# Patient Record
Sex: Female | Born: 1945 | Race: Black or African American | Hispanic: No | Smoking: Former smoker
Health system: Southern US, Community
[De-identification: ages and names within clinical notes are randomized; demographics above are authoritative.]

## PROBLEM LIST (undated history)

## (undated) DIAGNOSIS — I10 Essential (primary) hypertension: Secondary | ICD-10-CM

## (undated) DIAGNOSIS — R001 Bradycardia, unspecified: Secondary | ICD-10-CM

## (undated) DIAGNOSIS — F32A Depression, unspecified: Secondary | ICD-10-CM

## (undated) DIAGNOSIS — D649 Anemia, unspecified: Secondary | ICD-10-CM

## (undated) DIAGNOSIS — N189 Chronic kidney disease, unspecified: Secondary | ICD-10-CM

## (undated) DIAGNOSIS — I509 Heart failure, unspecified: Secondary | ICD-10-CM

## (undated) DIAGNOSIS — I513 Intracardiac thrombosis, not elsewhere classified: Secondary | ICD-10-CM

## (undated) DIAGNOSIS — Z950 Presence of cardiac pacemaker: Secondary | ICD-10-CM

## (undated) DIAGNOSIS — E210 Primary hyperparathyroidism: Secondary | ICD-10-CM

## (undated) DIAGNOSIS — L97509 Non-pressure chronic ulcer of other part of unspecified foot with unspecified severity: Secondary | ICD-10-CM

## (undated) DIAGNOSIS — I4821 Permanent atrial fibrillation: Secondary | ICD-10-CM

## (undated) DIAGNOSIS — I639 Cerebral infarction, unspecified: Secondary | ICD-10-CM

## (undated) DIAGNOSIS — G459 Transient cerebral ischemic attack, unspecified: Secondary | ICD-10-CM

## (undated) DIAGNOSIS — Z8719 Personal history of other diseases of the digestive system: Secondary | ICD-10-CM

## (undated) DIAGNOSIS — I050 Rheumatic mitral stenosis: Secondary | ICD-10-CM

## (undated) DIAGNOSIS — K219 Gastro-esophageal reflux disease without esophagitis: Secondary | ICD-10-CM

## (undated) DIAGNOSIS — I739 Peripheral vascular disease, unspecified: Secondary | ICD-10-CM

## (undated) DIAGNOSIS — E11621 Type 2 diabetes mellitus with foot ulcer: Secondary | ICD-10-CM

## (undated) DIAGNOSIS — E7800 Pure hypercholesterolemia, unspecified: Secondary | ICD-10-CM

## (undated) DIAGNOSIS — E119 Type 2 diabetes mellitus without complications: Secondary | ICD-10-CM

## (undated) DIAGNOSIS — Z8711 Personal history of peptic ulcer disease: Secondary | ICD-10-CM

## (undated) DIAGNOSIS — Z9289 Personal history of other medical treatment: Secondary | ICD-10-CM

## (undated) DIAGNOSIS — F329 Major depressive disorder, single episode, unspecified: Secondary | ICD-10-CM

## (undated) DIAGNOSIS — B009 Herpesviral infection, unspecified: Secondary | ICD-10-CM

## (undated) DIAGNOSIS — I350 Nonrheumatic aortic (valve) stenosis: Secondary | ICD-10-CM

## (undated) HISTORY — DX: Anemia, unspecified: D64.9

## (undated) HISTORY — PX: HERNIA REPAIR: SHX51

## (undated) HISTORY — PX: DIALYSIS FISTULA CREATION: SHX611

## (undated) HISTORY — DX: Peripheral vascular disease, unspecified: I73.9

## (undated) HISTORY — DX: Primary hyperparathyroidism: E21.0

## (undated) HISTORY — PX: UMBILICAL HERNIA REPAIR: SHX196

## (undated) HISTORY — DX: Herpesviral infection, unspecified: B00.9

## (undated) HISTORY — DX: Nonrheumatic aortic (valve) stenosis: I35.0

## (undated) HISTORY — PX: INSERT / REPLACE / REMOVE PACEMAKER: SUR710

## (undated) HISTORY — DX: Depression, unspecified: F32.A

## (undated) HISTORY — PX: APPENDECTOMY: SHX54

## (undated) HISTORY — DX: Permanent atrial fibrillation: I48.21

## (undated) HISTORY — DX: Major depressive disorder, single episode, unspecified: F32.9

## (undated) HISTORY — DX: Bradycardia, unspecified: R00.1

## (undated) HISTORY — PX: TONSILLECTOMY: SUR1361

---

## 1998-01-23 ENCOUNTER — Other Ambulatory Visit: Admission: RE | Admit: 1998-01-23 | Discharge: 1998-01-23 | Payer: Self-pay | Admitting: *Deleted

## 1998-01-31 ENCOUNTER — Ambulatory Visit (HOSPITAL_COMMUNITY): Admission: RE | Admit: 1998-01-31 | Discharge: 1998-01-31 | Payer: Self-pay | Admitting: Nephrology

## 1998-02-04 ENCOUNTER — Other Ambulatory Visit: Admission: RE | Admit: 1998-02-04 | Discharge: 1998-02-04 | Payer: Self-pay | Admitting: Nephrology

## 1998-02-04 ENCOUNTER — Ambulatory Visit (HOSPITAL_COMMUNITY): Admission: RE | Admit: 1998-02-04 | Discharge: 1998-02-04 | Payer: Self-pay | Admitting: Nephrology

## 1998-02-11 ENCOUNTER — Ambulatory Visit (HOSPITAL_COMMUNITY): Admission: RE | Admit: 1998-02-11 | Discharge: 1998-02-11 | Payer: Self-pay | Admitting: Critical Care Medicine

## 1998-02-11 ENCOUNTER — Emergency Department (HOSPITAL_COMMUNITY): Admission: EM | Admit: 1998-02-11 | Discharge: 1998-02-11 | Payer: Self-pay | Admitting: Emergency Medicine

## 1998-03-21 ENCOUNTER — Ambulatory Visit (HOSPITAL_COMMUNITY): Admission: RE | Admit: 1998-03-21 | Discharge: 1998-03-21 | Payer: Self-pay | Admitting: Thoracic Surgery

## 1998-06-11 ENCOUNTER — Ambulatory Visit (HOSPITAL_COMMUNITY): Admission: RE | Admit: 1998-06-11 | Discharge: 1998-06-11 | Payer: Self-pay | Admitting: Nephrology

## 1998-06-19 ENCOUNTER — Encounter (HOSPITAL_COMMUNITY): Admission: RE | Admit: 1998-06-19 | Discharge: 1998-09-17 | Payer: Self-pay | Admitting: Nephrology

## 1998-08-07 HISTORY — PX: KIDNEY TRANSPLANT: SHX239

## 1998-08-20 ENCOUNTER — Inpatient Hospital Stay (HOSPITAL_COMMUNITY): Admission: EM | Admit: 1998-08-20 | Discharge: 1998-08-20 | Payer: Self-pay | Admitting: Emergency Medicine

## 1999-01-16 ENCOUNTER — Emergency Department (HOSPITAL_COMMUNITY): Admission: EM | Admit: 1999-01-16 | Discharge: 1999-01-16 | Payer: Self-pay | Admitting: Emergency Medicine

## 1999-04-16 ENCOUNTER — Encounter: Admission: RE | Admit: 1999-04-16 | Discharge: 1999-07-15 | Payer: Self-pay | Admitting: Orthopedic Surgery

## 1999-06-30 ENCOUNTER — Encounter: Payer: Self-pay | Admitting: Nephrology

## 1999-06-30 ENCOUNTER — Encounter: Admission: RE | Admit: 1999-06-30 | Discharge: 1999-06-30 | Payer: Self-pay | Admitting: Critical Care Medicine

## 1999-08-20 ENCOUNTER — Encounter: Payer: Self-pay | Admitting: Nephrology

## 1999-08-20 ENCOUNTER — Ambulatory Visit (HOSPITAL_COMMUNITY): Admission: RE | Admit: 1999-08-20 | Discharge: 1999-08-20 | Payer: Self-pay | Admitting: Critical Care Medicine

## 1999-12-18 ENCOUNTER — Emergency Department (HOSPITAL_COMMUNITY): Admission: EM | Admit: 1999-12-18 | Discharge: 1999-12-18 | Payer: Self-pay | Admitting: Emergency Medicine

## 1999-12-18 ENCOUNTER — Encounter: Payer: Self-pay | Admitting: Emergency Medicine

## 2000-06-29 ENCOUNTER — Encounter: Payer: Self-pay | Admitting: Nephrology

## 2000-06-29 ENCOUNTER — Encounter: Admission: RE | Admit: 2000-06-29 | Discharge: 2000-06-29 | Payer: Self-pay | Admitting: Nephrology

## 2000-10-17 ENCOUNTER — Emergency Department (HOSPITAL_COMMUNITY): Admission: EM | Admit: 2000-10-17 | Discharge: 2000-10-17 | Payer: Self-pay | Admitting: Emergency Medicine

## 2000-10-20 ENCOUNTER — Encounter: Admission: RE | Admit: 2000-10-20 | Discharge: 2001-01-18 | Payer: Self-pay | Admitting: Nephrology

## 2001-04-14 ENCOUNTER — Encounter: Admission: RE | Admit: 2001-04-14 | Discharge: 2001-04-14 | Payer: Self-pay | Admitting: Nephrology

## 2001-04-14 ENCOUNTER — Encounter: Payer: Self-pay | Admitting: Nephrology

## 2001-04-26 ENCOUNTER — Encounter: Admission: RE | Admit: 2001-04-26 | Discharge: 2001-04-26 | Payer: Self-pay | Admitting: Nephrology

## 2001-04-26 ENCOUNTER — Encounter: Payer: Self-pay | Admitting: Nephrology

## 2001-11-13 ENCOUNTER — Emergency Department (HOSPITAL_COMMUNITY): Admission: EM | Admit: 2001-11-13 | Discharge: 2001-11-13 | Payer: Self-pay | Admitting: Emergency Medicine

## 2001-11-13 ENCOUNTER — Encounter: Payer: Self-pay | Admitting: Emergency Medicine

## 2001-11-28 ENCOUNTER — Encounter: Admission: RE | Admit: 2001-11-28 | Discharge: 2001-11-28 | Payer: Self-pay | Admitting: Nephrology

## 2001-11-28 ENCOUNTER — Encounter: Payer: Self-pay | Admitting: Nephrology

## 2001-12-29 ENCOUNTER — Other Ambulatory Visit: Admission: RE | Admit: 2001-12-29 | Discharge: 2001-12-29 | Payer: Self-pay | Admitting: *Deleted

## 2002-11-27 ENCOUNTER — Encounter: Payer: Self-pay | Admitting: Nephrology

## 2002-11-27 ENCOUNTER — Encounter: Admission: RE | Admit: 2002-11-27 | Discharge: 2002-11-27 | Payer: Self-pay | Admitting: Nephrology

## 2002-12-26 ENCOUNTER — Encounter: Payer: Self-pay | Admitting: Emergency Medicine

## 2002-12-26 ENCOUNTER — Inpatient Hospital Stay (HOSPITAL_COMMUNITY): Admission: EM | Admit: 2002-12-26 | Discharge: 2002-12-27 | Payer: Self-pay | Admitting: Emergency Medicine

## 2003-09-20 ENCOUNTER — Inpatient Hospital Stay (HOSPITAL_COMMUNITY): Admission: EM | Admit: 2003-09-20 | Discharge: 2003-09-22 | Payer: Self-pay | Admitting: Emergency Medicine

## 2004-04-28 ENCOUNTER — Ambulatory Visit (HOSPITAL_COMMUNITY): Admission: RE | Admit: 2004-04-28 | Discharge: 2004-04-28 | Payer: Self-pay | Admitting: Nephrology

## 2004-06-05 ENCOUNTER — Encounter: Admission: RE | Admit: 2004-06-05 | Discharge: 2004-06-05 | Payer: Self-pay | Admitting: Otolaryngology

## 2004-06-12 ENCOUNTER — Encounter: Admission: RE | Admit: 2004-06-12 | Discharge: 2004-06-12 | Payer: Self-pay | Admitting: Nephrology

## 2005-02-23 ENCOUNTER — Encounter: Admission: RE | Admit: 2005-02-23 | Discharge: 2005-02-23 | Payer: Self-pay | Admitting: Nephrology

## 2005-07-23 ENCOUNTER — Ambulatory Visit (HOSPITAL_COMMUNITY): Admission: RE | Admit: 2005-07-23 | Discharge: 2005-07-23 | Payer: Self-pay | Admitting: Nephrology

## 2005-09-16 ENCOUNTER — Encounter: Admission: RE | Admit: 2005-09-16 | Discharge: 2005-09-16 | Payer: Self-pay | Admitting: Nephrology

## 2005-11-08 ENCOUNTER — Emergency Department (HOSPITAL_COMMUNITY): Admission: EM | Admit: 2005-11-08 | Discharge: 2005-11-08 | Payer: Self-pay | Admitting: Emergency Medicine

## 2006-01-06 ENCOUNTER — Encounter: Payer: Self-pay | Admitting: Emergency Medicine

## 2006-02-15 ENCOUNTER — Emergency Department (HOSPITAL_COMMUNITY): Admission: EM | Admit: 2006-02-15 | Discharge: 2006-02-15 | Payer: Self-pay | Admitting: Emergency Medicine

## 2006-05-13 ENCOUNTER — Encounter: Admission: RE | Admit: 2006-05-13 | Discharge: 2006-05-13 | Payer: Self-pay | Admitting: Nephrology

## 2006-08-02 ENCOUNTER — Ambulatory Visit (HOSPITAL_COMMUNITY): Admission: RE | Admit: 2006-08-02 | Discharge: 2006-08-02 | Payer: Self-pay | Admitting: Nephrology

## 2007-02-17 ENCOUNTER — Encounter: Admission: RE | Admit: 2007-02-17 | Discharge: 2007-02-17 | Payer: Self-pay | Admitting: Nephrology

## 2007-08-09 ENCOUNTER — Encounter: Admission: RE | Admit: 2007-08-09 | Discharge: 2007-08-09 | Payer: Self-pay | Admitting: Nephrology

## 2007-09-02 ENCOUNTER — Encounter: Admission: RE | Admit: 2007-09-02 | Discharge: 2007-09-02 | Payer: Self-pay | Admitting: Nephrology

## 2007-11-18 ENCOUNTER — Encounter: Admission: RE | Admit: 2007-11-18 | Discharge: 2007-11-18 | Payer: Self-pay | Admitting: Nephrology

## 2008-01-15 ENCOUNTER — Ambulatory Visit: Payer: Self-pay | Admitting: Vascular Surgery

## 2008-01-15 ENCOUNTER — Emergency Department (HOSPITAL_COMMUNITY): Admission: EM | Admit: 2008-01-15 | Discharge: 2008-01-15 | Payer: Self-pay | Admitting: Emergency Medicine

## 2008-01-15 ENCOUNTER — Encounter (INDEPENDENT_AMBULATORY_CARE_PROVIDER_SITE_OTHER): Payer: Self-pay | Admitting: Emergency Medicine

## 2008-04-27 ENCOUNTER — Emergency Department (HOSPITAL_COMMUNITY): Admission: EM | Admit: 2008-04-27 | Discharge: 2008-04-27 | Payer: Self-pay | Admitting: Emergency Medicine

## 2008-05-01 ENCOUNTER — Inpatient Hospital Stay (HOSPITAL_COMMUNITY): Admission: AD | Admit: 2008-05-01 | Discharge: 2008-05-09 | Payer: Self-pay | Admitting: Nephrology

## 2008-05-02 ENCOUNTER — Encounter (INDEPENDENT_AMBULATORY_CARE_PROVIDER_SITE_OTHER): Payer: Self-pay | Admitting: Nephrology

## 2008-05-04 ENCOUNTER — Encounter (INDEPENDENT_AMBULATORY_CARE_PROVIDER_SITE_OTHER): Payer: Self-pay | Admitting: Nephrology

## 2008-05-09 ENCOUNTER — Encounter (INDEPENDENT_AMBULATORY_CARE_PROVIDER_SITE_OTHER): Payer: Self-pay | Admitting: Nephrology

## 2008-06-26 ENCOUNTER — Ambulatory Visit: Payer: Self-pay | Admitting: Vascular Surgery

## 2008-06-28 ENCOUNTER — Ambulatory Visit: Payer: Self-pay | Admitting: Vascular Surgery

## 2008-07-03 ENCOUNTER — Ambulatory Visit: Payer: Self-pay | Admitting: Vascular Surgery

## 2008-07-06 ENCOUNTER — Ambulatory Visit (HOSPITAL_COMMUNITY): Admission: RE | Admit: 2008-07-06 | Discharge: 2008-07-06 | Payer: Self-pay | Admitting: Vascular Surgery

## 2008-07-06 ENCOUNTER — Ambulatory Visit: Payer: Self-pay | Admitting: Vascular Surgery

## 2008-07-10 ENCOUNTER — Ambulatory Visit: Payer: Self-pay | Admitting: Vascular Surgery

## 2008-08-16 ENCOUNTER — Ambulatory Visit (HOSPITAL_COMMUNITY): Admission: RE | Admit: 2008-08-16 | Discharge: 2008-08-16 | Payer: Self-pay | Admitting: *Deleted

## 2008-08-16 ENCOUNTER — Encounter (INDEPENDENT_AMBULATORY_CARE_PROVIDER_SITE_OTHER): Payer: Self-pay | Admitting: *Deleted

## 2008-10-16 ENCOUNTER — Encounter: Admission: RE | Admit: 2008-10-16 | Discharge: 2008-10-16 | Payer: Self-pay | Admitting: Nephrology

## 2008-11-12 ENCOUNTER — Encounter: Admission: RE | Admit: 2008-11-12 | Discharge: 2008-11-12 | Payer: Self-pay | Admitting: Nephrology

## 2008-12-04 ENCOUNTER — Encounter: Admission: RE | Admit: 2008-12-04 | Discharge: 2008-12-04 | Payer: Self-pay | Admitting: Podiatry

## 2008-12-07 ENCOUNTER — Ambulatory Visit (HOSPITAL_COMMUNITY): Admission: RE | Admit: 2008-12-07 | Discharge: 2008-12-07 | Payer: Self-pay | Admitting: Interventional Radiology

## 2008-12-20 ENCOUNTER — Ambulatory Visit (HOSPITAL_COMMUNITY): Admission: RE | Admit: 2008-12-20 | Discharge: 2008-12-20 | Payer: Self-pay | Admitting: Interventional Radiology

## 2009-01-09 ENCOUNTER — Encounter: Admission: RE | Admit: 2009-01-09 | Discharge: 2009-01-09 | Payer: Self-pay | Admitting: Interventional Radiology

## 2009-06-17 ENCOUNTER — Encounter: Admission: RE | Admit: 2009-06-17 | Discharge: 2009-06-17 | Payer: Self-pay | Admitting: Nephrology

## 2009-08-07 DIAGNOSIS — G459 Transient cerebral ischemic attack, unspecified: Secondary | ICD-10-CM

## 2009-08-07 HISTORY — DX: Transient cerebral ischemic attack, unspecified: G45.9

## 2009-08-18 ENCOUNTER — Inpatient Hospital Stay (HOSPITAL_COMMUNITY): Admission: EM | Admit: 2009-08-18 | Discharge: 2009-08-22 | Payer: Self-pay | Admitting: Emergency Medicine

## 2009-08-19 ENCOUNTER — Ambulatory Visit: Payer: Self-pay | Admitting: Vascular Surgery

## 2009-08-19 ENCOUNTER — Encounter (INDEPENDENT_AMBULATORY_CARE_PROVIDER_SITE_OTHER): Payer: Self-pay | Admitting: Internal Medicine

## 2009-10-21 ENCOUNTER — Encounter: Payer: Self-pay | Admitting: Internal Medicine

## 2010-05-07 ENCOUNTER — Ambulatory Visit: Payer: Self-pay | Admitting: *Deleted

## 2010-05-21 ENCOUNTER — Encounter: Admission: RE | Admit: 2010-05-21 | Discharge: 2010-05-21 | Payer: Self-pay | Admitting: Nephrology

## 2010-06-05 ENCOUNTER — Ambulatory Visit: Payer: Self-pay | Admitting: Cardiology

## 2010-06-14 ENCOUNTER — Encounter: Payer: Self-pay | Admitting: Internal Medicine

## 2010-06-19 ENCOUNTER — Ambulatory Visit: Payer: Self-pay | Admitting: Cardiology

## 2010-07-17 ENCOUNTER — Ambulatory Visit: Payer: Self-pay | Admitting: Cardiology

## 2010-07-30 ENCOUNTER — Ambulatory Visit: Payer: Self-pay | Admitting: Cardiology

## 2010-08-06 ENCOUNTER — Ambulatory Visit: Payer: Self-pay | Admitting: Internal Medicine

## 2010-08-06 DIAGNOSIS — F329 Major depressive disorder, single episode, unspecified: Secondary | ICD-10-CM

## 2010-08-06 DIAGNOSIS — I4891 Unspecified atrial fibrillation: Secondary | ICD-10-CM

## 2010-08-06 DIAGNOSIS — B009 Herpesviral infection, unspecified: Secondary | ICD-10-CM | POA: Insufficient documentation

## 2010-08-06 DIAGNOSIS — I519 Heart disease, unspecified: Secondary | ICD-10-CM

## 2010-08-06 DIAGNOSIS — I739 Peripheral vascular disease, unspecified: Secondary | ICD-10-CM

## 2010-08-06 DIAGNOSIS — I6350 Cerebral infarction due to unspecified occlusion or stenosis of unspecified cerebral artery: Secondary | ICD-10-CM

## 2010-08-06 DIAGNOSIS — Z950 Presence of cardiac pacemaker: Secondary | ICD-10-CM

## 2010-08-06 DIAGNOSIS — E210 Primary hyperparathyroidism: Secondary | ICD-10-CM

## 2010-08-06 DIAGNOSIS — I495 Sick sinus syndrome: Secondary | ICD-10-CM

## 2010-08-06 DIAGNOSIS — I050 Rheumatic mitral stenosis: Secondary | ICD-10-CM

## 2010-08-06 DIAGNOSIS — D649 Anemia, unspecified: Secondary | ICD-10-CM

## 2010-08-06 DIAGNOSIS — N186 End stage renal disease: Secondary | ICD-10-CM

## 2010-08-07 DIAGNOSIS — I639 Cerebral infarction, unspecified: Secondary | ICD-10-CM

## 2010-08-07 HISTORY — DX: Cerebral infarction, unspecified: I63.9

## 2010-09-10 ENCOUNTER — Ambulatory Visit: Payer: Self-pay | Admitting: Cardiology

## 2010-09-27 ENCOUNTER — Encounter: Payer: Self-pay | Admitting: Nephrology

## 2010-09-28 ENCOUNTER — Encounter: Payer: Self-pay | Admitting: Interventional Radiology

## 2010-09-28 ENCOUNTER — Encounter: Payer: Self-pay | Admitting: Podiatry

## 2010-09-28 ENCOUNTER — Encounter: Payer: Self-pay | Admitting: Nephrology

## 2010-09-29 ENCOUNTER — Encounter: Payer: Self-pay | Admitting: Nephrology

## 2010-10-07 NOTE — Letter (Signed)
Summary: [facility] Progress Note   [facility] Progress Note   Imported By: Roderic Ovens 08/11/2010 14:01:23  _____________________________________________________________________  External Attachment:    Type:   Image     Comment:   External Document

## 2010-10-07 NOTE — Cardiovascular Report (Signed)
Summary: Office Visit   Office Visit   Imported By: Roderic Ovens 08/12/2010 11:02:15  _____________________________________________________________________  External Attachment:    Type:   Image     Comment:   External Document

## 2010-10-07 NOTE — Assessment & Plan Note (Signed)
Summary: pacer check/medtronic   Visit Type:  Initial Consult Referring Provider:  Dr Deborah Chalk Primary Provider:  Elvis Coil, MD   History of Present Illness: Ms Tatlock is a pleasant 65 yo AAF with a h/o valvular heart disease, CRI, and permanent atrial fibrillation s/p PPM for symptomatic bradycardia who presents to establish EP care.  She has a longstanding h/o valvular heart disease for which she has been followed by Dr Deborah Chalk.  She presented 12/10 following a stroke and then underwent PPM for symptomatic bradycardia with pauses upto 3 seconds.  She has done well since that time.  She is felt to not be a surgical candidate for her valvular heart disease.Presently, she denies symptoms of palpitations, chest pain, shortness of breath, orthopnea, PND, lower extremity edema, dizziness, presyncope, syncope, or neurologic sequela. The patient is tolerating medications without difficulties and is otherwise without complaint today.   Current Medications (verified): 1)  Nearol 25 Mg .... Two Times A Day 2)  Cellcept 250 Mg Caps (Mycophenolate Mofetil) .... Two Times A Day 3)  Prednisone 5 Mg Tabs (Prednisone) .... Uad 4)  Levemir 100 Unit/ml Soln (Insulin Detemir) .... Uad 5)  Mil-A-Mulsion 10-15000 Unit/drop Emul (Multiple Vitamin) .... Uad 6)  Caltrate .... Once Daily 7)  Furosemide 40 Mg Tabs (Furosemide) .... As Needed 8)  Januvia 100 Mg Tabs (Sitagliptin Phosphate) .... Once Daily 9)  Digoxin 0.25 Mg Tabs (Digoxin) .... Once Daily 10)  Warfarin Sodium 5 Mg Tabs (Warfarin Sodium) .... Use As Directed By Anticoagulation Clinic 11)  Metoprolol Tartrate 50 Mg Tabs (Metoprolol Tartrate) .... Take One Tablet By Mouth Three Times A Day  Allergies (verified): 1)  ! 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   Vital Signs:  Patient profile:   65 year old female Height:      60 inches Weight:      99 pounds BMI:     19.40 Pulse rate:   74 / minute BP sitting:   120 / 88  Vitals Entered By: Laurance Flatten CMA (August 06, 2010 9:19 AM)  Physical Exam  General:  thin and chronically ill Head:  normocephalic and atraumatic Eyes:  PERRLA/EOM intact; conjunctiva and lids normal. Mouth:  Teeth, gums and palate normal. Oral mucosa normal. Neck:  supple Chest Wall:  R sided pacemaker site is well healed Lungs:  Clear bilaterally to auscultation and percussion. Heart:  RRR (Paced) Abdomen:  Bowel sounds positive; abdomen soft and non-tender without masses, organomegaly, or hernias noted. No hepatosplenomegaly. Msk:  Back normal, normal gait. Muscle strength and tone normal. Pulses:  pulses normal in all 4 extremities Extremities:  No clubbing or cyanosis. Neurologic:  Alert and oriented x 3.   PPM Specifications Following MD:  Hillis Range, MD     Referring MD:  Rolla Plate PPM Vendor:  Medtronic     PPM Model Number:  [id number]     PPM Serial Number:  [id number] PPM DOI:  08/20/2009  PPM Implanting MD:  Roger Shelter, MD  Lead 1    Location: RV     DOI: 08/20/2009     Model #: [id number]     Serial #: [id number]     Status: active  Magnet Response Rate:  BOL 85 ERI 65  Indications:  A-fib   PPM Follow Up Battery Voltage:  2.79 V     Battery Est. Longevity:  10 yrs     Right Ventricle  Amplitude: 22.4 mV, Impedance: 586 ohms, Threshold: 0.750 V at 0.40  msec  Episodes Ventricular High Rate:  8234     Ventricular Pacing:  37%  Parameters Mode:  VVIR     Lower Rate Limit:  50     Upper Rate Limit:  130 Next Remote Date:  11/06/2010     Next Cardiology Appt Due:  07/09/2011 Tech Comments:  [place] [facility] PT---8234 VHR EPISODES--LONGEST WAS 2 MINUTES 1 SECOND.  NORMAL DEVICE FUNCTION.  NO CHANGES MADE. PT TO BE ENROLLED IN CARELINK.  CARELINK TRANSMISSION 11-06-10 AND ROV IN 12 MTHS W/JA. Vella Kohler  August 06, 2010 9:24 AM MD Comments:  agree  Impression & Recommendations:  Problem # 1:  ATRIAL FIBRILLATION (ICD-427.31) Ms Volkert has permanent valvular atrial fibrillation.  She is doing well s/p PPM. She is appropriately anticoagulated with coumadin. Today, she inquires about pradaxa or xeralto.  I have informed her that with valvular heart disease, particularly severe MS, she is not a candidate for these new medicines at this time.  NO changes today  Problem # 2:  BRADYCARDIA-TACHYCARDIA SYNDROME (ICD-427.81) normal pacemaker function as above  Problem # 3:  MITRAL STENOSIS (ICD-394.0) per Dr Deborah Chalk, she is not a surgical candidate presently  Problem # 4:  DIASTOLIC DYSFUNCTION (ICD-429.9) stable  Patient Instructions: 1)  Your physician wants you to follow-up in:  12 months with Dr Jacquiline Doe will receive a reminder letter in the mail two months in advance. If you don't receive a letter, please call our office to schedule the follow-up appointment. 2)  Set up Carelinks at home

## 2010-10-07 NOTE — Miscellaneous (Signed)
Summary: Device preload  Clinical Lists Changes  Observations: Added new observation of PPM INDICATN: A-fib (06/14/2010 16:01) Added new observation of MAGNET RTE: BOL 85 ERI 65 (06/14/2010 16:01) Added new observation of PPMLEADSTAT1: active (06/14/2010 16:01) Added new observation of PPMLEADSER1: [id number]  (06/14/2010 16:01) Added new observation of PPMLEADMOD1: 4470  (06/14/2010 16:01) Added new observation of PPMLEADDOI1: 08/20/2009  (06/14/2010 16:01) Added new observation of PPMLEADLOC1: RV  (06/14/2010 16:01) Added new observation of PPM IMP MD: Roger Shelter, MD  (06/14/2010 16:01) Added new observation of PPM DOI: 08/20/2009  (06/14/2010 16:01) Added new observation of PPM SERL#: [id number]  (06/14/2010 16:01) Added new observation of PPM MODL#: [id number]  (06/14/2010 41:32) Added new observation of PACEMAKERMFG: Medtronic  (06/14/2010 16:01) Added new observation of PPM REFER MD: Duffy Rhody Tennant,MD  (06/14/2010 16:01) Added new observation of PACEMAKER MD: Hillis Range, MD  (06/14/2010 16:01)      PPM Specifications Following MD:  Hillis Range, MD     Referring MD:  Rolla Plate PPM Vendor:  Medtronic     PPM Model Number:  [id number]     PPM Serial Number:  [id number] H PPM DOI:  08/20/2009     PPM Implanting MD:  Roger Shelter, MD  Lead 1    Location: RV     DOI: 08/20/2009     Model #: [id number]     Serial #: [id number]     Status: active  Magnet Response Rate:  BOL 85 ERI 65  Indications:  A-fib

## 2010-10-09 ENCOUNTER — Ambulatory Visit (INDEPENDENT_AMBULATORY_CARE_PROVIDER_SITE_OTHER): Payer: Medicare Other | Admitting: Cardiology

## 2010-10-09 DIAGNOSIS — Z7901 Long term (current) use of anticoagulants: Secondary | ICD-10-CM

## 2010-10-09 DIAGNOSIS — I4891 Unspecified atrial fibrillation: Secondary | ICD-10-CM

## 2010-10-24 ENCOUNTER — Other Ambulatory Visit: Payer: Medicare Other

## 2010-10-27 ENCOUNTER — Other Ambulatory Visit: Payer: Medicare Other

## 2010-10-28 ENCOUNTER — Encounter (INDEPENDENT_AMBULATORY_CARE_PROVIDER_SITE_OTHER): Payer: Medicare Other

## 2010-10-28 DIAGNOSIS — Z7901 Long term (current) use of anticoagulants: Secondary | ICD-10-CM

## 2010-10-28 DIAGNOSIS — I4891 Unspecified atrial fibrillation: Secondary | ICD-10-CM

## 2010-11-04 ENCOUNTER — Other Ambulatory Visit: Payer: Self-pay | Admitting: Nephrology

## 2010-11-04 ENCOUNTER — Ambulatory Visit
Admission: RE | Admit: 2010-11-04 | Discharge: 2010-11-04 | Disposition: A | Discharge status: Home / Self Care | Payer: Medicare Other | Source: Ambulatory Visit | Attending: Nephrology | Admitting: Nephrology

## 2010-11-04 DIAGNOSIS — R05 Cough: Secondary | ICD-10-CM

## 2010-11-07 ENCOUNTER — Other Ambulatory Visit: Payer: Medicare Other

## 2010-11-09 ENCOUNTER — Encounter (INDEPENDENT_AMBULATORY_CARE_PROVIDER_SITE_OTHER): Payer: Self-pay | Admitting: *Deleted

## 2010-11-18 NOTE — Letter (Signed)
Summary: Device-Delinquent Phone Journalist, newspaper, Main Office  [address]   [place], [place] [address]   Phone: [phone]  Fax: [phone]     November 09, 2010 MRN: [id number]   [name] [name] [address] [facility] [address] [place], [place]  [address]   Dear [name] [name],  According to our records, you were scheduled for a device phone transmission on 11-06-10.     We did not receive any results from this check.  If you transmitted on your scheduled day, please call us to help troubleshoot your system.  If you forgot to send your transmission, please send one upon receipt of this letter.  Thank you,   Architectural technologist [facility]

## 2010-11-20 ENCOUNTER — Other Ambulatory Visit: Payer: Self-pay | Admitting: Family Medicine

## 2010-11-20 DIAGNOSIS — Z780 Asymptomatic menopausal state: Secondary | ICD-10-CM

## 2010-11-25 ENCOUNTER — Other Ambulatory Visit: Payer: Medicare Other

## 2010-12-02 ENCOUNTER — Ambulatory Visit (INDEPENDENT_AMBULATORY_CARE_PROVIDER_SITE_OTHER): Payer: Medicare Other | Admitting: *Deleted

## 2010-12-02 DIAGNOSIS — Z7901 Long term (current) use of anticoagulants: Secondary | ICD-10-CM

## 2010-12-02 DIAGNOSIS — I4891 Unspecified atrial fibrillation: Secondary | ICD-10-CM

## 2010-12-03 ENCOUNTER — Ambulatory Visit
Admission: RE | Admit: 2010-12-03 | Discharge: 2010-12-03 | Disposition: A | Discharge status: Home / Self Care | Payer: Medicare Other | Source: Ambulatory Visit | Attending: Family Medicine | Admitting: Family Medicine

## 2010-12-03 DIAGNOSIS — Z780 Asymptomatic menopausal state: Secondary | ICD-10-CM

## 2010-12-04 ENCOUNTER — Ambulatory Visit: Payer: Medicare Other | Admitting: Dietician

## 2010-12-08 LAB — PROTIME-INR
INR: 1.94 — ABNORMAL HIGH (ref 0.00–1.49)
Prothrombin Time: 22 seconds — ABNORMAL HIGH (ref 11.6–15.2)

## 2010-12-08 LAB — BASIC METABOLIC PANEL
BUN: 25 mg/dL — ABNORMAL HIGH (ref 6–23)
CO2: 24 mEq/L (ref 19–32)
Calcium: 8.8 mg/dL (ref 8.4–10.5)
Creatinine, Ser: 1.09 mg/dL (ref 0.4–1.2)
Glucose, Bld: 104 mg/dL — ABNORMAL HIGH (ref 70–99)

## 2010-12-08 LAB — CBC
MCHC: 33.5 g/dL (ref 30.0–36.0)
Platelets: 179 10*3/uL (ref 150–400)
RDW: 16.5 % — ABNORMAL HIGH (ref 11.5–15.5)

## 2010-12-09 LAB — LIPID PANEL
Cholesterol: 221 mg/dL — ABNORMAL HIGH (ref 0–200)
LDL Cholesterol: 91 mg/dL (ref 0–99)
Total CHOL/HDL Ratio: 2 RATIO
VLDL: 17 mg/dL (ref 0–40)

## 2010-12-09 LAB — PROTIME-INR
INR: 1.34 (ref 0.00–1.49)
INR: 1.43 (ref 0.00–1.49)
INR: 1.83 — ABNORMAL HIGH (ref 0.00–1.49)
Prothrombin Time: 16.5 seconds — ABNORMAL HIGH (ref 11.6–15.2)
Prothrombin Time: 19.8 seconds — ABNORMAL HIGH (ref 11.6–15.2)
Prothrombin Time: 21 seconds — ABNORMAL HIGH (ref 11.6–15.2)

## 2010-12-09 LAB — GLUCOSE, CAPILLARY
Glucose-Capillary: 110 mg/dL — ABNORMAL HIGH (ref 70–99)
Glucose-Capillary: 120 mg/dL — ABNORMAL HIGH (ref 70–99)
Glucose-Capillary: 122 mg/dL — ABNORMAL HIGH (ref 70–99)
Glucose-Capillary: 132 mg/dL — ABNORMAL HIGH (ref 70–99)
Glucose-Capillary: 148 mg/dL — ABNORMAL HIGH (ref 70–99)
Glucose-Capillary: 163 mg/dL — ABNORMAL HIGH (ref 70–99)
Glucose-Capillary: 222 mg/dL — ABNORMAL HIGH (ref 70–99)
Glucose-Capillary: 93 mg/dL (ref 70–99)
Glucose-Capillary: 93 mg/dL (ref 70–99)

## 2010-12-09 LAB — CBC
HCT: 36.4 % (ref 36.0–46.0)
HCT: 37.3 % (ref 36.0–46.0)
Hemoglobin: 12.3 g/dL (ref 12.0–15.0)
Hemoglobin: 12.8 g/dL (ref 12.0–15.0)
Hemoglobin: 13.8 g/dL (ref 12.0–15.0)
MCHC: 33.5 g/dL (ref 30.0–36.0)
MCHC: 33.9 g/dL (ref 30.0–36.0)
MCV: 102.3 fL — ABNORMAL HIGH (ref 78.0–100.0)
MCV: 102.4 fL — ABNORMAL HIGH (ref 78.0–100.0)
Platelets: 170 10*3/uL (ref 150–400)
RBC: 3.55 MIL/uL — ABNORMAL LOW (ref 3.87–5.11)
RBC: 3.62 MIL/uL — ABNORMAL LOW (ref 3.87–5.11)
RBC: 3.68 MIL/uL — ABNORMAL LOW (ref 3.87–5.11)
RBC: 4.03 MIL/uL (ref 3.87–5.11)
WBC: 4.7 10*3/uL (ref 4.0–10.5)

## 2010-12-09 LAB — URINALYSIS, MICROSCOPIC ONLY
Glucose, UA: NEGATIVE mg/dL
Hgb urine dipstick: NEGATIVE
Protein, ur: NEGATIVE mg/dL
Specific Gravity, Urine: 1.017 (ref 1.005–1.030)
Urobilinogen, UA: 0.2 mg/dL (ref 0.0–1.0)

## 2010-12-09 LAB — DIFFERENTIAL
Basophils Absolute: 0 10*3/uL (ref 0.0–0.1)
Eosinophils Absolute: 0.1 10*3/uL (ref 0.0–0.7)
Lymphs Abs: 2.3 10*3/uL (ref 0.7–4.0)
Neutrophils Relative %: 55 % (ref 43–77)

## 2010-12-09 LAB — TROPONIN I: Troponin I: 0.04 ng/mL (ref 0.00–0.06)

## 2010-12-09 LAB — BASIC METABOLIC PANEL
BUN: 27 mg/dL — ABNORMAL HIGH (ref 6–23)
CO2: 25 mEq/L (ref 19–32)
Calcium: 9.2 mg/dL (ref 8.4–10.5)
Chloride: 111 mEq/L (ref 96–112)
Creatinine, Ser: 1.1 mg/dL (ref 0.4–1.2)
Creatinine, Ser: 1.25 mg/dL — ABNORMAL HIGH (ref 0.4–1.2)
GFR calc Af Amer: 53 mL/min — ABNORMAL LOW (ref >60)
GFR calc Af Amer: >60 mL/min (ref >60)
Potassium: 4 mEq/L (ref 3.5–5.1)

## 2010-12-09 LAB — COMPREHENSIVE METABOLIC PANEL
ALT: 26 U/L (ref 0–35)
CO2: 27 mEq/L (ref 19–32)
Calcium: 10.2 mg/dL (ref 8.4–10.5)
Chloride: 105 mEq/L (ref 96–112)
Creatinine, Ser: 1.41 mg/dL — ABNORMAL HIGH (ref 0.4–1.2)
GFR calc non Af Amer: 38 mL/min — ABNORMAL LOW (ref >60)
Glucose, Bld: 57 mg/dL — ABNORMAL LOW (ref 70–99)
Sodium: 143 mEq/L (ref 135–145)
Total Bilirubin: 1.1 mg/dL (ref 0.3–1.2)

## 2010-12-09 LAB — CK TOTAL AND CKMB (NOT AT ARMC)
CK, MB: 1.4 ng/mL (ref 0.3–4.0)
Total CK: 83 U/L (ref 7–177)

## 2010-12-09 LAB — CARDIAC PANEL(CRET KIN+CKTOT+MB+TROPI)
CK, MB: 1.5 ng/mL (ref 0.3–4.0)
Relative Index: 1.3 (ref 0.0–2.5)
Relative Index: INVALID (ref 0.0–2.5)
Total CK: 112 U/L (ref 7–177)
Troponin I: 0.02 ng/mL (ref 0.00–0.06)

## 2010-12-09 LAB — POCT CARDIAC MARKERS: Troponin i, poc: <0.05 ng/mL (ref 0.00–0.09)

## 2010-12-09 LAB — POCT I-STAT, CHEM 8
Calcium, Ion: 1.12 mmol/L (ref 1.12–1.32)
Chloride: 109 mEq/L (ref 96–112)
Glucose, Bld: 57 mg/dL — ABNORMAL LOW (ref 70–99)
HCT: 40 % (ref 36.0–46.0)
Hemoglobin: 13.6 g/dL (ref 12.0–15.0)
TCO2: 26 mmol/L (ref 0–100)

## 2010-12-09 LAB — VITAMIN B12: Vitamin B-12: 532 pg/mL (ref 211–911)

## 2010-12-15 ENCOUNTER — Ambulatory Visit (INDEPENDENT_AMBULATORY_CARE_PROVIDER_SITE_OTHER): Payer: Medicare Other | Admitting: *Deleted

## 2010-12-15 DIAGNOSIS — Z7901 Long term (current) use of anticoagulants: Secondary | ICD-10-CM

## 2010-12-15 DIAGNOSIS — I4891 Unspecified atrial fibrillation: Secondary | ICD-10-CM

## 2010-12-15 LAB — POCT INR: INR: 2.2

## 2010-12-16 ENCOUNTER — Encounter: Payer: Medicare Other | Admitting: *Deleted

## 2010-12-17 LAB — CBC
HCT: 36.8 % (ref 36.0–46.0)
MCHC: 33.5 g/dL (ref 30.0–36.0)
MCV: 99.9 fL (ref 78.0–100.0)
RBC: 3.69 MIL/uL — ABNORMAL LOW (ref 3.87–5.11)
WBC: 5.2 10*3/uL (ref 4.0–10.5)

## 2010-12-17 LAB — GLUCOSE, CAPILLARY
Glucose-Capillary: 107 mg/dL — ABNORMAL HIGH (ref 70–99)
Glucose-Capillary: 37 mg/dL — CL (ref 70–99)
Glucose-Capillary: 58 mg/dL — ABNORMAL LOW (ref 70–99)
Glucose-Capillary: 67 mg/dL — ABNORMAL LOW (ref 70–99)

## 2010-12-17 LAB — BASIC METABOLIC PANEL
BUN: 37 mg/dL — ABNORMAL HIGH (ref 6–23)
CO2: 28 mEq/L (ref 19–32)
Chloride: 105 mEq/L (ref 96–112)
Creatinine, Ser: 1.64 mg/dL — ABNORMAL HIGH (ref 0.4–1.2)
GFR calc Af Amer: 39 mL/min — ABNORMAL LOW (ref >60)
Potassium: 3.3 mEq/L — ABNORMAL LOW (ref 3.5–5.1)

## 2010-12-17 LAB — PROTIME-INR: Prothrombin Time: 19.9 seconds — ABNORMAL HIGH (ref 11.6–15.2)

## 2010-12-22 ENCOUNTER — Other Ambulatory Visit: Payer: Self-pay | Admitting: Cardiology

## 2010-12-22 DIAGNOSIS — I4891 Unspecified atrial fibrillation: Secondary | ICD-10-CM

## 2010-12-22 DIAGNOSIS — I509 Heart failure, unspecified: Secondary | ICD-10-CM

## 2010-12-30 ENCOUNTER — Ambulatory Visit (INDEPENDENT_AMBULATORY_CARE_PROVIDER_SITE_OTHER): Payer: Medicare Other | Admitting: *Deleted

## 2010-12-30 ENCOUNTER — Other Ambulatory Visit: Payer: Self-pay | Admitting: Internal Medicine

## 2010-12-30 DIAGNOSIS — Z950 Presence of cardiac pacemaker: Secondary | ICD-10-CM

## 2010-12-30 DIAGNOSIS — I4891 Unspecified atrial fibrillation: Secondary | ICD-10-CM

## 2011-01-11 NOTE — Progress Notes (Signed)
Pacer remote check  

## 2011-01-12 ENCOUNTER — Encounter: Payer: Medicare Other | Admitting: *Deleted

## 2011-01-13 ENCOUNTER — Ambulatory Visit (INDEPENDENT_AMBULATORY_CARE_PROVIDER_SITE_OTHER): Payer: Medicare Other | Admitting: *Deleted

## 2011-01-13 DIAGNOSIS — I4891 Unspecified atrial fibrillation: Secondary | ICD-10-CM

## 2011-01-13 DIAGNOSIS — Z7901 Long term (current) use of anticoagulants: Secondary | ICD-10-CM

## 2011-01-14 ENCOUNTER — Encounter: Payer: Self-pay | Admitting: *Deleted

## 2011-01-20 NOTE — Consult Note (Signed)
NAMEFIONA, [name] NO.:  [id number]   MEDICAL RECORD NO.:  [id number]          PATIENT TYPE:  INP   LOCATION:  6739                         FACILITY:  [facility]   PHYSICIAN:  Colleen Can. Deborah Chalk, M.D.DATE OF BIRTH:  November 18, 1945   DATE OF CONSULTATION:  05/01/2008  DATE OF DISCHARGE:                                 CONSULTATION   Thank you very much for asking me to see [name] for evaluation of  heart failure and atrial fibrillation.  She had a recent admission and  emergency room visit for atrial fibrillation and bilateral lower  extremity edema.  She has not felt well for week, but has also had  diarrhea, chills, and poor intake.  She has had no actual fever.  She  saw Dr. Hyman Hopes today who was admitted for further evaluation.  She has  problems with diabetes, atrial fibrillation, peripheral vascular  disease, and had a history of a renal transplant in 1999.  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   Her past medical history includes allergies to CODEINE and ASPIRIN.   CURRENT MEDS:  Aspirin, Lantus insulin, Tylenol, vitamins, Januvia 100  mg a day, sertraline 25 mg a day, Actos 30 mg a day, Valtrex, CellCept,  and prednisone.   FAMILY HISTORY:  Father died in his 79s of cancer.  Mother died in her  45s of cancer.   SOCIAL HISTORY:  Lives alone.  She has 2 daughters.  No tobacco.  No  alcohol.   REVIEW OF SYSTEMS:  Recently seen last week in the ER because of edema,  noted to be in atrial fibrillation.  She was started on Cartia at that  time.  She has had fatigue.  She has had chills and diarrhea.  She has  also had shortness of breath, PND, and orthopnea.  She has had cramps.  No vomiting.  She has also had foot pain.   On exam, in general, she is frail small woman, is 88.  She was sitting  upright in the room.  Blood pressure was  110/70.  Heart rate was 110 and  it was irregularly irregular.  Her skin was warm and dry.  Color was  normal.  There was definite increase in jugular venous distention.  No  prominent V waves were noted.  Lungs were reasonably clear.  Heart  showed an irregularly irregular rhythm.  There was a systolic apical  murmur.  S1 and S2 appeared to be normal in intensity.  Abdomen was  soft.  No masses.  There was positive hepatojugular reflux.  Lower  extremities showed 1+ edema.  Neurologically, she was intact.   Current lab is pending.   OVERALL IMPRESSION:  1. Atrial fibrillation, new.  2. Congestive heart failure with elevated jugular venous distention      out of proportion to the pulmonary findings.  3. Diabetes.  4. History of renal transplant.  5. Questionable diarrhea and flu-like illness.   PLAN:  We will check a  2-D echocardiogram.  We will check BNP levels.  We will try for rate control, diuresis, and anticoagulation.  She will  have contact isolation for now, but I think we need to continue to  evaluate her because of her symptoms that are out of proportion to what  one would expect given her history.  She does not have a history of  rheumatic fever and had no significant valvular disease in 2004 at the  time of her atrial fibrillation.      Colleen Can. Deborah Chalk, M.D.  Electronically Signed     SNT/MEDQ  D:  05/04/2008  T:  05/04/2008  Job:  [id number]   cc:   Garnetta Buddy, M.D.

## 2011-01-20 NOTE — Assessment & Plan Note (Signed)
OFFICE VISIT   CLYTEE, HEINRICH  DOB:  1945/09/24                                       07/10/2008  ZOXWR#:[id number]   The patient returns today to discuss findings of her right femoral  angiogram done last week by Dr. Darrick Penna.  She has been having drawing  sensation in her right foot as well as chronic numbness in the right  foot.  She does not really describe pain in the foot but does have  severe claudication symptoms.  She has no history of nonhealing ulcers  or infection.  She does have a functioning renal transplant.   Her right foot on physical exam continues to be very slightly cooler  than the left with slightly diminished sensation compared to the left.  She has palpable femoral pulses bilaterally at 3+ with difficult  popliteal and distal pulses in both sides.  Angiogram reveals the right  common profunda and superficial femoral arteries to be patent although  there is one focal lesion proximally in the right superficial femoral  artery.  Popliteal artery is patent across the knee joint with diseased  tibial vessels as one would expect, anterior tibial artery appears to be  patent to the ankle without complete obstruction.   I am still uncertain that her symptoms are due to her vascular disease.  The only thing to offer would be PTA and stenting of her right  superficial femoral artery but I do not think this will eliminate  chronic numbness in the right foot which seems to be more of a  neuropathy type problem.  I discussed this with her if her symptoms  worsen or if she develops any infection or ulceration the patient should  be in touch with [place] for [place] to consider that procedure.  Otherwise she  will return to see [place] on a p.r.n. basis.  She is back on her Coumadin  for her valve disease which is followed by Dr. Colleen Can. Tennant.   Quita Skye Hart [place], M.D.  Electronically Signed   JDL/MEDQ  D:  07/10/2008  T:  07/11/2008  Job:  [id number]   cc:    Colleen Can. Deborah Chalk, M.D.

## 2011-01-20 NOTE — Consult Note (Signed)
NAMETABITHIA, STRODER NO.:  [id number]   MEDICAL RECORD NO.:  [id number]          PATIENT TYPE:  EMS   LOCATION:  MAJO                         FACILITY:  [facility]   PHYSICIAN:  Maree Krabbe, M.D.DATE OF BIRTH:  September 30, 1945   DATE OF CONSULTATION:  DATE OF DISCHARGE:  04/27/2008                                 CONSULTATION   CHIEF COMPLAINT:  Swelling.   HISTORY OF PRESENT ILLNESS:  The patient is a 65 year old African  American woman, status post kidney transplant done in 1999 at [facility].  The patient has been on immunosuppressants  and has been off hemodialysis.  The patient came to ED, apparently  referred by Dr. Hyman Hopes, with one day history of bilateral lower extremity  swelling.  The patient denies having noticed swelling anywhere else, for  example on her abdomen or periorbital region.  She denies any weight  gain, chest pain, shortness of breath, cough, fever, hemoptysis,  immobility, travel, or any other systemic complaints.  The patient does  not have any history of DVT in the past.   ALLERGIES:  The patient is allergic to CODEINE and ASPIRIN.   PAST MEDICAL HISTORY:  Notable for diabetes, atrial fibrillation,  peripheral vascular disease.  As noted above, the patient is status post  kidney transplantation.   CURRENT MEDICATIONS:  1. Penicillin 5 mg once daily.  2. CellCept 250 mg two tabs twice a day.  3. Valtrex 500 mg, Monday, Wednesday, and Friday.  4. Vitamin 1.25 mg monthly.  5. Actos 30 mg once daily.  6. Sertraline 25 mg once daily.  7. Januvia 100 mg once daily.  8. Vitamin once a day.  9. Tylenol as required.  10.Lantus 14 units once daily.  11.Aspirin 325 mg once daily.   SOCIAL HISTORY:  The patient lives alone.  She is a nonsmoker,  nondrinker, and there is no history of drug abuse.   FAMILY HISTORY:  Noncontributory.   REVIEW OF SYSTEMS:  As per history of present illness.   ADMISSION PHYSICAL  EXAMINATION:  VITAL SIGNS:  Temperature 97, blood  pressure 108/77, oxygen saturation 100% on room air, pulse 112,  respiratory rate 18.  GENERAL:  NAD.  HEAD, EYES, ENT:  Normocephalic and atraumatic.  Pupils are equal,  round, and reactive to light, EOMI. Mucous membranes moist, oropharynx  is clear.  NECK:  Supple.  No cervical lymphadenopathy.  CHEST:  Clear to auscultation bilaterally.  CARDIOVASCULAR:  Regular rate and rhythm.  Normal heart sounds.  No  murmurs, rubs, or gallops.  ABDOMEN:  Soft, nontender, and nondistended.  EXTREMITIES:  Pitting edema 2+ bilateral lower extremities, left more  severe than right.  SKIN:  Normal turgor.  No rashes.  No joint or spine tenderness.  NEURO: Alert and oriented x3. Cranial nerves II through XII intact.  No  motor or sensory deficits.  PSYCHIATRIC: Appropriate.   LABORATORY DATA:  1. Chest x-rays suggestive of mild heart failure.  2. Echocardiogram, atrial flutter with variable AV conduction.  3. Hemoglobin 12.3, hematocrit 38.1, WBC 4, platelet 198, MCV 101.8.  ANC 3.2, sodium 140, potassium 4.1, chloride 112, bicarbonate 19,      BUN 20, creatinine 0.97, and blood glucose of 160.  PT 15.5 and INR      1.2.   ASSESSMENT AND PLAN:  This is a 65 year old African American woman  status post kidney transplant admitted with atrial flutter, and  bilateral lower extremity swelling.   Problem #1.  Atrial flutter with variable heart rate.  Presently,  asymptomatic.  The patient is not complaining of any shortness of breath  and vital signs are stable.  The patient can be started on rate control  medications diltiazem as required.  The atrial fib/flutter has been a  chronic problem for the patient.  Recommend getting thyroid function  test done, if it has not been checked already.  Pt does not require  admission for this problem in our opinion.  Problem #2.  Bipedal edema.  This may be fluid retention related to  patient's renal  transplant, or possibly a sign of early onset congestive  heart failure.  There is no evidence of pulmonary edema.  The patient's  kidney function appears to be normal at this time.  The edema has  improved in the ED with leg elevation alone, according to the patient.  She does not requre admission for this problem.  She will follow-up with  her nephrologist for further evaluation and treatment of this problem  this week if needed.  Problem #3.  Status post kidney transplant.  As mentioned above, the  patient's kidney function appears to be normal at this time.  We  recommend continuing with the patient's immunosuppressant medications  and followup as an outpatient.  Disposition:  No need for hospital admission from renal standpoint.  We  have discussed with ED physician.  She can follow up with her [facility]  physician for edema as needed.      Zara Council, MD  Electronically Signed      Maree Krabbe, M.D.  Electronically Signed    AS/MEDQ  D:  04/27/2008  T:  04/28/2008  Job:  [id number]

## 2011-01-20 NOTE — Op Note (Signed)
[name] [name], [name] [name]              ACCOUNT NO.:  [id number]   MEDICAL RECORD NO.:  [id number]          PATIENT TYPE:  AMB   LOCATION:  SDS                          FACILITY:  [facility]   PHYSICIAN:  Janetta Hora. Fields, MD  DATE OF BIRTH:  [birth year]   DATE OF PROCEDURE:  07/06/2008  DATE OF DISCHARGE:  07/06/2008                               OPERATIVE REPORT   PROCEDURE:  Right lower extremity arteriogram.   PREOPERATIVE DIAGNOSIS:  Claudication with early rest pain, right foot.   POSTOPERATIVE DIAGNOSIS:  Claudication with early rest pain, right foot.   ANESTHESIA:  Local with IV sedation.   OPERATIVE DETAILS:  After obtaining informed consent, the patient was  brought to the [facility].  The patient was placed in supine position on the  angio table.  Local anesthesia was infiltrated over the right common  femoral artery after sterile prep and drape.  Next, a Majestic needle  was used to cannulate the right common femoral artery.  This was  moderately calcified.  A 0.035 Wholey wire was then advanced up into the  abdominal aorta under fluoroscopic guidance.  Next, a 5-French sheath  was placed over the guidewire in the right common femoral artery.  A  right lower extremity arteriogram was then obtained.  This shows a  patent distal right external iliac artery.  The anastomosis to her right  transplanted kidney was patent at its proximal aspect.  There was good  filling of the distal branches.  Right common femoral artery was patent.  Right profunda femoral artery was patent, but there was diffuse disease  throughout the distal branches.  The right superficial femoral artery  was patent at its origin.  There was a subtotal occlusion of the  superficial femoral artery approximately 10 cm from its origin.  The  superficial femoral artery was diffusely diseased below this.  There was  another subtotal occlusion of the superficial femoral artery at the  adductor hiatus.  Popliteal artery was  patent, but diffusely diseased.  There was an 80% stenosis of the popliteal artery just behind the knee.  The below-knee popliteal artery had mild atherosclerotic changes but it  was patent without flow-limiting stenosis.  The anterior tibial artery  was patent.  Tibioperoneal trunk was patent, but the posterior tibial  artery and peroneal arteries occluded in the distal leg.  The anterior  tibial artery was the only runoff vessel to the foot.  There was a high-  grade stenosis of the anterior tibial artery just above the ankle,  approximately 80%.  The dorsalis pedis artery did fill and there was  flow from this into the foot.  A complete plantar arch was not  visualized.   At this point, the patient was taken to the holding area with a 5-French  sheath left in place to be pulled in the holding area.  The patient  tolerated the procedure well and there were no complications.   OPERATIVE FINDINGS:  1. Patent external iliac artery and proximal anastomosis of the right      transplanted kidney.  2. Diffusely diseased right  superficial femoral artery with high-grade      subtotal occlusion in its proximal third.  3. Heavily calcified vessels diffusely.  4. Single-vessel runoff via the anterior tibial artery with high-grade      stenosis of the distal anterior tibial artery, just above the ankle      with a patent dorsalis pedis artery.      Janetta Hora. Fields, MD  Electronically Signed     CEF/MEDQ  D:  07/06/2008  T:  07/07/2008  Job:  [id number]

## 2011-01-20 NOTE — H&P (Signed)
[name], [name] NO.:  [id number]   MEDICAL RECORD NO.:  [id number]          PATIENT TYPE:  INP   LOCATION:  6739                         FACILITY:  [facility]   PHYSICIAN:  Garnetta Buddy, M.D.   DATE OF BIRTH:  08-01-1946   DATE OF ADMISSION:  05/01/2008  DATE OF DISCHARGE:                              HISTORY & PHYSICAL   REASON FOR ADMISSION:  Congestive heart failure, atrial fibrillation  with rapid ventricular rate.   HISTORY OF PRESENT ILLNESS:  This is a 65 year old African-American  female, status post renal transplantation in 1999 at [facility].  She presents with 1- to 2-week history of lower extremity  swelling, shortness of breath on exertion, orthopnea x2, and  palpitations.  She states she __________ since colonoscopy on March 15, 2008, in which she was found to have diverticulosis and seen by Dr.  Evette Cristal.  She has had frequent loose bowel movements since then, poor p.o.  intake, and weight loss.  She was evaluated on April 27, 2008, in the  emergency room and thought to be congestive heart failure, but stable  and discharged from the emergency room.   PAST MEDICAL HISTORY:  1. Atrial fibrillation, rapid ventricular rate.  2. History of renal transplantation at [facility] in 1999.  3. History of parathyroidectomy.  4. History of herpes simplex.  5. History of anemia.  6. Secondary hyperparathyroidism.  7. History of thyroid nodules, positive __________  8. History diabetes mellitus type 2.  9. History of osteoporosis.  10.History of PVD.  11.History of depression.   MEDICATIONS:  1. Neoral 50 mg daily.  2. CellCept 500 mg daily.  3. Prednisone 5 mg daily.  4. Cartia XT 120 mg daily.  5. Sertraline 25 mg daily.  6. Lantus 12 units q.a.m.  7. Valtrex 500 mg Monday, Wednesday, and Friday.  8. Caltrate 600 mg b.i.d.  9. Actos 30 mg daily.  10.Januvia 100 mg daily.  11.Aspirin 325 mg daily.   ALLERGIES:   CODEINE.   REVIEW OF SYSTEMS:  Positive fatigue and weakness.  Negative for fevers,  sweats, or chills, although she did complain of some hot and cold  sensations.  EYES:  No visual loss, blurred vision, or double vision.  No history of cataracts.  No history of glaucoma.  EARS, NOSE, MOUTH AND  THROAT:  No hearing loss, no epistaxis, no __________ no sinusitis.  CARDIOVASCULAR:  Denies angina, but positive for orthopnea, positive for  paroxysmal nocturnal dyspnea, positive __________ followed up, did not  want to start Coumadin.  RESPIRATORY SYSTEM:  Positive for cough, sputum  production.  Negative for pleuritic chest pain __________ poor appetite.  Positive for diarrhea.  Negative for vomiting.  UROGENITAL:  Negative  for dysuria, hematuria, positive for renal transplant.  NEUROLOGIC:  No  strokes, seizures, numbness, tingling, positive weakness.  No history of  neuropathy.  ENDOCRINE:  Positive for diabetes, questionable  hypothyroidism, although is not on replacement therapy.  DERMATOLOGIC:  Denies any skin rash, no itching, HEMATOLOGIC/ONCOLOGIC:  Negative for  cancer __________ or pulmonary embolus.  FAMILY HISTORY:  Negative for end-stage renal disease.   SOCIAL HISTORY:  Unmarried, 2 children.  No tobacco, no alcohol.   PHYSICAL EXAMINATION:  GENERAL:  Alert, very tearful thin lady in no  obvious distress at rest sitting upright in chair.  VITAL SIGNS:  Blood pressure 110/70, pulse 110 irregularly irregular,  weight of 105 pounds.  HEAD AND EYES:  No icterus, no pallor.  Pupils round, equal, and  reactive.  Funduscopic evaluation benign.  Bilateral periorbital edema.  EARS, NOSE, MOUTH, AND THROAT:  Tympanic membranes were normal.  Nasal  mucosa clear.  Oropharynx is clear.  Moist mucous membranes.  NECK:  Supple.  JVP elevated at 8 cm and was supple.  No thyromegaly.  CARDIOVASCULAR:  Regular rate and rhythm.  2/6 systolic murmur, left  sternal edge, did not radiate.  No  heaves, no thrills.  RESPIRATORY:  Lung fields were clear, midzone rales, there are diminished breath  sounds at bases, percussion note dull at bases.  ABDOMEN: Liver edge was  3-4 cm distended, was smooth, non-nodular, bilateral ascites with fluid  shift noted.  EXTREMITIES:  3+ lower extremity edema.   Most recent labs pending.  Discussed with Dr. Arrie Aran who is going to  follow up on the results as well as the __________ chest x-ray and EKG  pending.   ASSESSMENT AND PLAN:  1. Congestive heart failure.  We will check TSH, consult Cardiology,      troponins x8, hold Actos for diabetes, 80 mg IV Lasix.  2. Atrial fibrillation, rapid ventricular rate.  For rate control with      Cartia XT.  Coumadin has been declined by the patient in the past.      We will discuss with Cardiology.  3. End-stage renal disease, status post transplant.  Creatinine 0.9      from 1.2 baseline.  4. Diarrhea.  Stool most likely secondary to bowel edema, congestive      heart failure.  We will hold CellCept and treat symptomatically.  5. Diabetes mellitus.  Hold Actos, sliding scale insulin, and hold      Lantus.      Garnetta Buddy, M.D.  Electronically Signed     MWW/MEDQ  D:  05/01/2008  T:  05/02/2008  Job:  [id number]

## 2011-01-20 NOTE — Assessment & Plan Note (Signed)
OFFICE VISIT   LEATHA, ROHNER  DOB:  [birth year]                                       07/03/2008  [id number]#:[id number]   The patient returns today at the request of Dr. Deborah Chalk because the  right foot is getting worse.  I saw her on October 20, but she would not  stay to have a vascular study performed.  The vascular study was  performed on the 22nd which revealed dampened flow in the right foot  with opacified vessels and superficial femoral and tibial disease  bilaterally.  Today, the patient states that her foot is about the same,  although it does bother her at night.  She has severe claudication  symptoms limiting her walking to 50 yards.  She does not have rest pain  all the time in the foot, but it does seem cold.  She has no history of  infection or nonhealing ulcers.  She does have a functioning renal  transplant.  She is on chronic Coumadin therapy for her valve disease  followed by Dr. Deborah Chalk.   EXAM:  Today blood pressure 120/78, heart rate is 60 and irregular,  respirations 14.  Her femoral pulses are 3+ bilaterally.  There are no  popliteal or distal pulses.  The right foot is cooler than the left.  It  does have motion sensation and no calf tenderness.   I discussed the situation with the Jacki Cones in Dr. Ronnald Nian office and we  will stop her Coumadin today and supplement it with Lovenox.  We will  obtain a right lower extremity angiogram only by Dr. Darrick Penna on Friday  with limited contrast, because of her history of renal transplant.  I  discussed this with Dr. Hyman Hopes today, who is in agreement.  Said to  pretreat her with Mucomyst, but not to give her bicarb drip because she  does not handle volume loads well.  She will return to see me in 1 week  to discuss results of the angiogram.   Quita Skye. Hart Rochester, M.D.  Electronically Signed   JDL/MEDQ  D:  07/03/2008  T:  07/04/2008  Job:  [id number]

## 2011-01-20 NOTE — H&P (Signed)
[name] [name], [name] [name] NO.:  [id number]   MEDICAL RECORD NO.:  [id number]          PATIENT TYPE:  INP   LOCATION:  6739                         FACILITY:  [facility]   PHYSICIAN:  [name] [name], M.D.   DATE OF BIRTH:  Nov 05, 1945   DATE OF ADMISSION:  05/01/2008  DATE OF DISCHARGE:                              HISTORY & PHYSICAL   CHIEF COMPLAINT:  Lower extremity swelling and orthopnea.   HISTORY OF PRESENT ILLNESS:  The patient is a 65 year old African  American female who had a kidney transplant who presents with 1-2 weeks  of lower extremity swelling and shortness of breath on exertion, two-  pillow orthopnea, and palpitations.  She states she has felt unwell  since her colonoscopy in July 2009, at which time they found  diverticulosis.  She states she has had diarrhea for the past 8 days,  poor p.o. intake.  Additionally, she has had shortness of breath which  worsened over the past few days.  She was seen in the emergency room on  April 27, 2008.  Dr. Arlean Hopping consulted and thought she had a mild  stable CHF and discharged her home from the emergency room.  She  presents back to clinic today with worsening symptoms.   PAST MEDICAL HISTORY:  Significant for atrial fibrillation with RVR, she  has refused Coumadin in the past.  She is status post renal transplant  in 1999, status post parathyroidectomy, history of herpes, anemia,  secondary hyperparathyroidism, type 2 diabetes, osteoporosis, peripheral  vascular disease, and depression.   CURRENT MEDICATIONS:  1. Neoral 50 mg daily.  2. CellCept 500 mg b.i.d.  3. Prednisone 5 mg daily.  4. Cartia XT 120 mg daily.  5. Sertraline 25 mg daily.  6. Lantus 12 units daily.  7. Valtrex 500 mg on Monday, Wednesday, Friday.  8. Caltrate 600 mg b.i.d.  9. Actos 30 mg daily.  10.Januvia 100 mg daily.  11.Aspirin 325 mg daily.   ALLERGIES:  CODEINE.   REVIEW OF SYSTEMS:  Positive for fatigue, weakness, chills.   Negative  for visual symptoms, negative for hearing or ear symptoms.  Positive for  shortness of breath and orthopnea.  Positive for PND.  Positive for  peripheral vascular disease.  Negative for tobacco.  Positive for  diarrhea.  Negative for vomiting.  Negative for dysuria.  Negative for  any neurological symptoms.  Negative for itching.   FAMILY HISTORY:  There is no end-stage renal disease in her family.   SOCIAL HISTORY:  Negative for tobacco, negative for alcohol.  The  patient is unmarried.  She has two children.   PHYSICAL EXAMINATION:  VITAL SIGNS:  Temperature 98.4, pulse 107,  respirations 16, blood pressure 110/80, satting 99% on room air.  Her  weight is 48 kg.  GENERAL:  On exam, she is alert.  She is very tearful.  She is thin.  HEENT:  She does have some mild periorbital edema.  No icterus.  She has  moist mucous membranes.  NECK:  She does have significant JVD.  Her neck is supple.  No  lymphadenopathy.  CARDIOVASCULAR:  She has an irregularly irregular rhythm, tachycardiac.  I cannot appreciate a murmur.  LUNGS:  She has rales midway up her lung fields bilaterally.  GI:  Her liver is palpable below the costal margin 3-4 cm.  She does  have a little bit of ascites.  EXTREMITIES:  She has 2+ lower extremity edema in her ankles.   LABORATORY DATA:  Labs were ordered on admission and are not back yet.   ASSESSMENT AND PLAN:  The patient is a 65 year old female with a kidney  transplant who presents with symptoms of mild CHF and atrial  fibrillation with RVR.   1. Congestive heart failure.  We will check a TSH.  We will consult      Cardiology.  We will cycle cardiac enzymes x3, check an EKG.  We      will plan to gently diurese her with IV Lasix. We will check a      chest x-ray.  Do strict I's and O's and daily weight.  Continue her      aspirin.  2. Atrial fibrillation with RVR.  Again, we will consult Cardiology.      We will rate control with Cartia XT, check  an EKG.  We will discuss      anticoagulation with the patient, although she has refused this in      the past.  3. End-stage renal disease status post transplant.  She has had stable      creatinine with a baseline of 0.9-1.2.  We will monitor her      creatinine particularly as we diurese her, and we will continue      most of her immunosuppressants.  We will continue prednisone and      Neoral for now, we will hold CellCept for the time being.  4. Diarrhea.  We will check C. diff. and stool studies, but we will      treat her symptomatically with Imodium.  5. Type 2 diabetes.  We will hold her Actos and Lantus, but will      continue her Januvia and put her on sliding scale insulin while she      is in the hospital.  6. FEN/GI.  We will keep her on diabetic diet.  7. History of herpes.  We will continue her Valtrex.      [name] Muir, MD  Electronically Signed      [name] [name], M.D.  Electronically Signed    SO/MEDQ  D:  05/01/2008  T:  05/02/2008  Job:  [id number]

## 2011-01-20 NOTE — Consult Note (Signed)
VASCULAR SURGERY CONSULTATION   [name] [name], [name] [name]  DOB:  12/06/1945                                       06/26/2008  [id number]#:[id number]   The patient is a previous end-stage renal patient who has a functioning  renal transplant for the last several years (10 years).  She has been  having bilateral leg symptoms which will consist of aching, more so on  the right than the left.  She states her symptoms are worse and most  severe when she is in bed and improved with walking.  She had no history  of nonhealing ulcers, infection or true rest pain.  She is able to walk  about one block or so.  She had a venous study performed in May of this  year which was unremarkable with no evidence of deep venous obstruction.  She has recently had some congestive heart failure evaluated and is  being evaluated for possible valve replacement by Dr. Delfin Edis and I  am uncertain of what that recommendation is at the present time.   PHYSICAL EXAMINATION:  Vital signs:  Blood pressure 120/75, heart rate  75, respirations 14.  Abdomen:  Soft, nontender with no masses.  She has  3+ femoral pulses bilaterally.  No popliteal or distal pulses.  Both  feet are slightly cool.  No active infection, ulceration or acute  ischemia.  Sensation and motion intact.   We were going to perform a lower extremity arterial study in the  vascular lab today but the patient could not wait so she will be  rescheduled to have that performed.  She will return and we can discuss  the results.   Quita Skye Hart Rochester, M.D.  Electronically Signed  JDL/MEDQ  D:  06/26/2008  T:  06/27/2008  Job:  [id number]

## 2011-01-23 NOTE — H&P (Signed)
NAME:  [name] [name], [name] [name]                        ACCOUNT NO.:  [id number]   MEDICAL RECORD NO.:  [id number]                   PATIENT TYPE:  EMS   LOCATION:  MAJO                                 FACILITY:  [facility]   PHYSICIAN:  Colleen Can. Deborah Chalk, M.D.            DATE OF BIRTH:  [birth year]   DATE OF ADMISSION:  12/26/2002  DATE OF DISCHARGE:                                HISTORY & PHYSICAL   HISTORY OF PRESENT ILLNESS:  The patient is a very pleasant 65 year old  black female who was referred for evaluation of atrial fibrillation. It  began early this morning at approximately 6 a.m. when she awoke with her  heart racing and short of breath, no chest pain, but she was light headed  with no nausea or vomiting. She laid back down but her symptoms did not  really improve. She tried to walk the symptoms off , but because of  persistence of symptoms, she was brought to the emergency room for  evaluation. She has been placed on IV heparin and IV Cardizem with excellent  rate control, but she remains in atrial fibrillation.   PAST MEDICAL HISTORY:  She had a previous kidney transplant in 1999 with a  donor sister. Her renal diagnosis was secondary to hypertension. She was on  dialysis for 15 years. She has had diabetes since her kidney transplant.  Past tobacco abuse none since her transplant. She has had a history of  hernia repair. She has a history of osteoporosis.   ALLERGIES:  CODEINE AND ASPIRIN CAUSING HIVES AND RASH.   CURRENT MEDICATIONS:  Doses not noted.  1. Actonel.  2. Cyclosporine.  3. Prednisone.  4. CellCept.  5. Glucotrol.  6. Lexapro.  7. Cardia XT.  8. Nephro-Vite.   SOCIAL HISTORY:  She is separated. She is disabled. She has 2 daughters. She  lives alone. No current smoking.   FAMILY HISTORY:  Her father died of cancer in  his 33s. Her mother died of  cancer in her 19s. Two siblings with kidney failure.   REVIEW OF SYSTEMS:  Previously doing well. Trying to get  back into work  environment. History of indigestion. History of arthritis.   PHYSICAL EXAMINATION:  GENERAL:  She is pleasant.  VITAL SIGNS:  Blood pressure 131/63, heart rate in the 70s, respiratory rate  20.  SKIN:  Warm and dry. Color is normal.  LUNGS:  Clear.  HEART:  She has an irregularly irregular rhythm.  EXTREMITIES:  Without edema.   LABORATORY DATA:  Hematocrit 34, platelets 5000. Glucose 200, potassium 4.7.  CK and troponin are all negative.   An EKG shows atrial fibrillation with a controlled ventricular response.   IMPRESSION:  1. New onset of atrial fibrillation with currently controlled ventricular     response.  2. End-stage renal disease, status post donor transplant.  3. History of hypertension.  4. Adult onset diabetes mellitus, new onset since  transplantation.   PLAN:  Continue IV Cardizem and IV heparin. Get a 2D echocardiogram. Check a  TSH, T4. Will check chemistries.                                                Colleen Can. Deborah Chalk, M.D.    SNT/MEDQ  D:  12/26/2002  T:  12/26/2002  Job:  [id number]   cc:   Garnetta Buddy, M.D.  [address]  [place]  [place] [address]  Fax: [phone]

## 2011-01-23 NOTE — Discharge Summary (Signed)
NAME:  [name] [name], [name] [name]                        ACCOUNT NO.:  [id number]   MEDICAL RECORD NO.:  [id number]                   PATIENT TYPE:  INP   LOCATION:  5031                                 FACILITY:  [facility]   PHYSICIAN:  Garnetta Buddy, M.D.                DATE OF BIRTH:  03-24-1946   DATE OF ADMISSION:  09/20/2003  DATE OF DISCHARGE:  09/22/2003                                 DISCHARGE SUMMARY   ADMISSION DIAGNOSES:  1. Hyperglycemia.  2. Status post living related donor renal transplant 1999.  3. Hypertension.  4. Recent sinusitis.  5. Elevated liver function tests.  6. Secondary hyperparathyroidism.  7. History of atrial fibrillation.  8. Non-insulin-dependent diabetes mellitus type 2.   BRIEF HISTORY:  A 65 year old black female with living related donor renal  transplant 1999, followed by Dr. Hyman Hopes at the [facility] office.  Seen  several days ago for a sinusitis and was admitted for uncontrolled diabetes  and hyperglycemia.  She presented with lower abdominal pain and low back  pain the day prior to admission. She had been taking Keflex for a sinusitis  which has been improving.  Upon evaluation, she was found to have 4+  glucosuria on dip-stick and a glucose returning at 547.   HOSPITAL COURSE:  The patient was admitted to the hospital, placed on her  usual medications.  Lantis was started at 10 units daily and diabetic  education was requested.  Glucotrol was increased to 10 mg twice a day.  Avandia was started at 8 mg in the morning.  With all this, her blood sugars  came under control to under 200.  All blood cultures and urine cultures were  negative.   She was instructed to monitor blood sugars at home, a.c. and h.s., to record  them along with the time of day and bring to the office for follow up.  She  will be seeing Dr. Hyman Hopes 7-10 after discharge.  Renal function remained  stable with a creatinine of 1.4 and BUN of 25.  She did have mild LFT  elevation  upon presentation.  SGOT was 39, SGPT 68, alkaline phosphatase  123, bilirubin normal. At time of discharge, her SGOT was 42, SGPT 51,  alkaline phosphatase 90, total bilirubin 0.4.   DISCHARGE MEDICATIONS:  1. Neural 50 mg twice a day (decreased).  2. Prednisone 5 mg q.a.m.  3. Cell Cept 1000 mg b.i.d. (increased).  4. Cardia XL 120 mg daily.  5. Glucotrol XL 10 mg b.i.d. (increased).  6. Avandia 8 mg in the a.m. (new).  7. Lantis insulin 10 units subcutaneous q.h.s. (new).  8. Actonel 35 mg once a week.  9. Lexapro 10 mg daily.  10.      Nephro-Vite vitamin 1 daily.      Zenovia Jordan, P.A.  Garnetta Buddy, M.D.    RRK/MEDQ  D:  11/03/2003  T:  11/03/2003  Job:  [id number]

## 2011-01-23 NOTE — Discharge Summary (Signed)
[name] [name], STUPKA NO.:  [id number]   MEDICAL RECORD NO.:  [id number]          PATIENT TYPE:  INP   LOCATION:  6739                         FACILITY:  [facility]   PHYSICIAN:  Garnetta Buddy, M.D.   DATE OF BIRTH:  1946/06/22   DATE OF ADMISSION:  05/01/2008  DATE OF DISCHARGE:  05/09/2008                               DISCHARGE SUMMARY   DISCHARGE DIAGNOSES:  1. Congestive heart failure, resolved.  2. Atrial fibrillation with rapid ventricular rate.  3. Atrial appendage thrombus.  4. Severe mitral valve stenosis and moderate aortic valve stenosis.  5. End-stage renal disease status post renal transplant.  6. Diarrhea, resolved.  7. Diabetes mellitus.  8. Depression.   PROCEDURE:  May 02, 2008, 2-D echo.   SUMMARY:  1. Overall left ventricular systolic function was normal.  Left      ventricular ejection fraction estimated to be 60%.  No left      ventricular regional wall motion abnormalities.  2. Aortic valve was markedly calcified, markedly reduced aortic valve      leaflet excursion.  Findings consistent with moderate-to-severe      aortic valve stenosis.  3. There was marked mitral annular calcification.  Findings consistent      with severe mitral stenosis, moderate mitral valvular      regurgitation.  4. Left atrium was moderately dilated.  5. Estimated peak pulmonary artery systolic pressure was mildly      increased.  6. Moderate-to-severe tricuspid valvular regurgitation.  7. The right atrium was mildly dilated.   PROCEDURE:  May 04, 2008, transesophageal echocardiogram.   SUMMARY:  1. Left ventricular systolic function normal.  Left ventricular      ejection fraction estimated range being 55%-60%.  No evidence of      left ventricular regional wall motion abnormalities.  Left      ventricular wall thickness mildly increased.  2. Aortic valve thickness mildly increased.  Aortic valve mildly      calcified.  Findings consistent with  moderate aortic valve stenosis      and trivial aortic valvular regurgitation.  3. Moderate extensive mild vessel atheroma at the aortic arch with no      ulceration and no associated thrombus.  4. Moderate to marked thickening of the mitral valve involving the      anterior and posterior leaflets and with marked restriction of      leaflet motion.  Moderate calcification of the mitral valve      involving the anterior and posterior leaflets.  It was marked      restriction of leaflet motion.  Moderate mitral annular      calcification, markedly reduced mitral valve leaflet excursion.      Findings consistent with moderate-to-severe mitral stenosis and      moderate mitral valvular regurgitation.  5. The left atrium was mildly dilated.  There was a small 12 mm      pedunculated and mobile thrombus in the body of the left atrial      appendage.  There was moderate continuous spontaneous echo contrast  smoke in the left atrial appendage.  6. Right ventricle was mildly dilated.  7. Moderate tricuspid valvular regurgitation.  8. Right atrium, mildly dilated.  9. Recommended full anticoagulation with Lovenox until INR is      therapeutic secondary to left atrial appendage thrombus.   PROCEDURE:  May 08, 2008, ultrasound of renal transplant.   IMPRESSION:  1. No evidence of right lower quadrant renal transplant obstruction.  2. Transplant cystic lesions.  The lower pole lesion is likely      minimally complex cyst.  Consider ultrasound followup in      approximately 6 months to confirm stability.   CONSULTATIONS:  Colleen Can. Deborah Chalk, MD   HISTORY OF PRESENT ILLNESS:  This is a 65 year old African American  female, who has undergone a kidney transplant in the past and presents  with the 1-2 weeks' history of lower extremity swelling, dyspnea on  exertion, two-pillow orthopnea, palpitations.  [name] [name] states [name] [name] has felt  unwell since her colonoscopy in July 2009, at which time they  found  diverticulosis.  [name] [name] states [name] [name] has had diarrhea for the past 8 days  with poor p.o. intake.  Additionally, [name] [name] has had shortness of breath  which has also worsened over the past few days.  [name] [name] was seen in the  emergency room on April 27, 2008, diagnosed with mild stable CHF and  discharged home from the emergency room.  [name] [name] presents back today with  worsening symptoms.   ADMISSION LABORATORY DATA:  WBC 4.3, hemoglobin 12.0, hematocrit 36.4,  platelets 189.  Sodium 139, potassium 3.8, chloride 110, CO2 21, glucose  308, BUN 16, creatinine 1.07, total bilirubin 0.7, alkaline phos 49, AST  is 34, ALT 31, total protein 5.8, albumin 3.3, calcium 8.8, phosphorus  3.3, CK-MB 2.2, troponin 0.01.  TSH 1.093.   DIAGNOSTIC RADIOLOGICAL EXAMINATIONS:  May 01, 2008, two-view chest x-  ray.   IMPRESSION:  1. Mildly improved mild changes of congestive heart failure      superimposed on chronic obstructive pulmonary disease.  2. Stable cardiomegaly.   HOSPITAL COURSE:  1. Congestive heart failure, resolved upon admission.  Cardiology was      consulted.  Cardiac enzymes were cycled which were negative.  The      patient received IV Lasix initially with close monitoring of her      intake and output as well as daily weight and her renal function.      As her serum creatinine increased, her Lasix therapy was      discontinued and the patient continued to improve during her      hospital stay with resolution of her congestive heart failure by      discharge.  2. Atrial fibrillation with rapid ventricular rate.  Again upon      admission, Cardiology was consulted and the patient was placed on      telemetry for close monitoring.  [name] [name] was placed on Cardizem during      her hospital stay for rate control.  Digoxin therapy and      anticoagulation therapy were also initiated during her stay.  [name] [name]      seemed to tolerate the medications well without difficulty.  The      patient underwent a  2-D echo and TEE, both of which results are as      stated above.  The patient was discharged on Lovenox and Coumadin      therapy and we will have close follow up  of her PT/INR levels with      Cardiology at the outpatient Coumadin clinic.  At the time of      discharge, the patient's heart rate ranged from the 70s to 90s and      blood pressure was 119/84.  3. Atrial appendage thrombus which was found after the patient      underwent a transesophageal echocardiogram.  [name] [name] was already on      anticoagulation for the atrial fibrillation and [name] [name] continued      anticoagulation which will be followed up as an outpatient.  Please      refer to #2 for more information.  4. Severe mitral valvular stenosis and moderate aortic valve stenosis,      originally discovered on a 2-D echocardiogram.  TEE was performed      for better visualization of the valve.  The patient was to have a      cardiac cath during this hospitalization as well, however, it was      initially postponed secondary to increasing creatinine level.  [name] [name]      will be followed up by Cardiology as an outpatient and the cardiac      cath and eventual valve replacement surgery will be scheduled for a      future date.  5. End-stage renal disease status post renal transplant.  As stated      above, the patient was initially placed on diuretic therapy      secondary to her congestive heart failure.  It was shortly      discontinued after her admission secondary to increase in her serum      creatinine level.  The patient continue to diurese well during her      hospitalization and the serum creatinine had improved.  At the time      of discharge, her creatinine was 1.39 and BUN was 34.  [name] [name]      continued her immunosuppression therapy as well during her      hospitalization which [name] [name] seemed to tolerate well without      difficulty.  6. Diarrhea.  Originally upon admission, the patient did complain of      diarrhea.  Stool studies  were collected and were negative.  The      patient's diarrhea resolved during her hospitalization.  7. Diabetes mellitus.  The patient's Actos and Lantus therapy was      initially held at the time of admission and [name] [name] continued her      outpatient regimen of Januvia therapy as well as placed on a      sliding scale insulin and a carbohydrate-modified diet.  As the      patient's diarrhea resolved and the patient began eating better,      her blood glucose levels gradually increased and as a result her      Lantus therapy was resumed and [name] [name] was placed on a moderate sliding-      scale insulin.  Prior to discharge, the patient's blood glucose      levels were well controlled and ranged from 108-111.  8. Depression.  The patient continued her outpatient regimen of Zoloft      therapy during her entire hospitalization, which [name] [name] tolerated well      without difficulty.   DISCHARGE MEDICATIONS:  1. Coumadin 5 mg 1 tablet p.o. daily.  2. Lovenox 45 mg one shot subcu b.i.d.  3. Prednisone 5 mg p.o. daily.  4.  Zoloft 25 mg p.o. daily.  5. Valtrex 500 mg on Mondays, Wednesdays, and Fridays.  6. Caltrate 600 mg b.i.d.  7. Januvia 100 mg daily.  8. CellCept 500 mg p.o. b.i.d.  9. Neoral 25 mg p.o. b.i.d.  10.Digoxin 0.125 mg daily.  11.Cardizem CD 180 mg daily.  12.Lantus 12 units subcu daily.  13.Actos 30 mg p.o. daily.   DISCHARGE INSTRUCTIONS:  The patient is to resume a diabetic diet.  [name] [name]  has a followup appointment on May 29, 2008, at 8:45 with Dr. Hyman Hopes,  her primary nephrologist.  The patient also has an appointment with Dr.  Deborah Chalk, physician extender on May 11, 2008, at 2:00 p.m. at which  time [name] [name] will visit the Coumadin clinic and get established and have lab  work.  At that time, they will also draw a renal panel and fax results  over to [facility] for Dr. Marland Mcalpine review.  The patient need a followup renal  ultrasound in approximately 6 months to evaluate  transplant cystic  lesions and to confirm stability.   Please note it took approximately 45 minutes to prepare this discharge.      Tracey P. Sherrod, NP      Garnetta Buddy, M.D.  Electronically Signed    TPS/MEDQ  D:  06/01/2008  T:  06/02/2008  Job:  [id number]   cc:   [facility]  Colleen Can. Deborah Chalk, M.D.

## 2011-01-23 NOTE — Discharge Summary (Signed)
   NAME:  [name] [name], [name] [name]                        ACCOUNT NO.:  [id number]   MEDICAL RECORD NO.:  [id number]                   PATIENT TYPE:  INP   LOCATION:  2029                                 FACILITY:  [facility]   PHYSICIAN:  Colleen Can. Deborah Chalk, M.D.            DATE OF BIRTH:  [birth year]   DATE OF ADMISSION:  DATE OF DISCHARGE:  12/27/2002                                 DISCHARGE SUMMARY   PRIMARY DISCHARGE DIAGNOSIS:  1. Atrial fibrillation with subsequent conversion to normal sinus rhythm.   SECONDARY DISCHARGE DIAGNOSIS:  1. End stage renal disease with previous history of kidney transplant.  2. History of hypertension.  3. Diabetes.   HISTORY OF PRESENT ILLNESS:  The patient is a 65 year old black female who  is referred to the emergency room with an episode of atrial fibrillation.  The exact duration was unknown.  She had awakened with heart racing and  shortness of breath. She had no chest pain but did have some light  headedness. There was no nausea, no vomiting. She was able to lay back down,  yet her symptoms did not improve.  She presented to the emergency room where  she was placed on IV heparin and Cardizem for rate control. She was  subsequently seen and admitted for further evaluation.   Please see the dictated history and physical for further patient  presentation and profile.   LABORATORY DATA:  EKG showed atrial fibrillation.  CK and Troponin were  negative.  Hematocrit was 34.  Hemoglobin 11.  Chemistries were normal  except for a glucose of 200.   HOSPITAL COURSE:  The patient was admitted. She was placed on IV Cardizem  and IV heparin.  Thyroid studies were checked as well as chemistries.  By  the following morning at 6 a.m., she converted to normal sinus rhythm with a  rate of 60-70 and was felt to be a stable candidate for discharge that day.   DISCHARGE CONDITION:  Stable.    DISCHARGE MEDICATIONS:  She will continue all of her previous home  medicines. We will make arrangements to have a 2D echocardiogram as well as  an Adenosine Cardiolite and followup visit scheduled in the office for  further outpatient evaluation. She is to call if problems were to arise in  the interim.     Juanell Fairly C. Earl Gala, N.P.                 Colleen Can. Deborah Chalk, M.D.    LCO/MEDQ  D:  01/17/2003  T:  01/18/2003  Job:  [id number]   cc:   Garnetta Buddy, M.D.  [address]  [place]  [place] [address]  Fax: [phone]

## 2011-02-03 ENCOUNTER — Ambulatory Visit (INDEPENDENT_AMBULATORY_CARE_PROVIDER_SITE_OTHER): Payer: Medicare Other | Admitting: *Deleted

## 2011-02-03 DIAGNOSIS — Z7901 Long term (current) use of anticoagulants: Secondary | ICD-10-CM

## 2011-02-03 DIAGNOSIS — I4891 Unspecified atrial fibrillation: Secondary | ICD-10-CM

## 2011-02-16 ENCOUNTER — Other Ambulatory Visit: Payer: Self-pay | Admitting: Interventional Radiology

## 2011-02-16 DIAGNOSIS — I739 Peripheral vascular disease, unspecified: Secondary | ICD-10-CM

## 2011-02-19 ENCOUNTER — Other Ambulatory Visit: Payer: Self-pay | Admitting: Cardiology

## 2011-02-19 ENCOUNTER — Ambulatory Visit
Admission: RE | Admit: 2011-02-19 | Discharge: 2011-02-19 | Disposition: A | Discharge status: Home / Self Care | Payer: Medicare Other | Source: Ambulatory Visit | Attending: Interventional Radiology | Admitting: Interventional Radiology

## 2011-02-19 DIAGNOSIS — I739 Peripheral vascular disease, unspecified: Secondary | ICD-10-CM

## 2011-02-25 ENCOUNTER — Other Ambulatory Visit: Payer: Medicare Other

## 2011-03-03 ENCOUNTER — Encounter: Payer: Medicare Other | Admitting: *Deleted

## 2011-03-04 ENCOUNTER — Ambulatory Visit (INDEPENDENT_AMBULATORY_CARE_PROVIDER_SITE_OTHER): Payer: Medicare Other | Admitting: *Deleted

## 2011-03-04 DIAGNOSIS — I4891 Unspecified atrial fibrillation: Secondary | ICD-10-CM

## 2011-03-04 DIAGNOSIS — Z7901 Long term (current) use of anticoagulants: Secondary | ICD-10-CM

## 2011-03-05 LAB — POCT INR: INR: 2.5

## 2011-03-10 ENCOUNTER — Ambulatory Visit
Admission: RE | Admit: 2011-03-10 | Discharge: 2011-03-10 | Disposition: A | Discharge status: Home / Self Care | Payer: Medicare Other | Source: Ambulatory Visit | Attending: Interventional Radiology | Admitting: Interventional Radiology

## 2011-03-10 DIAGNOSIS — I739 Peripheral vascular disease, unspecified: Secondary | ICD-10-CM

## 2011-03-10 HISTORY — DX: Cerebral infarction, unspecified: I63.9

## 2011-03-10 HISTORY — DX: Essential (primary) hypertension: I10

## 2011-03-10 HISTORY — DX: Chronic kidney disease, unspecified: N18.9

## 2011-03-18 ENCOUNTER — Ambulatory Visit (INDEPENDENT_AMBULATORY_CARE_PROVIDER_SITE_OTHER): Payer: Medicare Other | Admitting: *Deleted

## 2011-03-18 DIAGNOSIS — I4891 Unspecified atrial fibrillation: Secondary | ICD-10-CM

## 2011-03-18 DIAGNOSIS — Z7901 Long term (current) use of anticoagulants: Secondary | ICD-10-CM

## 2011-03-18 LAB — POCT INR: INR: 1.8

## 2011-04-01 ENCOUNTER — Ambulatory Visit (INDEPENDENT_AMBULATORY_CARE_PROVIDER_SITE_OTHER): Payer: Medicare Other | Admitting: *Deleted

## 2011-04-01 DIAGNOSIS — Z7901 Long term (current) use of anticoagulants: Secondary | ICD-10-CM

## 2011-04-01 DIAGNOSIS — I4891 Unspecified atrial fibrillation: Secondary | ICD-10-CM

## 2011-04-01 LAB — POCT INR: INR: 2.3

## 2011-04-02 ENCOUNTER — Encounter: Payer: Medicare Other | Admitting: *Deleted

## 2011-04-07 ENCOUNTER — Encounter: Payer: Self-pay | Admitting: *Deleted

## 2011-04-16 ENCOUNTER — Ambulatory Visit (INDEPENDENT_AMBULATORY_CARE_PROVIDER_SITE_OTHER): Payer: Medicare Other | Admitting: *Deleted

## 2011-04-16 DIAGNOSIS — I4891 Unspecified atrial fibrillation: Secondary | ICD-10-CM

## 2011-04-16 DIAGNOSIS — Z7901 Long term (current) use of anticoagulants: Secondary | ICD-10-CM

## 2011-04-16 LAB — POCT INR: INR: 3.1

## 2011-04-24 ENCOUNTER — Telehealth: Payer: Self-pay | Admitting: *Deleted

## 2011-04-24 NOTE — Telephone Encounter (Signed)
Pt is taking antibodics and needed you to know.  Please advise if any change in her dosage.

## 2011-04-27 NOTE — Telephone Encounter (Signed)
Called spoke with pt, pt states she was started on Cephalexin 500mg  bid.  Advised pt that medication does not typically interact with Coumadin, so she can continue on same dosage of Coumadin and she does not need sooner recheck.  Keep scheduled appt.

## 2011-05-07 ENCOUNTER — Ambulatory Visit (INDEPENDENT_AMBULATORY_CARE_PROVIDER_SITE_OTHER): Payer: Medicare Other | Admitting: *Deleted

## 2011-05-07 DIAGNOSIS — I4891 Unspecified atrial fibrillation: Secondary | ICD-10-CM

## 2011-05-07 DIAGNOSIS — Z7901 Long term (current) use of anticoagulants: Secondary | ICD-10-CM

## 2011-05-07 LAB — POCT INR: INR: 2.8

## 2011-05-08 ENCOUNTER — Other Ambulatory Visit: Payer: Self-pay | Admitting: Interventional Radiology

## 2011-05-08 DIAGNOSIS — I739 Peripheral vascular disease, unspecified: Secondary | ICD-10-CM

## 2011-05-13 ENCOUNTER — Ambulatory Visit
Admission: RE | Admit: 2011-05-13 | Discharge: 2011-05-13 | Disposition: A | Discharge status: Home / Self Care | Payer: Medicare Other | Source: Ambulatory Visit | Attending: Interventional Radiology | Admitting: Interventional Radiology

## 2011-05-13 VITALS — BP 113/79 | HR 86 | Temp 97.3°F | Resp 16

## 2011-05-13 DIAGNOSIS — I739 Peripheral vascular disease, unspecified: Secondary | ICD-10-CM

## 2011-05-14 ENCOUNTER — Telehealth: Payer: Self-pay | Admitting: Radiology

## 2011-05-14 NOTE — Progress Notes (Signed)
Encounter addended by: Alden Server on: 05/14/2011  9:46 AM<BR>     Documentation filed: Visit Diagnoses

## 2011-05-14 NOTE — Progress Notes (Signed)
Pt continues to c/o pain---1st toe of Left foot which radiates to mid foot.  She describes the pain as constant & the toe is painful to touch.  The pain increases w/ activity.

## 2011-05-14 NOTE — Telephone Encounter (Signed)
Pt given referral appointment w/ Dr Hart Rochester on Tues, Sept 11, 2012 @ 3:30 pm, to arrive @ 3:15 pm.

## 2011-05-19 ENCOUNTER — Ambulatory Visit: Payer: Medicare Other | Admitting: Vascular Surgery

## 2011-06-01 ENCOUNTER — Other Ambulatory Visit: Payer: Self-pay | Admitting: Nephrology

## 2011-06-01 DIAGNOSIS — Z1231 Encounter for screening mammogram for malignant neoplasm of breast: Secondary | ICD-10-CM

## 2011-06-02 ENCOUNTER — Other Ambulatory Visit: Payer: Self-pay

## 2011-06-02 ENCOUNTER — Encounter: Payer: Self-pay | Admitting: Internal Medicine

## 2011-06-02 ENCOUNTER — Ambulatory Visit (INDEPENDENT_AMBULATORY_CARE_PROVIDER_SITE_OTHER): Payer: Medicare Other | Admitting: *Deleted

## 2011-06-02 DIAGNOSIS — I4891 Unspecified atrial fibrillation: Secondary | ICD-10-CM

## 2011-06-03 ENCOUNTER — Ambulatory Visit
Admission: RE | Admit: 2011-06-03 | Discharge: 2011-06-03 | Disposition: A | Discharge status: Home / Self Care | Payer: Medicare Other | Source: Ambulatory Visit | Attending: Nephrology | Admitting: Nephrology

## 2011-06-03 DIAGNOSIS — Z1231 Encounter for screening mammogram for malignant neoplasm of breast: Secondary | ICD-10-CM

## 2011-06-04 ENCOUNTER — Ambulatory Visit (INDEPENDENT_AMBULATORY_CARE_PROVIDER_SITE_OTHER): Payer: Medicare Other | Admitting: *Deleted

## 2011-06-04 DIAGNOSIS — I4891 Unspecified atrial fibrillation: Secondary | ICD-10-CM

## 2011-06-04 DIAGNOSIS — Z7901 Long term (current) use of anticoagulants: Secondary | ICD-10-CM

## 2011-06-04 NOTE — Progress Notes (Signed)
needs a return date recorded

## 2011-06-08 ENCOUNTER — Encounter: Payer: Self-pay | Admitting: Cardiology

## 2011-06-08 ENCOUNTER — Telehealth: Payer: Self-pay | Admitting: Cardiology

## 2011-06-08 LAB — REMOTE PACEMAKER DEVICE
BMOD-0005RV: 95 {beats}/min
BRDY-0002RV: 50 {beats}/min
BRDY-0004RV: 130 {beats}/min
RV LEAD IMPEDENCE PM: 583 Ohm

## 2011-06-08 NOTE — Telephone Encounter (Signed)
Left message to advise pt of need for consultation with Dr Johney Frame or Tereso Newcomer for surgical clearance prior to the procedure. Call back number left on machine.

## 2011-06-08 NOTE — Telephone Encounter (Signed)
This encounter was created in error - please disregard.

## 2011-06-08 NOTE — Telephone Encounter (Signed)
Message copied by Karle Plumber on Mon Jun 08, 2011  2:45 PM ------      Message from: Hillis Range      Created: Sun Jun 07, 2011  6:58 PM      Regarding: RE: surgical clearence      Contact: [phone]       She has severe mitral stenosis and would likely be high risk for surgeries.  I have not seen her since 11/11 (almost a year).  She would therefore need to be seen in consultation by me or Tereso Newcomer for surgical clearance prior to the procedure.                  ----- Message -----         From: Carollee Sires, RN         Sent: 06/04/2011   4:19 PM           To: Gardiner Rhyme, MD, Blaise Palladino Waldron Session, RN      Subject: surgical clearence                                       [name] [name] DOB 09/30/2045 is scheduled for vascular surgery with DR Earnie Larsson at [facility] 10/11.  Phone number [phone]. Fax [phone].  Is this patient cleared for surgery and to hold her coumadin. Thank you.

## 2011-06-09 LAB — PROTIME-INR: INR: 1.5

## 2011-06-09 LAB — POCT I-STAT, CHEM 8
BUN: 47 — ABNORMAL HIGH
Chloride: 106
Potassium: 4.6
Sodium: 141

## 2011-06-09 NOTE — Telephone Encounter (Signed)
Pt returned call. Informed of need for appt. She verbalizes understanding. Scheduled for appt by scheduler for Oct. 4th.

## 2011-06-10 ENCOUNTER — Encounter: Payer: Self-pay | Admitting: Physician Assistant

## 2011-06-10 LAB — GLUCOSE, CAPILLARY
Glucose-Capillary: 108 — ABNORMAL HIGH
Glucose-Capillary: 192 — ABNORMAL HIGH
Glucose-Capillary: 208 — ABNORMAL HIGH
Glucose-Capillary: 251 — ABNORMAL HIGH

## 2011-06-10 LAB — PROTIME-INR
INR: 1.1
Prothrombin Time: 13.5
Prothrombin Time: 14.2

## 2011-06-10 LAB — B-NATRIURETIC PEPTIDE (CONVERTED LAB): Pro B Natriuretic peptide (BNP): 133 — ABNORMAL HIGH

## 2011-06-10 LAB — URINALYSIS, ROUTINE W REFLEX MICROSCOPIC
Bilirubin Urine: NEGATIVE
Nitrite: NEGATIVE
Specific Gravity, Urine: 1.01
pH: 7

## 2011-06-10 LAB — RENAL FUNCTION PANEL
Albumin: 3.3 — ABNORMAL LOW
BUN: 30 — ABNORMAL HIGH
Calcium: 9.8
GFR calc Af Amer: 47 — ABNORMAL LOW
GFR calc non Af Amer: 39 — ABNORMAL LOW
Glucose, Bld: 140 — ABNORMAL HIGH
Glucose, Bld: 88
Phosphorus: 4.6
Phosphorus: 5.1 — ABNORMAL HIGH
Potassium: 4.6
Sodium: 134 — ABNORMAL LOW

## 2011-06-10 LAB — URINE MICROSCOPIC-ADD ON

## 2011-06-10 LAB — SODIUM, URINE, RANDOM: Sodium, Ur: 48

## 2011-06-10 LAB — CBC
Platelets: 224
RBC: 3.95
WBC: 4.5

## 2011-06-10 LAB — CYCLOSPORINE LEVEL: Cyclosporine: 55 — ABNORMAL LOW

## 2011-06-10 LAB — CREATININE, URINE, RANDOM: Creatinine, Urine: 36.4

## 2011-06-11 ENCOUNTER — Encounter: Payer: Self-pay | Admitting: Physician Assistant

## 2011-06-11 ENCOUNTER — Ambulatory Visit (INDEPENDENT_AMBULATORY_CARE_PROVIDER_SITE_OTHER): Payer: Medicare Other | Admitting: Physician Assistant

## 2011-06-11 ENCOUNTER — Telehealth: Payer: Self-pay | Admitting: Pharmacist

## 2011-06-11 DIAGNOSIS — I050 Rheumatic mitral stenosis: Secondary | ICD-10-CM

## 2011-06-11 DIAGNOSIS — I4891 Unspecified atrial fibrillation: Secondary | ICD-10-CM

## 2011-06-11 DIAGNOSIS — Z950 Presence of cardiac pacemaker: Secondary | ICD-10-CM

## 2011-06-11 DIAGNOSIS — Z01810 Encounter for preprocedural cardiovascular examination: Secondary | ICD-10-CM

## 2011-06-11 DIAGNOSIS — I519 Heart disease, unspecified: Secondary | ICD-10-CM

## 2011-06-11 DIAGNOSIS — I739 Peripheral vascular disease, unspecified: Secondary | ICD-10-CM

## 2011-06-11 DIAGNOSIS — N186 End stage renal disease: Secondary | ICD-10-CM

## 2011-06-11 MED ORDER — ENOXAPARIN SODIUM 40 MG/0.4ML ~~LOC~~ SOLN: 40.0000 mg | Freq: Two times a day (BID) | SUBCUTANEOUS | Status: DC

## 2011-06-11 NOTE — Assessment & Plan Note (Signed)
Status post transplant.  Followup with nephrology.

## 2011-06-11 NOTE — Patient Instructions (Signed)
The current medical regimen is effective;  continue present plan and medications.  Your physician has requested that you have an echocardiogram. Echocardiography is a painless test that uses sound waves to create images of your heart. It provides your doctor with information about the size and shape of your heart and how well your heart's chambers and valves are working. This procedure takes approximately one hour. There are no restrictions for this procedure.  Please see the coumadin clinic for bridging with Lovenox prior to procedure.  Follow up with Dr Johney Frame in 1 to 2 months.

## 2011-06-11 NOTE — Assessment & Plan Note (Addendum)
She is high risk for any procedure.  However, her upcoming arteriogram is a low risk procedure.  She should be able to proceed.  She will need close attention to fluid status.  She has not had an echocardiogram in 2 years.  We will make sure that she has a followup echocardiogram scheduled.  This does not need to be completed prior to her arteriogram.  She can followup with Dr. Johney Frame in one to 2 months.  I discussed the patient's case with him today.

## 2011-06-11 NOTE — Assessment & Plan Note (Signed)
Arteriogram planned at [facility] as noted.

## 2011-06-11 NOTE — Telephone Encounter (Signed)
Spoke with RN with Dr. Marland Mcalpine office.  Pt's SCr was 1.09 on 05/13/11.  RN states this has been stable lately.  Will continue with dosing below.

## 2011-06-11 NOTE — Assessment & Plan Note (Signed)
Volume stable. 

## 2011-06-11 NOTE — Progress Notes (Signed)
Pacer remote 

## 2011-06-11 NOTE — Assessment & Plan Note (Signed)
This is permanent.  Rate is fairly well controlled.  Heart rate is up a little bit today as her blood sugar had dropped prior to coming into the office.  She is high risk for recurrent stroke due to her previous history.  She will need Lovenox bridging for her upcoming procedure.  We will arrange this through our Coumadin clinic.

## 2011-06-11 NOTE — Telephone Encounter (Signed)
Pt saw Tereso Newcomer today.  Having surgery on 10/11.  Needs Lovenox bridge.  Reviewed Lovenox injection technique with patient.  Weight- 43kg, SCr 1.09 (from 2010), CrCl-36 mL/min.  Pt followed by Dr. Hyman Hopes with [facility].  Will check with his office to get more recent SCr.  If higher than 1.1, will likely need once daily dosing of Lovenox   10/4- Coumadin 1/2 tablet 10/5- Coumadin 1/2 tablet 10/6- No Coumadin or Lovenox 10/7- Lovenox 40mg  in AM and PM 10/8- Lovenox 40mg  in AM and PM 10/9- Lovenox 40mg  in AM and PM 10/10- Lovenox 40mg  in AM only.  MD at [facility] will let you know what to do after your surgery.   10/16- Recheck INR

## 2011-06-11 NOTE — Progress Notes (Signed)
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  She has a history of valvular heart disease and permanent atrial fibrillation.  Last echocardiogram 12/10: EF 60-65%, mild LVH, mild LAE, mild aortic stenosis with an aortic valve area of 1.47 cm and a mean gradient of 9, severe mitral annular calcification with severe mitral stenosis with a mean gradient of 7 mm of mercury, moderate TR, PASP 40.  She's had symptomatic bradycardia and status post pacemaker implantation.  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  She has chronic kidney disease and is status post renal transplantation.  She has diabetes.  She is status post parathyroidectomy for primary hyperparathyroidism.  She has a history of PAD status post prior right leg revascularization procedure with Dr.Yamagata.  She was last seen by Dr. Johney Frame 11/11.  She was doing well at that time and plans were for one-year followup.  She needs an arteriogram performed with Dr. Earnie Larsson at [facility] 06/18/11.  She apparently had a sore on her left toe recently.  We contacted the vascular clinic at [facility] and she is to have an arteriogram.  She will need to come off of Coumadin.  She denies chest pain, shortness of breath, syncope.  She denies orthopnea, PND or edema.  She has occasional palpitations.  Her biggest problem recently has been controlling her blood sugars.  Past Medical History  Diagnosis Date  . Diabetes mellitus   . Hypertension   . Chronic kidney disease     s/p renal transplant;  follows with [name]  . Stroke December 2011    left MCA distribution  . Permanent atrial fibrillation     coumadin clinic at [facility]  . Mitral stenosis     echo 12/10: EF 60-65%, mild LVH, mild LAE, mild AS with AVA 1.47 and mean 9, severe MAC, severe MS with mean 7, mod TR, PASP 40  .  Aortic stenosis   . Symptomatic bradycardia     s/p pacer  . Depression   . HSV (herpes simplex virus) infection   . Anemia   . PAD (peripheral artery disease)     s/p prior RLE angioplasty with [name]  . Hyperparathyroidism, primary     s/p parathyroidectomy    Current Outpatient Prescriptions  Medication Sig Dispense Refill  . b complex-vitamin c-folic acid (NEPHRO-VITE) 0.8 MG TABS Take 0.8 mg by mouth daily.        . BD PEN NEEDLE NANO U/F 32G X 4 MM MISC       . cycloSPORINE modified (NEORAL/GENGRAF) 25 MG capsule Take 25 mg by mouth 2 (two) times daily.        . digoxin (LANOXIN) 0.125 MG tablet TAKE 1 TABLET EVERY DAY  90 tablet  3  . ergocalciferol (VITAMIN D2) 50000 UNITS capsule Take 50,000 Units by mouth every 30 (thirty) days.        . furosemide (LASIX) 40 MG tablet TAKE 1 TABLET EVERY DAY IF WEIGHT IS INCREASED OR EDEMA  30 tablet  6  . insulin NPH-insulin regular (NOVOLIN 70/30) (70-30) 100 UNIT/ML injection Inject 15 Units into the skin daily with breakfast.        . metoprolol (LOPRESSOR) 50 MG tablet TAKE 1/2 TAB 3 TIMES A DAY  45 tablet  6  . mycophenolate (CELLCEPT) 250 MG capsule Take 250  mg by mouth 2 (two) times daily.        . predniSONE (DELTASONE) 5 MG tablet Take 5 mg by mouth daily.        . sitaGLIPtin (JANUVIA) 100 MG tablet Take 100 mg by mouth daily.        . valACYclovir (VALTREX) 500 MG tablet Take 500 mg by mouth daily. Monday, Wednesday and Friday       . warfarin (COUMADIN) 5 MG tablet Take 5 mg by mouth daily. Take as directed by Anticoagulation clinic          Allergies: Allergies  Allergen Reactions  . Codeine     Social history:  Nonsmoker  ROS:  Please see the history of present illness.  All other systems reviewed and negative.   Vital Signs: BP 118/70  Pulse 93  Ht 5' (1.524 m)  Wt 96 lb 12.8 oz (43.908 kg)  BMI 18.90 kg/m2  PHYSICAL EXAM: Well nourished, well developed, in no acute distress HEENT: normal Neck: no  JVD Cardiac:  normal S1, S2; Irregularly irregular rhythm; 2/6 systolic murmur heard best along the left sternal border Lungs:  clear to auscultation bilaterally, no wheezing, rhonchi or rales Abd: soft, nontender, no hepatomegaly Ext: no edema Skin: warm and dry Neuro:  CNs 2-12 intact, no focal abnormalities noted Psych: Normal affect  EKG:  Atrial fibrillation, heart rate 93, left axis deviation, poor R wave progression, Diffuse ST changes suggestive of digitalis effect, no significant change when compared to prior tracings  ASSESSMENT AND PLAN:

## 2011-06-11 NOTE — Assessment & Plan Note (Signed)
Followup with Dr. Johney Frame in one to 2 months.

## 2011-06-12 ENCOUNTER — Encounter: Payer: Self-pay | Admitting: Internal Medicine

## 2011-06-12 NOTE — Progress Notes (Signed)
Addended by: Judithe Modest D on: 06/12/2011 04:02 PM   Modules accepted: Orders

## 2011-06-15 ENCOUNTER — Telehealth: Payer: Self-pay | Admitting: Internal Medicine

## 2011-06-15 NOTE — Telephone Encounter (Signed)
Spoke with pt.  She has canceled her surgery at the moment due to illness. It has not been rescheduled at this time.  She did miss Saturday's dose of Coumadin but did not start Lovenox.  She will resume her previous dose of Coumadin today and keep her f/u INR check on 10/16.

## 2011-06-15 NOTE — Telephone Encounter (Signed)
Pt is sick and surgery at [facility] was canceled and so medication list and medication that was called in for patient she did not start or pick up because of the situation and she needs to know what she needs to do now.

## 2011-06-16 ENCOUNTER — Other Ambulatory Visit (HOSPITAL_COMMUNITY): Payer: Medicare Other

## 2011-06-22 ENCOUNTER — Other Ambulatory Visit (HOSPITAL_COMMUNITY): Payer: Medicare Other | Admitting: Radiology

## 2011-06-23 ENCOUNTER — Other Ambulatory Visit (HOSPITAL_COMMUNITY): Payer: Medicare Other

## 2011-06-23 ENCOUNTER — Encounter: Payer: Medicare Other | Admitting: *Deleted

## 2011-06-25 ENCOUNTER — Encounter: Payer: Self-pay | Admitting: *Deleted

## 2011-06-30 ENCOUNTER — Ambulatory Visit (INDEPENDENT_AMBULATORY_CARE_PROVIDER_SITE_OTHER): Payer: Medicare Other | Admitting: *Deleted

## 2011-06-30 ENCOUNTER — Ambulatory Visit (HOSPITAL_COMMUNITY): Payer: Medicare Other | Attending: Cardiology

## 2011-06-30 DIAGNOSIS — I080 Rheumatic disorders of both mitral and aortic valves: Secondary | ICD-10-CM | POA: Insufficient documentation

## 2011-06-30 DIAGNOSIS — I050 Rheumatic mitral stenosis: Secondary | ICD-10-CM

## 2011-06-30 DIAGNOSIS — I059 Rheumatic mitral valve disease, unspecified: Secondary | ICD-10-CM

## 2011-06-30 DIAGNOSIS — I4891 Unspecified atrial fibrillation: Secondary | ICD-10-CM

## 2011-06-30 DIAGNOSIS — Z7901 Long term (current) use of anticoagulants: Secondary | ICD-10-CM

## 2011-06-30 DIAGNOSIS — I359 Nonrheumatic aortic valve disorder, unspecified: Secondary | ICD-10-CM

## 2011-06-30 DIAGNOSIS — Z01810 Encounter for preprocedural cardiovascular examination: Secondary | ICD-10-CM

## 2011-06-30 DIAGNOSIS — I079 Rheumatic tricuspid valve disease, unspecified: Secondary | ICD-10-CM | POA: Insufficient documentation

## 2011-06-30 LAB — POCT INR: INR: 3.2

## 2011-07-02 ENCOUNTER — Telehealth: Payer: Self-pay | Admitting: Internal Medicine

## 2011-07-02 NOTE — Telephone Encounter (Signed)
Pt returning call to office regarding pt ECHO results. Please return pt call to discuss further.

## 2011-07-02 NOTE — Telephone Encounter (Signed)
Pt aware.

## 2011-07-04 NOTE — Assessment & Plan Note (Signed)
Will discuss with patients results with her upon return to see me 11/15.  She was previously felt to not be a surgical candidate.

## 2011-07-10 ENCOUNTER — Telehealth: Payer: Self-pay | Admitting: Internal Medicine

## 2011-07-10 NOTE — Telephone Encounter (Signed)
New message  Pt is calling back about test results Please call

## 2011-07-10 NOTE — Telephone Encounter (Signed)
11/2--pt calling stating someone keeps calling her about test results and she wants to know what they're calling about--advised--echo shows some regurgitation and dr Johney Frame will discuss with you at your appoint on 11/15--pt agrees--nt

## 2011-07-15 ENCOUNTER — Ambulatory Visit (INDEPENDENT_AMBULATORY_CARE_PROVIDER_SITE_OTHER): Payer: Medicare Other | Admitting: *Deleted

## 2011-07-15 DIAGNOSIS — I4891 Unspecified atrial fibrillation: Secondary | ICD-10-CM

## 2011-07-15 DIAGNOSIS — Z7901 Long term (current) use of anticoagulants: Secondary | ICD-10-CM

## 2011-07-23 ENCOUNTER — Encounter: Payer: Self-pay | Admitting: Internal Medicine

## 2011-07-23 ENCOUNTER — Ambulatory Visit (INDEPENDENT_AMBULATORY_CARE_PROVIDER_SITE_OTHER): Payer: Medicare Other | Admitting: Internal Medicine

## 2011-07-23 DIAGNOSIS — I050 Rheumatic mitral stenosis: Secondary | ICD-10-CM

## 2011-07-23 DIAGNOSIS — I495 Sick sinus syndrome: Secondary | ICD-10-CM

## 2011-07-23 DIAGNOSIS — I519 Heart disease, unspecified: Secondary | ICD-10-CM

## 2011-07-23 DIAGNOSIS — Z950 Presence of cardiac pacemaker: Secondary | ICD-10-CM

## 2011-07-23 DIAGNOSIS — I4891 Unspecified atrial fibrillation: Secondary | ICD-10-CM

## 2011-07-23 LAB — PACEMAKER DEVICE OBSERVATION
BMOD-0005RV: 95 {beats}/min
BRDY-0002RV: 50 {beats}/min
RV LEAD IMPEDENCE PM: 581 Ohm
VENTRICULAR PACING PM: 31

## 2011-07-23 MED ORDER — METOPROLOL TARTRATE 50 MG PO TABS: 50.0000 mg | ORAL_TABLET | Freq: Two times a day (BID) | ORAL | Status: DC

## 2011-07-23 NOTE — Assessment & Plan Note (Signed)
Normal pacemaker function See Arita Miss Art report No changes today  V rates are frequently elevated We will increase metoprolol to 50mg  BID Aggressive heart rate control with be essential in managing her MS.

## 2011-07-23 NOTE — Assessment & Plan Note (Signed)
Permanent afib Continue rate control as above Life long anticoagulation with coumadin.

## 2011-07-23 NOTE — Patient Instructions (Signed)
Your physician wants you to follow-up in: 6 months with [name] [name] 12 months with Dr Jacquiline Doe will receive a reminder letter in the mail two months in advance. If you don't receive a letter, please call our office to schedule the follow-up appointment.   Remote monitoring is used to monitor your Pacemaker of ICD from home. This monitoring reduces the number of office visits required to check your device to one time per year. It allows [place] to keep an eye on the functioning of your device to ensure it is working properly. You are scheduled for a device check from home on 10/22/2011. You may send your transmission at any time that day. If you have a wireless device, the transmission will be sent automatically. After your physician reviews your transmission, you will receive a postcard with your next transmission date.  You have been referred to Dr [name]   You have atrial fibrillation and mitral stenosis   Your physician has recommended you make the following change in your medication:  1) Increase your Metoprolol to 50mg  one twice daily

## 2011-07-23 NOTE — Assessment & Plan Note (Signed)
Stable No change required today    TTMs every 3 months Norma Fredrickson to see in 6 months I will see in 12 months

## 2011-07-23 NOTE — Progress Notes (Signed)
The patient presents today for routine electrophysiology followup.  Since last being seen in our clinic, the patient reports doing reasonably well.  She has severe MS but is minimally symptomatic.  She remains active. Today, she denies symptoms of palpitations, chest pain, shortness of breath, orthopnea, PND, lower extremity edema, dizziness, presyncope, syncope, or neurologic sequela.  The patient feels that she is tolerating medications without difficulties and is otherwise without complaint today.   Past Medical History  Diagnosis Date  . Diabetes mellitus   . Hypertension   . Chronic kidney disease     s/p renal transplant;  follows with [name]  . Stroke December 2011    left MCA distribution  . Permanent atrial fibrillation     coumadin clinic at [facility]  . Mitral stenosis     echo 12/10: EF 60-65%, mild LVH, mild LAE, mild AS with AVA 1.47 and mean 9, severe MAC, severe MS with mean 7, mod TR, PASP 40  . Aortic stenosis   . Symptomatic bradycardia     s/p pacer  . Depression   . HSV (herpes simplex virus) infection   . Anemia   . PAD (peripheral artery disease)     s/p prior RLE angioplasty with [name]  . Hyperparathyroidism, primary     s/p parathyroidectomy   Past Surgical History  Procedure Date  . Insert / replace / remove pacemaker   . Kidney transplant December 1999    Current Outpatient Prescriptions  Medication Sig Dispense Refill  . b complex-vitamin c-folic acid (NEPHRO-VITE) 0.8 MG TABS Take 0.8 mg by mouth daily.        . BD PEN NEEDLE NANO U/F 32G X 4 MM MISC       . cycloSPORINE modified (NEORAL/GENGRAF) 25 MG capsule Take 25 mg by mouth 2 (two) times daily.        . digoxin (LANOXIN) 0.125 MG tablet TAKE 1 TABLET EVERY DAY  90 tablet  3  . ergocalciferol (VITAMIN D2) 50000 UNITS capsule Take 50,000 Units by mouth every 30 (thirty) days.        . furosemide (LASIX) 40 MG tablet TAKE 1 TABLET EVERY DAY IF WEIGHT IS INCREASED OR EDEMA   30 tablet  6  . insulin NPH-insulin regular (NOVOLIN 70/30) (70-30) 100 UNIT/ML injection Inject 15 Units into the skin daily with breakfast.        . metoprolol (LOPRESSOR) 50 MG tablet Take 1 tablet (50 mg total) by mouth 2 (two) times daily.  60 tablet  11  . mycophenolate (CELLCEPT) 250 MG capsule Take 250 mg by mouth 2 (two) times daily.        . predniSONE (DELTASONE) 5 MG tablet Take 5 mg by mouth daily.        . sitaGLIPtin (JANUVIA) 100 MG tablet Take 100 mg by mouth daily.        . valACYclovir (VALTREX) 500 MG tablet Take 500 mg by mouth daily. Monday, Wednesday and Friday       . warfarin (COUMADIN) 5 MG tablet Take 5 mg by mouth daily. Take as directed by Anticoagulation clinic        . DISCONTD: metoprolol (LOPRESSOR) 50 MG tablet TAKE 1/2 TAB 3 TIMES A DAY  45 tablet  6  . enoxaparin (LOVENOX) 40 MG/0.4ML SOLN Inject 0.4 mLs (40 mg total) into the skin every 12 (twelve) hours.  7 Syringe  0    Allergies  Allergen Reactions  . Codeine  History   Social History  . Marital Status: Legally Separated    Spouse Name: N/A    Number of Children: N/A  . Years of Education: N/A   Occupational History  . Not on file.   Social History Main Topics  . Smoking status: Former Games developer  . Smokeless tobacco: Not on file  . Alcohol Use: Not on file  . Drug Use: Not on file  . Sexually Active: Not on file   Other Topics Concern  . Not on file   Social History Narrative  . No narrative on file   ROS-  All systems are reviewed and are negative except as outlined in the HPI above   Physical Exam: Filed Vitals:   07/23/11 0917  BP: 150/99  Pulse: 55  Height: 5' (1.524 m)  Weight: 97 lb 1.9 oz (44.053 kg)    GEN- The patient is thin appearing, alert and oriented x 3 today.   Head- normocephalic, atraumatic Eyes-  Sclera clear, conjunctiva pink Ears- hearing intact Oropharynx- clear Neck- supple, no JVP Lymph- no cervical lymphadenopathy Lungs- Clear to ausculation  bilaterally, normal work of breathing Chest- pacemaker pocket is well healed Heart- iregular rate and rhythm, 1/6 diastolic murmur at apex, 2/6 SEM along LSB GI- soft, NT, ND, + BS Extremities- no clubbing, cyanosis, or edema MS- no significant deformity or atrophy  Pacemaker interrogation- reviewed in detail today,  See PACEART report  Assessment and Plan:

## 2011-07-23 NOTE — Assessment & Plan Note (Signed)
Severe MS but with minimal symptoms Aggressive heart rate control is an essential part of her treatment to promote optimal diastolic filling  I will ask Dr Excell Seltzer to see her for long term management of her structural heart disease.

## 2011-08-05 ENCOUNTER — Ambulatory Visit (INDEPENDENT_AMBULATORY_CARE_PROVIDER_SITE_OTHER): Payer: Medicare Other | Admitting: *Deleted

## 2011-08-05 DIAGNOSIS — Z7901 Long term (current) use of anticoagulants: Secondary | ICD-10-CM

## 2011-08-05 DIAGNOSIS — I4891 Unspecified atrial fibrillation: Secondary | ICD-10-CM

## 2011-08-10 ENCOUNTER — Telehealth: Payer: Self-pay | Admitting: *Deleted

## 2011-08-10 NOTE — Telephone Encounter (Signed)
Pt telephoned to inform us that she is on Augmentin that she started on yesterday and also Prednisone 20mg s TID that  She started yesterday as well for 6 days.  Appt S/C for Thursday due to Prednisone.

## 2011-08-13 ENCOUNTER — Ambulatory Visit (INDEPENDENT_AMBULATORY_CARE_PROVIDER_SITE_OTHER): Payer: Medicare Other | Admitting: *Deleted

## 2011-08-13 DIAGNOSIS — Z7901 Long term (current) use of anticoagulants: Secondary | ICD-10-CM

## 2011-08-13 DIAGNOSIS — I4891 Unspecified atrial fibrillation: Secondary | ICD-10-CM

## 2011-09-04 ENCOUNTER — Encounter: Payer: Medicare Other | Admitting: *Deleted

## 2011-09-10 ENCOUNTER — Ambulatory Visit (INDEPENDENT_AMBULATORY_CARE_PROVIDER_SITE_OTHER): Payer: Medicare Other | Admitting: *Deleted

## 2011-09-10 DIAGNOSIS — I4891 Unspecified atrial fibrillation: Secondary | ICD-10-CM

## 2011-09-10 DIAGNOSIS — Z7901 Long term (current) use of anticoagulants: Secondary | ICD-10-CM

## 2011-09-21 ENCOUNTER — Other Ambulatory Visit: Payer: Self-pay | Admitting: Cardiology

## 2011-09-21 ENCOUNTER — Ambulatory Visit (INDEPENDENT_AMBULATORY_CARE_PROVIDER_SITE_OTHER): Payer: Medicare Other | Admitting: *Deleted

## 2011-09-21 DIAGNOSIS — I4891 Unspecified atrial fibrillation: Secondary | ICD-10-CM

## 2011-09-21 DIAGNOSIS — Z7901 Long term (current) use of anticoagulants: Secondary | ICD-10-CM

## 2011-09-21 LAB — POCT INR: INR: 1.5

## 2011-09-21 NOTE — Patient Instructions (Addendum)
Reviewed vitamin k list foods with patient, gave patient a copy.

## 2011-10-01 ENCOUNTER — Encounter: Payer: Medicare Other | Admitting: *Deleted

## 2011-10-06 ENCOUNTER — Ambulatory Visit (INDEPENDENT_AMBULATORY_CARE_PROVIDER_SITE_OTHER): Payer: Medicare Other | Admitting: Pharmacist

## 2011-10-06 DIAGNOSIS — Z7901 Long term (current) use of anticoagulants: Secondary | ICD-10-CM

## 2011-10-06 DIAGNOSIS — I4891 Unspecified atrial fibrillation: Secondary | ICD-10-CM

## 2011-10-17 ENCOUNTER — Other Ambulatory Visit: Payer: Self-pay | Admitting: Cardiology

## 2011-10-22 ENCOUNTER — Encounter: Payer: Medicare Other | Admitting: *Deleted

## 2011-10-22 ENCOUNTER — Ambulatory Visit (INDEPENDENT_AMBULATORY_CARE_PROVIDER_SITE_OTHER): Payer: Medicare Other | Admitting: *Deleted

## 2011-10-22 DIAGNOSIS — I4891 Unspecified atrial fibrillation: Secondary | ICD-10-CM

## 2011-10-22 DIAGNOSIS — Z7901 Long term (current) use of anticoagulants: Secondary | ICD-10-CM

## 2011-10-23 ENCOUNTER — Other Ambulatory Visit: Payer: Self-pay | Admitting: *Deleted

## 2011-10-23 MED ORDER — METOPROLOL TARTRATE 50 MG PO TABS: 50.0000 mg | ORAL_TABLET | Freq: Two times a day (BID) | ORAL | Status: DC

## 2011-10-26 ENCOUNTER — Encounter: Payer: Self-pay | Admitting: *Deleted

## 2011-11-16 ENCOUNTER — Ambulatory Visit (INDEPENDENT_AMBULATORY_CARE_PROVIDER_SITE_OTHER): Payer: Medicare Other

## 2011-11-16 DIAGNOSIS — Z7901 Long term (current) use of anticoagulants: Secondary | ICD-10-CM

## 2011-11-16 DIAGNOSIS — I4891 Unspecified atrial fibrillation: Secondary | ICD-10-CM

## 2011-11-16 LAB — PROTIME-INR
INR: 10.3 ratio (ref 0.8–1.0)
Prothrombin Time: 115.7 s (ref 10.2–12.4)

## 2011-11-16 LAB — POCT INR: INR: 7.8

## 2011-11-16 MED ORDER — PHYTONADIONE 5 MG PO TABS: 2.5000 mg | ORAL_TABLET | Freq: Once | ORAL | Status: DC

## 2011-11-16 NOTE — Patient Instructions (Signed)
Verified with pt she is not bleeding at present, advised to go to ED immediately if any abnormal bleeding occurs. Pt will hold today's dosage of Coumadin, and will continue to hold until Recheck on 11/19/11.

## 2011-11-19 ENCOUNTER — Ambulatory Visit (INDEPENDENT_AMBULATORY_CARE_PROVIDER_SITE_OTHER): Payer: Medicare Other | Admitting: Pharmacist

## 2011-11-19 DIAGNOSIS — Z7901 Long term (current) use of anticoagulants: Secondary | ICD-10-CM

## 2011-11-19 DIAGNOSIS — I4891 Unspecified atrial fibrillation: Secondary | ICD-10-CM

## 2011-11-26 ENCOUNTER — Other Ambulatory Visit: Payer: Self-pay | Admitting: *Deleted

## 2011-11-26 ENCOUNTER — Ambulatory Visit (INDEPENDENT_AMBULATORY_CARE_PROVIDER_SITE_OTHER): Payer: Medicare Other | Admitting: Pharmacist

## 2011-11-26 DIAGNOSIS — I4891 Unspecified atrial fibrillation: Secondary | ICD-10-CM

## 2011-11-26 DIAGNOSIS — Z7901 Long term (current) use of anticoagulants: Secondary | ICD-10-CM

## 2011-11-26 LAB — POCT INR: INR: 2.3

## 2011-11-26 MED ORDER — METOPROLOL TARTRATE 50 MG PO TABS: 50.0000 mg | ORAL_TABLET | Freq: Two times a day (BID) | ORAL | Status: DC

## 2011-12-11 ENCOUNTER — Telehealth: Payer: Self-pay | Admitting: Pharmacist

## 2011-12-11 ENCOUNTER — Ambulatory Visit (INDEPENDENT_AMBULATORY_CARE_PROVIDER_SITE_OTHER): Payer: Medicare Other | Admitting: *Deleted

## 2011-12-11 DIAGNOSIS — I4891 Unspecified atrial fibrillation: Secondary | ICD-10-CM

## 2011-12-11 DIAGNOSIS — Z7901 Long term (current) use of anticoagulants: Secondary | ICD-10-CM

## 2011-12-11 LAB — POCT INR: INR: 4

## 2011-12-11 NOTE — Telephone Encounter (Signed)
Pt's dental work was postponed due to illness.

## 2011-12-11 NOTE — Telephone Encounter (Signed)
Message copied by Velda Shell on Fri Dec 11, 2011  5:25 PM ------      Message from: Hillis Range      Created: Sat Oct 24, 2011 10:05 PM      Regarding: RE: teeth extraction 10/27/2011       She has had a prior stroke and would require a lovenox bridge            ----- Message -----         From: Raul Del, RN         Sent: 10/22/2011   9:58 AM           To: Gardiner Rhyme, MD, Deliah Boston, RN, #      Subject: teeth extraction 10/27/2011                               Patient is having teeth extractions on 10/27/2011 by Dr Chilton Si, he has suggested patient come off coumadin 3 days prior to procedure. Is this OK with you. Please call patient by tomorrow.

## 2011-12-24 ENCOUNTER — Other Ambulatory Visit: Payer: Self-pay | Admitting: Cardiology

## 2011-12-25 ENCOUNTER — Ambulatory Visit (INDEPENDENT_AMBULATORY_CARE_PROVIDER_SITE_OTHER): Payer: Medicare Other | Admitting: *Deleted

## 2011-12-25 DIAGNOSIS — I4891 Unspecified atrial fibrillation: Secondary | ICD-10-CM

## 2011-12-25 DIAGNOSIS — Z7901 Long term (current) use of anticoagulants: Secondary | ICD-10-CM

## 2011-12-25 LAB — POCT INR: INR: 4

## 2011-12-29 ENCOUNTER — Ambulatory Visit (INDEPENDENT_AMBULATORY_CARE_PROVIDER_SITE_OTHER): Payer: Medicare Other | Admitting: *Deleted

## 2011-12-29 DIAGNOSIS — I4891 Unspecified atrial fibrillation: Secondary | ICD-10-CM

## 2011-12-29 DIAGNOSIS — Z7901 Long term (current) use of anticoagulants: Secondary | ICD-10-CM

## 2011-12-29 LAB — POCT INR: INR: 1.8

## 2012-01-06 ENCOUNTER — Telehealth: Payer: Self-pay | Admitting: Pharmacist

## 2012-01-06 ENCOUNTER — Encounter (HOSPITAL_COMMUNITY): Payer: Self-pay | Admitting: Family Medicine

## 2012-01-06 ENCOUNTER — Emergency Department (HOSPITAL_COMMUNITY)
Admission: EM | Admit: 2012-01-06 | Discharge: 2012-01-06 | Disposition: A | Discharge status: Home / Self Care | Payer: Medicare Other | Attending: Emergency Medicine | Admitting: Emergency Medicine

## 2012-01-06 DIAGNOSIS — Z940 Kidney transplant status: Secondary | ICD-10-CM | POA: Insufficient documentation

## 2012-01-06 DIAGNOSIS — R51 Headache: Secondary | ICD-10-CM | POA: Insufficient documentation

## 2012-01-06 DIAGNOSIS — I129 Hypertensive chronic kidney disease with stage 1 through stage 4 chronic kidney disease, or unspecified chronic kidney disease: Secondary | ICD-10-CM | POA: Insufficient documentation

## 2012-01-06 DIAGNOSIS — N189 Chronic kidney disease, unspecified: Secondary | ICD-10-CM | POA: Insufficient documentation

## 2012-01-06 DIAGNOSIS — R300 Dysuria: Secondary | ICD-10-CM | POA: Insufficient documentation

## 2012-01-06 DIAGNOSIS — R5381 Other malaise: Secondary | ICD-10-CM | POA: Insufficient documentation

## 2012-01-06 DIAGNOSIS — E213 Hyperparathyroidism, unspecified: Secondary | ICD-10-CM | POA: Insufficient documentation

## 2012-01-06 DIAGNOSIS — Z8673 Personal history of transient ischemic attack (TIA), and cerebral infarction without residual deficits: Secondary | ICD-10-CM | POA: Insufficient documentation

## 2012-01-06 DIAGNOSIS — R319 Hematuria, unspecified: Secondary | ICD-10-CM | POA: Insufficient documentation

## 2012-01-06 DIAGNOSIS — E119 Type 2 diabetes mellitus without complications: Secondary | ICD-10-CM | POA: Insufficient documentation

## 2012-01-06 LAB — POCT I-STAT, CHEM 8
HCT: 43 % (ref 36.0–46.0)
Hemoglobin: 14.6 g/dL (ref 12.0–15.0)
Potassium: 4.2 mEq/L (ref 3.5–5.1)
Sodium: 136 mEq/L (ref 135–145)

## 2012-01-06 LAB — URINALYSIS, ROUTINE W REFLEX MICROSCOPIC
Nitrite: NEGATIVE
Specific Gravity, Urine: 1.02 (ref 1.005–1.030)
Urobilinogen, UA: 0.2 mg/dL (ref 0.0–1.0)

## 2012-01-06 LAB — PROTIME-INR
INR: 2.3 — ABNORMAL HIGH (ref 0.00–1.49)
Prothrombin Time: 25.7 seconds — ABNORMAL HIGH (ref 11.6–15.2)

## 2012-01-06 LAB — URINE MICROSCOPIC-ADD ON

## 2012-01-06 MED ORDER — NITROFURANTOIN MONOHYD MACRO 100 MG PO CAPS: 100.0000 mg | ORAL_CAPSULE | Freq: Two times a day (BID) | ORAL | Status: AC

## 2012-01-06 MED ORDER — NITROFURANTOIN MONOHYD MACRO 100 MG PO CAPS
100.0000 mg | ORAL_CAPSULE | Freq: Once | ORAL | Status: AC
  Administered 2012-01-06: 100 mg via ORAL
  Filled 2012-01-06: qty 1

## 2012-01-06 NOTE — ED Notes (Signed)
Patient states she started having blood in her urine after midnight. States she takes Coumadin on a daily basis.

## 2012-01-06 NOTE — ED Provider Notes (Signed)
History     CSN: [id number]  Arrival date & time 01/06/12  0058   First MD Initiated Contact with Patient 01/06/12 0202      Chief Complaint  Patient presents with  . Hematuria    (Consider location/radiation/quality/duration/timing/severity/associated sxs/prior treatment) HPI Comments: On coumadin - for afib - has had no problems yesterday - had some dental work done last Wednesday - did not take her off coumadin.  Tuesday noticed some bleeding from gums and then tonight had some blood in urine.  This AM had some fatigue, headache, took a pain pill without improvement.  When used bathroom tonight had blood in urine.    Had subjective f/c at home and some dysuria in last 6 hours, no n/v.  No blood in stools.  Headache has eased up significantly.  No more bleeding from gums since using a tea bag.  Sx are intermittent, gradually worsening.  Patient is a 66 y.o. female presenting with hematuria. The history is provided by the patient.  Hematuria This is a new problem.    Past Medical History  Diagnosis Date  . Diabetes mellitus   . Hypertension   . Chronic kidney disease     s/p renal transplant;  follows with [name]  . Stroke December 2011    left MCA distribution  . Permanent atrial fibrillation     coumadin clinic at [facility]  . Mitral stenosis     echo 12/10: EF 60-65%, mild LVH, mild LAE, mild AS with AVA 1.47 and mean 9, severe MAC, severe MS with mean 7, mod TR, PASP 40  . Aortic stenosis   . Symptomatic bradycardia     s/p pacer  . Depression   . HSV (herpes simplex virus) infection   . Anemia   . PAD (peripheral artery disease)     s/p prior RLE angioplasty with [name]  . Hyperparathyroidism, primary     s/p parathyroidectomy    Past Surgical History  Procedure Date  . Insert / replace / remove pacemaker   . Kidney transplant December 1999    No family history on file.  History  Substance Use Topics  . Smoking status: Former  Games developer  . Smokeless tobacco: Not on file  . Alcohol Use: No    OB History    Grav Para Term Preterm Abortions TAB SAB Ect Mult Living                  Review of Systems  Genitourinary: Positive for hematuria.  All other systems reviewed and are negative.    Allergies  Codeine and Penicillins  Home Medications   Current Outpatient Rx  Name Route Sig Dispense Refill  . NEPHRO-VITE 0.8 MG PO TABS Oral Take 0.8 mg by mouth daily.      . CYCLOSPORINE MODIFIED 25 MG PO CAPS Oral Take 25 mg by mouth 2 (two) times daily.      [name] DIGOXIN 0.125 MG PO TABS  TAKE 1 TABLET EVERY DAY 90 tablet 3  . ERGOCALCIFEROL 50000 UNITS PO CAPS Oral Take 50,000 Units by mouth every 30 (thirty) days.      . FUROSEMIDE 40 MG PO TABS  TAKE 1 TABLET EVERY DAY IF WEIGHT IS INCREASED OR EDEMA 30 tablet 6  . HYDROCODONE-ACETAMINOPHEN 5-325 MG PO TABS Oral Take 1 tablet by mouth every 4 (four) hours as needed.    . INSULIN ISOPHANE & REGULAR (70-30) 100 UNIT/ML [place] SUSP Subcutaneous Inject 15 Units into  the skin daily with breakfast.      . METOPROLOL TARTRATE 50 MG PO TABS Oral Take 1 tablet (50 mg total) by mouth 2 (two) times daily. 60 tablet 11  . MYCOPHENOLATE MOFETIL 250 MG PO CAPS Oral Take 250 mg by mouth 2 (two) times daily.      [name] PHYTONADIONE 5 MG PO TABS Oral Take 0.5 tablets (2.5 mg total) by mouth once. Take 1/2 tablet now. 1 tablet 0  . PREDNISONE 5 MG PO TABS Oral Take 5 mg by mouth daily.      [name] SITAGLIPTIN PHOSPHATE 100 MG PO TABS Oral Take 100 mg by mouth daily.      [name] VALACYCLOVIR HCL 500 MG PO TABS Oral Take 500 mg by mouth daily. Monday, Wednesday and Friday     . WARFARIN SODIUM 5 MG PO TABS Oral Take 2.5 mg by mouth daily.    . AMOXICILLIN 500 MG PO CAPS Oral Take 500 mg by mouth 3 (three) times daily.    [name] NITROFURANTOIN MONOHYD MACRO 100 MG PO CAPS Oral Take 1 capsule (100 mg total) by mouth 2 (two) times daily. 10 capsule 0    BP 111/88  Pulse 68  Temp(Src) 97.4 F (36.3 C) (Oral)   Resp 18  SpO2 100%  Physical Exam  Nursing note and vitals reviewed. Constitutional: She appears well-developed and well-nourished. No distress.  HENT:  Head: Normocephalic and atraumatic.  Mouth/Throat: Oropharynx is clear and moist. No oropharyngeal exudate.       No bleeding in OP or teeth  Eyes: Conjunctivae and EOM are normal. Pupils are equal, round, and reactive to light. Right eye exhibits no discharge. Left eye exhibits no discharge. No scleral icterus.  Neck: Normal range of motion. Neck supple. No JVD present. No thyromegaly present.  Cardiovascular: Normal rate, regular rhythm, normal heart sounds and intact distal pulses.  Exam reveals no gallop and no friction rub.   No murmur heard. Pulmonary/Chest: Effort normal and breath sounds normal. No respiratory distress. She has no wheezes. She has no rales.  Abdominal: Soft. Bowel sounds are normal. She exhibits no distension and no mass. There is no tenderness.  Musculoskeletal: Normal range of motion. She exhibits no edema and no tenderness.  Lymphadenopathy:    She has no cervical adenopathy.  Neurological: She is alert. Coordination normal.  Skin: Skin is warm and dry. No rash noted. No erythema.  Psychiatric: She has a normal mood and affect. Her behavior is normal.    ED Course  Procedures (including critical care time)  Labs Reviewed  PROTIME-INR - Abnormal; Notable for the following:    Prothrombin Time 25.7 (*)    INR 2.30 (*)    All other components within normal limits  URINALYSIS, ROUTINE W REFLEX MICROSCOPIC - Abnormal; Notable for the following:    Color, Urine RED (*) BIOCHEMICALS MAY BE AFFECTED BY COLOR   APPearance CLOUDY (*)    Hgb urine dipstick LARGE (*)    Ketones, ur TRACE (*)    Protein, ur 100 (*)    Leukocytes, UA SMALL (*)    All other components within normal limits  POCT I-STAT, CHEM 8 - Abnormal; Notable for the following:    Creatinine, Ser 1.30 (*)    Glucose, Bld 230 (*)    All  other components within normal limits  URINE MICROSCOPIC-ADD ON   No results found.   1. Hematuria       MDM  No signs of bleeding, has  normal VS - UA appears c/w gross hematuria - check INR, hemoglobin.  Appears stable.  Labs reviewed, hematuria present, no hard signs of infection, INR 2.3 which is appropriate given her level of anticoagulation need. Vital signs are normal, patient appears stable and has been ambulatory and tolerating fluids by mouth. Macrobid started, home with same.  Discharge Prescriptions include:  Lynnell Catalan, MD 01/06/12 [phone]

## 2012-01-06 NOTE — ED Notes (Signed)
Patient ambulatory with steady gait. Respirations equal and unlabored. Skin warm and dry. No distress noted.

## 2012-01-06 NOTE — Discharge Instructions (Signed)
  Your urine sample shows urine but no signs of infection. Because sometimes a urinary infection can manifest as blood in the urine we will treat you with an antibiotic and send your urine for a culture. Otherwise your blood samples show that your Coumadin level is normal as is your hemoglobin. Please expect to have some ongoing blood in your urine for the next couple of days, drink plenty of fluids, return if you're unable to urinate or having severe pain fevers or vomiting.  Hematuria, Adult Hematuria (blood in your urine) can be caused by a bladder infection (cystitis), kidney infection (pyelonephritis), prostate infection (prostatitis), or kidney stone. Infections will usually respond to antibiotics (medications which kill germs), and a kidney stone will usually pass through your urine without further treatment. If you were put on antibiotics, take all the medicine until gone. You may feel better in a few days, but take all of your medicine or the infection may not respond and become more difficult to treat. If antibiotics were not given, an infection did not cause the blood in the urine. A further work up to find out the reason may be needed. HOME CARE INSTRUCTIONS   Drink lots of fluid, 3 to 4 quarts a day. If you have been diagnosed with an infection, cranberry juice is especially recommended, in addition to large amounts of water.   Avoid caffeine, tea, and carbonated beverages, because they tend to irritate the bladder.   Avoid alcohol as it may irritate the prostate.   Only take over-the-counter or prescription medicines for pain, discomfort, or fever as directed by your caregiver.   If you have been diagnosed with a kidney stone follow your caregivers instructions regarding straining your urine to catch the stone.  TO PREVENT FURTHER INFECTIONS:  Empty the bladder often. Avoid holding urine for long periods of time.   After a bowel movement, women should cleanse front to back. Use each  tissue only once.   Empty the bladder before and after sexual intercourse if you are a female.   Return to your caregiver if you develop back pain, fever, nausea (feeling sick to your stomach), vomiting, or your symptoms (problems) are not better in 3 days. Return sooner if you are getting worse.  If you have been requested to return for further testing make sure to keep your appointments. If an infection is not the cause of blood in your urine, X-rays may be required. Your caregiver will discuss this with you. SEEK IMMEDIATE MEDICAL CARE IF:   You have a persistent fever over 102 F (38.9 C).   You develop severe vomiting and are unable to keep the medication down.   You develop severe back or abdominal pain despite taking your medications.   You begin passing a large amount of blood or clots in your urine.   You feel extremely weak or faint, or pass out.  MAKE SURE YOU:   Understand these instructions.   Will watch your condition.   Will get help right away if you are not doing well or get worse.  Document Released: 08/24/2005 Document Revised: 08/13/2011 Document Reviewed: 04/12/2008 [facility] Patient Information 2012 [place], [place].

## 2012-01-06 NOTE — Telephone Encounter (Signed)
Pt called very distraught this morning.  She had to go to ER last night for blood in her urine.  She was diagnosed with a UTI and was placed on Macrobid.  Her INR was therapeutic at 2.3.  She was upset on the phone stating that "this medicine is going to kill her."  She wants to stop her Coumadin.  I tried to explain to patient that the blood in her urine was likely due to the infection; she will just see more than other people due to being on a blood thinner.  She has a history of prior stroke so we discussed the high risk of her having another event if she stops Coumadin.  She would still like to stp the medication.  I told her I would talk to Dr. Johney Frame then get back in touch with her.

## 2012-01-08 ENCOUNTER — Ambulatory Visit (INDEPENDENT_AMBULATORY_CARE_PROVIDER_SITE_OTHER): Payer: Medicare Other | Admitting: Pharmacist

## 2012-01-08 DIAGNOSIS — I4891 Unspecified atrial fibrillation: Secondary | ICD-10-CM

## 2012-01-08 DIAGNOSIS — Z7901 Long term (current) use of anticoagulants: Secondary | ICD-10-CM

## 2012-01-08 LAB — POCT INR: INR: 2.1

## 2012-01-08 NOTE — Telephone Encounter (Signed)
Spoke with pt today.  She is feeling much better and wants to stay on the Coumadin. She was just feeling overwhelmed on Wednesday but has regained her composure at this time.

## 2012-01-22 ENCOUNTER — Ambulatory Visit (INDEPENDENT_AMBULATORY_CARE_PROVIDER_SITE_OTHER): Payer: Medicare Other

## 2012-01-22 DIAGNOSIS — I4891 Unspecified atrial fibrillation: Secondary | ICD-10-CM

## 2012-01-22 DIAGNOSIS — Z7901 Long term (current) use of anticoagulants: Secondary | ICD-10-CM

## 2012-01-25 ENCOUNTER — Other Ambulatory Visit: Payer: Self-pay | Admitting: Cardiology

## 2012-01-26 ENCOUNTER — Other Ambulatory Visit: Payer: Self-pay | Admitting: Pharmacist

## 2012-01-26 MED ORDER — WARFARIN SODIUM 5 MG PO TABS: ORAL_TABLET | ORAL | Status: DC

## 2012-02-01 ENCOUNTER — Encounter: Payer: Self-pay | Admitting: *Deleted

## 2012-02-05 ENCOUNTER — Ambulatory Visit (INDEPENDENT_AMBULATORY_CARE_PROVIDER_SITE_OTHER): Payer: Medicare Other

## 2012-02-05 DIAGNOSIS — Z7901 Long term (current) use of anticoagulants: Secondary | ICD-10-CM

## 2012-02-05 DIAGNOSIS — I4891 Unspecified atrial fibrillation: Secondary | ICD-10-CM

## 2012-02-19 ENCOUNTER — Ambulatory Visit (INDEPENDENT_AMBULATORY_CARE_PROVIDER_SITE_OTHER): Payer: Medicare Other | Admitting: *Deleted

## 2012-02-19 DIAGNOSIS — Z7901 Long term (current) use of anticoagulants: Secondary | ICD-10-CM

## 2012-02-19 DIAGNOSIS — I4891 Unspecified atrial fibrillation: Secondary | ICD-10-CM

## 2012-02-24 ENCOUNTER — Other Ambulatory Visit: Payer: Self-pay | Admitting: Pharmacist

## 2012-02-29 ENCOUNTER — Ambulatory Visit (INDEPENDENT_AMBULATORY_CARE_PROVIDER_SITE_OTHER): Payer: Medicaid Other | Admitting: *Deleted

## 2012-02-29 ENCOUNTER — Encounter: Payer: Self-pay | Admitting: Internal Medicine

## 2012-02-29 DIAGNOSIS — I4891 Unspecified atrial fibrillation: Secondary | ICD-10-CM

## 2012-02-29 DIAGNOSIS — Z950 Presence of cardiac pacemaker: Secondary | ICD-10-CM

## 2012-03-01 ENCOUNTER — Telehealth: Payer: Self-pay | Admitting: Internal Medicine

## 2012-03-01 NOTE — Telephone Encounter (Signed)
New msg Pt wants to know if received transmission. Please call

## 2012-03-01 NOTE — Telephone Encounter (Signed)
Transmission was received.  

## 2012-03-03 LAB — REMOTE PACEMAKER DEVICE
BMOD-0003RV: 30
BMOD-0005RV: 95 {beats}/min
BRDY-0004RV: 130 {beats}/min
RV LEAD AMPLITUDE: 22.4 mv
RV LEAD IMPEDENCE PM: 569 Ohm
VENTRICULAR PACING PM: 27

## 2012-03-04 ENCOUNTER — Ambulatory Visit (INDEPENDENT_AMBULATORY_CARE_PROVIDER_SITE_OTHER): Payer: Medicare Other | Admitting: *Deleted

## 2012-03-04 DIAGNOSIS — I4891 Unspecified atrial fibrillation: Secondary | ICD-10-CM

## 2012-03-04 DIAGNOSIS — Z7901 Long term (current) use of anticoagulants: Secondary | ICD-10-CM

## 2012-03-04 LAB — POCT INR: INR: 6.1

## 2012-03-08 ENCOUNTER — Ambulatory Visit (INDEPENDENT_AMBULATORY_CARE_PROVIDER_SITE_OTHER): Payer: Medicare Other | Admitting: *Deleted

## 2012-03-08 DIAGNOSIS — I4891 Unspecified atrial fibrillation: Secondary | ICD-10-CM

## 2012-03-08 DIAGNOSIS — Z7901 Long term (current) use of anticoagulants: Secondary | ICD-10-CM

## 2012-03-21 ENCOUNTER — Encounter: Payer: Self-pay | Admitting: *Deleted

## 2012-03-24 ENCOUNTER — Ambulatory Visit (INDEPENDENT_AMBULATORY_CARE_PROVIDER_SITE_OTHER): Payer: Medicare Other | Admitting: *Deleted

## 2012-03-24 DIAGNOSIS — I4891 Unspecified atrial fibrillation: Secondary | ICD-10-CM

## 2012-03-24 DIAGNOSIS — Z7901 Long term (current) use of anticoagulants: Secondary | ICD-10-CM

## 2012-03-24 LAB — POCT INR: INR: 4.8

## 2012-04-06 ENCOUNTER — Telehealth: Payer: Self-pay | Admitting: *Deleted

## 2012-04-06 NOTE — Telephone Encounter (Signed)
Pt calls to tell us that she started Septra DS yesterday, 1 pill taken yesterday. BID for 6 doses. Pt has appt tomorrow.

## 2012-04-07 ENCOUNTER — Ambulatory Visit (INDEPENDENT_AMBULATORY_CARE_PROVIDER_SITE_OTHER): Payer: Medicare Other

## 2012-04-07 DIAGNOSIS — Z7901 Long term (current) use of anticoagulants: Secondary | ICD-10-CM

## 2012-04-07 DIAGNOSIS — I4891 Unspecified atrial fibrillation: Secondary | ICD-10-CM

## 2012-04-07 LAB — POCT INR: INR: 6.6

## 2012-04-07 LAB — PROTIME-INR
INR: 7 ratio (ref 0.8–1.0)
Prothrombin Time: 78.4 s (ref 10.2–12.4)

## 2012-04-11 ENCOUNTER — Ambulatory Visit (INDEPENDENT_AMBULATORY_CARE_PROVIDER_SITE_OTHER): Payer: Medicare Other

## 2012-04-11 DIAGNOSIS — I4891 Unspecified atrial fibrillation: Secondary | ICD-10-CM

## 2012-04-11 DIAGNOSIS — Z7901 Long term (current) use of anticoagulants: Secondary | ICD-10-CM

## 2012-04-12 ENCOUNTER — Encounter (HOSPITAL_COMMUNITY): Payer: Self-pay | Admitting: *Deleted

## 2012-04-12 ENCOUNTER — Emergency Department (HOSPITAL_COMMUNITY)
Admission: EM | Admit: 2012-04-12 | Discharge: 2012-04-12 | Disposition: A | Discharge status: Home / Self Care | Payer: Medicare Other | Attending: Emergency Medicine | Admitting: Emergency Medicine

## 2012-04-12 DIAGNOSIS — I080 Rheumatic disorders of both mitral and aortic valves: Secondary | ICD-10-CM | POA: Insufficient documentation

## 2012-04-12 DIAGNOSIS — F329 Major depressive disorder, single episode, unspecified: Secondary | ICD-10-CM | POA: Insufficient documentation

## 2012-04-12 DIAGNOSIS — Z950 Presence of cardiac pacemaker: Secondary | ICD-10-CM | POA: Insufficient documentation

## 2012-04-12 DIAGNOSIS — I4891 Unspecified atrial fibrillation: Secondary | ICD-10-CM | POA: Insufficient documentation

## 2012-04-12 DIAGNOSIS — Z79899 Other long term (current) drug therapy: Secondary | ICD-10-CM | POA: Insufficient documentation

## 2012-04-12 DIAGNOSIS — N189 Chronic kidney disease, unspecified: Secondary | ICD-10-CM | POA: Insufficient documentation

## 2012-04-12 DIAGNOSIS — F3289 Other specified depressive episodes: Secondary | ICD-10-CM | POA: Insufficient documentation

## 2012-04-12 DIAGNOSIS — I129 Hypertensive chronic kidney disease with stage 1 through stage 4 chronic kidney disease, or unspecified chronic kidney disease: Secondary | ICD-10-CM | POA: Insufficient documentation

## 2012-04-12 DIAGNOSIS — Z7901 Long term (current) use of anticoagulants: Secondary | ICD-10-CM | POA: Insufficient documentation

## 2012-04-12 DIAGNOSIS — I739 Peripheral vascular disease, unspecified: Secondary | ICD-10-CM | POA: Insufficient documentation

## 2012-04-12 DIAGNOSIS — Z794 Long term (current) use of insulin: Secondary | ICD-10-CM | POA: Insufficient documentation

## 2012-04-12 DIAGNOSIS — Z8673 Personal history of transient ischemic attack (TIA), and cerebral infarction without residual deficits: Secondary | ICD-10-CM | POA: Insufficient documentation

## 2012-04-12 DIAGNOSIS — E119 Type 2 diabetes mellitus without complications: Secondary | ICD-10-CM | POA: Insufficient documentation

## 2012-04-12 DIAGNOSIS — R197 Diarrhea, unspecified: Secondary | ICD-10-CM

## 2012-04-12 LAB — CBC WITH DIFFERENTIAL/PLATELET
Basophils Absolute: 0 10*3/uL (ref 0.0–0.1)
Basophils Relative: 0 % (ref 0–1)
Eosinophils Absolute: 0.1 10*3/uL (ref 0.0–0.7)
Eosinophils Relative: 1 % (ref 0–5)
Lymphs Abs: 0.9 10*3/uL (ref 0.7–4.0)
MCH: 32 pg (ref 26.0–34.0)
MCV: 95.9 fL (ref 78.0–100.0)
Neutrophils Relative %: 73 % (ref 43–77)
Platelets: 271 10*3/uL (ref 150–400)
RBC: 4.34 MIL/uL (ref 3.87–5.11)
RDW: 14.4 % (ref 11.5–15.5)

## 2012-04-12 LAB — COMPREHENSIVE METABOLIC PANEL
ALT: 30 U/L (ref 0–35)
AST: 34 U/L (ref 0–37)
Albumin: 3.4 g/dL — ABNORMAL LOW (ref 3.5–5.2)
Alkaline Phosphatase: 87 U/L (ref 39–117)
Calcium: 10.1 mg/dL (ref 8.4–10.5)
GFR calc Af Amer: 53 mL/min — ABNORMAL LOW (ref >90)
Glucose, Bld: 63 mg/dL — ABNORMAL LOW (ref 70–99)
Potassium: 4.2 mEq/L (ref 3.5–5.1)
Sodium: 136 mEq/L (ref 135–145)
Total Protein: 7 g/dL (ref 6.0–8.3)

## 2012-04-12 LAB — URINALYSIS, ROUTINE W REFLEX MICROSCOPIC
Glucose, UA: NEGATIVE mg/dL
Hgb urine dipstick: NEGATIVE
Leukocytes, UA: NEGATIVE
Protein, ur: NEGATIVE mg/dL
Specific Gravity, Urine: 1.008 (ref 1.005–1.030)

## 2012-04-12 LAB — APTT: aPTT: 52 seconds — ABNORMAL HIGH (ref 24–37)

## 2012-04-12 MED ORDER — SODIUM CHLORIDE 0.9 % IV SOLN: INTRAVENOUS | Status: DC

## 2012-04-12 MED ORDER — SODIUM CHLORIDE 0.9 % IV SOLN
INTRAVENOUS | Status: DC
  Administered 2012-04-12: 15:00:00 via INTRAVENOUS

## 2012-04-12 MED ORDER — DIPHENOXYLATE-ATROPINE 2.5-0.025 MG PO TABS: 1.0000 | ORAL_TABLET | Freq: Four times a day (QID) | ORAL | Status: AC | PRN

## 2012-04-12 MED ORDER — DIPHENOXYLATE-ATROPINE 2.5-0.025 MG PO TABS
1.0000 | ORAL_TABLET | Freq: Once | ORAL | Status: AC
  Administered 2012-04-12: 1 via ORAL
  Filled 2012-04-12: qty 1

## 2012-04-12 NOTE — ED Notes (Signed)
Pt states "been having diarrhea x 1 month, been seeing my doctor @ [facility] but the blood just started today, I take a blood thinner"

## 2012-04-12 NOTE — ED Provider Notes (Signed)
History     CSN: [id number]  Arrival date & time 04/12/12  1407   First MD Initiated Contact with Patient 04/12/12 1533      Chief Complaint  Patient presents with  . Diarrhea  . bright red blood since this am     pt is on coumadin  . Fever    (Consider location/radiation/quality/duration/timing/severity/associated sxs/prior treatment) Patient is a 66 y.o. female presenting with diarrhea. The history is provided by the patient and medical records.  Diarrhea The primary symptoms include fever, abdominal pain, nausea and diarrhea. Primary symptoms do not include vomiting or rash.  The illness does not include back pain.   66 year old, female, with a history of renal transplant, diabetes, and hypertension, presents to emergency department complaining of diarrhea for greater than a month, associated with approximately 8 pound weight loss, nausea, but no vomiting.  Today.  She noted blood in her stool to so, she came to the emergency department for evaluation.  She states at nighttime.  She has a soaking, sweats.  She is lightheaded, and when she walks.  She has shortness of breath.  She denies blood in her urine, bleeding from her gums or her nose.  She does take Coumadin.  For atrial fibrillation, and prior stroke.  She denies a history of peptic ulcer disease, or alcohol.  Use.  In taking antibiotics for viral diarrhea.  She also takes iron supplements  Past Medical History  Diagnosis Date  . Diabetes mellitus   . Hypertension   . Chronic kidney disease     s/p renal transplant;  follows with [name]  . Stroke December 2011    left MCA distribution  . Permanent atrial fibrillation     coumadin clinic at [facility]  . Mitral stenosis     echo 12/10: EF 60-65%, mild LVH, mild LAE, mild AS with AVA 1.47 and mean 9, severe MAC, severe MS with mean 7, mod TR, PASP 40  . Aortic stenosis   . Symptomatic bradycardia     s/p pacer  . Depression   . HSV (herpes simplex  virus) infection   . Anemia   . PAD (peripheral artery disease)     s/p prior RLE angioplasty with [name]  . Hyperparathyroidism, primary     s/p parathyroidectomy    Past Surgical History  Procedure Date  . Insert / replace / remove pacemaker   . Kidney transplant December 1999    No family history on file.  History  Substance Use Topics  . Smoking status: Former Games developer  . Smokeless tobacco: Not on file  . Alcohol Use: No    OB History    Grav Para Term Preterm Abortions TAB SAB Ect Mult Living                  Review of Systems  Constitutional: Positive for fever, diaphoresis and unexpected weight change.  HENT: Negative for nosebleeds.   Eyes: Negative for visual disturbance.  Respiratory: Positive for shortness of breath. Negative for cough, chest tightness and wheezing.        Dyspnea on exertion  Cardiovascular: Negative for chest pain and leg swelling.  Gastrointestinal: Positive for nausea, abdominal pain, diarrhea and blood in stool. Negative for vomiting.  Genitourinary: Negative for hematuria.  Musculoskeletal: Negative for back pain.  Skin: Negative for rash.  Neurological: Positive for light-headedness. Negative for headaches.  Hematological: Negative for adenopathy.  Psychiatric/Behavioral: Negative for confusion.  All other systems reviewed  and are negative.    Allergies  Codeine and Penicillins  Home Medications   Current Outpatient Rx  Name Route Sig Dispense Refill  . ACETAMINOPHEN 500 MG PO TABS Oral Take 500 mg by mouth every 6 (six) hours as needed. For pain    . NEPHRO-VITE 0.8 MG PO TABS Oral Take 0.8 mg by mouth daily.      [name] CALCIUM CARBONATE-VITAMIN D 600-400 MG-UNIT PO TABS Oral Take 1 tablet by mouth daily.    . CYCLOSPORINE MODIFIED (NEORAL) 25 MG PO CAPS Oral Take 25 mg by mouth 2 (two) times daily.      [name] DEXTROMETHORPHAN-GUAIFENESIN 10-200 MG PO CAPS Oral Take 1 tablet by mouth every 6 (six) hours as needed. For cold or  cough    . DIGOXIN 0.125 MG PO TABS Oral Take 0.125 mg by mouth daily.    . ERGOCALCIFEROL 50000 UNITS PO CAPS Oral Take 50,000 Units by mouth every 30 (thirty) days.      . INSULIN ISOPHANE & REGULAR (70-30) 100 UNIT/ML Haines SUSP Subcutaneous Inject 10 Units into the skin daily with breakfast.     . METOPROLOL TARTRATE 50 MG PO TABS Oral Take 1 tablet (50 mg total) by mouth 2 (two) times daily. 60 tablet 11  . MYCOPHENOLATE MOFETIL 250 MG PO CAPS Oral Take 250 mg by mouth 2 (two) times daily.      [name] PREDNISONE 5 MG PO TABS Oral Take 5 mg by mouth daily.      [name] SITAGLIPTIN PHOSPHATE 100 MG PO TABS Oral Take 100 mg by mouth daily.      [name] VALACYCLOVIR HCL 500 MG PO TABS Oral Take 500 mg by mouth daily. Monday, Wednesday and Friday     . WARFARIN SODIUM 5 MG PO TABS Oral Take 2.5 mg by mouth daily. Take as directed by Anticoagulation clinic      BP 150/86  Pulse 66  Temp 97.9 F (36.6 C) (Oral)  Resp 14  Wt 90 lb (40.824 kg)  SpO2 99%  Physical Exam  Nursing note and vitals reviewed. Constitutional: She is oriented to person, place, and time. No distress.       Cachectic  HENT:  Head: Normocephalic and atraumatic.  Eyes: Conjunctivae are normal. No scleral icterus.  Neck: Normal range of motion. Neck supple.  Cardiovascular: Intact distal pulses.   No murmur heard.      Regular with ectopic beats  Pulmonary/Chest: Effort normal. No respiratory distress. She has no rales.  Abdominal: Soft. She exhibits no distension and no mass. There is tenderness. There is no rebound and no guarding.       Mild diffuse tenderness, with no peritoneal signs  Genitourinary:       Rectal examination performed with female chaperone.  Black stool consistent with iron supplements  Musculoskeletal: Normal range of motion. She exhibits no edema.  Neurological: She is alert and oriented to person, place, and time.  Skin: Skin is warm and dry.  Psychiatric: She has a normal mood and affect. Thought content  normal.    ED Course  Procedures (including critical care time) 66 year old, female, with diarrhea for approximately one month and blood in her stool.  Today.  She is taking Coumadin and has had approximately 8 pound weight loss and soaking, diaphoresis, as well.  We will check her laboratory tests, including a PT, INR, and also concerned that she could have a malignancy, given her age, weight loss, and night sweats   Labs  Reviewed  OCCULT BLOOD, POC DEVICE  CBC WITH DIFFERENTIAL  COMPREHENSIVE METABOLIC PANEL  PROTIME-INR  URINALYSIS, ROUTINE W REFLEX MICROSCOPIC  APTT   No results found.   No diagnosis found.  6:23 PM sxs improved.  Explained findings and plan. Pt understands and agrees.    MDM  Diarrhea, with bloody stool, weight loss, and night sweats  Heme neg in ed. No significant anemia. No hypotension or tachycardia.        Cheri Guppy, MD 04/12/12 [phone]

## 2012-04-18 ENCOUNTER — Encounter: Payer: Self-pay | Admitting: Internal Medicine

## 2012-04-19 ENCOUNTER — Ambulatory Visit (INDEPENDENT_AMBULATORY_CARE_PROVIDER_SITE_OTHER): Payer: Medicare Other | Admitting: Pharmacist

## 2012-04-19 DIAGNOSIS — Z7901 Long term (current) use of anticoagulants: Secondary | ICD-10-CM

## 2012-04-19 DIAGNOSIS — I4891 Unspecified atrial fibrillation: Secondary | ICD-10-CM

## 2012-04-19 MED ORDER — WARFARIN SODIUM 2.5 MG PO TABS: ORAL_TABLET | ORAL | Status: DC

## 2012-04-22 ENCOUNTER — Ambulatory Visit (INDEPENDENT_AMBULATORY_CARE_PROVIDER_SITE_OTHER): Payer: Medicare Other | Admitting: Pharmacist

## 2012-04-22 DIAGNOSIS — I4891 Unspecified atrial fibrillation: Secondary | ICD-10-CM

## 2012-04-22 DIAGNOSIS — Z7901 Long term (current) use of anticoagulants: Secondary | ICD-10-CM

## 2012-05-06 ENCOUNTER — Ambulatory Visit (INDEPENDENT_AMBULATORY_CARE_PROVIDER_SITE_OTHER): Payer: Medicare Other | Admitting: *Deleted

## 2012-05-06 DIAGNOSIS — I4891 Unspecified atrial fibrillation: Secondary | ICD-10-CM

## 2012-05-06 DIAGNOSIS — Z7901 Long term (current) use of anticoagulants: Secondary | ICD-10-CM

## 2012-05-16 ENCOUNTER — Encounter: Payer: Self-pay | Admitting: Internal Medicine

## 2012-05-17 ENCOUNTER — Encounter: Payer: Self-pay | Admitting: Internal Medicine

## 2012-05-17 ENCOUNTER — Ambulatory Visit (INDEPENDENT_AMBULATORY_CARE_PROVIDER_SITE_OTHER): Payer: Medicare Other | Admitting: Internal Medicine

## 2012-05-17 VITALS — BP 118/70 | HR 64 | Ht 60.0 in | Wt 93.6 lb

## 2012-05-17 DIAGNOSIS — R140 Abdominal distension (gaseous): Secondary | ICD-10-CM

## 2012-05-17 DIAGNOSIS — R141 Gas pain: Secondary | ICD-10-CM

## 2012-05-17 DIAGNOSIS — K5900 Constipation, unspecified: Secondary | ICD-10-CM

## 2012-05-17 DIAGNOSIS — R630 Anorexia: Secondary | ICD-10-CM

## 2012-05-17 DIAGNOSIS — Z1211 Encounter for screening for malignant neoplasm of colon: Secondary | ICD-10-CM

## 2012-05-17 MED ORDER — ALIGN PO CAPS: 1.0000 | ORAL_CAPSULE | Freq: Every day | ORAL | Status: AC

## 2012-05-17 MED ORDER — POLYETHYLENE GLYCOL 3350 17 GM/SCOOP PO POWD: 17.0000 g | Freq: Every day | ORAL | Status: AC

## 2012-05-17 MED ORDER — DOCUSATE SODIUM 100 MG PO CAPS: 100.0000 mg | ORAL_CAPSULE | Freq: Every day | ORAL | Status: DC | PRN

## 2012-05-17 NOTE — Progress Notes (Addendum)
Patient ID: [name] [name], female   DOB: August 12, 1946, 66 y.o.   MRN: [id number]  SUBJECTIVE: HPI [name] [name] is a 66 yo female with hx of ESRD s/p renal transplant 13 years ago, diabetes, hypertension, remote CVA, mitral stenosis, and PAD who is seen in consultation at the request of Dr. Melven Sartorius for evaluation of diarrhea and abdominal bloating. The patient reports in August 2013 around the time she was treated with trimethoprim sulfamethoxazole for a UTI she developed moderate to severe abdominal complaints. She reports developing anorexia, nausea with intermittent vomiting, and diarrhea. She associates this with using Bactrim for her UTI. These symptoms started acutely, and prior to these symptoms her bowel habits were normal for her, though she does report issues with constipation since her renal transplantation. She's not taking any laxatives routinely.  During her illness she reported greenish stools which progressed to diarrhea. At this time she was having fevers along with night sweats. She reports her anorexia was severe and she lost from about 98 pounds to 89 pounds. She had associated abdominal gas and bloating. The symptoms slowly resolved over the past week she has been doing better. She still has some mild anorexia, but has been able to gain about 4 pounds back. She is no longer having diarrhea. No further nausea and vomiting. Her fatigue has improved somewhat. She does still note some mild bloating but not as severe. She is back to having some issues with constipation but no rectal bleeding or melena. During the time of her GI illness she was having night sweats, but these have resolved for the most part.  Over the last several days she has developed URI-type symptoms including runny nose, body aches, mild sore throat, low-grade fever. She plans to discuss this with her PCP. She has noted return of diaphoresis and night sweats with this acute illness, but no return of her GI symptoms.  She had  a colonoscopy approximately 4 years ago performed by [facility]. These records are requested and pending  Review of Systems  As per history of present illness, otherwise negative   Past Medical History  Diagnosis Date  . Diabetes mellitus   . Hypertension   . Chronic kidney disease     s/p renal transplant;  follows with [name]  . Stroke December 2011    left MCA distribution  . Permanent atrial fibrillation     coumadin clinic at [facility]  . Mitral stenosis     echo 12/10: EF 60-65%, mild LVH, mild LAE, mild AS with AVA 1.47 and mean 9, severe MAC, severe MS with mean 7, mod TR, PASP 40  . Aortic stenosis   . Symptomatic bradycardia     s/p pacer  . Depression   . HSV (herpes simplex virus) infection   . Anemia   . PAD (peripheral artery disease)     s/p prior RLE angioplasty with [name]  . Hyperparathyroidism, primary     s/p parathyroidectomy    Current Outpatient Prescriptions  Medication Sig Dispense Refill  . acetaminophen (TYLENOL) 500 MG tablet Take 500 mg by mouth every 6 (six) hours as needed. For pain      . b complex-vitamin c-folic acid (NEPHRO-VITE) 0.8 MG TABS Take 0.8 mg by mouth daily.        . Calcium Carbonate-Vitamin D (CALTRATE 600+D) 600-400 MG-UNIT per tablet Take 1 tablet by mouth daily.      . cycloSPORINE modified (NEORAL/GENGRAF) 25 MG capsule Take 25 mg  by mouth 2 (two) times daily.        [name] Dextromethorphan-Guaifenesin (CORICIDIN HBP CONGESTION/COUGH) 10-200 MG CAPS Take 1 tablet by mouth every 6 (six) hours as needed. For cold or cough      . digoxin (LANOXIN) 0.125 MG tablet Take 0.125 mg by mouth daily.      . ergocalciferol (VITAMIN D2) 50000 UNITS capsule Take 50,000 Units by mouth every 30 (thirty) days.        . insulin NPH-insulin regular (NOVOLIN 70/30) (70-30) 100 UNIT/ML injection Inject 10 Units into the skin daily with breakfast.       . Iron 66 MG TABS Take 1 tablet by mouth daily.      . metoprolol (LOPRESSOR)  50 MG tablet Take 1 tablet (50 mg total) by mouth 2 (two) times daily.  60 tablet  11  . mycophenolate (CELLCEPT) 250 MG capsule Take 250 mg by mouth 2 (two) times daily.        . predniSONE (DELTASONE) 5 MG tablet Take 5 mg by mouth daily.        . sitaGLIPtin (JANUVIA) 100 MG tablet Take 100 mg by mouth daily.        . valACYclovir (VALTREX) 500 MG tablet Take 500 mg by mouth daily. Monday, Wednesday and Friday       . warfarin (COUMADIN) 2.5 MG tablet Take as directed by coumadin clinic  30 tablet  3  . bifidobacterium infantis (ALIGN) capsule Take 1 capsule by mouth daily.  21 capsule  0  . docusate sodium (COLACE) 100 MG capsule Take 1 capsule (100 mg total) by mouth daily as needed for constipation.  30 capsule  1  . polyethylene glycol powder (GLYCOLAX/MIRALAX) powder Take 17 g by mouth daily.  255 g  3    Allergies  Allergen Reactions  . Bactrim (Sulfamethoxazole W-Trimethoprim)   . Codeine Hives and Nausea Only  . Penicillins Nausea And Vomiting    Family History  Problem Relation Age of Onset  . Kidney disease Brother     died from  . Kidney disease Sister     died from    History  Substance Use Topics  . Smoking status: Former Games developer  . Smokeless tobacco: Never Used  . Alcohol Use: No    OBJECTIVE: BP 118/70  Pulse 64  Ht 5' (1.524 m)  Wt 93 lb 9.6 oz (42.457 kg)  BMI 18.28 kg/m2  SpO2 99% Constitutional: Well-developed and well-nourished. No distress. HEENT: Normocephalic and atraumatic. Oropharynx is clear and moist. No oropharyngeal exudate. Conjunctivae are normal. Pupils are equal round and reactive to light. No scleral icterus. Neck: Neck supple. Trachea midline. Cardiovascular: Irregularly irregular, 2/6 SEM Pulmonary/chest: Effort normal and breath sounds normal. No wheezing, rales or rhonchi. Abdominal: Soft, thin, nontender, nondistended. Bowel sounds active throughout. Transplant kidney right lower pelvis and nontender. No  hepatosplenomegaly. Extremities: no clubbing, cyanosis, or edema, previous fistulas bilateral upper extremity Lymphadenopathy: No cervical adenopathy noted. Neurological: Alert and oriented to person place and time. Skin: Skin is warm and dry. No rashes noted. Psychiatric: Normal mood and affect. Behavior is normal.  Labs and Imaging -- CBC    Component Value Date/Time   WBC 6.3 04/12/2012 1557   RBC 4.34 04/12/2012 1557   HGB 13.9 04/12/2012 1557   HCT 41.6 04/12/2012 1557   PLT 271 04/12/2012 1557   MCV 95.9 04/12/2012 1557   MCH 32.0 04/12/2012 1557   MCHC 33.4 04/12/2012 1557   RDW 14.4 04/12/2012  1557   LYMPHSABS 0.9 04/12/2012 1557   MONOABS 0.8 04/12/2012 1557   EOSABS 0.1 04/12/2012 1557   BASOSABS 0.0 04/12/2012 1557    CMP     Component Value Date/Time   NA 136 04/12/2012 1557   K 4.2 04/12/2012 1557   CL 100 04/12/2012 1557   CO2 25 04/12/2012 1557   GLUCOSE 63* 04/12/2012 1557   BUN 10 04/12/2012 1557   CREATININE 1.22* 04/12/2012 1557   CALCIUM 10.1 04/12/2012 1557   PROT 7.0 04/12/2012 1557   ALBUMIN 3.4* 04/12/2012 1557   AST 34 04/12/2012 1557   ALT 30 04/12/2012 1557   ALKPHOS 87 04/12/2012 1557   BILITOT 0.7 04/12/2012 1557   GFRNONAA 46* 04/12/2012 1557   GFRAA 53* 04/12/2012 1557    ASSESSMENT AND PLAN: 66 yo female with hx of ESRD s/p renal transplant 13 years ago, diabetes, hypertension, remote CVA, mitral stenosis, and PAD who is seen in consultation at the request of Dr. Melven Sartorius for evaluation of diarrhea, abdominal bloating anorexia, now mostly resolved.  1. Acute GI illness -- for the most part her symptoms have greatly improved, though she still has some mild anorexia. She's no longer having nausea, vomiting, or diarrhea. It is difficult to know if this was infectious, possibly viral, or a side effect of the Bactrim (felt less likely). I expect her residual symptoms are somewhat related to postinfectious irritable bowel type issues. I am encouraged that she's been able to gain some weight and is  feeling much better from a GI standpoint now.  It is also encouraging that she has had prior colonoscopy, and we will request these records for review to see when she will be due a repeat screening exam. Given the resolution of her diarrhea, I do not think endoscopy is warranted at present. For now would like to treat her abdominal bloating and constipation with the probiotic, specifically Align one capsule daily. I've also recommended docusate 100 mg daily as a stool softener. If this fails to improve her constipation, and I have recommended MiraLAX 17 g daily. She understands this recommendation and is happy with this plan. I will see her back in 4 weeks' time to ensure her anorexia has resolved and her weight has returned to normal. We can also reassess her constipation at that time, and should have her colonoscopy reviewed by that point to better determine when she is due for repeat screening.  She will return in one month or sooner if needed   Addendum: Colonoscopy records received from [facility] Colonoscopy performed 03/15/2008 by Dr. Evette Cristal -- exam to the cecum with fair bowel preparation. 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 The exam is otherwise without abnormality.  Recommendation: Repeat colonoscopy in 10 years for screening purposes --Will place a recall for July 2019, unless symptoms warrant earlier examination

## 2012-05-17 NOTE — Patient Instructions (Addendum)
We have sent the following medications to your pharmacy for you to pick up at your convenience: colace, and if you are still having constipation take Mirilax 17g daily  You have been given samples of Align, take on capsule daily  Follow up with Dr. Rhea Belton in 4 weeks

## 2012-05-23 ENCOUNTER — Other Ambulatory Visit: Payer: Self-pay | Admitting: Gynecology

## 2012-05-23 ENCOUNTER — Other Ambulatory Visit: Payer: Self-pay | Admitting: Nephrology

## 2012-05-23 DIAGNOSIS — Z1231 Encounter for screening mammogram for malignant neoplasm of breast: Secondary | ICD-10-CM

## 2012-05-30 ENCOUNTER — Ambulatory Visit (INDEPENDENT_AMBULATORY_CARE_PROVIDER_SITE_OTHER): Payer: Medicare Other | Admitting: *Deleted

## 2012-05-30 DIAGNOSIS — I4891 Unspecified atrial fibrillation: Secondary | ICD-10-CM

## 2012-05-30 DIAGNOSIS — Z7901 Long term (current) use of anticoagulants: Secondary | ICD-10-CM

## 2012-05-30 LAB — POCT INR: INR: 2.2

## 2012-06-01 DIAGNOSIS — Z1211 Encounter for screening for malignant neoplasm of colon: Secondary | ICD-10-CM | POA: Insufficient documentation

## 2012-06-06 ENCOUNTER — Encounter: Payer: Medicare Other | Admitting: *Deleted

## 2012-06-08 ENCOUNTER — Encounter: Payer: Self-pay | Admitting: Internal Medicine

## 2012-06-08 ENCOUNTER — Ambulatory Visit (INDEPENDENT_AMBULATORY_CARE_PROVIDER_SITE_OTHER): Payer: Medicare Other | Admitting: *Deleted

## 2012-06-08 DIAGNOSIS — Z950 Presence of cardiac pacemaker: Secondary | ICD-10-CM

## 2012-06-08 DIAGNOSIS — I4891 Unspecified atrial fibrillation: Secondary | ICD-10-CM

## 2012-06-10 LAB — REMOTE PACEMAKER DEVICE
BMOD-0003RV: 30
BRDY-0004RV: 130 {beats}/min
RV LEAD THRESHOLD: 0.5 V
VENTRICULAR PACING PM: 27

## 2012-06-13 ENCOUNTER — Ambulatory Visit
Admission: RE | Admit: 2012-06-13 | Discharge: 2012-06-13 | Disposition: A | Discharge status: Home / Self Care | Payer: Medicare Other | Source: Ambulatory Visit | Attending: Gynecology | Admitting: Gynecology

## 2012-06-13 ENCOUNTER — Telehealth: Payer: Self-pay | Admitting: Internal Medicine

## 2012-06-13 ENCOUNTER — Other Ambulatory Visit: Payer: Self-pay | Admitting: Gynecology

## 2012-06-13 DIAGNOSIS — Z1231 Encounter for screening mammogram for malignant neoplasm of breast: Secondary | ICD-10-CM

## 2012-06-13 NOTE — Telephone Encounter (Signed)
plz return call to pt [phone] regarding questions about remote transmission

## 2012-06-13 NOTE — Telephone Encounter (Signed)
Transmission received, patient aware. 

## 2012-06-15 ENCOUNTER — Encounter: Payer: Self-pay | Admitting: Internal Medicine

## 2012-06-17 ENCOUNTER — Encounter: Payer: Self-pay | Admitting: Internal Medicine

## 2012-06-17 ENCOUNTER — Ambulatory Visit (INDEPENDENT_AMBULATORY_CARE_PROVIDER_SITE_OTHER): Payer: Medicare Other | Admitting: Internal Medicine

## 2012-06-17 VITALS — BP 110/80 | HR 68 | Ht 60.0 in | Wt 94.2 lb

## 2012-06-17 DIAGNOSIS — R142 Eructation: Secondary | ICD-10-CM

## 2012-06-17 DIAGNOSIS — K5900 Constipation, unspecified: Secondary | ICD-10-CM

## 2012-06-17 DIAGNOSIS — K529 Noninfective gastroenteritis and colitis, unspecified: Secondary | ICD-10-CM

## 2012-06-17 DIAGNOSIS — R140 Abdominal distension (gaseous): Secondary | ICD-10-CM

## 2012-06-17 DIAGNOSIS — R141 Gas pain: Secondary | ICD-10-CM

## 2012-06-17 MED ORDER — DOCUSATE SODIUM 100 MG PO CAPS: 100.0000 mg | ORAL_CAPSULE | Freq: Every day | ORAL | Status: AC | PRN

## 2012-06-17 NOTE — Progress Notes (Signed)
  Subjective:    Patient ID: [name] [name], female    DOB: 1945-10-06, 66 y.o.   MRN: [id number]  HPI Mrs. Pegues is a 66 yo female with PMH of ESRD s/p renal transplant 13 years ago, diabetes, hypertension, remote CVA, mitral stenosis, and PAD who is seen in follow-up. She was last seen on 05/17/2012 when she was recovering from what was felt to be an acute GI illness, likely viral gastroenteritis. We recommended align to regulate her digestion and Colace to be used for mild constipation. She returns today reporting that she is doing very well now and has had resolution of her GI issues.  Her appetite has returned to normal and she considers a "good". She's had no further nausea or vomiting. She has no heartburn or swallowing without difficulty. She is having normal bowel habits and having a soft but formed bowel movement about 3 times a day. This is normal for her. She is using align daily and has found this to be helpful. She's also noticed decrease bowel bloating and gas. She's had no fevers or chills.   Review of Systems As per history of present illness, otherwise negative  Current Medications, Allergies, Past Medical History, Past Surgical History, Family History and Social History were reviewed in [employer] record.     Objective:   Physical Exam BP 110/80  Pulse 68  Ht 5' (1.524 m)  Wt 94 lb 3.2 oz (42.729 kg)  BMI 18.40 kg/m2 Constitutional: Well-developed and well-nourished. No distress. HEENT: Normocephalic and atraumatic. Oropharynx is clear and moist. No oropharyngeal exudate. Conjunctivae are normal.  No scleral icterus. Neck: Neck supple. Trachea midline. Cardiovascular: Normal rate, regular rhythm and intact distal pulses. No M/R/G Pulmonary/chest: Effort normal and breath sounds normal. No wheezing, rales or rhonchi. Abdominal: Soft, nontender, nondistended. Bowel sounds active throughout. Well-healed abdominal scar Extremities: no clubbing,  cyanosis, or edema Lymphadenopathy: No cervical adenopathy noted. Neurological: Alert and oriented to person place and time. Skin: Skin is warm and dry. No rashes noted. Psychiatric: Normal mood and affect. Behavior is normal.     Assessment & Plan:  66 yo female with PMH of ESRD s/p renal transplant 13 years ago, diabetes, hypertension, remote CVA, mitral stenosis, and PAD who is seen in follow-up.   1.  Resolved gastroenteritis -- the patient's symptoms have resolved entirely and she is feeling well. She would like to continue Align one capsule daily, and I feel that this is fine.  He also will continue to use docusate 100 mg at bedtime as needed for mild constipation. She is asked to call us if any GI symptoms return or bother her. She voices understanding.  2.  CRC screening -- she had a screening colonoscopy performed on 03/15/2008 and is due repeat colonoscopy for average risk screening around July 2019.  She is aware of this recommendation.

## 2012-06-17 NOTE — Patient Instructions (Addendum)
We have sent the following medications to your pharmacy for you to pick up at your convenience: Colace; please take as directed  Follow up with Dr. Rhea Belton as needed

## 2012-06-23 ENCOUNTER — Encounter: Payer: Self-pay | Admitting: *Deleted

## 2012-06-27 ENCOUNTER — Ambulatory Visit (INDEPENDENT_AMBULATORY_CARE_PROVIDER_SITE_OTHER): Payer: Medicare Other

## 2012-06-27 DIAGNOSIS — Z7901 Long term (current) use of anticoagulants: Secondary | ICD-10-CM

## 2012-06-27 DIAGNOSIS — I4891 Unspecified atrial fibrillation: Secondary | ICD-10-CM

## 2012-07-25 ENCOUNTER — Ambulatory Visit (INDEPENDENT_AMBULATORY_CARE_PROVIDER_SITE_OTHER): Payer: Medicare Other | Admitting: *Deleted

## 2012-07-25 ENCOUNTER — Encounter: Payer: Self-pay | Admitting: Internal Medicine

## 2012-07-25 ENCOUNTER — Ambulatory Visit (INDEPENDENT_AMBULATORY_CARE_PROVIDER_SITE_OTHER): Payer: Medicare Other | Admitting: Internal Medicine

## 2012-07-25 VITALS — BP 132/98 | HR 66 | Ht 60.0 in | Wt 93.6 lb

## 2012-07-25 DIAGNOSIS — R0989 Other specified symptoms and signs involving the circulatory and respiratory systems: Secondary | ICD-10-CM

## 2012-07-25 DIAGNOSIS — I495 Sick sinus syndrome: Secondary | ICD-10-CM

## 2012-07-25 DIAGNOSIS — Z7901 Long term (current) use of anticoagulants: Secondary | ICD-10-CM

## 2012-07-25 DIAGNOSIS — I050 Rheumatic mitral stenosis: Secondary | ICD-10-CM

## 2012-07-25 DIAGNOSIS — I519 Heart disease, unspecified: Secondary | ICD-10-CM

## 2012-07-25 DIAGNOSIS — I4891 Unspecified atrial fibrillation: Secondary | ICD-10-CM

## 2012-07-25 LAB — PACEMAKER DEVICE OBSERVATION
BMOD-0003RV: 30
BRDY-0004RV: 130 {beats}/min
RV LEAD AMPLITUDE: 22.4 mv
VENTRICULAR PACING PM: 28

## 2012-07-25 NOTE — Assessment & Plan Note (Signed)
Stable No change required today  bmet and CBC today

## 2012-07-25 NOTE — Assessment & Plan Note (Signed)
Permanent afib Continue rate control and coumadin long term

## 2012-07-25 NOTE — Progress Notes (Signed)
The patient presents today for routine electrophysiology followup.  Since last being seen in our clinic, the patient reports doing reasonably well.  She has severe MS but is minimally symptomatic.  She remains active.  She has occasional leg cramps. Today, she denies symptoms of palpitations, chest pain, shortness of breath, orthopnea, PND, lower extremity edema, dizziness, presyncope, syncope, or neurologic sequela.  The patient feels that she is tolerating medications without difficulties and is otherwise without complaint today.   Past Medical History  Diagnosis Date  . Diabetes mellitus   . Hypertension   . Chronic kidney disease     s/p renal transplant;  follows with [name]  . Stroke December 2011    left MCA distribution  . Permanent atrial fibrillation     coumadin clinic at [facility]  . Mitral stenosis     echo 12/10: EF 60-65%, mild LVH, mild LAE, mild AS with AVA 1.47 and mean 9, severe MAC, severe MS with mean 7, mod TR, PASP 40  . Aortic stenosis   . Symptomatic bradycardia     s/p pacer  . Depression   . HSV (herpes simplex virus) infection   . Anemia   . PAD (peripheral artery disease)     s/p prior RLE angioplasty with [name]  . Hyperparathyroidism, primary     s/p parathyroidectomy   Past Surgical History  Procedure Date  . Insert / replace / remove pacemaker   . Kidney transplant December 1999    Current Outpatient Prescriptions  Medication Sig Dispense Refill  . acetaminophen (TYLENOL) 500 MG tablet Take 500 mg by mouth every 6 (six) hours as needed. For pain      . b complex-vitamin c-folic acid (NEPHRO-VITE) 0.8 MG TABS Take 0.8 mg by mouth daily.        . bifidobacterium infantis (ALIGN) capsule Take 1 capsule by mouth daily.  21 capsule  0  . Calcium Carbonate-Vitamin D (CALTRATE 600+D) 600-400 MG-UNIT per tablet Take 1 tablet by mouth daily.      . cycloSPORINE modified (NEORAL/GENGRAF) 25 MG capsule Take 25 mg by mouth 2 (two)  times daily.        [name] Dextromethorphan-Guaifenesin (CORICIDIN HBP CONGESTION/COUGH) 10-200 MG CAPS Take 1 tablet by mouth every 6 (six) hours as needed. For cold or cough      . digoxin (LANOXIN) 0.125 MG tablet Take 0.125 mg by mouth daily.      [name] docusate sodium (COLACE) 100 MG capsule Take 1 capsule (100 mg total) by mouth daily as needed for constipation.  30 capsule  1  . ergocalciferol (VITAMIN D2) 50000 UNITS capsule Take 50,000 Units by mouth every 30 (thirty) days.        . insulin NPH-insulin regular (NOVOLIN 70/30) (70-30) 100 UNIT/ML injection Inject 10 Units into the skin daily with breakfast.       . Iron 66 MG TABS Take 1 tablet by mouth daily.      . metoprolol (LOPRESSOR) 50 MG tablet Take 1 tablet (50 mg total) by mouth 2 (two) times daily.  60 tablet  11  . mycophenolate (CELLCEPT) 250 MG capsule Take 250 mg by mouth 2 (two) times daily.        . ondansetron (ZOFRAN) 8 MG tablet Take by mouth every 8 (eight) hours as needed.      . predniSONE (DELTASONE) 5 MG tablet Take 5 mg by mouth daily.        . sitaGLIPtin (JANUVIA) 100  MG tablet Take 100 mg by mouth daily.        . valACYclovir (VALTREX) 500 MG tablet Take 500 mg by mouth daily. Monday, Wednesday and Friday       . warfarin (COUMADIN) 2.5 MG tablet Take as directed by coumadin clinic  30 tablet  3    Allergies  Allergen Reactions  . Bactrim (Sulfamethoxazole W-Trimethoprim)   . Codeine Hives and Nausea Only  . Penicillins Nausea And Vomiting    History   Social History  . Marital Status: Legally Separated    Spouse Name: N/A    Number of Children: N/A  . Years of Education: N/A   Occupational History  . Not on file.   Social History Main Topics  . Smoking status: Former Games developer  . Smokeless tobacco: Never Used  . Alcohol Use: No  . Drug Use: No  . Sexually Active: No   Other Topics Concern  . Not on file   Social History Narrative  . No narrative on file    Physical Exam: Filed Vitals:    07/25/12 1043  BP: 132/98  Pulse: 66  Height: 5' (1.524 m)  Weight: 93 lb 9.6 oz (42.457 kg)  SpO2: 99%    GEN- The patient is thin appearing, alert and oriented x 3 today.   Head- normocephalic, atraumatic Eyes-  Sclera clear, conjunctiva pink Ears- hearing intact Oropharynx- clear Neck- supple, no JVP Lymph- no cervical lymphadenopathy Lungs- Clear to ausculation bilaterally, normal work of breathing Chest- pacemaker pocket is well healed Heart- iregular rate and rhythm, 1/6 diastolic murmur at apex, 2/6 SEM along LSB GI- soft, NT, ND, + BS Extremities- no clubbing, cyanosis, or edema, no homans/ cords MS- no significant deformity or atrophy  Pacemaker interrogation- reviewed in detail today,  See PACEART report  Assessment and Plan:

## 2012-07-25 NOTE — Assessment & Plan Note (Signed)
Repeat echo I have spoken with Dr Excell Seltzer who is willing to follow her valvular heart disease going forward She is referred to Dr Excell Seltzer today.

## 2012-07-25 NOTE — Assessment & Plan Note (Signed)
Normal pacemaker function See Arita Miss Art report No changes today  V rates are better controlled but occasionally elevated,  If she develops symptoms of CHF, we should more aggressively titrate her metoprolol

## 2012-07-25 NOTE — Patient Instructions (Signed)
Your physician wants you to follow-up in: 12 months with Dr Jacquiline Doe will receive a reminder letter in the mail two months in advance. If you don't receive a letter, please call our office to schedule the follow-up appointment.   Remote monitoring is used to monitor your Pacemaker of ICD from home. This monitoring reduces the number of office visits required to check your device to one time per year. It allows [place] to keep an eye on the functioning of your device to ensure it is working properly. You are scheduled for a device check from home on 11/01/11. You may send your transmission at any time that day. If you have a wireless device, the transmission will be sent automatically. After your physician reviews your transmission, you will receive a postcard with your next transmission date.    Your physician has requested that you have an echocardiogram. Echocardiography is a painless test that uses sound waves to create images of your heart. It provides your doctor with information about the size and shape of your heart and how well your heart's chambers and valves are working. This procedure takes approximately one hour. There are no restrictions for this procedure.  Your physician recommends that you return for lab work today  You have been referred to Dr Excell Seltzer for Mitral Stenosis

## 2012-07-26 ENCOUNTER — Other Ambulatory Visit: Payer: Self-pay | Admitting: Nephrology

## 2012-07-26 DIAGNOSIS — N281 Cyst of kidney, acquired: Secondary | ICD-10-CM

## 2012-07-28 ENCOUNTER — Ambulatory Visit
Admission: RE | Admit: 2012-07-28 | Discharge: 2012-07-28 | Disposition: A | Discharge status: Home / Self Care | Payer: Medicare Other | Source: Ambulatory Visit | Attending: Nephrology | Admitting: Nephrology

## 2012-07-28 ENCOUNTER — Ambulatory Visit (HOSPITAL_COMMUNITY): Payer: Medicare Other | Attending: Internal Medicine

## 2012-07-28 ENCOUNTER — Other Ambulatory Visit (HOSPITAL_COMMUNITY): Payer: Medicare Other

## 2012-07-28 ENCOUNTER — Ambulatory Visit (INDEPENDENT_AMBULATORY_CARE_PROVIDER_SITE_OTHER): Payer: Medicare Other | Admitting: *Deleted

## 2012-07-28 DIAGNOSIS — N281 Cyst of kidney, acquired: Secondary | ICD-10-CM

## 2012-07-28 DIAGNOSIS — E119 Type 2 diabetes mellitus without complications: Secondary | ICD-10-CM | POA: Insufficient documentation

## 2012-07-28 DIAGNOSIS — I517 Cardiomegaly: Secondary | ICD-10-CM | POA: Insufficient documentation

## 2012-07-28 DIAGNOSIS — I519 Heart disease, unspecified: Secondary | ICD-10-CM

## 2012-07-28 DIAGNOSIS — I495 Sick sinus syndrome: Secondary | ICD-10-CM

## 2012-07-28 DIAGNOSIS — I359 Nonrheumatic aortic valve disorder, unspecified: Secondary | ICD-10-CM | POA: Insufficient documentation

## 2012-07-28 DIAGNOSIS — I4891 Unspecified atrial fibrillation: Secondary | ICD-10-CM | POA: Insufficient documentation

## 2012-07-28 DIAGNOSIS — I050 Rheumatic mitral stenosis: Secondary | ICD-10-CM

## 2012-07-28 DIAGNOSIS — I079 Rheumatic tricuspid valve disease, unspecified: Secondary | ICD-10-CM | POA: Insufficient documentation

## 2012-07-28 DIAGNOSIS — I059 Rheumatic mitral valve disease, unspecified: Secondary | ICD-10-CM | POA: Insufficient documentation

## 2012-07-28 DIAGNOSIS — I10 Essential (primary) hypertension: Secondary | ICD-10-CM | POA: Insufficient documentation

## 2012-07-28 DIAGNOSIS — Z87891 Personal history of nicotine dependence: Secondary | ICD-10-CM | POA: Insufficient documentation

## 2012-07-28 LAB — BASIC METABOLIC PANEL
Calcium: 10.1 mg/dL (ref 8.4–10.5)
GFR: 57.6 mL/min — ABNORMAL LOW (ref >60.00)
Sodium: 140 mEq/L (ref 135–145)

## 2012-07-28 LAB — CBC WITH DIFFERENTIAL/PLATELET
Basophils Absolute: 0 10*3/uL (ref 0.0–0.1)
Eosinophils Absolute: 0.1 10*3/uL (ref 0.0–0.7)
HCT: 43.2 % (ref 36.0–46.0)
Lymphs Abs: 1.4 10*3/uL (ref 0.7–4.0)
MCV: 99.1 fl (ref 78.0–100.0)
Monocytes Absolute: 0.6 10*3/uL (ref 0.1–1.0)
Monocytes Relative: 8.2 % (ref 3.0–12.0)
Platelets: 215 10*3/uL (ref 150.0–400.0)
RDW: 15.5 % — ABNORMAL HIGH (ref 11.5–14.6)

## 2012-07-28 NOTE — Progress Notes (Signed)
Echocardiogram performed.  

## 2012-07-29 LAB — DIGOXIN LEVEL: Digoxin Level: 0.9 ng/mL (ref 0.8–2.0)

## 2012-08-18 ENCOUNTER — Other Ambulatory Visit: Payer: Self-pay | Admitting: *Deleted

## 2012-08-18 MED ORDER — WARFARIN SODIUM 2.5 MG PO TABS: ORAL_TABLET | ORAL | Status: DC

## 2012-08-23 ENCOUNTER — Ambulatory Visit (INDEPENDENT_AMBULATORY_CARE_PROVIDER_SITE_OTHER): Payer: Medicare Other | Admitting: *Deleted

## 2012-08-23 ENCOUNTER — Encounter: Payer: Self-pay | Admitting: Cardiovascular Disease

## 2012-08-23 ENCOUNTER — Ambulatory Visit (INDEPENDENT_AMBULATORY_CARE_PROVIDER_SITE_OTHER): Payer: Medicare Other | Admitting: Cardiovascular Disease

## 2012-08-23 VITALS — BP 128/64 | HR 81 | Ht 60.0 in | Wt 95.0 lb

## 2012-08-23 DIAGNOSIS — M79609 Pain in unspecified limb: Secondary | ICD-10-CM

## 2012-08-23 DIAGNOSIS — M79606 Pain in leg, unspecified: Secondary | ICD-10-CM

## 2012-08-23 DIAGNOSIS — Z7901 Long term (current) use of anticoagulants: Secondary | ICD-10-CM

## 2012-08-23 DIAGNOSIS — I4891 Unspecified atrial fibrillation: Secondary | ICD-10-CM

## 2012-08-23 NOTE — Patient Instructions (Addendum)
Your physician wants you to follow-up in: 6 months with [name] Fredrickson, NP You will receive a reminder letter in the mail two months in advance. If you don't receive a letter, please call our office to schedule the follow-up appointment.  Your physician has requested that you have an ankle brachial index (ABI). During this test an ultrasound and blood pressure cuff are used to evaluate the arteries that supply the arms and legs with blood. Allow thirty minutes for this exam. There are no restrictions or special instructions.

## 2012-08-23 NOTE — Progress Notes (Signed)
HPI:  66 year-old presenting for cardiac follow-up. She has previously been followed by Dr Deborah Chalk, and now sees Dr Johney Frame for EP issues. He asked me to assume her general cardiac follow-up considering her valvular heart disease. She has a longstanding history of rheumatic heart disease with moderate-severe mitral stenosis and mild aortic stenosis. She has permanent atrial fibrillation with a history of left MCA stroke, maintained on chronic anticoagulation. She also has a history of renal transplantation and is followed by Dr Hyman Hopes with nephrology.  From a symptomatic perspective, she actually does quite well. She walks 30-40 minutes/day. She tires easily with activities such as pushing a vacuum cleaner. She denies chest pain, palpitations, edema, or syncope. She is compliant with medications.   She denies dyspnea, even with exertion. She denies orthopnea or PND. She does complains of bilateral calf pain with ambulation, both legs are equally affected.  Outpatient Encounter Prescriptions as of 08/23/2012  Medication Sig Dispense Refill  . acetaminophen (TYLENOL) 500 MG tablet Take 500 mg by mouth every 6 (six) hours as needed. For pain      . amoxicillin (AMOXIL) 500 MG capsule Prior tablet before dental procedure      . b complex-vitamin c-folic acid (NEPHRO-VITE) 0.8 MG TABS Take 0.8 mg by mouth daily.        . bifidobacterium infantis (ALIGN) capsule Take 1 capsule by mouth daily.  21 capsule  0  . Calcium Carbonate-Vitamin D (CALTRATE 600+D) 600-400 MG-UNIT per tablet Take 1 tablet by mouth daily.      . cycloSPORINE modified (NEORAL/GENGRAF) 25 MG capsule Take 25 mg by mouth 2 (two) times daily.        [name] Dextromethorphan-Guaifenesin (CORICIDIN HBP CONGESTION/COUGH) 10-200 MG CAPS Take 1 tablet by mouth every 6 (six) hours as needed. For cold or cough      . digoxin (LANOXIN) 0.125 MG tablet Take 0.125 mg by mouth daily.      [name] docusate sodium (COLACE) 100 MG capsule Take 1 capsule (100 mg  total) by mouth daily as needed for constipation.  30 capsule  1  . ergocalciferol (VITAMIN D2) 50000 UNITS capsule Take 50,000 Units by mouth every 30 (thirty) days.        . furosemide (LASIX) 40 MG tablet       . insulin NPH-insulin regular (NOVOLIN 70/30) (70-30) 100 UNIT/ML injection Inject 10 Units into the skin daily with breakfast.       . Iron 66 MG TABS Take 1 tablet by mouth daily.      . metoprolol (LOPRESSOR) 50 MG tablet Take 1 tablet (50 mg total) by mouth 2 (two) times daily.  60 tablet  11  . mycophenolate (CELLCEPT) 250 MG capsule Take 250 mg by mouth 2 (two) times daily.        . ondansetron (ZOFRAN) 8 MG tablet Take by mouth every 8 (eight) hours as needed.      . predniSONE (DELTASONE) 5 MG tablet Take 5 mg by mouth daily.        . sitaGLIPtin (JANUVIA) 100 MG tablet Take 100 mg by mouth daily.        . valACYclovir (VALTREX) 500 MG tablet Take 500 mg by mouth daily. Monday, Wednesday and Friday       . warfarin (COUMADIN) 2.5 MG tablet Take as directed by coumadin clinic  30 tablet  3    Bactrim; Codeine; and Penicillins  Past Medical History  Diagnosis Date  . Diabetes mellitus   .  Hypertension   . Chronic kidney disease     s/p renal transplant;  follows with [name]  . Stroke December 2011    left MCA distribution  . Permanent atrial fibrillation     coumadin clinic at [facility]  . Mitral stenosis     echo 12/10: EF 60-65%, mild LVH, mild LAE, mild AS with AVA 1.47 and mean 9, severe MAC, severe MS with mean 7, mod TR, PASP 40  . Aortic stenosis   . Symptomatic bradycardia     s/p pacer  . Depression   . HSV (herpes simplex virus) infection   . Anemia   . PAD (peripheral artery disease)     s/p prior RLE angioplasty with [name]  . Hyperparathyroidism, primary     s/p parathyroidectomy    Past Surgical History  Procedure Date  . Insert / replace / remove pacemaker   . Kidney transplant December 1999    History   Social  History  . Marital Status: Legally Separated    Spouse Name: N/A    Number of Children: N/A  . Years of Education: N/A   Occupational History  . Not on file.   Social History Main Topics  . Smoking status: Former Games developer  . Smokeless tobacco: Never Used  . Alcohol Use: No  . Drug Use: No  . Sexually Active: No   Other Topics Concern  . Not on file   Social History Narrative  . No narrative on file    Family History  Problem Relation Age of Onset  . Kidney disease Brother     died from  . Kidney disease Sister     died from    ROS:  General: no fevers/chills/night sweats Eyes: no blurry vision, diplopia, or amaurosis ENT: no sore throat or hearing loss Resp: no cough, wheezing, or hemoptysis CV: no edema or palpitations GI: no abdominal pain, nausea, vomiting, diarrhea, or constipation GU: no dysuria, frequency, or hematuria Skin: no rash Neuro: no headache, numbness, tingling, or weakness of extremities Musculoskeletal: no joint pain or swelling Heme: no bleeding, DVT, or easy bruising Endo: no polydipsia or polyuria  BP 128/64  Pulse 81  Ht 5' (1.524 m)  Wt 43.092 kg (95 lb)  BMI 18.55 kg/m2  PHYSICAL EXAM: Pt is alert and oriented, pleasant, very thin woman in NAD. HEENT: normal Neck: JVP normal and easily visible. Carotid upstrokes normal without bruits. No thyromegaly. Lungs: equal expansion, clear bilaterally CV: Apex is discrete and nondisplaced, irregular irregular with 2/6 systolic murmur at LSB and blowing diastolic rumble Abd: soft, thin, NT, +BS, no bruit, no hepatosplenomegaly Back: no CVA tenderness Ext: no C/C/E        Femoral pulses 2+=         DP/PT pulses diminished bilaterally Skin: warm and dry without rash Neuro: CNII-XII intact             Strength intact = bilaterally  EKG:  AFib 81 bpm, left axis, demand ventricular pacing, ST-T changes consider inferolateral ischemia.  Echo: Study Conclusions  - Left ventricle: The cavity  size was normal. Wall thickness was increased in a pattern of mild LVH. Systolic function was normal. The estimated ejection fraction was in the range of 55% to 60%. - Aortic valve: There was mild stenosis. - Mitral valve: The findings are consistent with severe stenosis. Mild regurgitation. - Left atrium: The atrium was severely dilated. - Right atrium: The atrium was moderately dilated. - Atrial  septum: No defect or patent foramen ovale was identified. - Tricuspid valve: Moderate regurgitation. - Pulmonary arteries: PA peak pressure: 54mm Hg (S).  ASSESSMENT AND PLAN: 1. Mitral Stenosis, severe. Symptoms are minimal and the patient maintains a good functional capacity. Recommend continued observation and follow-up in 6 months. Will need to get records from Dr Deborah Chalk who followed her for several years. If her symptoms progress, we will need to consider surgery very carefully in the setting of her low body weight and chronic immunosuppressive Rx following renal transplant.   2. Permanent AFib. Maintained on long-term anticoagulation. Heart rate well-controlled. Pt is s/p permanent pacemaker.  3. Intermittent claudication. Obtain ABI's.  [name] 09/04/2012

## 2012-08-30 ENCOUNTER — Encounter: Payer: Self-pay | Admitting: Cardiovascular Disease

## 2012-09-20 ENCOUNTER — Ambulatory Visit (INDEPENDENT_AMBULATORY_CARE_PROVIDER_SITE_OTHER): Payer: Medicaid Other | Admitting: Cardiology

## 2012-09-20 ENCOUNTER — Encounter: Payer: Medicare Other | Admitting: Cardiology

## 2012-09-20 ENCOUNTER — Ambulatory Visit (INDEPENDENT_AMBULATORY_CARE_PROVIDER_SITE_OTHER): Payer: Medicare Other | Admitting: *Deleted

## 2012-09-20 DIAGNOSIS — I739 Peripheral vascular disease, unspecified: Secondary | ICD-10-CM

## 2012-09-20 DIAGNOSIS — Z7901 Long term (current) use of anticoagulants: Secondary | ICD-10-CM

## 2012-09-20 DIAGNOSIS — M79606 Pain in leg, unspecified: Secondary | ICD-10-CM

## 2012-09-20 DIAGNOSIS — I70219 Atherosclerosis of native arteries of extremities with intermittent claudication, unspecified extremity: Secondary | ICD-10-CM

## 2012-09-20 DIAGNOSIS — I4891 Unspecified atrial fibrillation: Secondary | ICD-10-CM

## 2012-09-20 LAB — POCT INR: INR: 3.5

## 2012-10-03 ENCOUNTER — Telehealth: Payer: Self-pay | Admitting: *Deleted

## 2012-10-03 NOTE — Telephone Encounter (Signed)
Pt called to inform us that she is starting Keflex 500mg s TID for 10 days. Informed her to call if ABX is changed.

## 2012-10-07 ENCOUNTER — Telehealth: Payer: Self-pay | Admitting: *Deleted

## 2012-10-07 NOTE — Telephone Encounter (Signed)
Patient has home health ([facility]) in for wound care to foot, gave orders for INR 10/11/2012 using micro coag machine.

## 2012-10-11 ENCOUNTER — Ambulatory Visit: Payer: Self-pay | Admitting: Cardiology

## 2012-10-11 DIAGNOSIS — I4891 Unspecified atrial fibrillation: Secondary | ICD-10-CM

## 2012-10-11 LAB — POCT INR: INR: 4

## 2012-10-25 ENCOUNTER — Ambulatory Visit (INDEPENDENT_AMBULATORY_CARE_PROVIDER_SITE_OTHER): Payer: Medicare Other | Admitting: General Practice

## 2012-10-25 DIAGNOSIS — I4891 Unspecified atrial fibrillation: Secondary | ICD-10-CM

## 2012-10-31 ENCOUNTER — Encounter: Payer: Medicare Other | Admitting: *Deleted

## 2012-11-10 ENCOUNTER — Encounter: Payer: Self-pay | Admitting: *Deleted

## 2012-11-15 ENCOUNTER — Telehealth: Payer: Self-pay | Admitting: Cardiovascular Disease

## 2012-11-15 NOTE — Telephone Encounter (Signed)
I spoke with the pt and made her aware of results.  The pt has been released from home health and can now come into the office for an appointment. The pt is scheduled to see the coumadin clinic tomorrow but Dr Excell Seltzer is not in the office. I will reschedule the pt's appointment to 11/17/12 with Dr Excell Seltzer and the coumadin clinic.

## 2012-11-15 NOTE — Telephone Encounter (Signed)
New problem:  Test results.  

## 2012-11-17 ENCOUNTER — Encounter: Payer: Self-pay | Admitting: *Deleted

## 2012-11-17 ENCOUNTER — Ambulatory Visit (INDEPENDENT_AMBULATORY_CARE_PROVIDER_SITE_OTHER): Payer: Medicare Other | Admitting: Cardiovascular Disease

## 2012-11-17 ENCOUNTER — Ambulatory Visit (INDEPENDENT_AMBULATORY_CARE_PROVIDER_SITE_OTHER): Payer: Medicare Other | Admitting: *Deleted

## 2012-11-17 ENCOUNTER — Encounter: Payer: Self-pay | Admitting: Cardiovascular Disease

## 2012-11-17 VITALS — BP 122/64 | Ht 60.0 in | Wt 94.8 lb

## 2012-11-17 DIAGNOSIS — I4891 Unspecified atrial fibrillation: Secondary | ICD-10-CM

## 2012-11-17 LAB — BASIC METABOLIC PANEL
CO2: 24 mEq/L (ref 19–32)
Calcium: 9.7 mg/dL (ref 8.4–10.5)
Creatinine, Ser: 1.2 mg/dL (ref 0.4–1.2)
GFR: 59.82 mL/min — ABNORMAL LOW (ref >60.00)
Glucose, Bld: 92 mg/dL (ref 70–99)

## 2012-11-17 LAB — CBC WITH DIFFERENTIAL/PLATELET
Basophils Absolute: 0 10*3/uL (ref 0.0–0.1)
Basophils Relative: 0.3 % (ref 0.0–3.0)
Eosinophils Absolute: 0.1 10*3/uL (ref 0.0–0.7)
Lymphocytes Relative: 15.1 % (ref 12.0–46.0)
MCHC: 33.3 g/dL (ref 30.0–36.0)
Neutrophils Relative %: 73.5 % (ref 43.0–77.0)
Platelets: 213 10*3/uL (ref 150.0–400.0)
RBC: 4.3 Mil/uL (ref 3.87–5.11)
RDW: 16.1 % — ABNORMAL HIGH (ref 11.5–14.6)

## 2012-11-17 LAB — POCT INR: INR: 2.3

## 2012-11-17 MED ORDER — ENOXAPARIN SODIUM 40 MG/0.4ML ~~LOC~~ SOLN: 40.0000 mg | Freq: Two times a day (BID) | SUBCUTANEOUS | Status: DC

## 2012-11-17 NOTE — Patient Instructions (Addendum)
11/24/2012 last day to take coumadin 11/25/2012 no coumadin nor lovenox 11/26/2012 no coumadin  Lovenox 40 mg am and lovenox 40 mg PM 11/27/2012 no coumadin Lovenox 40 ng am and lovenox 40 mg PM 11/28/2012 no coumadin lovenox 40 mg am amd lovenox 40 mg PM 11/29/2012 no coumadin  Lovenox 40 mg am only 11/30/2012 day of procedure no coumadin nor lovenox then when instructed by MD doing procedure restart lovenox and coumadin at same dose except for coumadin take extra 1/2 tablet for 2 days  Continue lovenox and coumadin until returns for INR check on 12/05/2012

## 2012-11-17 NOTE — Progress Notes (Signed)
HPI:  This is a delightful 67 year old woman returning for followup evaluation. She is followed for permanent atrial fibrillation, mitral stenosis, and lower extremity peripheral arterial disease. She's developed progressive symptoms of left calf claudication, now occurring at short distances. She's also developed ulceration and pain in her left foot. She's had a heel ulcer and has been working with podiatry. This is improving. She's also developed painful toes with ulcerations. Her symptoms have been progressing over the past year. She was previously scheduled for angiography at [facility] back to his hospital but she decided not to proceed with this. She is now returning for further discussion because she is more symptomatic.  She denies right leg pain. She denies thigh or buttock pain with ambulation. She denies chest pain, dyspnea, or palpitations.  Outpatient Encounter Prescriptions as of 11/17/2012  Medication Sig Dispense Refill  . acetaminophen (TYLENOL) 500 MG tablet Take 500 mg by mouth every 6 (six) hours as needed. For pain      . amoxicillin (AMOXIL) 500 MG capsule Prior tablet before dental procedure      . b complex-vitamin c-folic acid (NEPHRO-VITE) 0.8 MG TABS Take 0.8 mg by mouth daily.        . bifidobacterium infantis (ALIGN) capsule Take 1 capsule by mouth daily.  21 capsule  0  . Calcium Carbonate-Vitamin D (CALTRATE 600+D) 600-400 MG-UNIT per tablet Take 1 tablet by mouth daily.      . cycloSPORINE modified (NEORAL/GENGRAF) 25 MG capsule Take 25 mg by mouth 2 (two) times daily.        [name] Dextromethorphan-Guaifenesin (CORICIDIN HBP CONGESTION/COUGH) 10-200 MG CAPS Take 1 tablet by mouth every 6 (six) hours as needed. For cold or cough      . digoxin (LANOXIN) 0.125 MG tablet Take 0.125 mg by mouth daily.      [name] docusate sodium (COLACE) 100 MG capsule Take 1 capsule (100 mg total) by mouth daily as needed for constipation.  30 capsule  1  . ergocalciferol (VITAMIN D2) 50000  UNITS capsule Take 50,000 Units by mouth every 30 (thirty) days.        . furosemide (LASIX) 40 MG tablet Take 40 mg by mouth as needed.       . insulin NPH-insulin regular (NOVOLIN 70/30) (70-30) 100 UNIT/ML injection Inject 10 Units into the skin daily with breakfast.       . Iron 66 MG TABS Take 1 tablet by mouth daily.      . metoprolol (LOPRESSOR) 50 MG tablet Take 1 tablet (50 mg total) by mouth 2 (two) times daily.  60 tablet  11  . mupirocin ointment (BACTROBAN) 2 % Apply topically daily.      . mycophenolate (CELLCEPT) 250 MG capsule Take 250 mg by mouth 2 (two) times daily.        . ondansetron (ZOFRAN) 8 MG tablet Take by mouth every 8 (eight) hours as needed.      . predniSONE (DELTASONE) 5 MG tablet Take 5 mg by mouth daily.        . sitaGLIPtin (JANUVIA) 100 MG tablet Take 100 mg by mouth daily.        . valACYclovir (VALTREX) 500 MG tablet Take 500 mg by mouth daily. Monday, Wednesday and Friday       . warfarin (COUMADIN) 2.5 MG tablet Take as directed by coumadin clinic  30 tablet  3   No facility-administered encounter medications on file as of 11/17/2012.    Allergies  Allergen  Reactions  . Bactrim (Sulfamethoxazole W-Trimethoprim)   . Codeine Hives and Nausea Only  . Penicillins Nausea And Vomiting    Past Medical History  Diagnosis Date  . Diabetes mellitus   . Hypertension   . Chronic kidney disease     s/p renal transplant;  follows with [name]  . Stroke December 2011    left MCA distribution  . Permanent atrial fibrillation     coumadin clinic at [facility]  . Mitral stenosis     echo 12/10: EF 60-65%, mild LVH, mild LAE, mild AS with AVA 1.47 and mean 9, severe MAC, severe MS with mean 7, mod TR, PASP 40  . Aortic stenosis   . Symptomatic bradycardia     s/p pacer  . Depression   . HSV (herpes simplex virus) infection   . Anemia   . PAD (peripheral artery disease)     s/p prior RLE angioplasty with [name]  .  Hyperparathyroidism, primary     s/p parathyroidectomy    ROS: Negative except as per HPI  BP 122/64  Ht 5' (1.524 m)  Wt 43.001 kg (94 lb 12.8 oz)  BMI 18.51 kg/m2  PHYSICAL EXAM: Pt is alert and oriented, NAD HEENT: normal Neck: JVP - normal, carotids 2+= without bruits Lungs: CTA bilaterally CV: Irregular with grade 2/6 holosystolic murmur at the apex Abd: soft, NT, Positive BS, no hepatomegaly Ext: There is no edema. Femoral pulses are 2+ bilaterally. Pedal pulses are nonpalpable bilaterally. The left foot shows a healing ulcer on the heel. There are dark punctate lesions on the plantar aspect of the left third and fourth toes. The toes are tender to palpation.  ASSESSMENT AND PLAN: 1. Lower extremity peripheral arterial disease with rest pain and intermittent claudication. The patient's symptoms are primarily in the left leg. Her right leg does not bother her. Her noninvasive studies suggest both inflow disease and diffuse infrainguinal disease. She has monophasic tibial flow. I have recommended angiography and possible PTA and stenting considering her progressive symptoms in the left leg with ulceration. I have reviewed the risks, indications, and alternatives to this approach. She agrees to proceed and understands these risks. I will schedule the procedure after her Coumadin is held for 5 days. She will require Lovenox bridging because of her mitral stenosis and atrial fibrillation with history of TIA. Will check renal function today since she has a history of renal transplant.  2. Mitral stenosis. The patient remains minimally symptomatic. Will continue clinical followup.  3. Permanent atrial fibrillation. She remains well rate controlled. She is anticoagulated with warfarin. As above, we'll bridge her with Lovenox for her upcoming procedure.  [name] 11/17/2012 1:51 PM

## 2012-11-17 NOTE — Patient Instructions (Addendum)
Your physician has requested that you have a peripheral vascular angiogram. This exam is performed at the hospital. During this exam IV contrast is used to look at arterial blood flow. Please review the information sheet given for details. Scheduled for November 30, 2012 with [name].  The coumadin clinic will give you instructions regarding Coumadin and Lovenox.

## 2012-11-24 ENCOUNTER — Encounter (HOSPITAL_COMMUNITY): Payer: Self-pay

## 2012-11-30 ENCOUNTER — Encounter (HOSPITAL_COMMUNITY): Payer: Self-pay | Admitting: General Practice

## 2012-11-30 ENCOUNTER — Ambulatory Visit (HOSPITAL_COMMUNITY)
Admission: RE | Admit: 2012-11-30 | Discharge: 2012-12-01 | Disposition: A | Discharge status: Home / Self Care | Payer: Medicare Other | Source: Ambulatory Visit | Attending: Cardiovascular Disease | Admitting: Cardiovascular Disease

## 2012-11-30 ENCOUNTER — Encounter (HOSPITAL_COMMUNITY)
Admission: RE | Disposition: A | Discharge status: Home / Self Care | Payer: Self-pay | Source: Ambulatory Visit | Attending: Cardiovascular Disease

## 2012-11-30 DIAGNOSIS — I080 Rheumatic disorders of both mitral and aortic valves: Secondary | ICD-10-CM | POA: Insufficient documentation

## 2012-11-30 DIAGNOSIS — L98499 Non-pressure chronic ulcer of skin of other sites with unspecified severity: Secondary | ICD-10-CM | POA: Insufficient documentation

## 2012-11-30 DIAGNOSIS — I129 Hypertensive chronic kidney disease with stage 1 through stage 4 chronic kidney disease, or unspecified chronic kidney disease: Secondary | ICD-10-CM | POA: Insufficient documentation

## 2012-11-30 DIAGNOSIS — I4891 Unspecified atrial fibrillation: Secondary | ICD-10-CM | POA: Insufficient documentation

## 2012-11-30 DIAGNOSIS — I739 Peripheral vascular disease, unspecified: Secondary | ICD-10-CM | POA: Insufficient documentation

## 2012-11-30 DIAGNOSIS — L97409 Non-pressure chronic ulcer of unspecified heel and midfoot with unspecified severity: Secondary | ICD-10-CM | POA: Insufficient documentation

## 2012-11-30 DIAGNOSIS — N189 Chronic kidney disease, unspecified: Secondary | ICD-10-CM | POA: Insufficient documentation

## 2012-11-30 DIAGNOSIS — I708 Atherosclerosis of other arteries: Secondary | ICD-10-CM | POA: Insufficient documentation

## 2012-11-30 DIAGNOSIS — Z7901 Long term (current) use of anticoagulants: Secondary | ICD-10-CM | POA: Insufficient documentation

## 2012-11-30 DIAGNOSIS — E119 Type 2 diabetes mellitus without complications: Secondary | ICD-10-CM | POA: Insufficient documentation

## 2012-11-30 DIAGNOSIS — Z940 Kidney transplant status: Secondary | ICD-10-CM | POA: Insufficient documentation

## 2012-11-30 HISTORY — PX: ABDOMINAL AORTAGRAM: SHX5454

## 2012-11-30 HISTORY — DX: Rheumatic mitral stenosis: I05.0

## 2012-11-30 HISTORY — PX: ABDOMINAL ANGIOGRAM: SHX5705

## 2012-11-30 LAB — GLUCOSE, CAPILLARY
Glucose-Capillary: 102 mg/dL — ABNORMAL HIGH (ref 70–99)
Glucose-Capillary: 161 mg/dL — ABNORMAL HIGH (ref 70–99)
Glucose-Capillary: 179 mg/dL — ABNORMAL HIGH (ref 70–99)

## 2012-11-30 LAB — PROTIME-INR
INR: 1.09 (ref 0.00–1.49)
Prothrombin Time: 14 seconds (ref 11.6–15.2)

## 2012-11-30 LAB — POCT ACTIVATED CLOTTING TIME
Activated Clotting Time: 290 seconds
Activated Clotting Time: 268 seconds
Activated Clotting Time: 154 seconds

## 2012-11-30 SURGERY — ABDOMINAL AORTAGRAM
Anesthesia: LOCAL

## 2012-11-30 MED ORDER — CLOPIDOGREL BISULFATE 300 MG PO TABS
ORAL_TABLET | ORAL | Status: AC
  Filled 2012-11-30: qty 1

## 2012-11-30 MED ORDER — SODIUM CHLORIDE 0.9 % IJ SOLN: 3.0000 mL | Freq: Two times a day (BID) | INTRAMUSCULAR | Status: DC

## 2012-11-30 MED ORDER — ENOXAPARIN SODIUM 40 MG/0.4ML ~~LOC~~ SOLN
40.0000 mg | Freq: Two times a day (BID) | SUBCUTANEOUS | Status: DC
  Administered 2012-12-01: 40 mg via SUBCUTANEOUS
  Filled 2012-11-30: qty 0.4

## 2012-11-30 MED ORDER — WARFARIN - PHYSICIAN DOSING INPATIENT
Freq: Every day | Status: DC
  Administered 2012-11-30: 19:00:00 1

## 2012-11-30 MED ORDER — DEXTROMETHORPHAN-GUAIFENESIN 10-200 MG PO CAPS: 1.0000 | ORAL_CAPSULE | Freq: Four times a day (QID) | ORAL | Status: DC | PRN

## 2012-11-30 MED ORDER — POLYVINYL ALCOHOL 1.4 % OP SOLN
1.0000 [drp] | Freq: Four times a day (QID) | OPHTHALMIC | Status: DC | PRN
  Filled 2012-11-30: qty 15

## 2012-11-30 MED ORDER — ERGOCALCIFEROL 1.25 MG (50000 UT) PO CAPS: 50000.0000 [IU] | ORAL_CAPSULE | ORAL | Status: DC

## 2012-11-30 MED ORDER — DOCUSATE SODIUM 100 MG PO CAPS
100.0000 mg | ORAL_CAPSULE | Freq: Every day | ORAL | Status: DC | PRN
  Filled 2012-11-30: qty 1

## 2012-11-30 MED ORDER — PREDNISONE 5 MG PO TABS
5.0000 mg | ORAL_TABLET | Freq: Every day | ORAL | Status: DC
  Administered 2012-12-01: 5 mg via ORAL
  Filled 2012-11-30: qty 1

## 2012-11-30 MED ORDER — DIGOXIN 125 MCG PO TABS
0.1250 mg | ORAL_TABLET | Freq: Every day | ORAL | Status: DC
  Administered 2012-12-01: 0.125 mg via ORAL
  Filled 2012-11-30: qty 1

## 2012-11-30 MED ORDER — FUROSEMIDE 40 MG PO TABS
40.0000 mg | ORAL_TABLET | Freq: Every day | ORAL | Status: DC | PRN
  Filled 2012-11-30: qty 1

## 2012-11-30 MED ORDER — HYDRALAZINE HCL 20 MG/ML IJ SOLN
10.0000 mg | INTRAMUSCULAR | Status: DC | PRN
  Filled 2012-11-30: qty 0.5

## 2012-11-30 MED ORDER — NITROGLYCERIN IN D5W 200-5 MCG/ML-% IV SOLN
INTRAVENOUS | Status: AC
  Filled 2012-11-30: qty 250

## 2012-11-30 MED ORDER — HEPARIN (PORCINE) IN NACL 2-0.9 UNIT/ML-% IJ SOLN
INTRAMUSCULAR | Status: AC
  Filled 2012-11-30: qty 1000

## 2012-11-30 MED ORDER — METOPROLOL TARTRATE 50 MG PO TABS
50.0000 mg | ORAL_TABLET | Freq: Two times a day (BID) | ORAL | Status: DC
  Administered 2012-11-30 – 2012-12-01 (×2): 50 mg via ORAL
  Filled 2012-11-30 (×3): qty 1

## 2012-11-30 MED ORDER — LIDOCAINE HCL (PF) 1 % IJ SOLN
INTRAMUSCULAR | Status: AC
  Filled 2012-11-30: qty 30

## 2012-11-30 MED ORDER — INSULIN NPH ISOPHANE & REGULAR (70-30) 100 UNIT/ML ~~LOC~~ SUSP: 10.0000 [IU] | Freq: Every day | SUBCUTANEOUS | Status: DC

## 2012-11-30 MED ORDER — MYCOPHENOLATE MOFETIL 250 MG PO CAPS
500.0000 mg | ORAL_CAPSULE | Freq: Two times a day (BID) | ORAL | Status: DC
  Administered 2012-11-30 – 2012-12-01 (×2): 500 mg via ORAL
  Filled 2012-11-30 (×2): qty 2

## 2012-11-30 MED ORDER — ALIGN PO CAPS: 1.0000 | ORAL_CAPSULE | Freq: Every day | ORAL | Status: DC

## 2012-11-30 MED ORDER — MUPIROCIN 2 % EX OINT
1.0000 "application " | TOPICAL_OINTMENT | Freq: Every day | CUTANEOUS | Status: DC
  Administered 2012-11-30: 1 via TOPICAL

## 2012-11-30 MED ORDER — HYPROMELLOSE (GONIOSCOPIC) 2.5 % OP SOLN: 1.0000 [drp] | Freq: Four times a day (QID) | OPHTHALMIC | Status: DC | PRN

## 2012-11-30 MED ORDER — WARFARIN SODIUM 2.5 MG PO TABS
2.5000 mg | ORAL_TABLET | ORAL | Status: DC
  Administered 2012-11-30: 2.5 mg via ORAL
  Filled 2012-11-30: qty 1

## 2012-11-30 MED ORDER — WARFARIN 1.25 MG HALF TABLET
1.2500 mg | ORAL_TABLET | ORAL | Status: DC
  Filled 2012-11-30: qty 1

## 2012-11-30 MED ORDER — SODIUM CHLORIDE 0.9 % IV SOLN: 250.0000 mL | INTRAVENOUS | Status: DC | PRN

## 2012-11-30 MED ORDER — IRON 66 MG PO TABS: 1.0000 | ORAL_TABLET | Freq: Every day | ORAL | Status: DC

## 2012-11-30 MED ORDER — INSULIN ASPART PROT & ASPART (70-30 MIX) 100 UNIT/ML ~~LOC~~ SUSP
10.0000 [IU] | Freq: Every day | SUBCUTANEOUS | Status: DC
  Administered 2012-12-01: 09:00:00 10 [IU] via SUBCUTANEOUS
  Filled 2012-11-30: qty 10

## 2012-11-30 MED ORDER — RENA-VITE PO TABS
1.0000 | ORAL_TABLET | Freq: Every day | ORAL | Status: DC
  Administered 2012-11-30: 1 via ORAL
  Filled 2012-11-30 (×2): qty 1

## 2012-11-30 MED ORDER — SODIUM CHLORIDE 0.9 % IV SOLN
INTRAVENOUS | Status: DC
  Administered 2012-11-30: 07:00:00 via INTRAVENOUS

## 2012-11-30 MED ORDER — POLYSACCHARIDE IRON COMPLEX 150 MG PO CAPS
150.0000 mg | ORAL_CAPSULE | Freq: Every day | ORAL | Status: DC
  Administered 2012-11-30 – 2012-12-01 (×2): 150 mg via ORAL
  Filled 2012-11-30 (×2): qty 1

## 2012-11-30 MED ORDER — CALCIUM CARBONATE-VITAMIN D 500-200 MG-UNIT PO TABS
1.0000 | ORAL_TABLET | Freq: Every day | ORAL | Status: DC
  Administered 2012-11-30 – 2012-12-01 (×2): 1 via ORAL
  Filled 2012-11-30 (×3): qty 1

## 2012-11-30 MED ORDER — CYCLOSPORINE MODIFIED (NEORAL) 25 MG PO CAPS
25.0000 mg | ORAL_CAPSULE | Freq: Two times a day (BID) | ORAL | Status: DC
  Administered 2012-11-30 – 2012-12-01 (×2): 25 mg via ORAL
  Filled 2012-11-30 (×2): qty 1

## 2012-11-30 MED ORDER — ACETAMINOPHEN 500 MG PO TABS
500.0000 mg | ORAL_TABLET | Freq: Four times a day (QID) | ORAL | Status: DC | PRN
  Administered 2012-11-30 (×2): 500 mg via ORAL
  Filled 2012-11-30 (×2): qty 1

## 2012-11-30 MED ORDER — SACCHAROMYCES BOULARDII 250 MG PO CAPS
250.0000 mg | ORAL_CAPSULE | Freq: Every day | ORAL | Status: DC
  Administered 2012-11-30: 250 mg via ORAL
  Filled 2012-11-30 (×2): qty 1

## 2012-11-30 MED ORDER — FENTANYL CITRATE 0.05 MG/ML IJ SOLN
INTRAMUSCULAR | Status: AC
  Filled 2012-11-30: qty 2

## 2012-11-30 MED ORDER — CLOPIDOGREL BISULFATE 75 MG PO TABS
75.0000 mg | ORAL_TABLET | Freq: Every day | ORAL | Status: DC
Start: 2012-12-01 — End: 2012-12-01
  Administered 2012-12-01: 75 mg via ORAL
  Filled 2012-11-30: qty 1

## 2012-11-30 MED ORDER — DM-GUAIFENESIN ER 30-600 MG PO TB12
1.0000 | ORAL_TABLET | Freq: Two times a day (BID) | ORAL | Status: DC | PRN
  Filled 2012-11-30: qty 1

## 2012-11-30 MED ORDER — MIDAZOLAM HCL 2 MG/2ML IJ SOLN
INTRAMUSCULAR | Status: AC
  Filled 2012-11-30: qty 2

## 2012-11-30 MED ORDER — VITAMIN D (ERGOCALCIFEROL) 1.25 MG (50000 UNIT) PO CAPS: 50000.0000 [IU] | ORAL_CAPSULE | ORAL | Status: DC

## 2012-11-30 MED ORDER — VERAPAMIL HCL 2.5 MG/ML IV SOLN
INTRAVENOUS | Status: AC
  Filled 2012-11-30: qty 2

## 2012-11-30 MED ORDER — ONDANSETRON HCL 4 MG/2ML IJ SOLN
4.0000 mg | Freq: Three times a day (TID) | INTRAMUSCULAR | Status: DC | PRN
  Administered 2012-11-30: 4 mg via INTRAVENOUS
  Filled 2012-11-30: qty 2

## 2012-11-30 MED ORDER — VALACYCLOVIR HCL 500 MG PO TABS
500.0000 mg | ORAL_TABLET | ORAL | Status: DC
  Administered 2012-11-30: 500 mg via ORAL
  Filled 2012-11-30: qty 1

## 2012-11-30 MED ORDER — SODIUM CHLORIDE 0.9 % IV SOLN: INTRAVENOUS | Status: AC

## 2012-11-30 MED ORDER — CALCIUM CARBONATE-VITAMIN D 600-400 MG-UNIT PO TABS: 1.0000 | ORAL_TABLET | Freq: Every day | ORAL | Status: DC

## 2012-11-30 MED ORDER — SODIUM CHLORIDE 0.9 % IJ SOLN: 3.0000 mL | INTRAMUSCULAR | Status: DC | PRN

## 2012-11-30 MED ORDER — HYDRALAZINE HCL 20 MG/ML IJ SOLN
INTRAMUSCULAR | Status: AC
  Filled 2012-11-30: qty 1

## 2012-11-30 MED ORDER — DIPHENHYDRAMINE HCL 50 MG/ML IJ SOLN
INTRAMUSCULAR | Status: AC
  Filled 2012-11-30: qty 1

## 2012-11-30 NOTE — CV Procedure (Signed)
PERIPHERAL VASCULAR PROCEDURE  NAME:  [name] [name]   MRN: [id number] DOB:  [birth year]   ADMIT DATE: 11/30/2012  Performing Cardiologist: Lorine Bears Primary Physician: Lynne Leader, MD Primary Cardiologist:  Dr. Excell Seltzer  Procedures Performed:  Abdominal Aortic Angiogram with iliac angiography   Selective left lower extremity arterial angiography  Left popliteal artery orbital atherectomy and balloon angioplasty.  Left SFA orbital atherectomy and balloon angioplasty   Indication(s):   PAD with lower extremity ulceration.   Consent: The procedure with Risks/Benefits/Alternatives and Indications was reviewed with the patient.  All questions were answered.  Medications:    Contrast:  120 mL of Visipaque   Procedural details: The right groin was prepped, draped, and anesthetized with 1% lidocaine. A micropuncture kit was used for access the right common femoral artery and place the sheath. This was exchanged over the versicolor  wire into a 5 [place] sheath.  A 5 Fr Short Pigtail Catheter was advanced of over the wire into the descending Aorta to a level just below the renal arteries. A power injection of 42ml/sec contrast over 1 sec was performed for Abdominal Aortic and iliac Angiography.   The pigtail catheter was a change over the versicor wire for A crossover catheter which was then pulled back the aortic bifurcation and the wire was advanced down the contralateral common iliac artery the wire was then advanced to the contralateral common femoral artery, a straight tip catheter was advanced over the wire to the common femoral artery. Contralateral second-order lower extremity angiography was performed via power injection of 4 ml / sec contrast for a total of 30 ml.  I elected to image only the left lower extremity to conserve on contrast given her known history of kidney transplant and mild chronic kidney disease.  Interventional Procedure:  Angiography showed  severely calcified disease in the left popliteal artery and the SFA. I elected to proceed with angioplasty. The versicolor wire was advanced through the catheter. The catheter and 5 French sheath were removed. A 6 French destination 45 cm sheath was advanced and placed in the proximal left SFA. I then advanced and 014 [place] core wire over a CXI catheter. The lesion in the left popliteal artery was heavily calcified and tortuous and could not be crossed. I used a whisper wire and also but could not cross. I decided to use a long Glidewire over a Navi cross catheter. I was able to cross with the wire but not with the catheter. I decided to leave the catheter in place and advance the Viper wire which crossed the lesion. I then proceeded with atherectomy with a 1.5 diamond back catheter. However, I could not cross the severe stenosis in the mid popliteal artery with this. Angiography year showed no flow below the lesion. I thus removed the atherectomy catheter and advanced a 2 mm balloon which I was able to advance beyond the lesion after multiple inflations. The lesion was dilated with this balloon.e then used the atherectomy catheter again and performed low on medium pressure runs in the popliteal artery and 3 runs in the SFA to treat the severe mid lesion. Angiography showed good flow but significant residual stenosis. I then used a 4 x 100 mm angioscope balloon which was inflated to 12 atmosphere in the popliteal artery x3 inflations and in the mid SFA. Final angiography showed very good results with diffuse 10% residual stenosis. Heavy calcifications obviously were noted throughout the SFA and popliteal artery. There was still one-vessel  runoff with no evidence of distal embolization. The left anterior tibial artery has significant diffuse disease but good flow all the way down to the foot.  Hemodynamics:  Central Aortic Pressure / Mean Aortic Pressure: 140/67  Findings:  Abdominal aorta: Severe  calcifications with mild diffuse disease.  Left renal artery: Not visualized.  Right renal artery: Not visualized.   Right common iliac artery: Calcified with minor irregularities.  Right internal iliac artery: Calcified with mild diffuse disease.  Right external iliac artery: Calcified with minor irregularities. There is a transplanted kidney originating from the right external iliac artery.  Right common femoral artery: Minor irregularities.  Left common iliac artery:  Calcified with minor irregularities.  Left internal iliac artery: Diffuse 40% proximal disease.  Left external iliac artery: Calcified with minor irregularities.  Left common femoral artery: Minor irregularities.  Left profunda femoral artery: Normal in size with diffuse 60-70% disease proximally.  Left superficial femoral artery:  Heavily calcified throughout its course. There is dense calcification proximally but does not seem to be blocking the blood flow. In the midsegment, there is a heavily calcified 90% lesion. The distal vessel has diffuse 50% disease.  Left popliteal artery: Heavily calcified with diffuse 95% disease.  Left tibial peroneal trunk: Calcified with diffuse 30% stenosis.  Left anterior tibial artery: Patent with diffuse 60% disease throughout its course. This vessel goes all the way down to the foot and is the main patent vessel below the knee.  Left peroneal artery: Heavily calcified with diffuse proximal disease. The vessel is occluded in the midsegment.  Left posterior tibial artery: Occluded proximally.  Conclusions: 1. No significant aortoiliac obstructive disease. 2. Severe left SFA stenosis and diffuse severe left popliteal artery disease. 3. One-vessel runoff below the knee on the left side with patent anterior tibial. 4. successful orbital atherectomy and balloon angioplasty of the left popliteal artery. 5. Successful orbital atherectomy and balloon angioplasty of the left  SFA.  Recommendations:  I  recommend Plavix for one month. Resume warfarin tonight. Lovenox can be resumed tomorrow morning if there is no evidence of hematoma. I will admit the patient for overnight hydration given her previous kidney transplant.    Lorine Bears, MD, [facility] 11/30/2012 11:34 AM

## 2012-11-30 NOTE — Interval H&P Note (Signed)
History and Physical Interval Note:  11/30/2012 8:49 AM  [name] [name]  has presented today for surgery, with the diagnosis of PVD  The various methods of treatment have been discussed with the patient and family. After consideration of risks, benefits and other options for treatment, the patient has consented to  Procedure(s): ABDOMINAL AORTAGRAM (N/A) as a surgical intervention .  The patient's history has been reviewed, patient examined, no change in status, stable for surgery.  I have reviewed the patient's chart and labs.  Questions were answered to the patient's satisfaction.     Lorine Bears

## 2012-11-30 NOTE — Progress Notes (Signed)
Utilization Review Completed Baby Gieger J. Ranbir Chew, RN, BSN, NCM [phone]  

## 2012-11-30 NOTE — Progress Notes (Signed)
Site area: right groin  Site Prior to Removal:  Level 0  Pressure Applied For 20 MINUTES    Minutes Beginning at 1450  Manual:   yes  Patient Status During Pull:  AAO X 4  Post Pull Groin Site:  Level 0  Post Pull Instructions Given:  yes  Post Pull Pulses Present:  yes  Dressing Applied:  yes  Comments:  Tolerated procedure well

## 2012-11-30 NOTE — H&P (View-Only) (Signed)
HPI:  This is a delightful 67 year old woman returning for followup evaluation. She is followed for permanent atrial fibrillation, mitral stenosis, and lower extremity peripheral arterial disease. She's developed progressive symptoms of left calf claudication, now occurring at short distances. She's also developed ulceration and pain in her left foot. She's had a heel ulcer and has been working with podiatry. This is improving. She's also developed painful toes with ulcerations. Her symptoms have been progressing over the past year. She was previously scheduled for angiography at [facility] back to his hospital but she decided not to proceed with this. She is now returning for further discussion because she is more symptomatic.  She denies right leg pain. She denies thigh or buttock pain with ambulation. She denies chest pain, dyspnea, or palpitations.  Outpatient Encounter Prescriptions as of 11/17/2012  Medication Sig Dispense Refill  . acetaminophen (TYLENOL) 500 MG tablet Take 500 mg by mouth every 6 (six) hours as needed. For pain      . amoxicillin (AMOXIL) 500 MG capsule Prior tablet before dental procedure      . b complex-vitamin c-folic acid (NEPHRO-VITE) 0.8 MG TABS Take 0.8 mg by mouth daily.        . bifidobacterium infantis (ALIGN) capsule Take 1 capsule by mouth daily.  21 capsule  0  . Calcium Carbonate-Vitamin D (CALTRATE 600+D) 600-400 MG-UNIT per tablet Take 1 tablet by mouth daily.      . cycloSPORINE modified (NEORAL/GENGRAF) 25 MG capsule Take 25 mg by mouth 2 (two) times daily.        [name] Dextromethorphan-Guaifenesin (CORICIDIN HBP CONGESTION/COUGH) 10-200 MG CAPS Take 1 tablet by mouth every 6 (six) hours as needed. For cold or cough      . digoxin (LANOXIN) 0.125 MG tablet Take 0.125 mg by mouth daily.      [name] docusate sodium (COLACE) 100 MG capsule Take 1 capsule (100 mg total) by mouth daily as needed for constipation.  30 capsule  1  . ergocalciferol (VITAMIN D2) 50000  UNITS capsule Take 50,000 Units by mouth every 30 (thirty) days.        . furosemide (LASIX) 40 MG tablet Take 40 mg by mouth as needed.       . insulin NPH-insulin regular (NOVOLIN 70/30) (70-30) 100 UNIT/ML injection Inject 10 Units into the skin daily with breakfast.       . Iron 66 MG TABS Take 1 tablet by mouth daily.      . metoprolol (LOPRESSOR) 50 MG tablet Take 1 tablet (50 mg total) by mouth 2 (two) times daily.  60 tablet  11  . mupirocin ointment (BACTROBAN) 2 % Apply topically daily.      . mycophenolate (CELLCEPT) 250 MG capsule Take 250 mg by mouth 2 (two) times daily.        . ondansetron (ZOFRAN) 8 MG tablet Take by mouth every 8 (eight) hours as needed.      . predniSONE (DELTASONE) 5 MG tablet Take 5 mg by mouth daily.        . sitaGLIPtin (JANUVIA) 100 MG tablet Take 100 mg by mouth daily.        . valACYclovir (VALTREX) 500 MG tablet Take 500 mg by mouth daily. Monday, Wednesday and Friday       . warfarin (COUMADIN) 2.5 MG tablet Take as directed by coumadin clinic  30 tablet  3   No facility-administered encounter medications on file as of 11/17/2012.    Allergies  Allergen  Reactions  . Bactrim (Sulfamethoxazole W-Trimethoprim)   . Codeine Hives and Nausea Only  . Penicillins Nausea And Vomiting    Past Medical History  Diagnosis Date  . Diabetes mellitus   . Hypertension   . Chronic kidney disease     s/p renal transplant;  follows with [name]  . Stroke December 2011    left MCA distribution  . Permanent atrial fibrillation     coumadin clinic at [facility]  . Mitral stenosis     echo 12/10: EF 60-65%, mild LVH, mild LAE, mild AS with AVA 1.47 and mean 9, severe MAC, severe MS with mean 7, mod TR, PASP 40  . Aortic stenosis   . Symptomatic bradycardia     s/p pacer  . Depression   . HSV (herpes simplex virus) infection   . Anemia   . PAD (peripheral artery disease)     s/p prior RLE angioplasty with [name]  .  Hyperparathyroidism, primary     s/p parathyroidectomy    ROS: Negative except as per HPI  BP 122/64  Ht 5' (1.524 m)  Wt 43.001 kg (94 lb 12.8 oz)  BMI 18.51 kg/m2  PHYSICAL EXAM: Pt is alert and oriented, NAD HEENT: normal Neck: JVP - normal, carotids 2+= without bruits Lungs: CTA bilaterally CV: Irregular with grade 2/6 holosystolic murmur at the apex Abd: soft, NT, Positive BS, no hepatomegaly Ext: There is no edema. Femoral pulses are 2+ bilaterally. Pedal pulses are nonpalpable bilaterally. The left foot shows a healing ulcer on the heel. There are dark punctate lesions on the plantar aspect of the left third and fourth toes. The toes are tender to palpation.  ASSESSMENT AND PLAN: 1. Lower extremity peripheral arterial disease with rest pain and intermittent claudication. The patient's symptoms are primarily in the left leg. Her right leg does not bother her. Her noninvasive studies suggest both inflow disease and diffuse infrainguinal disease. She has monophasic tibial flow. I have recommended angiography and possible PTA and stenting considering her progressive symptoms in the left leg with ulceration. I have reviewed the risks, indications, and alternatives to this approach. She agrees to proceed and understands these risks. I will schedule the procedure after her Coumadin is held for 5 days. She will require Lovenox bridging because of her mitral stenosis and atrial fibrillation with history of TIA. Will check renal function today since she has a history of renal transplant.  2. Mitral stenosis. The patient remains minimally symptomatic. Will continue clinical followup.  3. Permanent atrial fibrillation. She remains well rate controlled. She is anticoagulated with warfarin. As above, we'll bridge her with Lovenox for her upcoming procedure.  [name] 11/17/2012 1:51 PM

## 2012-12-01 ENCOUNTER — Encounter (HOSPITAL_COMMUNITY): Payer: Self-pay | Admitting: Physician Assistant

## 2012-12-01 DIAGNOSIS — I739 Peripheral vascular disease, unspecified: Secondary | ICD-10-CM

## 2012-12-01 LAB — GLUCOSE, CAPILLARY: Glucose-Capillary: 68 mg/dL — ABNORMAL LOW (ref 70–99)

## 2012-12-01 LAB — CBC
Platelets: 169 10*3/uL (ref 150–400)
RBC: 3.93 MIL/uL (ref 3.87–5.11)
WBC: 6.7 10*3/uL (ref 4.0–10.5)

## 2012-12-01 LAB — BASIC METABOLIC PANEL
CO2: 25 mEq/L (ref 19–32)
Chloride: 104 mEq/L (ref 96–112)
Sodium: 139 mEq/L (ref 135–145)

## 2012-12-01 LAB — PROTIME-INR
INR: 1.13 (ref 0.00–1.49)
Prothrombin Time: 14.3 seconds (ref 11.6–15.2)

## 2012-12-01 MED ORDER — ENOXAPARIN SODIUM 40 MG/0.4ML ~~LOC~~ SOLN: 40.0000 mg | Freq: Two times a day (BID) | SUBCUTANEOUS | Status: DC

## 2012-12-01 MED ORDER — CLOPIDOGREL BISULFATE 75 MG PO TABS: 75.0000 mg | ORAL_TABLET | Freq: Every day | ORAL | Status: DC

## 2012-12-01 NOTE — Discharge Summary (Signed)
Discharge Summary   Patient ID: [name] [name] MRN: [id number], DOB/AGE: 65-Nov-1947 65 y.o. Admit date: 11/30/2012 D/C date:     12/01/2012  Primary Cardiologist: Allred/Audrey Thull  Primary Discharge Diagnoses:  1. PVD - s/p L popliteral artery orbital atherectomy and balloon angioplasty & L SFA orbital atherectomy and balloon angioplasty 11/30/12 this admission; Plavix recommended for 1 month - history: prior RLE angioplasty 2. Permanent atrial fibrillation with prior history of stroke - discharged on Lovenox->Coumadin bridge 3. CKD s/p renal transplant 4. Valvular heart disease - severe MS and mild AS  Secondary Discharge Diagnoses:  1. H/o CVA  2. DM 3. HTN 4. Symptomatic bradycardia s/p pacemaker 5. Depression 6. HSV infection 7. Anemia 8. Hyperparathyroidism, primary s/p parathyroidectomy   Hospital Course:  [name] [name] is a 67 y/o F with history of PVD, permanent afib, stroke, DM, HTN, severe mitral stenosis who presented to the office recently to Dr. Excell Seltzer with complaints of progressive L leg claudication. She has developed ulceration and pain in her left foot and has been working with podiatry. Her noninvasive studies suggested both inflow disease and diffuse infrainguinal disease. She had monophasic tibial flow. Dr. Excell Seltzer recommended angiography and possible PTA and stenting considering her symptoms. Coumadin was held, and she was bridged with Lovenox. She udnerwent L popliteral artery orbital atherectomy and balloon angioplasty & L SFA orbital atherectomy and balloon angioplasty 11/30/12. She tolerated this procedure well without complication. Plavix x 30 days was recommended at discharge. Coumadin at home dose was restarted and she was hydrated overnight. Lovenox was also restarted. Cr remained stable. She will be followed clinically for her mitral stenosis. Dr. Excell Seltzer has seen and examined her today and feels she is stable for discharge. He would like her to f/u with Dr. Kirke [name]  in several weeks for her post-intervention followup appointment. She will have Coumadin clinic appt on 12/05/12.  Discharge Vitals: Blood pressure 151/55, pulse 37, temperature 97.3 F (36.3 C), temperature source Oral, resp. rate 18, height 5' (1.524 m), weight 93 lb 4.1 oz (42.3 kg), SpO2 97.00%.  Labs: Lab Results  Component Value Date   WBC 6.7 12/01/2012   HGB 12.7 12/01/2012   HCT 38.7 12/01/2012   MCV 98.5 12/01/2012   PLT 169 12/01/2012     Recent Labs Lab 12/01/12 0420  NA 139  K 4.5  CL 104  CO2 25  BUN 31*  CREATININE 1.17*  CALCIUM 9.4  GLUCOSE 119*   Diagnostic Studies/Procedures   PV angio, see results  Discharge Medications     Medication List    TAKE these medications       acetaminophen 500 MG tablet  Commonly known as:  TYLENOL  Take 500 mg by mouth every 6 (six) hours as needed. For pain     amoxicillin 500 MG capsule  Commonly known as:  AMOXIL  Take 2,000 mg by mouth once. before dental procedure     b complex-vitamin c-folic acid 0.8 MG Tabs  Take 0.8 mg by mouth daily.     bifidobacterium infantis capsule  Take 1 capsule by mouth daily.     CALTRATE 600+D 600-400 MG-UNIT per tablet  Generic drug:  Calcium Carbonate-Vitamin D  Take 1 tablet by mouth daily.     clopidogrel 75 MG tablet  Commonly known as:  PLAVIX  Take 1 tablet (75 mg total) by mouth daily with breakfast.     CORICIDIN HBP CONGESTION/COUGH 10-200 MG Caps  Generic drug:  Dextromethorphan-Guaifenesin  Take 1  tablet by mouth every 6 (six) hours as needed. For cold or cough     cycloSPORINE modified 25 MG capsule  Commonly known as:  NEORAL  Take 25 mg by mouth 2 (two) times daily.     digoxin 0.125 MG tablet  Commonly known as:  LANOXIN  Take 0.125 mg by mouth daily.     docusate sodium 100 MG capsule  Commonly known as:  COLACE  Take 1 capsule (100 mg total) by mouth daily as needed for constipation.     enoxaparin 40 MG/0.4ML injection  Commonly known as:   LOVENOX  Inject 0.4 mLs (40 mg total) into the skin every 12 (twelve) hours. Until instructed to stop by Coumadin Clinic.     ergocalciferol 50000 UNITS capsule  Commonly known as:  VITAMIN D2  Take 50,000 Units by mouth every 30 (thirty) days. First of the month     furosemide 40 MG tablet  Commonly known as:  LASIX  Take 40 mg by mouth daily as needed (swelling).     HUMULIN 70/30 [place]  Inject 10 Units into the skin every morning.     hydroxypropyl methylcellulose 2.5 % ophthalmic solution  Commonly known as:  ISOPTO TEARS  Place 1 drop into both eyes 4 (four) times daily as needed (dry eyes).     Iron 66 MG Tabs  Take 1 tablet by mouth daily.     metoprolol 50 MG tablet  Commonly known as:  LOPRESSOR  Take 1 tablet (50 mg total) by mouth 2 (two) times daily.     mupirocin ointment 2 %  Commonly known as:  BACTROBAN  Apply 1 application topically daily. To toe and foot ulcers     mycophenolate 250 MG capsule  Commonly known as:  CELLCEPT  Take 500 mg by mouth 2 (two) times daily.     predniSONE 5 MG tablet  Commonly known as:  DELTASONE  Take 5 mg by mouth daily.     sitaGLIPtin 100 MG tablet  Commonly known as:  JANUVIA  Take 100 mg by mouth daily.     valACYclovir 500 MG tablet  Commonly known as:  VALTREX  Take 500 mg by mouth daily. Monday, Wednesday and Friday     warfarin 2.5 MG tablet  Commonly known as:  COUMADIN  Take 1.25-2.5 mg by mouth daily. 1.25mg  on Sunday and Thursdays, rest of the week takes a whole (2.5mg )  tablet.      Plavix written to take for 1 month. 8 syringes written for Lovenox (1 today, 2 tomorrow, 2 Saturday, 2 Sunday, and 1 on Monday, then Coumadin check - refills written x 1)   Disposition   The patient will be discharged in stable condition to home. Discharge Orders   Future Appointments Provider Department Dept Phone   12/05/2012 1:00 PM [facility] [phone]   12/13/2012 10:00  AM [name] Ouch, MD [facility] Main Office [place]) [phone]   Future Orders Complete By Expires     Diet - low sodium heart healthy  As directed     Increase activity slowly  As directed     Comments:      No driving for 2 days. No lifting over 5 lbs for 1 week. No sexual activity for 1 week. Keep procedure site clean & dry. If you notice increased pain, swelling, bleeding or pus, call/return!  You may shower, but no soaking baths/hot tubs/pools for 1 week.  Follow-up Information   Follow up with [facility]. (12/05/12 at 1pm. Make sure to keep this appointment - at that visit, they will tell you if you need to keep taking Lovenox or not)    Contact information:   [facility] [phone]      Follow up with Lorine Bears, MD. (12/13/12 at 10am)    Contact information:   [address] [place] [address] [phone]         Duration of Discharge Encounter: Greater than 30 minutes including physician and PA time.  Signed, Ronie Spies PA-C 12/01/2012, 12:25 PM

## 2012-12-01 NOTE — Progress Notes (Signed)
Patient had an 8 beat run of wide complex tachycardia observed by Dr. Excell Seltzer. Patient asymptomatic with ectopy and no change in plan of care per Dr. Excell Seltzer.

## 2012-12-01 NOTE — Progress Notes (Signed)
Patient: [name] [name] / Admit Date: 11/30/2012 / Date of Encounter: 12/01/2012, 9:57 AM   Subjective  Feels well, no complaints.   Objective   Telemetry: atrial fib, occasional V pacing and PVC  Physical Exam: Filed Vitals:   12/01/12 0737  BP: 151/55  Pulse: 70 (some of the pulse readings in EMR are saying 37 inaccurately - next to it are the correct values under ECG heart rate which has run 50-80 generally)  Temp: 97.3 F (36.3 C)  Resp: 16, 97% RA   General: Thin AAF in no acute distress. Head: Normocephalic, atraumatic, sclera non-icteric, no xanthomas, nares are without discharge. Neck: JVD not elevated. Lungs: Clear bilaterally to auscultation without wheezes, rales, or rhonchi. Breathing is unlabored. Heart: Irregular, soft murmur at apex, harsher murmur (but quieter) at RUSB, S1 S2 without rubs or gallops. Abdomen: Soft, non-tender, non-distended with normoactive bowel sounds. No hepatomegaly. No rebound/guarding. No obvious abdominal masses. Msk:  Strength and tone appear normal for age. Extremities: No clubbing or cyanosis. No edema.  Distal pedal pulses are 2+ and equal bilaterally. R groin site with very mild ecchymosis, no suppuration, hematoma or bruit Neuro: Alert and oriented X 3. Moves all extremities spontaneously. Psych:  Responds to questions appropriately with a normal affect.    Intake/Output Summary (Last 24 hours) at 12/01/12 0957 Last data filed at 12/01/12 0700  Gross per 24 hour  Intake 1692.5 ml  Output   1300 ml  Net  392.5 ml    Inpatient Medications:  . calcium-vitamin D  1 tablet Oral Daily  . clopidogrel  75 mg Oral Q breakfast  . cycloSPORINE modified  25 mg Oral BID  . digoxin  0.125 mg Oral Daily  . enoxaparin  40 mg Subcutaneous Q12H  . insulin aspart protamine-insulin aspart  10 Units Subcutaneous Q breakfast  . iron polysaccharides  150 mg Oral Daily  . metoprolol  50 mg Oral BID  . multivitamin  1 tablet Oral Daily  .  mupirocin ointment  1 application Topical Daily  . mycophenolate  500 mg Oral BID  . predniSONE  5 mg Oral Daily  . saccharomyces boulardii  250 mg Oral Daily  . valACYclovir  500 mg Oral Q M,W,F  . [START ON 12/06/2012] Vitamin D (Ergocalciferol)  50,000 Units Oral Q30 days  . warfarin  1.25 mg Oral Custom  . warfarin  2.5 mg Oral Custom  . Warfarin - Physician Dosing Inpatient   Does not apply q1800    Labs:  Recent Labs  12/01/12 0420  NA 139  K 4.5  CL 104  CO2 25  GLUCOSE 119*  BUN 31*  CREATININE 1.17*  CALCIUM 9.4   No results found for this basename: AST, ALT, ALKPHOS, BILITOT, PROT, ALBUMIN,  in the last 72 hours  Recent Labs  12/01/12 0420  WBC 6.7  HGB 12.7  HCT 38.7  MCV 98.5  PLT 169   Radiology/Studies:  See PV report   Assessment and Plan  1. PVD s/p L popliteral artery orbital atherectomy and balloon angioplasty & L  SFA orbital atherectomy and balloon angioplasty 11/30/12 - Plavix recommended for 1 month. F/u per MD. 2. Permanent atrial fibrillation with prior history of stroke - Coumadin restarted at home dose yesterday per [name]. Has Coumadin clinic appt scheduled already for 3/31. Lovenox started today (pt successfully administered her own dose). 3. CKD s/p renal transplant - Cr stable from pre-admit labs. Cont home immunosuppressives. 4. Mitral stenosis, severe and  mild AS - followed clinically.  Dr. Excell Seltzer to follow.  Signed, Ronie Spies PA-C  Patient seen, examined. Available data reviewed. Agree with findings, assessment, and plan as outlined by Ronie Spies, PA-C. Pt independently examined. She is doing well with stable creatinine following complex peripheral intervention by Dr Kirke Corin. Agree with continued warfarin, lovenox bridge, and plavix x 30 days. Ok for discharge today.  [name], M.D. 12/01/2012 10:57 AM

## 2012-12-05 ENCOUNTER — Ambulatory Visit (INDEPENDENT_AMBULATORY_CARE_PROVIDER_SITE_OTHER): Payer: Medicare Other | Admitting: *Deleted

## 2012-12-05 DIAGNOSIS — Z7901 Long term (current) use of anticoagulants: Secondary | ICD-10-CM

## 2012-12-05 DIAGNOSIS — I4891 Unspecified atrial fibrillation: Secondary | ICD-10-CM

## 2012-12-06 ENCOUNTER — Telehealth: Payer: Self-pay

## 2012-12-06 NOTE — Telephone Encounter (Signed)
**Note De-Identified [name] [name] Obfuscation** TCM call:  Pt states she is much better since her hosp discharge and that she has all of her medications. Also, she is aware of Coumadin check appt on 4/4 and eph f/u on 4/8 with [name]. Pt given office phone number to call if she has any questions or concerns, she verbalized understanding.

## 2012-12-09 ENCOUNTER — Ambulatory Visit (INDEPENDENT_AMBULATORY_CARE_PROVIDER_SITE_OTHER): Payer: Medicare Other

## 2012-12-09 DIAGNOSIS — I4891 Unspecified atrial fibrillation: Secondary | ICD-10-CM

## 2012-12-09 DIAGNOSIS — Z7901 Long term (current) use of anticoagulants: Secondary | ICD-10-CM

## 2012-12-13 ENCOUNTER — Ambulatory Visit (INDEPENDENT_AMBULATORY_CARE_PROVIDER_SITE_OTHER): Payer: Medicare Other | Admitting: Cardiovascular Disease

## 2012-12-13 ENCOUNTER — Encounter: Payer: Self-pay | Admitting: Cardiovascular Disease

## 2012-12-13 VITALS — BP 116/73 | HR 75 | Ht 60.0 in | Wt 94.1 lb

## 2012-12-13 DIAGNOSIS — I739 Peripheral vascular disease, unspecified: Secondary | ICD-10-CM

## 2012-12-13 NOTE — Patient Instructions (Addendum)
Your physician has requested that you have an ankle brachial index (ABI). During this test an ultrasound and blood pressure cuff are used to evaluate the arteries that supply the arms and legs with blood. Allow thirty minutes for this exam. There are no restrictions or special instructions.  Your physician has requested that you have a lower or upper extremity arterial duplex in 6 months. This test is an ultrasound of the arteries in the legs or arms. It looks at arterial blood flow in the legs and arms. Allow one hour for Lower and Upper Arterial scans. There are no restrictions or special instructions  Your physician wants you to follow-up in:  6 months, 1 week after doppler. You will receive a reminder letter in the mail two months in advance. If you don't receive a letter, please call our office to schedule the follow-up appointment.

## 2012-12-14 ENCOUNTER — Other Ambulatory Visit: Payer: Self-pay | Admitting: *Deleted

## 2012-12-14 MED ORDER — DIGOXIN 125 MCG PO TABS: 0.1250 mg | ORAL_TABLET | Freq: Every day | ORAL | Status: DC

## 2012-12-15 ENCOUNTER — Encounter: Payer: Self-pay | Admitting: Cardiovascular Disease

## 2012-12-15 ENCOUNTER — Other Ambulatory Visit: Payer: Self-pay | Admitting: *Deleted

## 2012-12-15 MED ORDER — METOPROLOL TARTRATE 50 MG PO TABS: 50.0000 mg | ORAL_TABLET | Freq: Two times a day (BID) | ORAL | Status: DC

## 2012-12-15 NOTE — Progress Notes (Signed)
Primary cardiologist: Dr. Excell Seltzer  HPI:  This is a pleasant 67 year old woman returning for followup evaluation after recent left lower extremity arterial angiography and endovascular intervention. She has known history of permanent atrial fibrillation, mitral stenosis, and lower extremity peripheral arterial disease. She was seen recently for severe left calf claudication associated with ulceration involving the left foot.  She underwent angiography which confirmed severe calcified disease in the left popliteal artery as well as the SFA. She underwent a complex procedure with atherectomy of the left popliteal artery and the left SFA. There was also one-vessel runoff below the knee with only patent anterior tibial artery which had diffuse 60% disease. She has been doing very well since the procedure. Left leg discomfort is now minimal. The ulcerations at the tip of the toes and heel are healing nicely.  Outpatient Encounter Prescriptions as of 12/13/2012  Medication Sig Dispense Refill  . acetaminophen (TYLENOL) 500 MG tablet Take 500 mg by mouth every 6 (six) hours as needed. For pain      . amoxicillin (AMOXIL) 500 MG capsule Take 2,000 mg by mouth once. before dental procedure      . b complex-vitamin c-folic acid (NEPHRO-VITE) 0.8 MG TABS Take 0.8 mg by mouth daily.        . bifidobacterium infantis (ALIGN) capsule Take 1 capsule by mouth daily.  21 capsule  0  . Calcium Carbonate-Vitamin D (CALTRATE 600+D) 600-400 MG-UNIT per tablet Take 1 tablet by mouth daily.      . clopidogrel (PLAVIX) 75 MG tablet Take 1 tablet (75 mg total) by mouth daily with breakfast.  30 tablet  0  . cycloSPORINE modified (NEORAL/GENGRAF) 25 MG capsule Take 25 mg by mouth 2 (two) times daily.        [name] Dextromethorphan-Guaifenesin (CORICIDIN HBP CONGESTION/COUGH) 10-200 MG CAPS Take 1 tablet by mouth every 6 (six) hours as needed. For cold or cough      . docusate sodium (COLACE) 100 MG capsule Take 1 capsule (100 mg  total) by mouth daily as needed for constipation.  30 capsule  1  . ergocalciferol (VITAMIN D2) 50000 UNITS capsule Take 50,000 Units by mouth every 30 (thirty) days. First of the month      . furosemide (LASIX) 40 MG tablet Take 40 mg by mouth daily as needed (swelling).       . hydroxypropyl methylcellulose (ISOPTO TEARS) 2.5 % ophthalmic solution Place 1 drop into both eyes 4 (four) times daily as needed (dry eyes).      . Insulin Isophane & Regular (HUMULIN 70/30 [place]) Inject 10 Units into the skin every morning.      . Iron 66 MG TABS Take 1 tablet by mouth daily.      . metoprolol (LOPRESSOR) 50 MG tablet Take 1 tablet (50 mg total) by mouth 2 (two) times daily.  60 tablet  11  . mupirocin ointment (BACTROBAN) 2 % Apply 1 application topically daily. To toe and foot ulcers      . mycophenolate (CELLCEPT) 250 MG capsule Take 500 mg by mouth 2 (two) times daily.       . predniSONE (DELTASONE) 5 MG tablet Take 5 mg by mouth daily.        . sitaGLIPtin (JANUVIA) 100 MG tablet Take 100 mg by mouth daily.        . valACYclovir (VALTREX) 500 MG tablet Take 500 mg by mouth daily. Monday, Wednesday and Friday       . warfarin (COUMADIN)  2.5 MG tablet Take 1.25-2.5 mg by mouth daily. 1/2 on Thursdays and 2.5mg  all other days      . [DISCONTINUED] digoxin (LANOXIN) 0.125 MG tablet Take 0.125 mg by mouth daily.      . [DISCONTINUED] enoxaparin (LOVENOX) 40 MG/0.4ML injection Inject 0.4 mLs (40 mg total) into the skin every 12 (twelve) hours. Until instructed to stop by Coumadin Clinic.  8 Syringe  1   No facility-administered encounter medications on file as of 12/13/2012.    Allergies  Allergen Reactions  . Bactrim (Sulfamethoxazole W-Trimethoprim) Itching  . Codeine Hives and Nausea Only  . Penicillins Nausea And Vomiting    Past Medical History  Diagnosis Date  . Diabetes mellitus   . Hypertension   . Chronic kidney disease     s/p renal transplant;  follows with [name]  . Stroke  December 2011    left MCA distribution  . Permanent atrial fibrillation     coumadin clinic at [facility]. Has previously required Lovenox->Coumadin bridge.  . Severe mitral stenosis by prior echocardiogram     last echo 07/2012  . Aortic stenosis   . Symptomatic bradycardia     s/p pacer  . Depression   . HSV (herpes simplex virus) infection   . Anemia   . PAD (peripheral artery disease)     a. s/p RLE angioplasty with [name]. b. (by [name])  L popliteral artery orbital atherectomy and balloon angioplasty & L SFA orbital atherectomy and balloon angioplasty 11/30/12.  [name] Hyperparathyroidism, primary     s/p parathyroidectomy    ROS: Negative except as per HPI  BP 116/73  Pulse 75  Ht 5' (1.524 m)  Wt 94 lb 1.9 oz (42.693 kg)  BMI 18.38 kg/m2  PHYSICAL EXAM: Pt is alert and oriented, NAD HEENT: normal Neck: JVP - normal, carotids 2+= without bruits Lungs: CTA bilaterally CV: Irregular with grade 2/6 holosystolic murmur at the apex Abd: soft, NT, Positive BS, no hepatomegaly Ext: There is no edema. Femoral pulses are 2+ bilaterally. Pedal pulses are nonpalpable bilaterally. The left foot shows a healing ulcer on the heel. The toes are longer tender.  ASSESSMENT AND PLAN: 1. Lower extremity peripheral arterial disease with rest pain and intermittent claudication. Status post recent atherectomy of the left popliteal artery and SFA with significant improvement in her symptoms. The ulcers are also healing nicely. She is to continue Plavix for one month and then switched to aspirin 81 mg once daily. She is already anticoagulated with warfarin for atrial fibrillation. I will request an ABI for evaluation. We might have to do an arterial duplex given that her arteries were noncompressible.  2. Mitral stenosis. The patient remains minimally symptomatic. Will continue clinical followup.  3. Permanent atrial fibrillation. She remains well rate controlled. She is  anticoagulated with warfarin.

## 2012-12-19 ENCOUNTER — Ambulatory Visit (INDEPENDENT_AMBULATORY_CARE_PROVIDER_SITE_OTHER): Payer: Medicare Other

## 2012-12-19 ENCOUNTER — Encounter (INDEPENDENT_AMBULATORY_CARE_PROVIDER_SITE_OTHER): Payer: Medicare Other | Admitting: Cardiology

## 2012-12-19 DIAGNOSIS — I739 Peripheral vascular disease, unspecified: Secondary | ICD-10-CM

## 2012-12-19 DIAGNOSIS — I4891 Unspecified atrial fibrillation: Secondary | ICD-10-CM

## 2012-12-19 DIAGNOSIS — Z7901 Long term (current) use of anticoagulants: Secondary | ICD-10-CM

## 2012-12-19 LAB — POCT INR: INR: 1.9

## 2013-01-02 ENCOUNTER — Ambulatory Visit (INDEPENDENT_AMBULATORY_CARE_PROVIDER_SITE_OTHER): Payer: Medicare Other | Admitting: *Deleted

## 2013-01-02 DIAGNOSIS — Z7901 Long term (current) use of anticoagulants: Secondary | ICD-10-CM

## 2013-01-02 DIAGNOSIS — I4891 Unspecified atrial fibrillation: Secondary | ICD-10-CM

## 2013-01-02 LAB — POCT INR: INR: 2.1

## 2013-01-09 ENCOUNTER — Other Ambulatory Visit: Payer: Self-pay

## 2013-01-09 MED ORDER — WARFARIN SODIUM 2.5 MG PO TABS: ORAL_TABLET | ORAL | Status: DC

## 2013-01-18 ENCOUNTER — Encounter: Payer: Self-pay | Admitting: Internal Medicine

## 2013-01-18 ENCOUNTER — Ambulatory Visit (INDEPENDENT_AMBULATORY_CARE_PROVIDER_SITE_OTHER): Payer: Medicare Other | Admitting: *Deleted

## 2013-01-18 DIAGNOSIS — I495 Sick sinus syndrome: Secondary | ICD-10-CM

## 2013-01-18 LAB — PACEMAKER DEVICE OBSERVATION
BRDY-0002RV: 50 {beats}/min
BRDY-0004RV: 130 {beats}/min
RV LEAD AMPLITUDE: 15.67 mv

## 2013-01-18 NOTE — Progress Notes (Signed)
PPM check in clinic 

## 2013-01-25 ENCOUNTER — Telehealth: Payer: Self-pay | Admitting: *Deleted

## 2013-01-27 ENCOUNTER — Telehealth: Payer: Self-pay | Admitting: *Deleted

## 2013-01-27 NOTE — Telephone Encounter (Signed)
Per Dr Johney Frame based on pacemaker check on 01-18-13---increase metoprolol to 75mg  bid. Pt aware of change and understands.

## 2013-02-02 ENCOUNTER — Ambulatory Visit (INDEPENDENT_AMBULATORY_CARE_PROVIDER_SITE_OTHER): Payer: Medicare Other | Admitting: Pharmacist

## 2013-02-02 DIAGNOSIS — I4891 Unspecified atrial fibrillation: Secondary | ICD-10-CM

## 2013-02-02 DIAGNOSIS — Z7901 Long term (current) use of anticoagulants: Secondary | ICD-10-CM

## 2013-02-02 LAB — POCT INR: INR: 2.7

## 2013-02-10 ENCOUNTER — Telehealth: Payer: Self-pay | Admitting: Internal Medicine

## 2013-02-10 NOTE — Telephone Encounter (Signed)
New Problem  Pt states [facility] is out of her medication Metoprolol, She said that it was increased to 75 mg from 50 mg. She wants to  Know what she needs to do.

## 2013-02-14 ENCOUNTER — Telehealth: Payer: Self-pay

## 2013-02-14 MED ORDER — METOPROLOL TARTRATE 50 MG PO TABS: 75.0000 mg | ORAL_TABLET | Freq: Two times a day (BID) | ORAL | Status: DC

## 2013-02-14 NOTE — Telephone Encounter (Signed)
..   Requested Prescriptions   Signed Prescriptions Disp Refills  . metoprolol (LOPRESSOR) 50 MG tablet 30 tablet 6    Sig: Take 1.5 tablets (75 mg total) by mouth 2 (two) times daily.    Authorizing Provider: Hillis Range    Ordering User: [name] [name], [name] [name]

## 2013-03-02 ENCOUNTER — Ambulatory Visit (INDEPENDENT_AMBULATORY_CARE_PROVIDER_SITE_OTHER): Payer: Medicare Other | Admitting: *Deleted

## 2013-03-02 DIAGNOSIS — Z7901 Long term (current) use of anticoagulants: Secondary | ICD-10-CM

## 2013-03-02 DIAGNOSIS — I4891 Unspecified atrial fibrillation: Secondary | ICD-10-CM

## 2013-03-08 ENCOUNTER — Telehealth: Payer: Self-pay | Admitting: Cardiovascular Disease

## 2013-03-08 MED ORDER — METOPROLOL TARTRATE 50 MG PO TABS: 75.0000 mg | ORAL_TABLET | Freq: Two times a day (BID) | ORAL | Status: DC

## 2013-03-08 NOTE — Telephone Encounter (Signed)
I left the pt a message to call the office about her medications.  I did contact [facility] and they do not have any pending prescriptions from Dr Excell Seltzer or Dr Kirke Corin.  Irving Burton at [facility] said the pt picked up her Metoprolol on 03/05/13. I will await a return phone call from the patient.

## 2013-03-08 NOTE — Telephone Encounter (Signed)
New Problem:    Patient called in stating that she had been trying to receive her medication for 3 weeks.  She had spoken with refills twice and her pharmacy still states that they never received it.  Patient ended the call before assistance could be given.  Please call back.

## 2013-03-08 NOTE — Telephone Encounter (Signed)
F/u   Patient has called again about metoprolol prescription and after I informed her of the msg below pt stated that her dosage was changed and that's what's causing her to run out of her medicine sooner.  [facility] patient was told that she needs a new prescription.

## 2013-03-08 NOTE — Telephone Encounter (Signed)
I reviewed the pt's previous Metoprolol Rx and the quantity sent in was #30. The pt needs a correct quantity of 90.  I have sent a new Rx into the pt's pharmacy for the correct quantity.  I have left the pt another message that the Rx has been updated.

## 2013-03-17 ENCOUNTER — Ambulatory Visit (INDEPENDENT_AMBULATORY_CARE_PROVIDER_SITE_OTHER): Payer: Medicare Other | Admitting: *Deleted

## 2013-03-17 DIAGNOSIS — I4891 Unspecified atrial fibrillation: Secondary | ICD-10-CM

## 2013-03-17 DIAGNOSIS — Z7901 Long term (current) use of anticoagulants: Secondary | ICD-10-CM

## 2013-03-28 ENCOUNTER — Telehealth: Payer: Self-pay | Admitting: Pharmacist

## 2013-03-28 NOTE — Telephone Encounter (Signed)
Pt called to report she started Levaquin 500mg  x 7 days yesterday.  Concerned may cause an issue with her INR.  Explained this may increase INR.  Pt has appt scheduled for 7/25 already.  Will continue current dose and recheck INR at that time.

## 2013-03-31 ENCOUNTER — Ambulatory Visit (INDEPENDENT_AMBULATORY_CARE_PROVIDER_SITE_OTHER): Payer: Medicare Other | Admitting: *Deleted

## 2013-03-31 DIAGNOSIS — I4891 Unspecified atrial fibrillation: Secondary | ICD-10-CM

## 2013-03-31 DIAGNOSIS — Z7901 Long term (current) use of anticoagulants: Secondary | ICD-10-CM

## 2013-04-21 ENCOUNTER — Ambulatory Visit (INDEPENDENT_AMBULATORY_CARE_PROVIDER_SITE_OTHER): Payer: Medicare Other | Admitting: *Deleted

## 2013-04-21 DIAGNOSIS — I4891 Unspecified atrial fibrillation: Secondary | ICD-10-CM

## 2013-04-21 DIAGNOSIS — Z7901 Long term (current) use of anticoagulants: Secondary | ICD-10-CM

## 2013-05-11 ENCOUNTER — Telehealth: Payer: Self-pay | Admitting: Cardiovascular Disease

## 2013-05-11 NOTE — Telephone Encounter (Signed)
I attempted to reach the pt but she has a voicemail that has not been set up at this time. I contacted Dr Chipper Herb office and they do have a clearance form that they will fax to our office for completion.  In reviewing this pt's chart she does require SBE and lovenox bridging for coumadin to be held. I will await clearance form.

## 2013-05-11 NOTE — Telephone Encounter (Signed)
New problem    Need a cardiac clearance for  7 tooth removal . Dr. Benson Norway office  [phone] .

## 2013-05-12 NOTE — Telephone Encounter (Signed)
Fax received in the office but page 2 is missing.  I called Dr Randa Evens office and they are closed today.  I will forward this message to the triage nurse to contact Dr Randa Evens office next week for page 2 of clearance request.

## 2013-05-15 NOTE — Telephone Encounter (Signed)
[phone] fax for Dr Randa Evens office.

## 2013-05-15 NOTE — Telephone Encounter (Signed)
[phone] phone number--office closed, try tomorrow.

## 2013-05-16 ENCOUNTER — Other Ambulatory Visit: Payer: Self-pay | Admitting: Internal Medicine

## 2013-05-16 NOTE — Telephone Encounter (Signed)
Surgical clearance to Dr. Excell Seltzer

## 2013-05-16 NOTE — Telephone Encounter (Signed)
Spoke with Jody at Dr. Randa Evens office and asked her to refax cardiac clearance form as we did not receive page 2.  Jody verified our fax number and is faxing form now.

## 2013-05-17 NOTE — Telephone Encounter (Signed)
Patient called to find out if surgical clearance has been sent as she received a call from Dr. Randa Evens office.  I advised patient that Dr. Excell Seltzer has the paperwork and is in the office today and that we will fax as soon as he is able to complete it.  Patient verbalized understanding and gratitude.

## 2013-05-19 ENCOUNTER — Ambulatory Visit (INDEPENDENT_AMBULATORY_CARE_PROVIDER_SITE_OTHER): Payer: Medicare Other

## 2013-05-19 DIAGNOSIS — I4891 Unspecified atrial fibrillation: Secondary | ICD-10-CM

## 2013-05-19 DIAGNOSIS — Z7901 Long term (current) use of anticoagulants: Secondary | ICD-10-CM

## 2013-05-23 ENCOUNTER — Other Ambulatory Visit: Payer: Self-pay | Admitting: Internal Medicine

## 2013-05-23 NOTE — Telephone Encounter (Signed)
Follow up   Pt calling in reference to previous message. Please call pt

## 2013-05-24 ENCOUNTER — Encounter: Payer: Self-pay | Admitting: Cardiovascular Disease

## 2013-05-24 NOTE — Telephone Encounter (Signed)
Left message on machine for pt to contact the office. Dr Excell Seltzer has completed form for dental clearance but the pt will need to be bridged with lovenox and require SBE. The pt should also no longer be taking Plavix. The pt should have stopped this in May 2014 and switched to ASA 81mg  daily per Dr Jari Sportsman note.

## 2013-05-24 NOTE — Telephone Encounter (Signed)
New problem  [name] [name],[name] [name] is returning you call.

## 2013-05-24 NOTE — Telephone Encounter (Signed)
This encounter was created in error - please disregard.

## 2013-05-24 NOTE — Telephone Encounter (Signed)
I spoke with the pt and made her aware of Dr Earmon Phoenix comments.  The pt did stop Plavix in May but never started ASA.  Since the pt is going to have dental extraction I advised her to start ASA 81mg  daily after this procedure.

## 2013-05-25 MED ORDER — ASPIRIN EC 81 MG PO TBEC: 81.0000 mg | DELAYED_RELEASE_TABLET | Freq: Every day | ORAL | Status: DC

## 2013-05-25 NOTE — Telephone Encounter (Signed)
I spoke with Dr Randa Evens office and confirmed that they did receive clearance form from our office.  I made [name] [name] aware that our Coumadin Clinic will need to be contacted with date of extraction so that Lovenox bridge can be arranged.

## 2013-05-30 ENCOUNTER — Telehealth: Payer: Self-pay

## 2013-05-30 NOTE — Telephone Encounter (Signed)
Pt is scheduled for oral surgery on 06/12/13.  Pt needs to hold Coumadin x 5 days prior to procedure per Dr Barbette Merino.  Per Dr Excell Seltzer pt will need Lovenox bridging while off Coumadin.  Need to bridge pt with Lovenox while off Coumadin prior to oral surgery on 06/12/13. Called spoke with pt advised last dosage of Coumadin will be on 06/06/13 prior to procedure on 06/12/13.  Scheduled appt for Lovenox bridging on 06/07/13 at 11:30.  Pt aware of instructions and appt date and time.

## 2013-06-02 NOTE — Progress Notes (Signed)
Patient ID: [name] [name], female   DOB: 1946-01-22, 67 y.o.   MRN: [id number] Erroneous encounter This encounter was created in error - please disregard.

## 2013-06-05 ENCOUNTER — Other Ambulatory Visit: Payer: Self-pay

## 2013-06-05 DIAGNOSIS — Z1231 Encounter for screening mammogram for malignant neoplasm of breast: Secondary | ICD-10-CM

## 2013-06-07 ENCOUNTER — Ambulatory Visit (INDEPENDENT_AMBULATORY_CARE_PROVIDER_SITE_OTHER): Payer: Medicare Other | Admitting: *Deleted

## 2013-06-07 DIAGNOSIS — I4891 Unspecified atrial fibrillation: Secondary | ICD-10-CM

## 2013-06-07 DIAGNOSIS — Z7901 Long term (current) use of anticoagulants: Secondary | ICD-10-CM

## 2013-06-07 LAB — POCT INR: INR: 2

## 2013-06-07 MED ORDER — ENOXAPARIN SODIUM 40 MG/0.4ML ~~LOC~~ SOLN: 40.0000 mg | Freq: Two times a day (BID) | SUBCUTANEOUS | Status: DC

## 2013-06-07 NOTE — Patient Instructions (Addendum)
06/06/2013 last day to take coumadin 06/07/2013 no coumadin no lovenox 06/08/2013 no coumadin Lovenox 40mg  8am and Lovenox 40mg  8pm 06/09/2013 no coumadin Lovenox 40 mg 8am and Lovenox 40 mg 8pm 06/10/2013 no coumadin Lovenox 40mg  8am and Lovenox 40mg  8pm 06/11/2013 no coumadin Lovenox 40mg  8am only  10.02/2013 no coumadin no lovenox  Day of procedure then when Doctor doing procedure states to restart coumadin and lovenox  Restart at same dose of lovenox 40 mg 8am and 40 mg 8pm and coumadin continue same dose except for 2 days take extra 1/2 tablet and continue lovenox and coumadin until seen in clinic on 06/15/2013

## 2013-06-12 ENCOUNTER — Telehealth: Payer: Self-pay

## 2013-06-12 MED ORDER — METOPROLOL TARTRATE 100 MG PO TABS: 100.0000 mg | ORAL_TABLET | Freq: Two times a day (BID) | ORAL | Status: DC

## 2013-06-12 MED ORDER — AMLODIPINE BESYLATE 5 MG PO TABS: 5.0000 mg | ORAL_TABLET | Freq: Every day | ORAL | Status: DC

## 2013-06-12 NOTE — Telephone Encounter (Signed)
Follow up    Please advise pt on previous message. If pt doesn't;t answer please call cell number [phone].

## 2013-06-12 NOTE — Telephone Encounter (Signed)
I spoke with the pt and her BP at home this morning was 148/80.  The pt said she was very anxious in dental office.  I made her aware of medication changes recommended by Dr Excell Seltzer. I will send in new prescriptions. I also spoke with Jasmine December in the coumadin clinic and she will call the pt. Will plan to follow-up BP at appointment with Dr Kirke Corin.

## 2013-06-12 NOTE — Telephone Encounter (Signed)
[name] [name] called the office stating that the pt is in the office for her dental extraction.  The pt's BP on arrival was 210/120 and upon recheck was 197/130.  The pt did take her morning medications.  Dr Teola Bradley will not do extraction today due to elevated BP.  The pt has been rescheduled to 10/8 at 7:15 and the pt will be given Versed with procedure.  I will discuss this pt with Dr Excell Seltzer for further BP management prior to procedure. I will also forward this message to the Coumadin Clinic to make sure the pt has enough Lovenox.

## 2013-06-12 NOTE — Telephone Encounter (Signed)
Recommend add amlodipine 5 mg daily, first dose to be taken this evening. Would increase metoprolol to 100 mg twice daily starting tonight. Will bring in for next available followup to assess blood pressure.  [name] 06/12/2013 1:21 PM

## 2013-06-12 NOTE — Telephone Encounter (Signed)
Received call from Senaida Lange nurse that pt had gone for dental extraction this am and B/P was elevated so extraction cancelled and rescheduled for Wednesday 06/14/2013. Dr Excell Seltzer has made adjustments in her B/P medications and pt is aware This nurse called pt and explained that she needs to take Lovenox 40 mg injection tonight and lovenox 40 mg injection on Tuesday Am and then no coumadin nor lovenox on day of procedure 06/14/2013 and when instructed by MD doing extraction to restart coumadin  and lovenox restart at same dose lovenox 40mg  am and lovenox 40 mg pm and coumadin at same dose except take extra 1/2 tablet for 2 days and then appt is changed for pt to be seen in clinic on Friday and pt states understanding of these instructions

## 2013-06-13 ENCOUNTER — Encounter (HOSPITAL_COMMUNITY): Payer: Medicare Other

## 2013-06-13 ENCOUNTER — Encounter: Payer: Self-pay | Admitting: Internal Medicine

## 2013-06-14 ENCOUNTER — Telehealth: Payer: Self-pay | Admitting: Cardiovascular Disease

## 2013-06-14 ENCOUNTER — Encounter: Payer: Self-pay | Admitting: Internal Medicine

## 2013-06-14 NOTE — Telephone Encounter (Signed)
New message   Had dental surgery this am. Continue coumadin and lovenox?

## 2013-06-14 NOTE — Telephone Encounter (Signed)
Called spoke with Tammy, pt's family member advised if ok with DDS to resume Coumadin take 1.5 tablets today and tomorrow, then resume previous dosage 1 tablet daily except 1/2 tablet on Tuesdays and Saturdays.  Advised probably wouldn't restart Lovenox until tomorrow if ok with DDS, secondary to bleeding risk. Advised once ok to resume start taking twice daily until recheck INR.  Pt's family member verbalized understanding.

## 2013-06-15 ENCOUNTER — Encounter (HOSPITAL_COMMUNITY): Payer: Medicare Other

## 2013-06-16 ENCOUNTER — Ambulatory Visit (INDEPENDENT_AMBULATORY_CARE_PROVIDER_SITE_OTHER): Payer: Medicare Other | Admitting: General Practice

## 2013-06-16 ENCOUNTER — Ambulatory Visit (HOSPITAL_COMMUNITY): Payer: Medicare Other

## 2013-06-16 DIAGNOSIS — I4891 Unspecified atrial fibrillation: Secondary | ICD-10-CM

## 2013-06-16 DIAGNOSIS — Z7901 Long term (current) use of anticoagulants: Secondary | ICD-10-CM

## 2013-06-16 LAB — POCT INR: INR: 1.2

## 2013-06-16 MED ORDER — ENOXAPARIN SODIUM 40 MG/0.4ML ~~LOC~~ SOLN: 40.0000 mg | Freq: Two times a day (BID) | SUBCUTANEOUS | Status: DC

## 2013-06-19 ENCOUNTER — Telehealth: Payer: Self-pay | Admitting: Cardiovascular Disease

## 2013-06-19 ENCOUNTER — Encounter (HOSPITAL_COMMUNITY): Payer: Self-pay | Admitting: Emergency Medicine

## 2013-06-19 ENCOUNTER — Encounter (HOSPITAL_COMMUNITY): Payer: Medicare Other

## 2013-06-19 ENCOUNTER — Emergency Department (HOSPITAL_COMMUNITY)
Admission: EM | Admit: 2013-06-19 | Discharge: 2013-06-19 | Disposition: A | Discharge status: Home / Self Care | Payer: Medicare Other | Attending: Emergency Medicine | Admitting: Emergency Medicine

## 2013-06-19 DIAGNOSIS — K068 Other specified disorders of gingiva and edentulous alveolar ridge: Secondary | ICD-10-CM

## 2013-06-19 DIAGNOSIS — K055 Other periodontal diseases: Secondary | ICD-10-CM | POA: Insufficient documentation

## 2013-06-19 DIAGNOSIS — D649 Anemia, unspecified: Secondary | ICD-10-CM | POA: Insufficient documentation

## 2013-06-19 DIAGNOSIS — Z880 Allergy status to penicillin: Secondary | ICD-10-CM | POA: Insufficient documentation

## 2013-06-19 DIAGNOSIS — E119 Type 2 diabetes mellitus without complications: Secondary | ICD-10-CM | POA: Insufficient documentation

## 2013-06-19 DIAGNOSIS — Z79899 Other long term (current) drug therapy: Secondary | ICD-10-CM | POA: Insufficient documentation

## 2013-06-19 DIAGNOSIS — Z7901 Long term (current) use of anticoagulants: Secondary | ICD-10-CM | POA: Insufficient documentation

## 2013-06-19 DIAGNOSIS — I4891 Unspecified atrial fibrillation: Secondary | ICD-10-CM | POA: Insufficient documentation

## 2013-06-19 DIAGNOSIS — N189 Chronic kidney disease, unspecified: Secondary | ICD-10-CM | POA: Insufficient documentation

## 2013-06-19 DIAGNOSIS — Z8673 Personal history of transient ischemic attack (TIA), and cerebral infarction without residual deficits: Secondary | ICD-10-CM | POA: Insufficient documentation

## 2013-06-19 DIAGNOSIS — Z87891 Personal history of nicotine dependence: Secondary | ICD-10-CM | POA: Insufficient documentation

## 2013-06-19 DIAGNOSIS — Z794 Long term (current) use of insulin: Secondary | ICD-10-CM | POA: Insufficient documentation

## 2013-06-19 DIAGNOSIS — Z8659 Personal history of other mental and behavioral disorders: Secondary | ICD-10-CM | POA: Insufficient documentation

## 2013-06-19 DIAGNOSIS — Z91040 Latex allergy status: Secondary | ICD-10-CM | POA: Insufficient documentation

## 2013-06-19 DIAGNOSIS — Z8619 Personal history of other infectious and parasitic diseases: Secondary | ICD-10-CM | POA: Insufficient documentation

## 2013-06-19 DIAGNOSIS — I129 Hypertensive chronic kidney disease with stage 1 through stage 4 chronic kidney disease, or unspecified chronic kidney disease: Secondary | ICD-10-CM | POA: Insufficient documentation

## 2013-06-19 LAB — CBC WITH DIFFERENTIAL/PLATELET
Basophils Absolute: 0 10*3/uL (ref 0.0–0.1)
Eosinophils Relative: 3 % (ref 0–5)
HCT: 40 % (ref 36.0–46.0)
Hemoglobin: 13.4 g/dL (ref 12.0–15.0)
Lymphocytes Relative: 31 % (ref 12–46)
MCV: 98.5 fL (ref 78.0–100.0)
Monocytes Absolute: 0.8 10*3/uL (ref 0.1–1.0)
Monocytes Relative: 14 % — ABNORMAL HIGH (ref 3–12)
Neutro Abs: 3.1 10*3/uL (ref 1.7–7.7)
RDW: 14.5 % (ref 11.5–15.5)
WBC: 6 10*3/uL (ref 4.0–10.5)

## 2013-06-19 LAB — BASIC METABOLIC PANEL
BUN: 23 mg/dL (ref 6–23)
CO2: 24 mEq/L (ref 19–32)
Calcium: 10.1 mg/dL (ref 8.4–10.5)
Creatinine, Ser: 1.11 mg/dL — ABNORMAL HIGH (ref 0.50–1.10)
GFR calc Af Amer: 59 mL/min — ABNORMAL LOW (ref >90)
Glucose, Bld: 132 mg/dL — ABNORMAL HIGH (ref 70–99)
Potassium: 4.2 mEq/L (ref 3.5–5.1)

## 2013-06-19 LAB — PROTIME-INR: Prothrombin Time: 16.8 seconds — ABNORMAL HIGH (ref 11.6–15.2)

## 2013-06-19 LAB — GLUCOSE, CAPILLARY: Glucose-Capillary: 156 mg/dL — ABNORMAL HIGH (ref 70–99)

## 2013-06-19 NOTE — ED Provider Notes (Signed)
CSN: [id number]     Arrival date & time 06/19/13  1000 History   First MD Initiated Contact with Patient 06/19/13 1002     Chief Complaint  Patient presents with  . Bleeding gums/coumadin    (Consider location/radiation/quality/duration/timing/severity/associated sxs/prior Treatment) HPI Comments: Patient is a 67 year old female with a past medical history of diabetes, atrial fibrillation, hypertension, and kidney transplant who is 5 days s/p tooth extraction who presents with bleeding gums. Patient reports gradual onset of bleeding gums 3 days ago. Patient estimates about 1 cup of blood lost in the past 3 days. Patient has "tried everything" to make the bleeding stop without relief. She applied pressure, used tea bags, and had her INR checked by her doctor, which was 1.2. Patient contacted the dentist who instructed her to come to the ED since she is on Coumadin. Patient has also been taking Lovenox due to her dental surgery. No other associated symptoms.    Past Medical History  Diagnosis Date  . Diabetes mellitus   . Hypertension   . Chronic kidney disease     s/p renal transplant;  follows with [name]  . Stroke December 2011    left MCA distribution  . Permanent atrial fibrillation     coumadin clinic at [facility]. Has previously required Lovenox->Coumadin bridge.  . Severe mitral stenosis by prior echocardiogram     last echo 07/2012  . Aortic stenosis   . Symptomatic bradycardia     s/p pacer  . Depression   . HSV (herpes simplex virus) infection   . Anemia   . PAD (peripheral artery disease)     a. s/p RLE angioplasty with [name]. b. (by [name])  L popliteral artery orbital atherectomy and balloon angioplasty & L SFA orbital atherectomy and balloon angioplasty 11/30/12.  [name] Hyperparathyroidism, primary     s/p parathyroidectomy   Past Surgical History  Procedure Laterality Date  . Insert / replace / remove pacemaker    . Kidney transplant   December 1999  . Abdominal angiogram  11/30/2012    ABDOMINAL AORTIC ANGIOGRAM   DR ARDIA  . Dialysis fistula creation     Family History  Problem Relation Age of Onset  . Kidney disease Brother     died from  . Kidney disease Sister     died from   History  Substance Use Topics  . Smoking status: Former Games developer  . Smokeless tobacco: Never Used  . Alcohol Use: No   OB History   Grav Para Term Preterm Abortions TAB SAB Ect Mult Living                 Review of Systems  HENT: Positive for dental problem and mouth sores.   All other systems reviewed and are negative.    Allergies  Bactrim; Codeine; Latex; and Penicillins  Home Medications   Current Outpatient Rx  Name  Route  Sig  Dispense  Refill  . acetaminophen (TYLENOL) 500 MG tablet   Oral   Take 500 mg by mouth every 6 (six) hours as needed. For pain         . amLODipine (NORVASC) 5 MG tablet   Oral   Take 1 tablet (5 mg total) by mouth daily.   30 tablet   6   . amoxicillin (AMOXIL) 500 MG capsule   Oral   Take 2,000 mg by mouth once. before dental procedure         .  b complex-vitamin c-folic acid (NEPHRO-VITE) 0.8 MG TABS   Oral   Take 0.8 mg by mouth daily.           . Calcium Carbonate-Vitamin D (CALTRATE 600+D) 600-400 MG-UNIT per tablet   Oral   Take 1 tablet by mouth daily.         . cycloSPORINE modified (NEORAL/GENGRAF) 25 MG capsule   Oral   Take 25 mg by mouth 2 (two) times daily.           . digoxin (LANOXIN) 0.125 MG tablet   Oral   Take 1 tablet (0.125 mg total) by mouth daily.   90 tablet   3   . enoxaparin (LOVENOX) 40 MG/0.4ML injection   Subcutaneous   Inject 0.4 mLs (40 mg total) into the skin every 12 (twelve) hours.   10 Syringe   1   . ergocalciferol (VITAMIN D2) 50000 UNITS capsule   Oral   Take 50,000 Units by mouth every 30 (thirty) days. First of the month         . fluticasone (FLONASE) 50 MCG/ACT nasal spray   Nasal   Place 2 sprays into the nose  daily.         . furosemide (LASIX) 40 MG tablet   Oral   Take 40 mg by mouth daily as needed (swelling).          . hydroxypropyl methylcellulose (ISOPTO TEARS) 2.5 % ophthalmic solution   Both Eyes   Place 1 drop into both eyes 4 (four) times daily as needed (dry eyes).         . Insulin Isophane & Regular (HUMULIN 70/30 [place])   Subcutaneous   Inject 10 Units into the skin every morning.         . Iron 66 MG TABS   Oral   Take 1 tablet by mouth daily.         . metoprolol (LOPRESSOR) 100 MG tablet   Oral   Take 1 tablet (100 mg total) by mouth 2 (two) times daily.   60 tablet   6   . mycophenolate (CELLCEPT) 250 MG capsule   Oral   Take 500 mg by mouth 2 (two) times daily.          . predniSONE (DELTASONE) 5 MG tablet   Oral   Take 5 mg by mouth daily.           . sitaGLIPtin (JANUVIA) 100 MG tablet   Oral   Take 100 mg by mouth daily.           . valACYclovir (VALTREX) 500 MG tablet   Oral   Take 500 mg by mouth daily. Monday, Wednesday and Friday          . warfarin (COUMADIN) 2.5 MG tablet   Oral   Take 1.25-2.5 mg by mouth daily. Take 1.25mg  on Tuesday and Saturday. Take 2.5mg  the rest of the week          BP 147/100  Pulse 64  Temp(Src) 97.9 F (36.6 C) (Oral)  Resp 18  SpO2 100% Physical Exam  Nursing note and vitals reviewed. Constitutional: She is oriented to person, place, and time. She appears well-developed and well-nourished. No distress.  HENT:  Head: Normocephalic and atraumatic.  Mouth/Throat: Oropharynx is clear and moist. No oropharyngeal exudate.  Sutures intact of posterior gums of upper and lower jaw. Mild oozing of blood at the site of the previous tooth.   Eyes: Conjunctivae  and EOM are normal. No scleral icterus.  Neck: Normal range of motion.  Cardiovascular: Normal rate and regular rhythm.  Exam reveals no gallop and no friction rub.   No murmur heard. Pulmonary/Chest: Effort normal and breath sounds normal.  She has no wheezes. She has no rales. She exhibits no tenderness.  Musculoskeletal: Normal range of motion.  Neurological: She is alert and oriented to person, place, and time. Coordination normal.  Speech is goal-oriented. Moves limbs without ataxia.   Skin: Skin is warm and dry.  Psychiatric: She has a normal mood and affect. Her behavior is normal.    ED Course  Procedures (including critical care time) Labs Review Labs Reviewed  CBC WITH DIFFERENTIAL - Abnormal; Notable for the following:    Monocytes Relative 14 (*)    All other components within normal limits  BASIC METABOLIC PANEL - Abnormal; Notable for the following:    Glucose, Bld 132 (*)    Creatinine, Ser 1.11 (*)    GFR calc non Af Amer 51 (*)    GFR calc Af Amer 59 (*)    All other components within normal limits  PROTIME-INR - Abnormal; Notable for the following:    Prothrombin Time 16.8 (*)    All other components within normal limits  GLUCOSE, CAPILLARY - Abnormal; Notable for the following:    Glucose-Capillary 156 (*)    All other components within normal limits   Imaging Review No results found.  EKG Interpretation   None       MDM   1. Bleeding gums     10:49 AM Labs pending. Gums are not bleeding at this time. Vitals stable and patient afebrile.   12:46 PM Patient's INR 1.4. Patient's tooth extraction site have stopped bleeding. Patient will be discharged with instructions to follow up with her dentist. Vitals stable and patient afebrile. No further evaluation needed at this time.     Emilia Beck, PA-C 06/19/13 1549

## 2013-06-19 NOTE — ED Notes (Signed)
Pt gums bleeding controlled.

## 2013-06-19 NOTE — ED Notes (Signed)
Placed on cardiac monitor to access pt HR more accurately.

## 2013-06-19 NOTE — ED Notes (Signed)
Pt fell on Friday and hit right side but saw on md.

## 2013-06-19 NOTE — Telephone Encounter (Signed)
Spoke with Tammy who is with [name] [name] and she states she has been bleeding during the night and has had some clots noted so instructed to go to ER now and Tammy states she is on the way to ER with her at present.Also instructed to inform ER she is on Lovenox and coumadin and she states understanding

## 2013-06-19 NOTE — ED Notes (Signed)
Pt had 7 teeth pulled last Wednesday and patient is on coumadin.  Pt has gauze in her mouth.  Pt states that her gums started bleeding on Saturday.

## 2013-06-19 NOTE — Telephone Encounter (Signed)
New Problem  Pt recently had oral surgery.. Bleeding really bad.. Wants to know if she should go to the ER or come in the coumadin check.

## 2013-06-19 NOTE — ED Notes (Signed)
Pt given mask and gauze to take home

## 2013-06-20 ENCOUNTER — Ambulatory Visit (INDEPENDENT_AMBULATORY_CARE_PROVIDER_SITE_OTHER): Payer: Medicare Other | Admitting: Cardiovascular Disease

## 2013-06-20 ENCOUNTER — Telehealth: Payer: Self-pay | Admitting: Cardiovascular Disease

## 2013-06-20 ENCOUNTER — Ambulatory Visit: Payer: Medicare Other | Admitting: Cardiovascular Disease

## 2013-06-20 DIAGNOSIS — I4891 Unspecified atrial fibrillation: Secondary | ICD-10-CM

## 2013-06-20 NOTE — Telephone Encounter (Signed)
New Problem  Pt called to give Ptinr: 1.4

## 2013-06-20 NOTE — Telephone Encounter (Signed)
Pt states she was informed by ER physician yesterday not to take any more Lovenox and she states she is not going to take any more Lovenox. Pt instructed regarding dose of coumadin and encounter done. PT states she is no longer bleeding at present time.Pt states yesterday was passing clots and that is when was sent to ER.Pt is to call dentist today as she states there is stitch  loose in her mouth.See coumadin encounter

## 2013-06-21 ENCOUNTER — Ambulatory Visit: Payer: Medicare Other

## 2013-06-21 NOTE — ED Provider Notes (Signed)
Medical screening examination/treatment/procedure(s) were performed by non-physician practitioner and as supervising physician I was immediately available for consultation/collaboration.    Armin Yerger W. Heather Mckendree, MD 06/21/13 1027 

## 2013-06-22 ENCOUNTER — Other Ambulatory Visit: Payer: Self-pay | Admitting: Nephrology

## 2013-06-22 DIAGNOSIS — R748 Abnormal levels of other serum enzymes: Secondary | ICD-10-CM

## 2013-06-23 ENCOUNTER — Ambulatory Visit (INDEPENDENT_AMBULATORY_CARE_PROVIDER_SITE_OTHER): Payer: Medicare Other | Admitting: *Deleted

## 2013-06-23 DIAGNOSIS — I4891 Unspecified atrial fibrillation: Secondary | ICD-10-CM

## 2013-06-23 DIAGNOSIS — Z7901 Long term (current) use of anticoagulants: Secondary | ICD-10-CM

## 2013-06-23 LAB — POCT INR: INR: 2.8

## 2013-06-27 ENCOUNTER — Encounter: Payer: Medicare Other | Attending: Emergency Medicine | Admitting: Dietician

## 2013-06-27 ENCOUNTER — Encounter: Payer: Self-pay | Admitting: Dietician

## 2013-06-27 VITALS — Ht 60.0 in | Wt 89.3 lb

## 2013-06-27 DIAGNOSIS — R634 Abnormal weight loss: Secondary | ICD-10-CM

## 2013-06-27 DIAGNOSIS — Z713 Dietary counseling and surveillance: Secondary | ICD-10-CM | POA: Insufficient documentation

## 2013-06-27 NOTE — Patient Instructions (Signed)
Eat 3 meals and 3 snacks per day. Have soft proteins with fat such as eggs with cheese, whole milk, peanut butter, whole milk cottage cheese, yogurt, mashed beans,and fish (white fish, salmon, or tuna). Add fat such as butter, olive oil, half and half, cream cheese, cheese sauce, and sour cream to foods. Eat whenever you are hungry (have more dinner later in the evening if still hungry).  Check in with dentist to see when it is ok to advance diet.

## 2013-06-27 NOTE — Progress Notes (Signed)
  Medical Nutrition Therapy:  Appt start time: 1130 end time:  1230.   Assessment:  Primary concerns today: [name] [name] is here today since she is losing weight after surgery and is now underweight (BMI of 17.4). She had 7 teeth extracted on 06/14/13 and then went to the ED on 06/19/13 with bleeding gums. Is currently on a soft food diet. States her appetite is normal but her mouth is still hurting. States she has been eating a lot less since her surgery since she is trying to follow the soft food diet.   Before surgery she weight 94 pounds. Normal weight had been 90-91 lbs for the last few years. Diagnosed with diabetes in 2007. Hgb A1c has been running 6.0-7.0% and most recently was 7.7% in September. Has had a lot of stress including family issues and blood clot/stent surgeries. Feeling very tired recently.  Tests her blood sugar 3 x day and averaging 105-112 fasting, 160 around lunch, and 170-200 in the evening. Has had frequent episodes of hypoglycemia overnight so tries to keep blood glucose above 150 before bed. Lives by herself and prepares her own food.    MEDICATIONS: coumadin, insulin, prednisone, taking Miralax for constipation    DIETARY INTAKE:  Avoided foods include hard foods, limited Vitamin K (for coumadin)   24-hr recall:  B ( AM): grits with 2 scrambled egg with cheese jello, glass of whole milk, and water  Snk ( AM): pudding or applesauce with apple juice  L ( PM): peanut butter sandwich and whole milk (not having bread lately d/t soft food diet) Snk ( PM): sometimes will have a banana with water and ginger ale D ( PM): pasta with baked chicken or fish (not lately) Snk ( PM): sometimes if BG is not elevated will have peanut butter  Beverages: water or whole milk  Usual physical activity: walking everyday for 15 minutes and doing 2 pounds and leg exercises  Estimated energy needs: 1600 calories 180 g carbohydrates 120 g protein 44 g fat  Progress Towards Goal(s):  In  progress.   Nutritional Diagnosis:  [place]-3.2 Unintentional weight loss As related to soft food diet post oral surgery.  As evidenced by 4% weight loss in past two weeks..    Intervention:  Nutrition counseling provided. Encouraged Kayia to eat frequent meals and snacks with soft protein and fat to encourage weight gain. Will follow up in two weeks to discuss carb counting and diabetes management.   Plan: Eat 3 meals and 3 snacks per day. Have soft proteins with fat such as eggs with cheese, whole milk, peanut butter, whole milk cottage cheese, yogurt, mashed beans,and fish (white fish, salmon, or tuna). Add fat such as butter, olive oil, half and half, cream cheese, cheese sauce, and sour cream to foods. Eat whenever you are hungry (have more dinner later in the evening if still hungry).  Check in with dentist to see when it is ok to advance diet.   Handouts given during visit include:  Nutrition Recommendation After Oral Surgery for People With Diabetes   Underweight Nutrition Therapy  Monitoring/Evaluation:  Dietary intake, exercise, and body weight in 2 week(s).

## 2013-06-28 ENCOUNTER — Telehealth: Payer: Self-pay | Admitting: Cardiovascular Disease

## 2013-06-28 NOTE — Telephone Encounter (Signed)
New problem:  Pt states she was has some questions about her PV study coming up. Please advise

## 2013-06-28 NOTE — Telephone Encounter (Signed)
Attempted to reach the pt but she has a voicemail that has not been set up. I called the pt's cell phone and was able to leave a detailed message about her appointments that are scheduled 06/30/13.

## 2013-06-29 ENCOUNTER — Other Ambulatory Visit: Payer: Medicare Other

## 2013-06-30 ENCOUNTER — Encounter (HOSPITAL_COMMUNITY): Payer: Medicare Other

## 2013-06-30 ENCOUNTER — Ambulatory Visit (HOSPITAL_COMMUNITY): Payer: Medicare Other | Attending: Cardiovascular Disease

## 2013-06-30 ENCOUNTER — Ambulatory Visit (INDEPENDENT_AMBULATORY_CARE_PROVIDER_SITE_OTHER): Payer: Medicare Other | Admitting: *Deleted

## 2013-06-30 DIAGNOSIS — I700 Atherosclerosis of aorta: Secondary | ICD-10-CM

## 2013-06-30 DIAGNOSIS — Z7901 Long term (current) use of anticoagulants: Secondary | ICD-10-CM

## 2013-06-30 DIAGNOSIS — Z87891 Personal history of nicotine dependence: Secondary | ICD-10-CM | POA: Insufficient documentation

## 2013-06-30 DIAGNOSIS — I739 Peripheral vascular disease, unspecified: Secondary | ICD-10-CM

## 2013-06-30 DIAGNOSIS — I10 Essential (primary) hypertension: Secondary | ICD-10-CM | POA: Insufficient documentation

## 2013-06-30 DIAGNOSIS — E119 Type 2 diabetes mellitus without complications: Secondary | ICD-10-CM | POA: Insufficient documentation

## 2013-06-30 DIAGNOSIS — I70209 Unspecified atherosclerosis of native arteries of extremities, unspecified extremity: Secondary | ICD-10-CM | POA: Insufficient documentation

## 2013-06-30 DIAGNOSIS — I4891 Unspecified atrial fibrillation: Secondary | ICD-10-CM

## 2013-06-30 DIAGNOSIS — Z8673 Personal history of transient ischemic attack (TIA), and cerebral infarction without residual deficits: Secondary | ICD-10-CM | POA: Insufficient documentation

## 2013-06-30 LAB — POCT INR: INR: 2.5

## 2013-07-03 ENCOUNTER — Encounter (HOSPITAL_COMMUNITY): Payer: Medicare Other

## 2013-07-07 ENCOUNTER — Ambulatory Visit: Payer: Medicare Other

## 2013-07-10 ENCOUNTER — Other Ambulatory Visit: Payer: Self-pay | Admitting: Nephrology

## 2013-07-10 ENCOUNTER — Ambulatory Visit
Admission: RE | Admit: 2013-07-10 | Discharge: 2013-07-10 | Disposition: A | Discharge status: Home / Self Care | Payer: Medicare Other | Source: Ambulatory Visit | Attending: Nephrology | Admitting: Nephrology

## 2013-07-10 DIAGNOSIS — R748 Abnormal levels of other serum enzymes: Secondary | ICD-10-CM

## 2013-07-13 ENCOUNTER — Ambulatory Visit: Payer: Medicare Other | Admitting: Dietician

## 2013-07-14 ENCOUNTER — Ambulatory Visit (INDEPENDENT_AMBULATORY_CARE_PROVIDER_SITE_OTHER): Payer: Medicare Other | Admitting: Pharmacist

## 2013-07-14 DIAGNOSIS — Z7901 Long term (current) use of anticoagulants: Secondary | ICD-10-CM

## 2013-07-14 DIAGNOSIS — I4891 Unspecified atrial fibrillation: Secondary | ICD-10-CM

## 2013-07-14 LAB — POCT INR: INR: 2.6

## 2013-07-24 ENCOUNTER — Encounter (INDEPENDENT_AMBULATORY_CARE_PROVIDER_SITE_OTHER): Payer: Self-pay

## 2013-07-24 ENCOUNTER — Ambulatory Visit (INDEPENDENT_AMBULATORY_CARE_PROVIDER_SITE_OTHER): Payer: Medicare Other | Admitting: Internal Medicine

## 2013-07-24 ENCOUNTER — Encounter: Payer: Self-pay | Admitting: Internal Medicine

## 2013-07-24 VITALS — BP 136/91 | HR 75 | Ht 60.0 in | Wt 93.8 lb

## 2013-07-24 DIAGNOSIS — I4891 Unspecified atrial fibrillation: Secondary | ICD-10-CM

## 2013-07-24 DIAGNOSIS — Z950 Presence of cardiac pacemaker: Secondary | ICD-10-CM

## 2013-07-24 DIAGNOSIS — I739 Peripheral vascular disease, unspecified: Secondary | ICD-10-CM

## 2013-07-24 DIAGNOSIS — I495 Sick sinus syndrome: Secondary | ICD-10-CM

## 2013-07-24 DIAGNOSIS — I050 Rheumatic mitral stenosis: Secondary | ICD-10-CM

## 2013-07-24 LAB — MDC_IDC_ENUM_SESS_TYPE_INCLINIC
Battery Voltage: 2.78 V
Brady Statistic RV Percent Paced: 36.9 %
Lead Channel Pacing Threshold Amplitude: 0.75 V
Lead Channel Pacing Threshold Pulse Width: 0.4 ms
Lead Channel Sensing Intrinsic Amplitude: 11.2 mV
Lead Channel Setting Sensing Sensitivity: 5.6 mV

## 2013-07-24 NOTE — Progress Notes (Signed)
PCP: Shelba Flake, MD Primary Cardiologist: Calton Dach, MD Sees Dr Kirke Corin for PVD  [name] [name] is a 67 y.o. female who presents today for routine electrophysiology followup.  Since last being seen in our clinic, the patient reports doing reasonably well.  She has severe MS but is minimally symptomatic. She has undergone SFA angioplasty since I saw her last.  She reports that her PVD is much better.  She is very pleased with the results.  Today, she denies symptoms of palpitations, chest pain, shortness of breath,  lower extremity edema, dizziness, presyncope, or syncope.  The patient is otherwise without complaint today.   She is s/p atherectomy of the left popliteal artery and the left SFA 11-2012 by Dr Excell Seltzer.    Past Medical History  Diagnosis Date  . Diabetes mellitus   . Hypertension   . Chronic kidney disease     s/p renal transplant;  follows with [name]  . Stroke December 2011    left MCA distribution  . Permanent atrial fibrillation     coumadin clinic at [facility]. Has previously required Lovenox->Coumadin bridge.  . Severe mitral stenosis by prior echocardiogram     last echo 07/2012  . Aortic stenosis   . Symptomatic bradycardia     s/p pacer  . Depression   . HSV (herpes simplex virus) infection   . Anemia   . PAD (peripheral artery disease)     a. s/p RLE angioplasty with [name]. b. (by [name])  L popliteral artery orbital atherectomy and balloon angioplasty & L SFA orbital atherectomy and balloon angioplasty 11/30/12.  [name] Hyperparathyroidism, primary     s/p parathyroidectomy   Past Surgical History  Procedure Laterality Date  . Insert / replace / remove pacemaker    . Kidney transplant  December 1999  . Abdominal angiogram  11/30/2012    ABDOMINAL AORTIC ANGIOGRAM   DR ARDIA  . Dialysis fistula creation      Current Outpatient Prescriptions  Medication Sig Dispense Refill  . acetaminophen (TYLENOL) 500 MG tablet Take 500 mg  by mouth every 6 (six) hours as needed. For pain      . amLODipine (NORVASC) 5 MG tablet Take 1 tablet (5 mg total) by mouth daily.  30 tablet  6  . amoxicillin (AMOXIL) 500 MG capsule Take 2,000 mg by mouth once. before dental procedure      . b complex-vitamin c-folic acid (NEPHRO-VITE) 0.8 MG TABS Take 0.8 mg by mouth daily.        . Calcium Carbonate-Vitamin D (CALTRATE 600+D) 600-400 MG-UNIT per tablet Take 1 tablet by mouth 2 (two) times daily.       . cycloSPORINE modified (NEORAL/GENGRAF) 25 MG capsule Take 25 mg by mouth 2 (two) times daily.        . digoxin (LANOXIN) 0.125 MG tablet Take 1 tablet (0.125 mg total) by mouth daily.  90 tablet  3  . enoxaparin (LOVENOX) 40 MG/0.4ML injection Inject 0.4 mLs (40 mg total) into the skin every 12 (twelve) hours.  10 Syringe  1  . ergocalciferol (VITAMIN D2) 50000 UNITS capsule Take 50,000 Units by mouth every 30 (thirty) days. First of the month      . fluticasone (FLONASE) 50 MCG/ACT nasal spray Place 2 sprays into the nose daily.      . furosemide (LASIX) 40 MG tablet Take 40 mg by mouth daily as needed (swelling).       . hydroxypropyl methylcellulose (ISOPTO  TEARS) 2.5 % ophthalmic solution Place 1 drop into both eyes 4 (four) times daily as needed (dry eyes).      . Insulin Isophane & Regular (HUMULIN 70/30 [place]) Inject 10 Units into the skin every morning.      . Iron 66 MG TABS Take 1 tablet by mouth daily.      . metoprolol (LOPRESSOR) 100 MG tablet Take 1 tablet (100 mg total) by mouth 2 (two) times daily.  60 tablet  6  . mycophenolate (CELLCEPT) 250 MG capsule Take 500 mg by mouth 2 (two) times daily.       . predniSONE (DELTASONE) 5 MG tablet Take 5 mg by mouth daily.        . sitaGLIPtin (JANUVIA) 100 MG tablet Take 100 mg by mouth daily.        . valACYclovir (VALTREX) 500 MG tablet Take 500 mg by mouth daily. Monday, Wednesday and Friday       . warfarin (COUMADIN) 2.5 MG tablet Take 1.25-2.5 mg by mouth daily. Take 1.25mg  on  Tuesday and Saturday. Take 2.5mg  the rest of the week       No current facility-administered medications for this visit.    Physical Exam: Filed Vitals:   07/24/13 0809  BP: 136/91  Pulse: 75  Height: 5' (1.524 m)  Weight: 93 lb 12.8 oz (42.547 kg)    GEN- The patient is thin appearing, alert and oriented x 3 today.   Head- normocephalic, atraumatic Eyes-  Sclera clear, conjunctiva pink Ears- hearing intact Oropharynx- clear Lungs- Clear to ausculation bilaterally, normal work of breathing Chest- pacemaker pocket is well healed Heart- irregular rate and rhythm, 2/6 SEM LUSB (early peaking), 3/6 diastolic murmur over the apex GI- soft, NT, ND, + BS Extremities- no clubbing, cyanosis, or edema  Pacemaker interrogation- reviewed in detail today,  See PACEART report  Assessment and Plan:  1. Tachy-brady syndrome Normal pacemaker function See Pace Art report No changes today Pt given information about Wire[employer] program today for Carelink monitor due to no land line phone  2.  Atrial fibrillation - permanent Continue Warfarin long term  3. Mitral stenosis Followed by Dr Excell Seltzer  4. PVD Improved Followed by Dr Kirke Corin  I will see again in a year

## 2013-07-24 NOTE — Patient Instructions (Addendum)
Your physician wants you to follow-up in: 1 year with  Dr Jacquiline Doe will receive a reminder letter in the mail two months in advance. If you don't receive a letter, please call our office to schedule the follow-up appointment.  Remote monitoring is used to monitor your Pacemaker or ICD from home. This monitoring reduces the number of office visits required to check your device to one time per year. It allows [place] to keep an eye on the functioning of your device to ensure it is working properly. You are scheduled for a device check from home on 10/25/13 You may send your transmission at any time that day. If you have a wireless device, the transmission will be sent automatically. After your physician reviews your transmission, you will receive a postcard with your next transmission date.

## 2013-07-25 ENCOUNTER — Encounter: Payer: Medicare Other | Attending: Emergency Medicine | Admitting: Dietician

## 2013-07-25 DIAGNOSIS — R634 Abnormal weight loss: Secondary | ICD-10-CM | POA: Insufficient documentation

## 2013-07-25 DIAGNOSIS — Z713 Dietary counseling and surveillance: Secondary | ICD-10-CM | POA: Insufficient documentation

## 2013-07-27 ENCOUNTER — Encounter: Payer: Self-pay | Admitting: Cardiovascular Disease

## 2013-07-27 NOTE — Telephone Encounter (Signed)
New Problem:  Pt states she is calling in to hear her recent test results.

## 2013-07-27 NOTE — Telephone Encounter (Signed)
This encounter was created in error - please disregard.

## 2013-08-08 ENCOUNTER — Telehealth: Payer: Self-pay | Admitting: Internal Medicine

## 2013-08-08 ENCOUNTER — Ambulatory Visit: Payer: Medicare Other | Admitting: Internal Medicine

## 2013-08-09 ENCOUNTER — Ambulatory Visit (INDEPENDENT_AMBULATORY_CARE_PROVIDER_SITE_OTHER): Payer: Medicare Other | Admitting: Pharmacist

## 2013-08-09 DIAGNOSIS — I4891 Unspecified atrial fibrillation: Secondary | ICD-10-CM

## 2013-08-09 DIAGNOSIS — Z7901 Long term (current) use of anticoagulants: Secondary | ICD-10-CM

## 2013-08-09 LAB — POCT INR: INR: 2.2

## 2013-08-14 ENCOUNTER — Encounter: Payer: Self-pay | Admitting: Internal Medicine

## 2013-08-14 NOTE — Telephone Encounter (Signed)
No charge. 

## 2013-08-16 ENCOUNTER — Ambulatory Visit: Payer: Medicare Other | Admitting: Internal Medicine

## 2013-08-18 ENCOUNTER — Ambulatory Visit (INDEPENDENT_AMBULATORY_CARE_PROVIDER_SITE_OTHER): Payer: Medicare Other | Admitting: General Practice

## 2013-08-18 ENCOUNTER — Ambulatory Visit
Admission: RE | Admit: 2013-08-18 | Discharge: 2013-08-18 | Disposition: A | Discharge status: Home / Self Care | Payer: Medicare Other | Source: Ambulatory Visit

## 2013-08-18 DIAGNOSIS — Z1231 Encounter for screening mammogram for malignant neoplasm of breast: Secondary | ICD-10-CM

## 2013-08-18 DIAGNOSIS — Z7901 Long term (current) use of anticoagulants: Secondary | ICD-10-CM

## 2013-08-18 DIAGNOSIS — I4891 Unspecified atrial fibrillation: Secondary | ICD-10-CM

## 2013-08-28 ENCOUNTER — Encounter: Payer: Self-pay | Admitting: Cardiology

## 2013-08-29 ENCOUNTER — Encounter: Payer: Self-pay | Admitting: Internal Medicine

## 2013-09-06 ENCOUNTER — Ambulatory Visit: Payer: Medicare Other | Admitting: Internal Medicine

## 2013-09-06 ENCOUNTER — Telehealth: Payer: Self-pay | Admitting: Internal Medicine

## 2013-09-08 ENCOUNTER — Ambulatory Visit (INDEPENDENT_AMBULATORY_CARE_PROVIDER_SITE_OTHER): Payer: Medicare Other | Admitting: Pharmacist

## 2013-09-08 DIAGNOSIS — Z7901 Long term (current) use of anticoagulants: Secondary | ICD-10-CM

## 2013-09-08 DIAGNOSIS — I4891 Unspecified atrial fibrillation: Secondary | ICD-10-CM

## 2013-09-08 LAB — POCT INR: INR: 2

## 2013-09-12 ENCOUNTER — Ambulatory Visit: Payer: Medicare Other | Admitting: Cardiovascular Disease

## 2013-09-14 ENCOUNTER — Ambulatory Visit: Payer: Medicare Other | Admitting: Internal Medicine

## 2013-09-22 ENCOUNTER — Other Ambulatory Visit: Payer: Self-pay | Admitting: *Deleted

## 2013-09-22 ENCOUNTER — Other Ambulatory Visit: Payer: Self-pay | Admitting: Internal Medicine

## 2013-10-06 ENCOUNTER — Ambulatory Visit (INDEPENDENT_AMBULATORY_CARE_PROVIDER_SITE_OTHER): Payer: Medicare Other | Admitting: *Deleted

## 2013-10-06 DIAGNOSIS — Z5181 Encounter for therapeutic drug level monitoring: Secondary | ICD-10-CM

## 2013-10-06 DIAGNOSIS — I4891 Unspecified atrial fibrillation: Secondary | ICD-10-CM

## 2013-10-06 DIAGNOSIS — Z7901 Long term (current) use of anticoagulants: Secondary | ICD-10-CM

## 2013-10-06 LAB — POCT INR: INR: 1.9

## 2013-10-25 ENCOUNTER — Encounter: Payer: Medicare Other | Admitting: *Deleted

## 2013-10-27 ENCOUNTER — Ambulatory Visit (INDEPENDENT_AMBULATORY_CARE_PROVIDER_SITE_OTHER): Payer: Medicare Other | Admitting: Pharmacist

## 2013-10-27 DIAGNOSIS — I4891 Unspecified atrial fibrillation: Secondary | ICD-10-CM

## 2013-10-27 DIAGNOSIS — Z7901 Long term (current) use of anticoagulants: Secondary | ICD-10-CM

## 2013-10-27 DIAGNOSIS — Z5181 Encounter for therapeutic drug level monitoring: Secondary | ICD-10-CM

## 2013-10-27 LAB — POCT INR: INR: 2.4

## 2013-10-30 ENCOUNTER — Encounter: Payer: Self-pay | Admitting: *Deleted

## 2013-11-06 NOTE — Telephone Encounter (Signed)
See Previous Encounter

## 2013-11-17 ENCOUNTER — Ambulatory Visit (INDEPENDENT_AMBULATORY_CARE_PROVIDER_SITE_OTHER): Payer: Medicare Other | Admitting: *Deleted

## 2013-11-17 DIAGNOSIS — Z7901 Long term (current) use of anticoagulants: Secondary | ICD-10-CM

## 2013-11-17 DIAGNOSIS — I4891 Unspecified atrial fibrillation: Secondary | ICD-10-CM

## 2013-11-17 DIAGNOSIS — Z5181 Encounter for therapeutic drug level monitoring: Secondary | ICD-10-CM

## 2013-11-17 LAB — POCT INR: INR: 3.2

## 2013-11-21 ENCOUNTER — Ambulatory Visit (INDEPENDENT_AMBULATORY_CARE_PROVIDER_SITE_OTHER): Payer: Medicare Other | Admitting: Cardiovascular Disease

## 2013-11-21 ENCOUNTER — Encounter: Payer: Self-pay | Admitting: Cardiovascular Disease

## 2013-11-21 VITALS — BP 120/80 | HR 70 | Wt 94.0 lb

## 2013-11-21 DIAGNOSIS — I739 Peripheral vascular disease, unspecified: Secondary | ICD-10-CM

## 2013-11-21 NOTE — Patient Instructions (Signed)
Your physician has requested that you have a lower arterial duplex. This test is an ultrasound of the arteries in the legs. It looks at arterial blood flow in the legs. Allow one hour for Lower  Arterial scans. There are no restrictions or special instructions  Your physician wants you to follow-up in: 6 MONTHS WITH DR ARIDA.   You will receive a reminder letter in the mail two months in advance. If you don't receive a letter, please call our office to schedule the follow-up appointment.  Your physician recommends that you continue on your current medications as directed. Please refer to the Current Medication list given to you today.

## 2013-11-21 NOTE — Progress Notes (Signed)
Primary cardiologist: Dr. Excell Seltzer  HPI:  This is a pleasant 68 year old woman returning for a followup visit regarding peripheral arterial disease.  She has known history of permanent atrial fibrillation, mitral stenosis, and lower extremity peripheral arterial disease. She was seen last year for severe left calf claudication associated with ulceration involving the left foot.  She underwent angiography in 11/2012 which confirmed severe calcified disease in the left popliteal artery as well as the SFA. She underwent a complex procedure with atherectomy of the left popliteal artery and the left SFA. There was also one-vessel runoff below the knee with only patent anterior tibial artery which had diffuse 60% disease. She has been doing well since then . No recurrent ulceration. No claudications. Most recent noninvasive evaluation in October of 2014 showed patent left SFA and popliteal arteries. Toe pressure was significantly low on the right side. He denies discomfort.  Outpatient Encounter Prescriptions as of 11/21/2013  Medication Sig  . acetaminophen (TYLENOL) 500 MG tablet Take 500 mg by mouth every 6 (six) hours as needed. For pain  . amLODipine (NORVASC) 5 MG tablet Take 1 tablet (5 mg total) by mouth daily.  [name] amoxicillin (AMOXIL) 500 MG capsule Take 2,000 mg by mouth once. before dental procedure  . b complex-vitamin c-folic acid (NEPHRO-VITE) 0.8 MG TABS Take 0.8 mg by mouth daily.    . Calcium Carbonate-Vitamin D (CALTRATE 600+D) 600-400 MG-UNIT per tablet Take 1 tablet by mouth 2 (two) times daily.   . cycloSPORINE modified (NEORAL/GENGRAF) 25 MG capsule Take 25 mg by mouth 2 (two) times daily.    . digoxin (LANOXIN) 0.125 MG tablet Take 1 tablet (0.125 mg total) by mouth daily.  . ergocalciferol (VITAMIN D2) 50000 UNITS capsule Take 50,000 Units by mouth every 30 (thirty) days. First of the month  . fluticasone (FLONASE) 50 MCG/ACT nasal spray Place 2 sprays into the nose daily.  .  furosemide (LASIX) 40 MG tablet Take 40 mg by mouth daily as needed (swelling).   . hydroxypropyl methylcellulose (ISOPTO TEARS) 2.5 % ophthalmic solution Place 1 drop into both eyes 4 (four) times daily as needed (dry eyes).  . Insulin Isophane & Regular (HUMULIN 70/30 [place]) Inject 10 Units into the skin every morning.  . Iron 66 MG TABS Take 1 tablet by mouth daily.  . metoprolol (LOPRESSOR) 100 MG tablet Take 1 tablet (100 mg total) by mouth 2 (two) times daily.  . mycophenolate (CELLCEPT) 250 MG capsule Take 500 mg by mouth 2 (two) times daily.   . predniSONE (DELTASONE) 5 MG tablet Take 5 mg by mouth daily.    . sitaGLIPtin (JANUVIA) 100 MG tablet Take 100 mg by mouth daily.    . valACYclovir (VALTREX) 500 MG tablet Take 500 mg by mouth daily. Monday, Wednesday and Friday   . warfarin (COUMADIN) 2.5 MG tablet TAKE AS DIRECTED BY ANTICOAGULATION CLINIC    Allergies  Allergen Reactions  . Bactrim [Sulfamethoxazole-Trimethoprim] Itching  . Codeine Hives and Nausea Only  . Latex   . Penicillins Nausea And Vomiting    Past Medical History  Diagnosis Date  . Diabetes mellitus   . Hypertension   . Chronic kidney disease     s/p renal transplant;  follows with [name]  . Stroke December 2011    left MCA distribution  . Permanent atrial fibrillation     coumadin clinic at [facility]. Has previously required Lovenox->Coumadin bridge.  . Severe mitral stenosis by prior echocardiogram  last echo 07/2012  . Aortic stenosis   . Symptomatic bradycardia     s/p pacer  . Depression   . HSV (herpes simplex virus) infection   . Anemia   . PAD (peripheral artery disease)     a. s/p RLE angioplasty with Dr. Fredia SorrowYamagata. b. (by Dr. Kirke CorinArida)  L popliteral artery orbital atherectomy and balloon angioplasty & L SFA orbital atherectomy and balloon angioplasty 11/30/12.  [name]. Hyperparathyroidism, primary     s/p parathyroidectomy    ROS: Negative except as per HPI  BP 120/80  Pulse  70  Wt 94 lb (42.638 kg)  PHYSICAL EXAM: Pt is alert and oriented, NAD HEENT: normal Neck: JVP - normal, carotids 2+= without bruits Lungs: CTA bilaterally CV: Irregular with grade 2/6 holosystolic murmur at the apex Abd: soft, NT, Positive BS, no hepatomegaly Ext: There is no edema. Femoral pulses are 2+ bilaterally. Pedal pulses are nonpalpable bilaterally. The left foot shows a healing ulcer on the heel. The toes are longer tender.  ASSESSMENT AND PLAN: 1. Lower extremity peripheral arterial disease with minor tissue loss affecting the left lower. Status post atherectomy of the left popliteal artery and SFA with no further ulceration or claudication. Continue medical therapy. Repeat no extremity arterial duplex in 6 months.  2. Mitral stenosis. The patient remains minimally symptomatic. Will continue clinical followup.  3. Permanent atrial fibrillation. She remains well rate controlled. She is anticoagulated with warfarin.

## 2013-12-11 ENCOUNTER — Ambulatory Visit (INDEPENDENT_AMBULATORY_CARE_PROVIDER_SITE_OTHER): Payer: Medicare Other

## 2013-12-11 DIAGNOSIS — I4891 Unspecified atrial fibrillation: Secondary | ICD-10-CM

## 2013-12-11 DIAGNOSIS — Z5181 Encounter for therapeutic drug level monitoring: Secondary | ICD-10-CM

## 2013-12-11 LAB — POCT INR: INR: 4.3

## 2013-12-18 ENCOUNTER — Other Ambulatory Visit: Payer: Self-pay | Admitting: Internal Medicine

## 2013-12-26 ENCOUNTER — Ambulatory Visit (INDEPENDENT_AMBULATORY_CARE_PROVIDER_SITE_OTHER): Payer: Medicare Other | Admitting: *Deleted

## 2013-12-26 DIAGNOSIS — I4891 Unspecified atrial fibrillation: Secondary | ICD-10-CM

## 2013-12-26 DIAGNOSIS — Z5181 Encounter for therapeutic drug level monitoring: Secondary | ICD-10-CM

## 2013-12-26 LAB — POCT INR: INR: 2.1

## 2013-12-28 ENCOUNTER — Other Ambulatory Visit: Payer: Self-pay | Admitting: Cardiovascular Disease

## 2014-01-09 ENCOUNTER — Ambulatory Visit (INDEPENDENT_AMBULATORY_CARE_PROVIDER_SITE_OTHER): Payer: Medicare Other | Admitting: *Deleted

## 2014-01-09 DIAGNOSIS — I4891 Unspecified atrial fibrillation: Secondary | ICD-10-CM

## 2014-01-09 DIAGNOSIS — Z5181 Encounter for therapeutic drug level monitoring: Secondary | ICD-10-CM

## 2014-01-09 LAB — POCT INR: INR: 3.6

## 2014-01-23 ENCOUNTER — Ambulatory Visit (INDEPENDENT_AMBULATORY_CARE_PROVIDER_SITE_OTHER): Payer: Medicare Other

## 2014-01-23 DIAGNOSIS — I4891 Unspecified atrial fibrillation: Secondary | ICD-10-CM

## 2014-01-23 DIAGNOSIS — Z5181 Encounter for therapeutic drug level monitoring: Secondary | ICD-10-CM

## 2014-01-23 LAB — POCT INR: INR: 1.8

## 2014-01-30 ENCOUNTER — Other Ambulatory Visit: Payer: Self-pay | Admitting: Cardiovascular Disease

## 2014-02-02 ENCOUNTER — Other Ambulatory Visit: Payer: Self-pay | Admitting: Nephrology

## 2014-02-02 DIAGNOSIS — M810 Age-related osteoporosis without current pathological fracture: Secondary | ICD-10-CM

## 2014-02-07 ENCOUNTER — Encounter: Payer: Self-pay | Admitting: Cardiology

## 2014-02-09 ENCOUNTER — Ambulatory Visit (INDEPENDENT_AMBULATORY_CARE_PROVIDER_SITE_OTHER): Payer: Medicare Other

## 2014-02-09 DIAGNOSIS — Z5181 Encounter for therapeutic drug level monitoring: Secondary | ICD-10-CM

## 2014-02-09 DIAGNOSIS — I4891 Unspecified atrial fibrillation: Secondary | ICD-10-CM

## 2014-02-09 LAB — POCT INR: INR: 2

## 2014-02-16 ENCOUNTER — Other Ambulatory Visit: Payer: Medicare Other

## 2014-02-19 ENCOUNTER — Other Ambulatory Visit: Payer: Self-pay | Admitting: Internal Medicine

## 2014-02-19 ENCOUNTER — Telehealth: Payer: Self-pay | Admitting: Internal Medicine

## 2014-02-19 NOTE — Telephone Encounter (Signed)
New Prob   Having to switch generic manufacture for Warfarin. Calling to verify this is ok. Please call.

## 2014-02-19 NOTE — Telephone Encounter (Signed)
Pharmacy notified this will be okay to fill.

## 2014-02-23 ENCOUNTER — Ambulatory Visit
Admission: RE | Admit: 2014-02-23 | Discharge: 2014-02-23 | Disposition: A | Discharge status: Home / Self Care | Payer: Medicare Other | Source: Ambulatory Visit | Attending: Nephrology | Admitting: Nephrology

## 2014-02-23 DIAGNOSIS — M810 Age-related osteoporosis without current pathological fracture: Secondary | ICD-10-CM

## 2014-03-10 ENCOUNTER — Other Ambulatory Visit: Payer: Self-pay | Admitting: Cardiovascular Disease

## 2014-03-12 ENCOUNTER — Ambulatory Visit (INDEPENDENT_AMBULATORY_CARE_PROVIDER_SITE_OTHER): Payer: Medicare Other | Admitting: *Deleted

## 2014-03-12 DIAGNOSIS — I4891 Unspecified atrial fibrillation: Secondary | ICD-10-CM

## 2014-03-12 DIAGNOSIS — Z5181 Encounter for therapeutic drug level monitoring: Secondary | ICD-10-CM

## 2014-03-12 LAB — POCT INR: INR: 2.5

## 2014-03-19 ENCOUNTER — Ambulatory Visit (INDEPENDENT_AMBULATORY_CARE_PROVIDER_SITE_OTHER): Payer: Medicare Other | Admitting: *Deleted

## 2014-03-19 ENCOUNTER — Encounter: Payer: Self-pay | Admitting: Internal Medicine

## 2014-03-19 DIAGNOSIS — I495 Sick sinus syndrome: Secondary | ICD-10-CM

## 2014-03-19 DIAGNOSIS — I4891 Unspecified atrial fibrillation: Secondary | ICD-10-CM

## 2014-03-20 NOTE — Progress Notes (Signed)
Remote pacemaker transmission.   

## 2014-03-21 ENCOUNTER — Ambulatory Visit: Payer: Medicare Other | Admitting: Internal Medicine

## 2014-03-22 LAB — MDC_IDC_ENUM_SESS_TYPE_REMOTE
Battery Remaining Longevity: 82 mo
Battery Voltage: 2.77 V
Brady Statistic RV Percent Paced: 40 %
Lead Channel Impedance Value: 0 Ohm
Lead Channel Pacing Threshold Amplitude: 0.375 V
Lead Channel Sensing Intrinsic Amplitude: 11.2 mV
Lead Channel Setting Pacing Amplitude: 2.5 V
Lead Channel Setting Pacing Pulse Width: 0.4 ms
Lead Channel Setting Sensing Sensitivity: 4 mV
MDC IDC MSMT BATTERY IMPEDANCE: 594 Ohm
MDC IDC MSMT LEADCHNL RV IMPEDANCE VALUE: 572 Ohm
MDC IDC MSMT LEADCHNL RV PACING THRESHOLD PULSEWIDTH: 0.4 ms
MDC IDC SESS DTM: 20150713150339

## 2014-03-23 ENCOUNTER — Other Ambulatory Visit: Payer: Self-pay | Admitting: Internal Medicine

## 2014-03-28 ENCOUNTER — Encounter: Payer: Self-pay | Admitting: Cardiology

## 2014-03-30 ENCOUNTER — Ambulatory Visit (INDEPENDENT_AMBULATORY_CARE_PROVIDER_SITE_OTHER): Payer: Medicare Other | Admitting: *Deleted

## 2014-03-30 ENCOUNTER — Telehealth: Payer: Self-pay | Admitting: Internal Medicine

## 2014-03-30 DIAGNOSIS — I4891 Unspecified atrial fibrillation: Secondary | ICD-10-CM

## 2014-03-30 DIAGNOSIS — I4819 Other persistent atrial fibrillation: Secondary | ICD-10-CM

## 2014-03-30 DIAGNOSIS — I495 Sick sinus syndrome: Secondary | ICD-10-CM

## 2014-03-30 LAB — MDC_IDC_ENUM_SESS_TYPE_REMOTE
Battery Impedance: 619 Ohm
Battery Remaining Longevity: 81 mo
Brady Statistic RV Percent Paced: 40 %
Date Time Interrogation Session: 20150724192438
Lead Channel Impedance Value: 553 Ohm
Lead Channel Sensing Intrinsic Amplitude: 11.2 mV
Lead Channel Setting Pacing Amplitude: 2.5 V
Lead Channel Setting Pacing Pulse Width: 0.4 ms
MDC IDC MSMT BATTERY VOLTAGE: 2.77 V
MDC IDC MSMT LEADCHNL RA IMPEDANCE VALUE: 0 Ohm
MDC IDC MSMT LEADCHNL RV PACING THRESHOLD AMPLITUDE: 0.375 V
MDC IDC MSMT LEADCHNL RV PACING THRESHOLD PULSEWIDTH: 0.4 ms
MDC IDC SET LEADCHNL RV SENSING SENSITIVITY: 4 mV

## 2014-03-30 NOTE — Telephone Encounter (Signed)
New message ° ° ° ° ° °Did we get her remote transmission? °

## 2014-04-02 NOTE — Progress Notes (Signed)
Remote pacemaker transmission.   

## 2014-04-10 ENCOUNTER — Ambulatory Visit (INDEPENDENT_AMBULATORY_CARE_PROVIDER_SITE_OTHER): Payer: Medicare Other | Admitting: Pharmacist Clinician (PhC)/ Clinical Pharmacy Specialist

## 2014-04-10 DIAGNOSIS — Z5181 Encounter for therapeutic drug level monitoring: Secondary | ICD-10-CM

## 2014-04-10 DIAGNOSIS — I4891 Unspecified atrial fibrillation: Secondary | ICD-10-CM

## 2014-04-10 LAB — POCT INR: INR: 2.7

## 2014-04-12 ENCOUNTER — Encounter: Payer: Self-pay | Admitting: Cardiology

## 2014-05-01 ENCOUNTER — Encounter: Payer: Self-pay | Admitting: Internal Medicine

## 2014-05-02 ENCOUNTER — Ambulatory Visit (INDEPENDENT_AMBULATORY_CARE_PROVIDER_SITE_OTHER): Payer: Medicare Other | Admitting: Cardiovascular Disease

## 2014-05-02 ENCOUNTER — Ambulatory Visit (INDEPENDENT_AMBULATORY_CARE_PROVIDER_SITE_OTHER): Payer: Medicare Other | Admitting: Pharmacist

## 2014-05-02 ENCOUNTER — Encounter: Payer: Self-pay | Admitting: Cardiovascular Disease

## 2014-05-02 ENCOUNTER — Other Ambulatory Visit: Payer: Self-pay | Admitting: Cardiovascular Disease

## 2014-05-02 VITALS — BP 136/87 | HR 65 | Ht 60.0 in | Wt 93.0 lb

## 2014-05-02 DIAGNOSIS — Z5181 Encounter for therapeutic drug level monitoring: Secondary | ICD-10-CM

## 2014-05-02 DIAGNOSIS — I050 Rheumatic mitral stenosis: Secondary | ICD-10-CM

## 2014-05-02 DIAGNOSIS — I4891 Unspecified atrial fibrillation: Secondary | ICD-10-CM

## 2014-05-02 LAB — POCT INR: INR: 2.4

## 2014-05-02 NOTE — Progress Notes (Signed)
HPI:  68 year old woman presenting for followup evaluation. The patient is followed for permanent atrial fibrillation, mitral stenosis, and lower extremity peripheral arterial disease. Last year she underwent atherectomy of the left SFA and popliteal artery after presenting with symptoms of critical limb ischemia. [name] performed her procedure. She was bridged with Lovenox because of history of TIA in the setting of chronic atrial fibrillation and mitral stenosis.   The patient continues to do remarkably well. She's had no recurrence of claudication symptoms since her atherectomy procedure. She walks every day for exercise. She denies chest pain, chest pressure, shortness of breath, leg swelling, orthopnea, or PND. She's had no palpitations, lightheadedness, or syncope. She is tolerating long-term anticoagulation with warfarin and denies any bleeding problems.  Outpatient Encounter Prescriptions as of 05/02/2014  Medication Sig  . acetaminophen (TYLENOL) 500 MG tablet Take 500 mg by mouth every 6 (six) hours as needed. For pain  . amLODipine (NORVASC) 5 MG tablet TAKE 1 TABLET EVERY DAY ** PATIENT NEEDS TO MAKE APPT WITH DR. Excell Seltzer**  . amoxicillin (AMOXIL) 500 MG capsule Take 2,000 mg by mouth once. before dental procedure  . b complex-vitamin c-folic acid (NEPHRO-VITE) 0.8 MG TABS Take 0.8 mg by mouth daily.    . Calcium Carbonate-Vitamin D (CALTRATE 600+D) 600-400 MG-UNIT per tablet Take 1 tablet by mouth 2 (two) times daily.   . cycloSPORINE modified (NEORAL/GENGRAF) 25 MG capsule Take 25 mg by mouth 2 (two) times daily.    . digoxin (LANOXIN) 0.125 MG tablet TAKE 1 TABLET DAILY  . ergocalciferol (VITAMIN D2) 50000 UNITS capsule Take 50,000 Units by mouth every 30 (thirty) days. First of the month  . fluticasone (FLONASE) 50 MCG/ACT nasal spray Place 2 sprays into the nose daily.  . furosemide (LASIX) 40 MG tablet Take 40 mg by mouth daily as needed (swelling).   . hydroxypropyl  methylcellulose (ISOPTO TEARS) 2.5 % ophthalmic solution Place 1 drop into both eyes 4 (four) times daily as needed (dry eyes).  . Insulin Isophane & Regular (HUMULIN 70/30 [place]) Inject 10 Units into the skin every morning.  . Iron 66 MG TABS Take 1 tablet by mouth daily.  . metoprolol (LOPRESSOR) 100 MG tablet TAKE 1 TABLET TWICE A DAY **PATIENT NEEDS TO MAKE APPT WITH DR. Excell Seltzer**  . mycophenolate (CELLCEPT) 250 MG capsule Take 500 mg by mouth 2 (two) times daily.   . predniSONE (DELTASONE) 5 MG tablet Take 5 mg by mouth daily.    . sitaGLIPtin (JANUVIA) 100 MG tablet Take 100 mg by mouth daily.    . valACYclovir (VALTREX) 500 MG tablet Take 500 mg by mouth daily. Monday, Wednesday and Friday   . warfarin (COUMADIN) 2.5 MG tablet TAKE AS DIRECTED BY ANTICOAGULATION CLINIC    Allergies  Allergen Reactions  . Bactrim [Sulfamethoxazole-Trimethoprim] Itching  . Codeine Hives and Nausea Only  . Latex   . Penicillins Nausea And Vomiting    Past Medical History  Diagnosis Date  . Diabetes mellitus   . Hypertension   . Chronic kidney disease     s/p renal transplant;  follows with [name]  . Stroke December 2011    left MCA distribution  . Permanent atrial fibrillation     coumadin clinic at [facility]. Has previously required Lovenox->Coumadin bridge.  . Severe mitral stenosis by prior echocardiogram     last echo 07/2012  . Aortic stenosis   . Symptomatic bradycardia     s/p pacer  . Depression   .  HSV (herpes simplex virus) infection   . Anemia   . PAD (peripheral artery disease)     a. s/p RLE angioplasty with [name]. b. (by [name])  L popliteral artery orbital atherectomy and balloon angioplasty & L SFA orbital atherectomy and balloon angioplasty 11/30/12.  [name] Hyperparathyroidism, primary     s/p parathyroidectomy   ROS: Negative except as per HPI  BP 136/87  Pulse 65  Ht 5' (1.524 m)  Wt 93 lb (42.185 kg)  BMI 18.16 kg/m2  PHYSICAL EXAM: Pt is  alert and oriented, small thin woman in NAD HEENT: normal Neck: JVP - normal Lungs: CTA bilaterally CV: Irregular with grade 2/6 early systolic murmur at the left sternal border Abd: soft, NT, Positive BS, no hepatomegaly Ext: no C/C/E Skin: warm/dry no rash  EKG:  Atrial fibrillation with demand ventricular pacing, heart rate 65 beats per minute. ST and T wave abnormality consider inferior and anterolateral ischemia versus dig effect  ASSESSMENT AND PLAN: 1. Severe mitral stenosis. The patient continues to have a good functional capacity and no symptoms related to this. Will continue clinical followup and I would like to repeat an echocardiogram prior to her 6 month return visit.  2. Permanent atrial fibrillation. She is tolerating long-term anticoagulation without bleeding problems. Heart rate is well controlled. She has undergone permanent pacemaker placement.  3. Lower extremity peripheral arterial disease with history of critical limb ischemia. She is markedly improved after atherectomy by [name]. She is scheduled for followup Doppler studies in October.  [name] MD 05/02/2014 3:12 PM

## 2014-05-02 NOTE — Patient Instructions (Signed)
Your physician wants you to follow-up in: 6 MONTHS with Dr Cooper.  You will receive a reminder letter in the mail two months in advance. If you don't receive a letter, please call our office to schedule the follow-up appointment.  Your physician has requested that you have an echocardiogram in 6 MONTHS. Echocardiography is a painless test that uses sound waves to create images of your heart. It provides your doctor with information about the size and shape of your heart and how well your heart's chambers and valves are working. This procedure takes approximately one hour. There are no restrictions for this procedure.  Your physician recommends that you continue on your current medications as directed. Please refer to the Current Medication list given to you today.   

## 2014-05-24 ENCOUNTER — Other Ambulatory Visit: Payer: Self-pay | Admitting: Internal Medicine

## 2014-06-13 ENCOUNTER — Ambulatory Visit (INDEPENDENT_AMBULATORY_CARE_PROVIDER_SITE_OTHER): Payer: Medicare Other | Admitting: *Deleted

## 2014-06-13 DIAGNOSIS — I4891 Unspecified atrial fibrillation: Secondary | ICD-10-CM

## 2014-06-13 DIAGNOSIS — Z5181 Encounter for therapeutic drug level monitoring: Secondary | ICD-10-CM

## 2014-06-13 LAB — POCT INR: INR: 1.9

## 2014-06-20 ENCOUNTER — Other Ambulatory Visit: Payer: Self-pay | Admitting: Internal Medicine

## 2014-06-22 ENCOUNTER — Encounter (HOSPITAL_COMMUNITY): Payer: Medicare Other

## 2014-07-04 ENCOUNTER — Other Ambulatory Visit: Payer: Self-pay | Admitting: Internal Medicine

## 2014-07-13 ENCOUNTER — Ambulatory Visit (INDEPENDENT_AMBULATORY_CARE_PROVIDER_SITE_OTHER): Payer: Medicare Other | Admitting: *Deleted

## 2014-07-13 DIAGNOSIS — Z5181 Encounter for therapeutic drug level monitoring: Secondary | ICD-10-CM

## 2014-07-13 DIAGNOSIS — I4891 Unspecified atrial fibrillation: Secondary | ICD-10-CM

## 2014-07-13 LAB — POCT INR: INR: 2.7

## 2014-07-19 ENCOUNTER — Other Ambulatory Visit: Payer: Self-pay

## 2014-07-19 DIAGNOSIS — Z1231 Encounter for screening mammogram for malignant neoplasm of breast: Secondary | ICD-10-CM

## 2014-07-23 ENCOUNTER — Telehealth: Payer: Self-pay | Admitting: Cardiovascular Disease

## 2014-07-23 NOTE — Telephone Encounter (Signed)
Telephone pt and spoke with her in regards to ABX that she will start and ABX and steroids she received in office. Due to starting Bactrim DS pt will come in for INR on Friday.

## 2014-07-23 NOTE — Telephone Encounter (Signed)
New message           Pt says that she was told she has an upper respiratory infection / she has been given a shot of Rocephin 1mg  and Depomedrol 80mg  (steroid) / Sulfamethoxazole tablets are at the drug store pending approval by the coumadin clinic / please give pt a call

## 2014-07-27 ENCOUNTER — Ambulatory Visit (INDEPENDENT_AMBULATORY_CARE_PROVIDER_SITE_OTHER): Payer: Medicare Other

## 2014-07-27 DIAGNOSIS — Z5181 Encounter for therapeutic drug level monitoring: Secondary | ICD-10-CM

## 2014-07-27 DIAGNOSIS — I4891 Unspecified atrial fibrillation: Secondary | ICD-10-CM

## 2014-07-27 LAB — POCT INR: INR: 2.2

## 2014-08-01 ENCOUNTER — Encounter: Payer: Self-pay | Admitting: *Deleted

## 2014-08-10 ENCOUNTER — Ambulatory Visit (INDEPENDENT_AMBULATORY_CARE_PROVIDER_SITE_OTHER): Payer: Medicare Other | Admitting: *Deleted

## 2014-08-10 DIAGNOSIS — Z5181 Encounter for therapeutic drug level monitoring: Secondary | ICD-10-CM

## 2014-08-10 DIAGNOSIS — I4891 Unspecified atrial fibrillation: Secondary | ICD-10-CM

## 2014-08-10 LAB — POCT INR: INR: 2.4

## 2014-08-16 ENCOUNTER — Encounter (HOSPITAL_COMMUNITY): Payer: Self-pay | Admitting: Cardiovascular Disease

## 2014-08-20 ENCOUNTER — Ambulatory Visit
Admission: RE | Admit: 2014-08-20 | Discharge: 2014-08-20 | Disposition: A | Discharge status: Home / Self Care | Payer: Medicare Other | Source: Ambulatory Visit

## 2014-08-20 DIAGNOSIS — Z1231 Encounter for screening mammogram for malignant neoplasm of breast: Secondary | ICD-10-CM

## 2014-09-06 ENCOUNTER — Ambulatory Visit (INDEPENDENT_AMBULATORY_CARE_PROVIDER_SITE_OTHER): Payer: Medicare Other | Admitting: Pharmacist

## 2014-09-06 DIAGNOSIS — Z5181 Encounter for therapeutic drug level monitoring: Secondary | ICD-10-CM

## 2014-09-06 DIAGNOSIS — I4891 Unspecified atrial fibrillation: Secondary | ICD-10-CM

## 2014-09-06 LAB — POCT INR: INR: 2.6

## 2014-09-21 ENCOUNTER — Other Ambulatory Visit: Payer: Self-pay | Admitting: Internal Medicine

## 2014-10-10 ENCOUNTER — Ambulatory Visit (INDEPENDENT_AMBULATORY_CARE_PROVIDER_SITE_OTHER): Payer: Medicare Other | Admitting: *Deleted

## 2014-10-10 DIAGNOSIS — Z5181 Encounter for therapeutic drug level monitoring: Secondary | ICD-10-CM

## 2014-10-10 DIAGNOSIS — I4891 Unspecified atrial fibrillation: Secondary | ICD-10-CM

## 2014-10-10 LAB — POCT INR: INR: 3.8

## 2014-10-16 ENCOUNTER — Encounter: Payer: Self-pay | Admitting: *Deleted

## 2014-10-16 ENCOUNTER — Telehealth: Payer: Self-pay | Admitting: Internal Medicine

## 2014-10-16 NOTE — Telephone Encounter (Signed)
2/9 left vm to sched recall w/ [facility]; ah

## 2014-10-18 ENCOUNTER — Other Ambulatory Visit: Payer: Self-pay | Admitting: Internal Medicine

## 2014-10-25 ENCOUNTER — Telehealth: Payer: Self-pay | Admitting: Cardiovascular Disease

## 2014-10-25 NOTE — Telephone Encounter (Signed)
cxl coumadin appt due to being on new med, Cefuroxime Axetil 500mg  bid, aldo running  A fever, she wants to know if this med will interact with her coumadin, pls call [phone] or [phone]

## 2014-10-25 NOTE — Telephone Encounter (Signed)
Returned call to pt, advised Ceftin abx does not adversely effect Coumadin.  No need to adjust Coumadin dosage while on abx.  Rescheduled pt's f/u appt to 10/29/14.  Pt verbalized understanding.

## 2014-10-30 ENCOUNTER — Ambulatory Visit (INDEPENDENT_AMBULATORY_CARE_PROVIDER_SITE_OTHER): Payer: Medicare Other | Admitting: *Deleted

## 2014-10-30 DIAGNOSIS — Z5181 Encounter for therapeutic drug level monitoring: Secondary | ICD-10-CM

## 2014-10-30 DIAGNOSIS — I4891 Unspecified atrial fibrillation: Secondary | ICD-10-CM

## 2014-10-30 LAB — POCT INR: INR: 2.2

## 2014-11-01 ENCOUNTER — Other Ambulatory Visit: Payer: Self-pay | Admitting: Cardiovascular Disease

## 2014-11-09 ENCOUNTER — Other Ambulatory Visit (HOSPITAL_COMMUNITY): Payer: Medicare Other

## 2014-11-09 ENCOUNTER — Ambulatory Visit: Payer: Medicare Other | Admitting: Cardiovascular Disease

## 2014-11-12 ENCOUNTER — Other Ambulatory Visit (HOSPITAL_COMMUNITY): Payer: Medicare Other

## 2014-11-13 ENCOUNTER — Encounter: Payer: Self-pay | Admitting: *Deleted

## 2014-11-21 ENCOUNTER — Ambulatory Visit (INDEPENDENT_AMBULATORY_CARE_PROVIDER_SITE_OTHER): Payer: Medicare Other | Admitting: *Deleted

## 2014-11-21 DIAGNOSIS — I4891 Unspecified atrial fibrillation: Secondary | ICD-10-CM

## 2014-11-21 DIAGNOSIS — Z5181 Encounter for therapeutic drug level monitoring: Secondary | ICD-10-CM

## 2014-11-21 LAB — POCT INR: INR: 2.8

## 2014-11-28 ENCOUNTER — Other Ambulatory Visit: Payer: Self-pay | Admitting: Internal Medicine

## 2014-12-05 ENCOUNTER — Encounter: Payer: Self-pay | Admitting: Cardiovascular Disease

## 2014-12-05 ENCOUNTER — Other Ambulatory Visit: Payer: Self-pay | Admitting: Internal Medicine

## 2014-12-05 ENCOUNTER — Ambulatory Visit (HOSPITAL_COMMUNITY): Payer: Medicare Other | Attending: Cardiovascular Disease | Admitting: Cardiology

## 2014-12-05 ENCOUNTER — Ambulatory Visit (INDEPENDENT_AMBULATORY_CARE_PROVIDER_SITE_OTHER): Payer: Medicare Other | Admitting: Cardiovascular Disease

## 2014-12-05 ENCOUNTER — Other Ambulatory Visit (HOSPITAL_COMMUNITY): Payer: Self-pay | Admitting: Cardiovascular Disease

## 2014-12-05 VITALS — BP 110/70 | HR 61 | Ht 60.0 in | Wt 88.0 lb

## 2014-12-05 DIAGNOSIS — I050 Rheumatic mitral stenosis: Secondary | ICD-10-CM

## 2014-12-05 DIAGNOSIS — I481 Persistent atrial fibrillation: Secondary | ICD-10-CM

## 2014-12-05 DIAGNOSIS — I4819 Other persistent atrial fibrillation: Secondary | ICD-10-CM

## 2014-12-05 MED ORDER — FUROSEMIDE 20 MG PO TABS: 20.0000 mg | ORAL_TABLET | ORAL | Status: DC

## 2014-12-05 NOTE — Progress Notes (Signed)
Cardiology Office Note   Date:  12/05/2014   ID:  Shamiya, Demeritt 08-07-1946, MRN [id number]  PCP:  Shelba Flake, MD  Cardiologist:  [name], MD    Chief Complaint  Patient presents with  . Atrial Fibrillation     History of Present Illness: [name] [name] is a 69 y.o. female who presents for follow-up of permanent atrial fibrillation, mitral stenosis, and lower extremity peripheral arterial disease. In 2014 she underwent atherectomy of the left SFA and popliteal artery after presenting with symptoms of critical limb ischemia. [name] performed her procedure. She was bridged with Lovenox because of history of TIA in the setting of chronic atrial fibrillation and mitral stenosis.   She reports a lot of problems with sinuses and ear-ringing over the past year. She's had several rounds of antibiotics and hasn't seen much improvement. From a cardiac perspective she is doing ok. Denies chest pain or pressure. Denies shortness of breath with walking. However, she does report episodes of 'fluid overload' when she experiences orthopnea, PND, and shortness of breath. She then takes furosemide and symptoms resolve quickly. She might take lasix once/month.   Past Medical History  Diagnosis Date  . Diabetes mellitus   . Hypertension   . Chronic kidney disease     s/p renal transplant;  follows with [name]  . Stroke December 2011    left MCA distribution  . Permanent atrial fibrillation     coumadin clinic at [facility]. Has previously required Lovenox->Coumadin bridge.  . Severe mitral stenosis by prior echocardiogram     last echo 07/2012  . Aortic stenosis   . Symptomatic bradycardia     s/p pacer  . Depression   . HSV (herpes simplex virus) infection   . Anemia   . PAD (peripheral artery disease)     a. s/p RLE angioplasty with [name]. b. (by [name])  L popliteral artery orbital atherectomy and balloon angioplasty & L SFA orbital  atherectomy and balloon angioplasty 11/30/12.  [name] Hyperparathyroidism, primary     s/p parathyroidectomy    Past Surgical History  Procedure Laterality Date  . Insert / replace / remove pacemaker    . Kidney transplant  December 1999  . Abdominal angiogram  11/30/2012    ABDOMINAL AORTIC ANGIOGRAM   DR ARDIA  . Dialysis fistula creation    . Abdominal aortagram N/A 11/30/2012    Procedure: ABDOMINAL [name];  Surgeon: [name], MD;  Location: [facility] CATH LAB;  Service: Cardiovascular;  Laterality: N/A;    Current Outpatient Prescriptions  Medication Sig Dispense Refill  . acetaminophen (TYLENOL) 500 MG tablet Take 500 mg by mouth every 6 (six) hours as needed. For pain    . amLODipine (NORVASC) 5 MG tablet TAKE 1 TABLET BY MOUTH EVERY DAY 30 tablet 5  . amoxicillin (AMOXIL) 500 MG capsule Take 2,000 mg by mouth once. before dental procedure    . b complex-vitamin c-folic acid (NEPHRO-VITE) 0.8 MG TABS Take 0.8 mg by mouth daily.      . Calcium Carbonate-Vitamin D (CALTRATE 600+D) 600-400 MG-UNIT per tablet Take 1 tablet by mouth 2 (two) times daily.     . cycloSPORINE modified (NEORAL/GENGRAF) 25 MG capsule Take 25 mg by mouth 2 (two) times daily.      . digoxin (LANOXIN) 0.125 MG tablet TAKE 1 TABLET BY MOUTH DAILY 30 tablet 3  . ergocalciferol (VITAMIN D2) 50000 UNITS capsule Take 50,000 Units by mouth every  30 (thirty) days. First of the month    . fluticasone (FLONASE) 50 MCG/ACT nasal spray Place 2 sprays into the nose daily.    . furosemide (LASIX) 20 MG tablet Take 1 tablet (20 mg total) by mouth every other day. 30 tablet 6  . hydroxypropyl methylcellulose (ISOPTO TEARS) 2.5 % ophthalmic solution Place 1 drop into both eyes 4 (four) times daily as needed (dry eyes).    . Insulin Isophane & Regular (HUMULIN 70/30 [place]) Inject 10 Units into the skin every morning.    . Iron 66 MG TABS Take 1 tablet by mouth daily.    . metoprolol (LOPRESSOR) 100 MG tablet TAKE 1 TABLET BY MOUTH  TWICE A DAY 60 tablet 5  . mycophenolate (CELLCEPT) 250 MG capsule Take 500 mg by mouth 2 (two) times daily.     . predniSONE (DELTASONE) 5 MG tablet Take 5 mg by mouth daily.      . sitaGLIPtin (JANUVIA) 100 MG tablet Take 100 mg by mouth daily.      . valACYclovir (VALTREX) 500 MG tablet Take 500 mg by mouth daily. Monday, Wednesday and Friday     . warfarin (COUMADIN) 2.5 MG tablet TAKE AS DIRECTED BY ANTICOAGULATION CLINIC 30 tablet 3  . HUMULIN 70/30 [place] (70-30) 100 UNIT/ML PEN 2 (two) times daily.     No current facility-administered medications for this visit.    Allergies:   Bactrim; Codeine; Latex; and Penicillins   Social History:  The patient  reports that she has quit smoking. She has never used smokeless tobacco. She reports that she does not drink alcohol or use illicit drugs.   Family History:  The patient's family history includes Kidney disease in her brother and sister. There is no history of Heart attack or Stroke. She was adopted.    ROS:  Please see the history of present illness.  Otherwise, review of systems is positive for decreased appetite, hearing loss, cough, rash, sweating, and headaches.  All other systems are reviewed and negative.    PHYSICAL EXAM: VS:  BP 110/70 mmHg  Pulse 61  Ht 5' (1.524 m)  Wt 88 lb (39.917 kg)  BMI 17.19 kg/m2 , BMI Body mass index is 17.19 kg/(m^2). GEN: Well nourished, well developed, in no acute distress HEENT: normal Neck: no JVD, no masses. No carotid bruits Cardiac: Irregular with a grade 2/6 systolic murmur and a diastolic rumble at the left lower sternal border         Respiratory:  clear to auscultation bilaterally, normal work of breathing GI: soft, nontender, nondistended, + BS MS: no deformity or atrophy Ext: no pretibial edema Skin: warm and dry, no rash Neuro:  Strength and sensation are intact Psych: euthymic mood, full affect  EKG:  EKG is ordered today. The ekg ordered today shows atrial fibrillation  with demand pacing. ST and T wave abnormality consider dig effect.  Recent Labs: No results found for requested labs within last 365 days.   Lipid Panel     Component Value Date/Time   CHOL * 08/19/2009 0547    221        ATP III CLASSIFICATION:  <200     mg/dL   Desirable  161-096  mg/dL   Borderline High  >=045    mg/dL   High          TRIG 87 08/19/2009 0547   HDL 113 RESULTS CONFIRMED BY MANUAL DILUTION 08/19/2009 0547   CHOLHDL 2.0 08/19/2009 0547  VLDL 17 08/19/2009 0547   LDLCALC  08/19/2009 0547    91        Total Cholesterol/HDL:CHD Risk Coronary Heart Disease Risk Table                     Men   Women  1/2 Average Risk   3.4   3.3  Average Risk       5.0   4.4  2 X Average Risk   9.6   7.1  3 X Average Risk  23.4   11.0        Use the calculated Patient Ratio above and the CHD Risk Table to determine the patient's CHD Risk.        ATP III CLASSIFICATION (LDL):  <100     mg/dL   Optimal  161-096  mg/dL   Near or Above                    Optimal  130-159  mg/dL   Borderline  045-409  mg/dL   High  >811     mg/dL   Very High      Wt Readings from Last 3 Encounters:  12/05/14 88 lb (39.917 kg)  05/02/14 93 lb (42.185 kg)  11/21/13 94 lb (42.638 kg)     Cardiac Studies Reviewed: 2-D echocardiogram 07/28/2012: Study Conclusions  - Left ventricle: The cavity size was normal. Wall thickness was increased in a pattern of mild LVH. Systolic function was normal. The estimated ejection fraction was in the range of 55% to 60%. - Aortic valve: There was mild stenosis. - Mitral valve: The findings are consistent with severe stenosis. Mild regurgitation. - Left atrium: The atrium was severely dilated. - Right atrium: The atrium was moderately dilated. - Atrial septum: No defect or patent foramen ovale was identified. - Tricuspid valve: Moderate regurgitation. - Pulmonary arteries: PA peak pressure: 54mm Hg (S).   ASSESSMENT AND PLAN: 1.   Severe mitral stenosis: The patient has been managed medically. She has New York Heart Association functional class II symptoms. She does have occasional episodes that sound consistent with pulmonary edema. I think it would be better to put her on maintenance diuretic therapy. She is very concerned about renal function. I have recommended that she take Lasix 20 mg every other day. We will check a metabolic panel in about 3 weeks. I would like her to have a repeat echocardiogram.  2. Chronic atrial fibrillation: Anticoagulated with warfarin and tolerating this well.  3. Lower extremity peripheral arterial disease. Stable without claudication symptoms. Followed by [name].  4. Diabetes, type II: Managed by primary care.  Current medicines are reviewed with the patient today.  The patient does not have concerns regarding medicines.  The following changes have been made:  See above  Labs/ tests ordered today include:   Orders Placed This Encounter  Procedures  . Basic Metabolic Panel (BMET)  . EKG 12-Lead  . 2D Echocardiogram with contrast    Disposition:   FU 6 months with an echocardiogram prior to that office visit  Signed, [name], MD  12/05/2014 1:26 PM    [facility] [address] [place], [place], [place]  [address] Phone: [phone]; Fax: [phone]

## 2014-12-05 NOTE — Patient Instructions (Addendum)
Your physician has recommended you make the following change in your medication:   1. Change Furosamide dose to 20 mg by mouth every other day.  Your physician wants you to follow-up in: 6 months with Dr. Excell Seltzer.  You will receive a reminder letter in the mail two months in advance. If you don't receive a letter, please call our office to schedule the follow-up appointment.  Your physician recommends that you return for lab work in: 3 weeks for a BMET.  Please come to our office on April 20th. Lab is open from 7:30 am-5:00 pm.

## 2014-12-05 NOTE — Progress Notes (Signed)
Echo performed. 

## 2014-12-05 NOTE — Addendum Note (Signed)
Addended by: Cindi Carbon L on: 12/05/2014 02:07 PM   Modules accepted: Orders

## 2014-12-13 ENCOUNTER — Telehealth: Payer: Self-pay

## 2014-12-13 NOTE — Telephone Encounter (Signed)
Echo shows severe mitral stenosis. Normal LV function. Mild-mod pulmonary HTN. Pt clinically stable without exertional dyspnea. Continue observation for now.     ----- Message -----     From: Scarlette Ar     Sent: 12/06/2014 11:06 AM      To: [name], MD        ECHO REPORT SCAN IN UNDER ORDER-LEVEL DOCUMENT    Left message on machine for pt to contact the office.

## 2014-12-14 NOTE — Telephone Encounter (Signed)
Pt aware of Echo results by phone.  

## 2014-12-19 ENCOUNTER — Ambulatory Visit (INDEPENDENT_AMBULATORY_CARE_PROVIDER_SITE_OTHER): Payer: Medicare Other | Admitting: *Deleted

## 2014-12-19 DIAGNOSIS — Z5181 Encounter for therapeutic drug level monitoring: Secondary | ICD-10-CM

## 2014-12-19 DIAGNOSIS — I4891 Unspecified atrial fibrillation: Secondary | ICD-10-CM

## 2014-12-19 LAB — POCT INR: INR: 2

## 2014-12-26 ENCOUNTER — Other Ambulatory Visit: Payer: Self-pay

## 2014-12-26 ENCOUNTER — Other Ambulatory Visit: Payer: Medicare Other

## 2014-12-27 ENCOUNTER — Other Ambulatory Visit (INDEPENDENT_AMBULATORY_CARE_PROVIDER_SITE_OTHER): Payer: Medicare Other | Admitting: *Deleted

## 2014-12-27 DIAGNOSIS — I4819 Other persistent atrial fibrillation: Secondary | ICD-10-CM

## 2014-12-27 DIAGNOSIS — I481 Persistent atrial fibrillation: Secondary | ICD-10-CM | POA: Diagnosis not present

## 2014-12-27 LAB — BASIC METABOLIC PANEL
BUN: 23 mg/dL (ref 6–23)
CALCIUM: 9.6 mg/dL (ref 8.4–10.5)
CO2: 24 mEq/L (ref 19–32)
CREATININE: 1.03 mg/dL (ref 0.40–1.20)
Chloride: 105 mEq/L (ref 96–112)
GFR: 68.84 mL/min (ref >60.00)
GLUCOSE: 137 mg/dL — AB (ref 70–99)
Potassium: 4 mEq/L (ref 3.5–5.1)
Sodium: 136 mEq/L (ref 135–145)

## 2014-12-31 ENCOUNTER — Encounter: Payer: Self-pay | Admitting: *Deleted

## 2014-12-31 ENCOUNTER — Encounter: Payer: Self-pay | Admitting: Internal Medicine

## 2014-12-31 ENCOUNTER — Ambulatory Visit (INDEPENDENT_AMBULATORY_CARE_PROVIDER_SITE_OTHER): Payer: Medicare Other | Admitting: Internal Medicine

## 2014-12-31 VITALS — BP 124/78 | HR 60 | Ht 60.0 in | Wt 92.0 lb

## 2014-12-31 DIAGNOSIS — I482 Chronic atrial fibrillation, unspecified: Secondary | ICD-10-CM

## 2014-12-31 DIAGNOSIS — I495 Sick sinus syndrome: Secondary | ICD-10-CM | POA: Diagnosis not present

## 2014-12-31 LAB — MDC_IDC_ENUM_SESS_TYPE_INCLINIC
Battery Impedance: 770 Ohm
Battery Voltage: 2.77 V
Lead Channel Pacing Threshold Pulse Width: 0.4 ms
Lead Channel Sensing Intrinsic Amplitude: 11.2 mV
Lead Channel Setting Sensing Sensitivity: 4 mV
MDC IDC MSMT LEADCHNL RV IMPEDANCE VALUE: 578 Ohm
MDC IDC MSMT LEADCHNL RV PACING THRESHOLD AMPLITUDE: 0.5 V
MDC IDC SET LEADCHNL RV PACING AMPLITUDE: 2.5 V
MDC IDC SET LEADCHNL RV PACING PULSEWIDTH: 0.4 ms
MDC IDC STAT BRADY RV PERCENT PACED: 44 %

## 2014-12-31 NOTE — Progress Notes (Signed)
Electrophysiology Office Note   Date:  12/31/2014   ID:  [name] [name], [name] [name] 23-Sep-1945, MRN [id number]  PCP:  Shelba Flake, MD  Cardiologist:  Dr Eual Fines Primary Electrophysiologist: Hillis Range, MD    Chief Complaint  Patient presents with  . Atrial Fibrillation  . Tachybrady syndrome     History of Present Illness: [name] [name] is a 69 y.o. female who presents today for electrophysiology evaluation.   She is doing well.  No symptoms with afib.  Tolerating anticoagulation.   Today, she denies symptoms of palpitations, chest pain, shortness of breath, orthopnea, PND, lower extremity edema, claudication, dizziness, presyncope, syncope, bleeding, or neurologic sequela. The patient is tolerating medications without difficulties and is otherwise without complaint today.    Past Medical History  Diagnosis Date  . Diabetes mellitus   . Hypertension   . Chronic kidney disease     s/p renal transplant;  follows with [name]  . Stroke December 2011    left MCA distribution  . Permanent atrial fibrillation     coumadin clinic at [facility]. Has previously required Lovenox->Coumadin bridge.  . Severe mitral stenosis by prior echocardiogram     last echo 07/2012  . Aortic stenosis   . Symptomatic bradycardia     s/p pacer  . Depression   . HSV (herpes simplex virus) infection   . Anemia   . PAD (peripheral artery disease)     a. s/p RLE angioplasty with [name]. b. (by [name])  L popliteral artery orbital atherectomy and balloon angioplasty & L SFA orbital atherectomy and balloon angioplasty 11/30/12.  [name] Hyperparathyroidism, primary     s/p parathyroidectomy   Past Surgical History  Procedure Laterality Date  . Insert / replace / remove pacemaker    . Kidney transplant  December 1999  . Abdominal angiogram  11/30/2012    ABDOMINAL AORTIC ANGIOGRAM   DR ARDIA  . Dialysis fistula creation    . Abdominal aortagram N/A 11/30/2012   Procedure: ABDOMINAL [name];  Surgeon: [name], MD;  Location: [facility] C[facility] LAB;  Service: Cardiovascular;  Laterality: N/A;     Current Outpatient Prescriptions  Medication Sig Dispense Refill  . acetaminophen (TYLENOL) 500 MG tablet Take 500 mg by mouth every 6 (six) hours as needed. For pain    . amLODipine (NORVASC) 5 MG tablet TAKE 1 TABLET BY MOUTH EVERY DAY 30 tablet 5  . amoxicillin (AMOXIL) 500 MG capsule Take 2,000 mg by mouth once. before dental procedure    . b complex-vitamin c-folic acid (NEPHRO-VITE) 0.8 MG TABS Take 0.8 mg by mouth daily.      . Calcium Carbonate-Vitamin D (CALTRATE 600+D) 600-400 MG-UNIT per tablet Take 1 tablet by mouth 2 (two) times daily.     . cycloSPORINE modified (NEORAL/GENGRAF) 25 MG capsule Take 25 mg by mouth 2 (two) times daily.      . digoxin (LANOXIN) 0.125 MG tablet TAKE 1 TABLET BY MOUTH DAILY 30 tablet 3  . ergocalciferol (VITAMIN D2) 50000 UNITS capsule Take 50,000 Units by mouth every 30 (thirty) days. First of the month    . fluticasone (FLONASE) 50 MCG/ACT nasal spray Place 2 sprays into the nose daily.    . furosemide (LASIX) 20 MG tablet Take 1 tablet (20 mg total) by mouth every other day. 30 tablet 6  . HUMULIN 70/30 KWIKPEN (70-30) 100 UNIT/ML PEN 2 (two) times daily.    . hydroxypropyl methylcellulose (ISOPTO TEARS) 2.5 %  ophthalmic solution Place 1 drop into both eyes 4 (four) times daily as needed (dry eyes).    . Insulin Isophane & Regular (HUMULIN 70/30 [place]) Inject 10 Units into the skin every morning.    . Iron 66 MG TABS Take 1 tablet by mouth daily.    . metoprolol (LOPRESSOR) 100 MG tablet TAKE 1 TABLET BY MOUTH TWICE A DAY 60 tablet 5  . mycophenolate (CELLCEPT) 250 MG capsule Take 500 mg by mouth 2 (two) times daily.     . predniSONE (DELTASONE) 5 MG tablet Take 5 mg by mouth daily.      . sitaGLIPtin (JANUVIA) 100 MG tablet Take 100 mg by mouth daily.      . valACYclovir (VALTREX) 500 MG tablet Take 500 mg by mouth 3  (three) times a week. Only on Monday, Wednesday and Friday    . warfarin (COUMADIN) 2.5 MG tablet TAKE AS DIRECTED BY ANTICOAGULATION CLINIC 30 tablet 3   No current facility-administered medications for this visit.    Allergies:   Bactrim; Codeine; Penicillins; and Latex   Social History:  The patient  reports that she has quit smoking. She has never used smokeless tobacco. She reports that she does not drink alcohol or use illicit drugs.   Family History:  The patient's family history includes Kidney disease in her brother and sister. There is no history of Heart attack or Stroke. She was adopted.    ROS:  Please see the history of present illness.   All other systems are reviewed and negative.    PHYSICAL EXAM: VS:  BP 124/78 mmHg  Pulse 60  Ht 5' (1.524 m)  Wt 92 lb (41.731 kg)  BMI 17.97 kg/m2 , BMI Body mass index is 17.97 kg/(m^2). GEN: Well nourished, well developed, in no acute distress HEENT: normal Neck: no JVD, carotid bruits, or masses Cardiac: iRRR; grade 2/6 systolic murmur and a diastolic rumble at the left lower sternal border,no edema  Respiratory:  clear to auscultation bilaterally, normal work of breathing GI: soft, nontender, nondistended, + BS MS: no deformity or atrophy Skin: warm and dry, device pocket is well healed Neuro:  Strength and sensation are intact Psych: euthymic mood, full affect  Device interrogation is reviewed today in detail.  See PaceArt for details.   Recent Labs: 12/27/2014: BUN 23; Creatinine 1.03; Potassium 4.0; Sodium 136    Lipid Panel     Component Value Date/Time   CHOL * 08/19/2009 0547    221        ATP III CLASSIFICATION:  <200     mg/dL   Desirable  161-096  mg/dL   Borderline High  >=045    mg/dL   High          TRIG 87 08/19/2009 0547   HDL 113 RESULTS CONFIRMED BY MANUAL DILUTION 08/19/2009 0547   CHOLHDL 2.0 08/19/2009 0547   VLDL 17 08/19/2009 0547   LDLCALC  08/19/2009 0547    91        Total  Cholesterol/HDL:CHD Risk Coronary Heart Disease Risk Table                     Men   Women  1/2 Average Risk   3.4   3.3  Average Risk       5.0   4.4  2 X Average Risk   9.6   7.1  3 X Average Risk  23.4   11.0  Use the calculated Patient Ratio above and the CHD Risk Table to determine the patient's CHD Risk.        ATP III CLASSIFICATION (LDL):  <100     mg/dL   Optimal  161-096  mg/dL   Near or Above                    Optimal  130-159  mg/dL   Borderline  045-409  mg/dL   High  >811     mg/dL   Very High     Wt Readings from Last 3 Encounters:  12/31/14 92 lb (41.731 kg)  12/05/14 88 lb (39.917 kg)  05/02/14 93 lb (42.185 kg)      Other studies Reviewed: Additional studies/ records that were reviewed today include: Dr Randolm Idol notes    ASSESSMENT AND PLAN:  1.  Permanent afib chads2vasc score is at least 7.  She also has MS. Continue coumadin long term  2. Tachy/brady Normal pacemaker function See Pace Art report Rate response turned off today to reduce V pacing  3. MS Followed by Dr Excell Seltzer  Current medicines are reviewed at length with the patient today.   The patient does not have concerns regarding her medicines.  The following changes were made today:  none  Follow-up: carelink,  Return to see Gypsy Balsam NP in 1 year  Signed, Hillis Range, MD  12/31/2014 10:12 AM     [facility] [address] [place] [address] [phone] (office) [phone] (fax)

## 2014-12-31 NOTE — Patient Instructions (Addendum)
Remote monitoring is used to monitor your pacemaker from home. This monitoring reduces the number of office visits required to check your device to one time per year. It allows us to keep an eye on the functioning of your device to ensure it is working properly. You are scheduled for a device check from home on 04-01-2015. You may send your transmission at any time that day. If you have a wireless device, the transmission will be sent automatically. After your physician reviews your transmission, you will receive a postcard with your next transmission date.   Your physician recommends that you schedule a follow-up appointment in:12 months with Amber Seiler, NP 

## 2015-01-18 ENCOUNTER — Ambulatory Visit (INDEPENDENT_AMBULATORY_CARE_PROVIDER_SITE_OTHER): Payer: Medicare Other | Admitting: *Deleted

## 2015-01-18 DIAGNOSIS — I4891 Unspecified atrial fibrillation: Secondary | ICD-10-CM | POA: Diagnosis not present

## 2015-01-18 DIAGNOSIS — Z5181 Encounter for therapeutic drug level monitoring: Secondary | ICD-10-CM

## 2015-01-18 LAB — POCT INR: INR: 1.9

## 2015-02-07 ENCOUNTER — Ambulatory Visit (INDEPENDENT_AMBULATORY_CARE_PROVIDER_SITE_OTHER): Payer: Medicare Other | Admitting: *Deleted

## 2015-02-07 DIAGNOSIS — I4891 Unspecified atrial fibrillation: Secondary | ICD-10-CM

## 2015-02-07 DIAGNOSIS — Z5181 Encounter for therapeutic drug level monitoring: Secondary | ICD-10-CM | POA: Diagnosis not present

## 2015-02-07 LAB — POCT INR: INR: 2.8

## 2015-02-27 ENCOUNTER — Other Ambulatory Visit: Payer: Self-pay | Admitting: Internal Medicine

## 2015-03-08 ENCOUNTER — Ambulatory Visit (INDEPENDENT_AMBULATORY_CARE_PROVIDER_SITE_OTHER): Payer: Medicare Other | Admitting: Pharmacist

## 2015-03-08 DIAGNOSIS — I4891 Unspecified atrial fibrillation: Secondary | ICD-10-CM | POA: Diagnosis not present

## 2015-03-08 DIAGNOSIS — I482 Chronic atrial fibrillation, unspecified: Secondary | ICD-10-CM

## 2015-03-08 DIAGNOSIS — Z5181 Encounter for therapeutic drug level monitoring: Secondary | ICD-10-CM

## 2015-03-08 LAB — POCT INR: INR: 1.9

## 2015-03-21 ENCOUNTER — Telehealth: Payer: Self-pay | Admitting: Surgery

## 2015-03-22 ENCOUNTER — Ambulatory Visit (INDEPENDENT_AMBULATORY_CARE_PROVIDER_SITE_OTHER): Payer: Medicare Other | Admitting: Pharmacist

## 2015-03-22 DIAGNOSIS — Z5181 Encounter for therapeutic drug level monitoring: Secondary | ICD-10-CM

## 2015-03-22 DIAGNOSIS — I4891 Unspecified atrial fibrillation: Secondary | ICD-10-CM

## 2015-03-22 LAB — POCT INR: INR: 2.3

## 2015-04-01 ENCOUNTER — Ambulatory Visit (INDEPENDENT_AMBULATORY_CARE_PROVIDER_SITE_OTHER): Payer: Medicare Other | Admitting: *Deleted

## 2015-04-01 DIAGNOSIS — I4891 Unspecified atrial fibrillation: Secondary | ICD-10-CM

## 2015-04-01 DIAGNOSIS — Z950 Presence of cardiac pacemaker: Secondary | ICD-10-CM

## 2015-04-13 ENCOUNTER — Other Ambulatory Visit: Payer: Self-pay | Admitting: Internal Medicine

## 2015-04-13 DIAGNOSIS — I4891 Unspecified atrial fibrillation: Secondary | ICD-10-CM | POA: Diagnosis not present

## 2015-04-13 DIAGNOSIS — Z950 Presence of cardiac pacemaker: Secondary | ICD-10-CM | POA: Diagnosis not present

## 2015-04-13 LAB — CUP PACEART REMOTE DEVICE CHECK
Battery Impedance: 949 Ohm
Battery Remaining Longevity: 69 mo
Brady Statistic RV Percent Paced: 26 %
Date Time Interrogation Session: 20160806160545
Lead Channel Impedance Value: 0 Ohm
Lead Channel Impedance Value: 540 Ohm
Lead Channel Pacing Threshold Pulse Width: 0.4 ms
Lead Channel Setting Pacing Amplitude: 2.5 V
Lead Channel Setting Sensing Sensitivity: 4 mV
MDC IDC MSMT BATTERY VOLTAGE: 2.76 V
MDC IDC MSMT LEADCHNL RV PACING THRESHOLD AMPLITUDE: 0.5 V
MDC IDC MSMT LEADCHNL RV SENSING INTR AMPL: 11.2 mV
MDC IDC SET LEADCHNL RV PACING PULSEWIDTH: 0.4 ms

## 2015-04-17 NOTE — Progress Notes (Signed)
PPM remote received.  

## 2015-04-18 ENCOUNTER — Ambulatory Visit (INDEPENDENT_AMBULATORY_CARE_PROVIDER_SITE_OTHER): Payer: Medicare Other

## 2015-04-18 DIAGNOSIS — Z5181 Encounter for therapeutic drug level monitoring: Secondary | ICD-10-CM

## 2015-04-18 DIAGNOSIS — I4891 Unspecified atrial fibrillation: Secondary | ICD-10-CM | POA: Diagnosis not present

## 2015-04-18 LAB — POCT INR: INR: 3.5

## 2015-04-20 ENCOUNTER — Other Ambulatory Visit: Payer: Self-pay | Admitting: Internal Medicine

## 2015-05-06 ENCOUNTER — Other Ambulatory Visit: Payer: Self-pay | Admitting: Cardiovascular Disease

## 2015-05-08 ENCOUNTER — Other Ambulatory Visit: Payer: Self-pay | Admitting: Cardiovascular Disease

## 2015-05-09 ENCOUNTER — Encounter: Payer: Self-pay | Admitting: Cardiology

## 2015-05-09 ENCOUNTER — Telehealth: Payer: Self-pay | Admitting: Internal Medicine

## 2015-05-09 ENCOUNTER — Encounter: Payer: Medicare Other | Admitting: *Deleted

## 2015-05-09 ENCOUNTER — Ambulatory Visit (INDEPENDENT_AMBULATORY_CARE_PROVIDER_SITE_OTHER): Payer: Medicare Other | Admitting: Pharmacist

## 2015-05-09 DIAGNOSIS — Z5181 Encounter for therapeutic drug level monitoring: Secondary | ICD-10-CM | POA: Diagnosis not present

## 2015-05-09 DIAGNOSIS — I4891 Unspecified atrial fibrillation: Secondary | ICD-10-CM

## 2015-05-09 LAB — POCT INR: INR: 2.8

## 2015-05-09 NOTE — Telephone Encounter (Signed)
Spoke w/ pt and informed pt that we received her transmission. Pt verbalized understanding.

## 2015-05-09 NOTE — Telephone Encounter (Signed)
°  1. Has your device fired?  ° °2. Is you device beeping?  ° °3. Are you experiencing draining or swelling at device site?  ° °4. Are you calling to see if we received your device transmission? Yes ° °5. Have you passed out?  ° °

## 2015-05-20 ENCOUNTER — Encounter: Payer: Self-pay | Admitting: Internal Medicine

## 2015-06-03 ENCOUNTER — Ambulatory Visit (INDEPENDENT_AMBULATORY_CARE_PROVIDER_SITE_OTHER): Payer: Medicare Other | Admitting: Pharmacist

## 2015-06-03 DIAGNOSIS — Z5181 Encounter for therapeutic drug level monitoring: Secondary | ICD-10-CM | POA: Diagnosis not present

## 2015-06-03 DIAGNOSIS — I4891 Unspecified atrial fibrillation: Secondary | ICD-10-CM | POA: Diagnosis not present

## 2015-06-03 LAB — POCT INR: INR: 2.7

## 2015-06-05 ENCOUNTER — Other Ambulatory Visit: Payer: Self-pay | Admitting: Cardiovascular Disease

## 2015-06-08 ENCOUNTER — Other Ambulatory Visit: Payer: Self-pay | Admitting: Cardiovascular Disease

## 2015-06-10 ENCOUNTER — Other Ambulatory Visit: Payer: Self-pay | Admitting: Cardiovascular Disease

## 2015-06-19 ENCOUNTER — Other Ambulatory Visit: Payer: Self-pay

## 2015-06-19 DIAGNOSIS — Z1231 Encounter for screening mammogram for malignant neoplasm of breast: Secondary | ICD-10-CM

## 2015-06-24 ENCOUNTER — Other Ambulatory Visit: Payer: Self-pay | Admitting: Internal Medicine

## 2015-07-10 ENCOUNTER — Ambulatory Visit (INDEPENDENT_AMBULATORY_CARE_PROVIDER_SITE_OTHER): Payer: Medicare Other | Admitting: Pharmacist

## 2015-07-10 DIAGNOSIS — Z5181 Encounter for therapeutic drug level monitoring: Secondary | ICD-10-CM | POA: Diagnosis not present

## 2015-07-10 DIAGNOSIS — I4891 Unspecified atrial fibrillation: Secondary | ICD-10-CM

## 2015-07-10 LAB — POCT INR: INR: 2.6

## 2015-07-11 NOTE — Telephone Encounter (Signed)
Late entry:  Pt called to let us know she was started on Levaquin 500mg  X10 days (started Monday). She has had some diarrhea-appt rescheduled for tomorrow at 11:00.   By Alexander Mt, RN

## 2015-07-15 ENCOUNTER — Ambulatory Visit (INDEPENDENT_AMBULATORY_CARE_PROVIDER_SITE_OTHER): Payer: Medicare Other | Admitting: *Deleted

## 2015-07-15 ENCOUNTER — Telehealth: Payer: Self-pay | Admitting: Cardiology

## 2015-07-15 DIAGNOSIS — I495 Sick sinus syndrome: Secondary | ICD-10-CM

## 2015-07-15 NOTE — Telephone Encounter (Signed)
LMOVM reminding pt to send remote transmission.   

## 2015-07-16 ENCOUNTER — Encounter: Payer: Self-pay | Admitting: Cardiology

## 2015-07-18 DIAGNOSIS — I495 Sick sinus syndrome: Secondary | ICD-10-CM | POA: Diagnosis not present

## 2015-07-19 NOTE — Progress Notes (Signed)
Remote pacemaker transmission.   

## 2015-07-22 LAB — CUP PACEART REMOTE DEVICE CHECK
Battery Impedance: 1054 Ohm
Battery Remaining Longevity: 65 mo
Brady Statistic RV Percent Paced: 24 %
Implantable Lead Implant Date: 20101214
Implantable Lead Model: 4470
Implantable Lead Serial Number: 659084
Lead Channel Impedance Value: 0 Ohm
Lead Channel Sensing Intrinsic Amplitude: 11.2 mV
Lead Channel Setting Pacing Amplitude: 2.5 V
Lead Channel Setting Pacing Pulse Width: 0.4 ms
MDC IDC LEAD LOCATION: 753860
MDC IDC MSMT BATTERY VOLTAGE: 2.77 V
MDC IDC MSMT LEADCHNL RV IMPEDANCE VALUE: 525 Ohm
MDC IDC MSMT LEADCHNL RV PACING THRESHOLD AMPLITUDE: 0.375 V
MDC IDC MSMT LEADCHNL RV PACING THRESHOLD PULSEWIDTH: 0.4 ms
MDC IDC SESS DTM: 20161110141150
MDC IDC SET LEADCHNL RV SENSING SENSITIVITY: 4 mV

## 2015-07-23 ENCOUNTER — Encounter: Payer: Self-pay | Admitting: Cardiology

## 2015-08-22 ENCOUNTER — Ambulatory Visit: Payer: Medicare Other

## 2015-08-23 ENCOUNTER — Ambulatory Visit: Payer: Medicare Other

## 2015-08-29 ENCOUNTER — Ambulatory Visit (INDEPENDENT_AMBULATORY_CARE_PROVIDER_SITE_OTHER): Payer: Medicare Other | Admitting: *Deleted

## 2015-08-29 DIAGNOSIS — I4891 Unspecified atrial fibrillation: Secondary | ICD-10-CM

## 2015-08-29 DIAGNOSIS — Z5181 Encounter for therapeutic drug level monitoring: Secondary | ICD-10-CM

## 2015-08-29 LAB — POCT INR: INR: 2.9

## 2015-09-10 ENCOUNTER — Ambulatory Visit: Payer: Medicare Other

## 2015-10-04 ENCOUNTER — Other Ambulatory Visit: Payer: Self-pay | Admitting: Cardiovascular Disease

## 2015-10-08 ENCOUNTER — Other Ambulatory Visit: Payer: Self-pay | Admitting: *Deleted

## 2015-10-08 MED ORDER — AMLODIPINE BESYLATE 5 MG PO TABS: 5.0000 mg | ORAL_TABLET | Freq: Every day | ORAL | Status: AC

## 2015-10-11 ENCOUNTER — Telehealth: Payer: Self-pay | Admitting: *Deleted

## 2015-10-11 NOTE — Telephone Encounter (Signed)
Pt calls to reschedule her appt and states she has ulcers on her feet and was unable to keep her scheduled appt and to inform nurse that she is taking Clindamycin 300 mg tid for 10 days  started on Jan 29 and also on Ultraset 37.5mg /325mg  take 2 tabs tid for 7 days . Appt made for her to be seen in coumadin clinic on Monday FEB 6th and she states understanding

## 2015-10-16 ENCOUNTER — Ambulatory Visit: Payer: Medicare Other

## 2015-10-17 ENCOUNTER — Ambulatory Visit (INDEPENDENT_AMBULATORY_CARE_PROVIDER_SITE_OTHER): Payer: Medicare Other | Admitting: *Deleted

## 2015-10-17 ENCOUNTER — Telehealth: Payer: Self-pay | Admitting: Cardiology

## 2015-10-17 DIAGNOSIS — I495 Sick sinus syndrome: Secondary | ICD-10-CM

## 2015-10-17 NOTE — Telephone Encounter (Signed)
LMOVM reminding pt to send remote transmission.   

## 2015-10-18 NOTE — Progress Notes (Signed)
Remote pacemaker transmission.   

## 2015-10-29 ENCOUNTER — Encounter: Payer: Self-pay | Admitting: Podiatry

## 2015-10-29 ENCOUNTER — Ambulatory Visit (INDEPENDENT_AMBULATORY_CARE_PROVIDER_SITE_OTHER): Payer: Medicare Other | Admitting: Podiatry

## 2015-10-29 ENCOUNTER — Ambulatory Visit (INDEPENDENT_AMBULATORY_CARE_PROVIDER_SITE_OTHER): Payer: Medicare Other

## 2015-10-29 VITALS — Resp 12

## 2015-10-29 DIAGNOSIS — I739 Peripheral vascular disease, unspecified: Secondary | ICD-10-CM | POA: Diagnosis not present

## 2015-10-29 DIAGNOSIS — L97521 Non-pressure chronic ulcer of other part of left foot limited to breakdown of skin: Secondary | ICD-10-CM

## 2015-10-29 DIAGNOSIS — L089 Local infection of the skin and subcutaneous tissue, unspecified: Secondary | ICD-10-CM | POA: Diagnosis not present

## 2015-10-29 DIAGNOSIS — R52 Pain, unspecified: Secondary | ICD-10-CM | POA: Diagnosis not present

## 2015-10-29 NOTE — Progress Notes (Signed)
   Subjective:    Patient ID: [name] [name], female    DOB: 04-11-1946, 70 y.o.   MRN: [id number]  HPI   70 year old female presents the office today original scheduled and evaluated by Dr. Stacie Acres but he asked me to come see her due to infection and wounds on her feet as well.  She states that she has had pain to both of her feet for the last 3 weeks. She was seen in urgent care and she was placed on Plavix which she is still currently taking. She is also. I prescribed tramadol which she has been taking although she states that she feels "loopy" when taking it.  She's had no other treatment. No other complaints at this time.   Review of Systems  Constitutional:       Sinus problems,night sweats  HENT: Positive for hearing loss.   Hematological: Bruises/bleeds easily.  Psychiatric/Behavioral:       Anxiety  All other systems reviewed and are negative.      Objective:   Physical Exam General: AAO x3, NAD  Dermatological:  On the right hallux toenail does appear to be a superficial wound on the distal lateral portion of the toenail. The toenail does appear to be hypertrophic and dystrophic and somewhat loose and the underlying nail bed. There is small amount of purulence expressed from under the toenail of the hallux on the right side. Upon debridement a small wound is present at the distal portion of the toe. No bleeding occurred during this procedure and the wound was  Already present. On the left foot there are superficial wound on the adjacent aspects of the third, fourth toes these appear to be very superficial however with a somewhat granular/fibrotic wound base. There is no swelling erythema, ascending cellulitis, fluctuance, crepitus , malodor. There are no other open lesions or pre-ulcerative lesions identified at this time.  Vascular: DP/PT pulses nonpalpable today.  Neruologic: Grossly intact via light touch bilateral. Vibratory intact via tuning fork bilateral. Protective  threshold with Semmes Wienstein monofilament intact to all pedal sites bilateral. Patellar and Achilles deep tendon reflexes 2+ bilateral. No Babinski or clonus noted bilateral.   Musculoskeletal:  Hammertoes are present. No other significant deformities present.  Gait: Unassisted, Nonantalgic.      Assessment & Plan:   70 year old female with right hallux infection, wounds left foot , PVD -Treatment options discussed including all alternatives, risks, and complications -Etiology of symptoms were discussed -She needs to have asked her studies and also consult.  This was ordered today. -Right hallux toenail was debrided without any complications or bleeding. There was an underlying ulceration distally in purulence. The area was debrided. -Offloading pads were provided for between the toes. -I had a long discussion the patient in regards neurovascular status and the wounds that she is at high risk of limb loss. -Continue antibiotics. -Continue tramadol for pain. -If there are any signs or symptoms of force infection she needs to go directly to the emergency room. -Follow-up in 1 week or sooner if any problems arise. In the meantime, encouraged to call the office with any questions, concerns, change in symptoms.   Ovid Curd, DPm

## 2015-10-30 ENCOUNTER — Other Ambulatory Visit: Payer: Self-pay | Admitting: Podiatry

## 2015-10-30 DIAGNOSIS — L97501 Non-pressure chronic ulcer of other part of unspecified foot limited to breakdown of skin: Secondary | ICD-10-CM

## 2015-10-30 DIAGNOSIS — I739 Peripheral vascular disease, unspecified: Secondary | ICD-10-CM

## 2015-10-31 ENCOUNTER — Other Ambulatory Visit: Payer: Self-pay | Admitting: Cardiovascular Disease

## 2015-11-04 ENCOUNTER — Encounter: Payer: Self-pay | Admitting: Sports Medicine

## 2015-11-04 ENCOUNTER — Ambulatory Visit (INDEPENDENT_AMBULATORY_CARE_PROVIDER_SITE_OTHER): Payer: Medicare Other | Admitting: Sports Medicine

## 2015-11-04 DIAGNOSIS — M79671 Pain in right foot: Secondary | ICD-10-CM | POA: Diagnosis not present

## 2015-11-04 DIAGNOSIS — I739 Peripheral vascular disease, unspecified: Secondary | ICD-10-CM | POA: Diagnosis not present

## 2015-11-04 DIAGNOSIS — L97511 Non-pressure chronic ulcer of other part of right foot limited to breakdown of skin: Secondary | ICD-10-CM | POA: Diagnosis not present

## 2015-11-04 DIAGNOSIS — L97521 Non-pressure chronic ulcer of other part of left foot limited to breakdown of skin: Secondary | ICD-10-CM | POA: Diagnosis not present

## 2015-11-04 DIAGNOSIS — M79672 Pain in left foot: Secondary | ICD-10-CM

## 2015-11-04 MED ORDER — LIDOCAINE 5 % EX OINT: 1.0000 "application " | TOPICAL_OINTMENT | CUTANEOUS | Status: AC | PRN

## 2015-11-04 NOTE — Progress Notes (Signed)
Patient ID: [name] [name], female   DOB: 28-Jun-1946, 70 y.o.   MRN: [id number] Subjective: [name] [name] is a 70 y.o. female patient seen in office for evaluation of ulceration of the right hallux and left 5th toe with interdigital maceration. Patient has a history of ESR and LE Stents; Admits to extreme pain to toes. On Keflex for puss that was noted at right hallux nail and ulcer site. Denies nausea/fever/vomiting/chills/night sweats/shortness of breath/Calf pain. Patient has no other pedal complaints at this time.  Patient Active Problem List   Diagnosis Date Noted  . Encounter for therapeutic drug monitoring 10/06/2013  . Colon cancer screening 06/01/2012  . 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 PRIMARY HYPERPARATHYROIDISM 08/06/2010  . ANEMIA 08/06/2010  . DEPRESSION 08/06/2010  . MITRAL STENOSIS 08/06/2010  . ATRIAL FIBRILLATION 08/06/2010  . 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 DIASTOLIC DYSFUNCTION 08/65/7846  . CVA 08/06/2010  . PVD 08/06/2010  . RENAL FAILURE, END STAGE 08/06/2010  . PACEMAKER, PERMANENT 08/06/2010   Current Outpatient Prescriptions on File Prior to Visit  Medication Sig Dispense Refill  . acetaminophen (TYLENOL) 500 MG tablet Take 500 mg by mouth every 6 (six) hours as needed. For pain    . albuterol (PROVENTIL) (2.5 MG/3ML) 0.083% nebulizer solution Take 2.5 mg by nebulization every 6 (six) hours as needed for wheezing or shortness of breath.    [name] amLODipine (NORVASC) 5 MG tablet Take 1 tablet (5 mg total) by mouth daily. 90 tablet 0  . amoxicillin (AMOXIL) 500 MG capsule Take 2,000 mg by mouth once. before dental procedure    . azelastine (OPTIVAR) 0.05 % ophthalmic solution 1 drop 2 (two) times daily.    [name] azithromycin (ZITHROMAX) 250 MG tablet TAKE 2 TABLETS BY MOUTH TODAY, THEN TAKE 1 TABLET DAILY FOR 4 DAYS  0  . b complex-vitamin c-folic acid (NEPHRO-VITE) 0.8 MG TABS Take 0.8 mg by mouth daily.      . benzonatate (TESSALON) 100 MG capsule TAKE ONE CAPSULE  BY MOUTH 3 TIMES A DAY AS NEEDED FOR COUGH FOR 7 DAYS  0  . Calcium Carbonate-Vitamin D (CALTRATE 600+D) 600-400 MG-UNIT per tablet Take 1 tablet by mouth 2 (two) times daily.     . cephALEXin (KEFLEX) 500 MG capsule     . cycloSPORINE modified (NEORAL/GENGRAF) 25 MG capsule Take 25 mg by mouth 2 (two) times daily.      . digoxin (LANOXIN) 0.125 MG tablet Take by mouth.    . ergocalciferol (VITAMIN D2) 50000 UNITS capsule Take 50,000 Units by mouth every 30 (thirty) days. First of the month    . fluticasone (FLONASE) 50 MCG/ACT nasal spray Place 2 sprays into the nose daily.    . furosemide (LASIX) 20 MG tablet TAKE 1 TABLET BY MOUTH EVERY OTHER DAY 30 tablet 0  . hydroxypropyl methylcellulose (ISOPTO TEARS) 2.5 % ophthalmic solution Place 1 drop into both eyes 4 (four) times daily as needed (dry eyes).    . insulin detemir (LEVEMIR) 100 UNIT/ML injection Inject 10 Units into the skin at bedtime.    . insulin NPH-regular Human (NOVOLIN 70/30) (70-30) 100 UNIT/ML injection Inject into the skin.    . Iron 66 MG TABS Take 1 tablet by mouth daily.    . iron polysaccharides (NIFEREX) 150 MG capsule Take 150 mg by mouth.    . loratadine (CLARITIN) 10 MG tablet Take 10 mg by mouth daily.    . metoprolol (LOPRESSOR) 100 MG tablet TAKE 1 TABLET BY MOUTH TWICE A DAY  60 tablet 0  . mupirocin cream (BACTROBAN) 2 % APPLY TO AFFECTED AREA 3 TIMES A DAY FOR 10 DAYS  0  . mycophenolate (CELLCEPT) 250 MG capsule Take 500 mg by mouth 2 (two) times daily.     [name] NOVOLOG FLEXPEN 100 UNIT/ML FlexPen USE AS DIRECTED PER SLIDING SCALE 30 MINUTES BEFORE MEALS  3  . ONE TOUCH ULTRA TEST test strip TEST BLOOD SUGAR 3 TIMES A DAY (E11.9)  3  . predniSONE (DELTASONE) 5 MG tablet Take 5 mg by mouth daily.      . sitaGLIPtin (JANUVIA) 100 MG tablet Take 100 mg by mouth daily.      . valACYclovir (VALTREX) 500 MG tablet Take 500 mg by mouth 3 (three) times a week. Only on Monday, Wednesday and Friday    . warfarin (COUMADIN)  2.5 MG tablet TAKE AS DIRECTED BY ANTICOAGULATION CLINIC 30 tablet 3   No current facility-administered medications on file prior to visit.   Allergies  Allergen Reactions  . Bactrim [Sulfamethoxazole-Trimethoprim] Itching  . Aspirin Other (See Comments)  . Codeine Hives and Nausea Only  . Penicillins Nausea And Vomiting  . Latex Rash    Recent Results (from the past 2160 hour(s))  POCT INR     Status: None   Collection Time: 08/29/15  2:08 PM  Result Value Ref Range   INR 2.9     Objective: There were no vitals filed for this visit.  General: Patient is awake, alert, oriented x 3 and in no acute distress.  Dermatology: Skin is warm and dry bilateral with a partial thickness ulceration present at right medial hallux distal tuft and left 5th DIPJ with maceration 3-4 webspace on left. Ulcerations measures <0.5cm There is a mildly granular border with a fibrotic base. The ulcerations do not probe to bone. There is no malodor, no active drainage, no erythema, no cellulitis, no puss, no edema. No acute signs of infection.   Vascular: Dorsalis Pedis pulse = 0/4 Bilateral,  Posterior Tibial pulse = 0/4 Bilateral,  Capillary Fill Time 5 seconds  Neurologic: Patient is hypersensitive and is in extreme pain with touch to toes.   Musculosketal: + Pain with palpation to ulcerated area. Mild hammertoes bilateral. No pain with compression to calves bilateral.   Assessment and Plan:  Problem List Items Addressed This Visit    None    Visit Diagnoses    Foot pain, bilateral    -  Primary    Relevant Medications    lidocaine (XYLOCAINE) 5 % ointment    Toe ulcer, left, limited to breakdown of skin (HCC)        Toe ulcer, right, limited to breakdown of skin ([place])        PAD (peripheral artery disease) ([place])           -Examined patient and discussed the progression of the wound and treatment alternatives. -Applied topical antibiotic and dry sterile dressing and instructed patient to  continue with daily dressings at home consisting of neosporin and bandaid/dry sterile dressing. -Recommended patient to complete vascular study; ulceration with extreme pain is likely vascular in nature; patient is scheduled for 11/07/15 for these studies to be completed; Need close vascular follow up/consultation -Cont with post op shoes bilateral and keflex until completed -Cont with Tramadol as needed for pain. Rx topical lidocaine to help with pain to toes and instructed on use.  - Advised patient to go to the ER or return to office if the wound worsens  or if constitutional symptoms are present. -Patient to return to office in 1-2 weeks for follow up care and evaluation or sooner if problems arise. May consider repeat Xrays at next visit if fails to improve.   [name] [name], DPM

## 2015-11-06 ENCOUNTER — Ambulatory Visit (INDEPENDENT_AMBULATORY_CARE_PROVIDER_SITE_OTHER): Payer: Medicare Other | Admitting: Cardiovascular Disease

## 2015-11-06 ENCOUNTER — Encounter: Payer: Self-pay | Admitting: Cardiovascular Disease

## 2015-11-06 ENCOUNTER — Ambulatory Visit (INDEPENDENT_AMBULATORY_CARE_PROVIDER_SITE_OTHER): Payer: Medicare Other | Admitting: *Deleted

## 2015-11-06 VITALS — BP 128/62 | HR 64 | Ht 60.0 in | Wt 92.0 lb

## 2015-11-06 DIAGNOSIS — Z5181 Encounter for therapeutic drug level monitoring: Secondary | ICD-10-CM

## 2015-11-06 DIAGNOSIS — I050 Rheumatic mitral stenosis: Secondary | ICD-10-CM

## 2015-11-06 DIAGNOSIS — I4891 Unspecified atrial fibrillation: Secondary | ICD-10-CM

## 2015-11-06 DIAGNOSIS — I739 Peripheral vascular disease, unspecified: Secondary | ICD-10-CM | POA: Diagnosis not present

## 2015-11-06 LAB — POCT INR: INR: 1.9

## 2015-11-06 NOTE — Progress Notes (Signed)
Cardiology Office Note Date:  11/08/2015   ID:  [name], [name] [birth year], MRN [id number]  PCP:  [name] Garibaldi, NP  Cardiologist:  [name] Bollman, MD    Chief Complaint  Patient presents with  . Foot Pain    History of Present Illness: [name] [name] is a 70 y.o. female who presents for follow-up of permanent atrial fibrillation, mitral stenosis, and lower extremity peripheral arterial disease. In 2014 she underwent atherectomy of the left SFA and popliteal artery after presenting with symptoms of critical limb ischemia.   The patient's primary complaint today is bilateral foot pain and ulceration. The left foot is worse than the right. She has ulcers and pain primarily on her toes. She also has severe pain in her feet with walking. She does not have typical calf claudication symptoms. Pain occurs at rest and with ambulation. She denies chest pain, dyspnea, orthopnea, or PND. She does have some ankle and foot edema. She has tolerated long-term anticoagulation with warfarin. She has chronic kidney disease and has undergone renal transplantation. She has recently been followed closely by podiatry and has a referral for noninvasive vascular studies pending.  Past Medical History  Diagnosis Date  . Diabetes mellitus   . Hypertension   . Chronic kidney disease     s/p renal transplant;  follows with Dr. Elvis [name]  . Stroke [facility]) December 2011    left MCA distribution  . Permanent atrial fibrillation (HCC)     coumadin clinic at [facility]. Has previously required Lovenox->Coumadin bridge.  . Severe mitral stenosis by prior echocardiogram     last echo 07/2012  . Aortic stenosis   . Symptomatic bradycardia     s/p pacer  . Depression   . HSV (herpes simplex virus) infection   . Anemia   . PAD (peripheral artery disease) (HCC)     a. s/p RLE angioplasty with [name]. b. (by [name])  L popliteral artery orbital atherectomy and balloon angioplasty & L  SFA orbital atherectomy and balloon angioplasty 11/30/12.  [name] Hyperparathyroidism, primary [facility])     s/p parathyroidectomy    Past Surgical History  Procedure Laterality Date  . Insert / replace / remove pacemaker    . Kidney transplant  December 1999  . Abdominal angiogram  11/30/2012    ABDOMINAL AORTIC ANGIOGRAM   DR ARDIA  . Dialysis fistula creation    . Abdominal aortagram N/A 11/30/2012    Procedure: ABDOMINAL [name] [name];  Surgeon: [name], MD;  Location: [facility] CATH LAB;  Service: Cardiovascular;  Laterality: N/A;    Current Outpatient Prescriptions  Medication Sig Dispense Refill  . acetaminophen (TYLENOL) 500 MG tablet Take 500 mg by mouth every 6 (six) hours as needed. For pain    . albuterol (PROVENTIL) (2.5 MG/3ML) 0.083% nebulizer solution Take 2.5 mg by nebulization every 6 (six) hours as needed for wheezing or shortness of breath.    [name] amLODipine (NORVASC) 5 MG tablet Take 1 tablet (5 mg total) by mouth daily. 90 tablet 0  . amoxicillin (AMOXIL) 500 MG capsule Take 2,000 mg by mouth once. before dental procedure    . azelastine (OPTIVAR) 0.05 % ophthalmic solution 1 drop 2 (two) times daily.    [name] azithromycin (ZITHROMAX) 250 MG tablet TAKE 2 TABLETS BY MOUTH TODAY, THEN TAKE 1 TABLET DAILY FOR 4 DAYS  0  . b complex-vitamin c-folic acid (NEPHRO-VITE) 0.8 MG TABS Take 0.8 mg by mouth daily.      [name]  benzonatate (TESSALON) 100 MG capsule TAKE ONE CAPSULE BY MOUTH 3 TIMES A DAY AS NEEDED FOR COUGH FOR 7 DAYS  0  . Calcium Carbonate-Vitamin D (CALTRATE 600+D) 600-400 MG-UNIT per tablet Take 1 tablet by mouth 2 (two) times daily.     . cephALEXin (KEFLEX) 500 MG capsule 500 mg 4 (four) times daily. For ulcers on feet    . cycloSPORINE modified (NEORAL/GENGRAF) 25 MG capsule Take 25 mg by mouth 2 (two) times daily.      . digoxin (LANOXIN) 0.125 MG tablet Take by mouth.    . ergocalciferol (VITAMIN D2) 50000 UNITS capsule Take 50,000 Units by mouth every 30 (thirty) days. First  of the month    . fluticasone (FLONASE) 50 MCG/ACT nasal spray Place 2 sprays into the nose daily.    . furosemide (LASIX) 20 MG tablet TAKE 1 TABLET BY MOUTH EVERY OTHER DAY 30 tablet 0  . hydroxypropyl methylcellulose (ISOPTO TEARS) 2.5 % ophthalmic solution Place 1 drop into both eyes 4 (four) times daily as needed (dry eyes).    . insulin detemir (LEVEMIR) 100 UNIT/ML injection Inject 10 Units into the skin at bedtime.    . insulin NPH-regular Human (NOVOLIN 70/30) (70-30) 100 UNIT/ML injection Inject into the skin.    . Iron 66 MG TABS Take 1 tablet by mouth daily.    . iron polysaccharides (NIFEREX) 150 MG capsule Take 150 mg by mouth.    . lidocaine (XYLOCAINE) 5 % ointment Apply 1 application topically as needed. For foot pain 35.44 g 0  . loratadine (CLARITIN) 10 MG tablet Take 10 mg by mouth daily.    . metoprolol (LOPRESSOR) 100 MG tablet TAKE 1 TABLET BY MOUTH TWICE A DAY 60 tablet 0  . mupirocin cream (BACTROBAN) 2 % APPLY TO AFFECTED AREA 3 TIMES A DAY FOR 10 DAYS  0  . mycophenolate (CELLCEPT) 250 MG capsule Take 500 mg by mouth 2 (two) times daily.     [name] NOVOLOG FLEXPEN 100 UNIT/ML FlexPen USE AS DIRECTED PER SLIDING SCALE 30 MINUTES BEFORE MEALS  3  . ONE TOUCH ULTRA TEST test strip TEST BLOOD SUGAR 3 TIMES A DAY (E11.9)  3  . predniSONE (DELTASONE) 5 MG tablet Take 5 mg by mouth daily.      . sitaGLIPtin (JANUVIA) 100 MG tablet Take 100 mg by mouth daily.      . valACYclovir (VALTREX) 500 MG tablet Take 500 mg by mouth 3 (three) times a week. Only on Monday, Wednesday and Friday    . warfarin (COUMADIN) 2.5 MG tablet TAKE AS DIRECTED BY ANTICOAGULATION CLINIC 30 tablet 3   No current facility-administered medications for this visit.    Allergies:   Bactrim; Aspirin; Codeine; Penicillins; and Latex   Social History:  The patient  reports that she has quit smoking. She has never used smokeless tobacco. She reports that she does not drink alcohol or use illicit drugs.    Family History:  The patient's  family history includes Kidney disease in her brother and sister. There is no history of Heart attack or Stroke. She was adopted.    ROS:  Please see the history of present illness.  Otherwise, review of systems is positive for weight loss, decreased appetite, chills, hearing loss, dizziness, excessive sweating, excessive fatigue, anxiety, balance problems, headaches.  All other systems are reviewed and negative.    PHYSICAL EXAM: VS:  BP 128/62 mmHg  Pulse 64  Ht 5' (1.524 m)  Wt 41.731 kg (  92 lb)  BMI 17.97 kg/m2 , BMI Body mass index is 17.97 kg/(m^2). GEN: pleasant but frail woman in no acute distress HEENT: normal Neck: no JVD, no masses. No carotid bruits Cardiac: irregularly irregular with 3/6 systolic murmur at the LSB, no diastolic rumble or murmur               Respiratory:  clear to auscultation bilaterally, normal work of breathing GI: soft, nontender, nondistended, + BS MS: no deformity or atrophy Ext: trace bilateral pretibial edema, pedal pulses nonpalpable.  Skin: There are tender ulcerations on the second third and fourth toes of the left foot. Neuro:  Strength and sensation are intact Psych: euthymic mood, full affect  EKG:  EKG is ordered today. The ekg ordered today shows atrial fibrillation 64 bpm, pulmonary disease pattern, low-voltage, left axis deviation, ST and T wave abnormality consider lateral ischemia  Recent Labs: 12/27/2014: BUN 23; Creatinine, Ser 1.03; Potassium 4.0; Sodium 136   Lipid Panel     Component Value Date/Time   CHOL * 08/19/2009 0547    221        ATP III CLASSIFICATION:  <200     mg/dL   Desirable  144-818  mg/dL   Borderline High  >=563    mg/dL   High          TRIG 87 08/19/2009 0547   HDL 113 RESULTS CONFIRMED BY MANUAL DILUTION 08/19/2009 0547   CHOLHDL 2.0 08/19/2009 0547   VLDL 17 08/19/2009 0547   LDLCALC  08/19/2009 0547    91        Total Cholesterol/HDL:CHD Risk Coronary Heart  Disease Risk Table                     Men   Women  1/2 Average Risk   3.4   3.3  Average Risk       5.0   4.4  2 X Average Risk   9.6   7.1  3 X Average Risk  23.4   11.0        Use the calculated Patient Ratio above and the CHD Risk Table to determine the patient's CHD Risk.        ATP III CLASSIFICATION (LDL):  <100     mg/dL   Optimal  149-702  mg/dL   Near or Above                    Optimal  130-159  mg/dL   Borderline  637-858  mg/dL   High  >850     mg/dL   Very High      Wt Readings from Last 3 Encounters:  11/06/15 41.731 kg (92 lb)  12/31/14 41.731 kg (92 lb)  12/05/14 39.917 kg (88 lb)     Cardiac Studies Reviewed: 2D Echo 12/05/2014: Left ventricle: The cavity size was normal. Wall thickness was increased in a pattern of mild LVH. Systolic function was normal. The estimated ejection fraction was in the range of 60% to 65%.  ------------------------------------------------------------------- Aortic valve:  Moderately thickened, moderately calcified leaflets. Doppler:  There was mild stenosis.   VTI ratio of LVOT to aortic valve: 0.28. Valve area (VTI): 0.56 cm^2. Indexed valve area (VTI): 0.42 cm^2/m^2. Valve area (Vmax): 0.47 cm^2. Indexed valve area (Vmax): 0.35 cm^2/m^2. Mean velocity ratio of LVOT to aortic valve: 0.27. Valve area (Vmean): 0.54 cm^2. Indexed valve area (Vmean): 0.41 cm^2/m^2.  Mean gradient (S): 11 mm Hg. Peak gradient (  S): 32 mm Hg.  ------------------------------------------------------------------- Aorta: Aortic root: The aortic root was normal in size. Ascending aorta: The ascending aorta was normal in size.  ------------------------------------------------------------------- Mitral valve:  Severely calcified annulus. Severely thickened, severely calcified leaflets . Doppler:  The findings are consistent with severe stenosis.   Valve area by continuity equation (using LVOT flow): 0.23 cm^2. Indexed valve area  by continuity equation (using LVOT flow): 0.17 cm^2/m^2.  Mean gradient (D): 11 mm Hg. Peak gradient (D): 22 mm Hg.  ------------------------------------------------------------------- Left atrium: The atrium was severely dilated.  ------------------------------------------------------------------- Right ventricle: The cavity size was normal. Systolic function was normal.  ------------------------------------------------------------------- Pulmonic valve:  The valve appears to be grossly normal. Doppler: There was trivial regurgitation.  ------------------------------------------------------------------- Tricuspid valve:  Structurally normal valve.  Leaflet separation was normal. Doppler: Transvalvular velocity was within the normal range. There was severe regurgitation.  ------------------------------------------------------------------- Pulmonary artery:  Systolic pressure was mildly to moderately increased.  ------------------------------------------------------------------- Right atrium: The atrium was moderately dilated.  ------------------------------------------------------------------- Pericardium: There was no pericardial effusion.   ASSESSMENT AND PLAN: 1.  PAD with ulceration/critical limb ischemia: ABI's/doppler studies scheduled. PV referral Dr Kirke Corin - arranged next Tuesday. She will likely need a lower extremity angiogram and she understands this.   2. Chronic atrial fibrillation: Heart rate is well controlled. She is tolerating warfarin without leading problems. If she requires invasive angiography, she should be bridged with Lovenox because of her history of stroke, atrial fibrillation, and mitral stenosis  3. Severe mitral valve stenosis: Remains asymptomatic, likely because of her severe PAD and low functional capacity. Will update an echocardiogram. The patient does have significant pulmonary hypertension.  4. Essential hypertension: Continue  treatment with amlodipine, furosemide, and metoprolol  5. Chronic kidney disease: Patient is status post renal transplant. Followed closely by nephrology.  Current medicines are reviewed with the patient today.  The patient does not have concerns regarding medicines.  Labs/ tests ordered today include:   Orders Placed This Encounter  Procedures  . EKG 12-Lead  . Echocardiogram    Disposition:   FU 6 months  Signed, [name] Bollman, MD  11/08/2015 5:30 PM    [facility] [address] [place], [place], [place]  [address] Phone: [phone]; Fax: [phone]

## 2015-11-06 NOTE — Patient Instructions (Signed)
Medication Instructions:  Your physician recommends that you continue on your current medications as directed. Please refer to the Current Medication list given to you today.  Labwork: No new orders.   Testing/Procedures: Your physician has requested that you have an echocardiogram in April 2017. Echocardiography is a painless test that uses sound waves to create images of your heart. It provides your doctor with information about the size and shape of your heart and how well your heart's chambers and valves are working. This procedure takes approximately one hour. There are no restrictions for this procedure.  Follow-Up: Your physician wants you to follow-up in: 6 MONTHS with Dr Excell Seltzer. You will receive a reminder letter in the mail two months in advance. If you don't receive a letter, please call our office to schedule the follow-up appointment.  Your physician recommends that you schedule a follow-up appointment ASAP with Dr Kirke Corin.    Any Other Special Instructions Will Be Listed Below (If Applicable).     If you need a refill on your cardiac medications before your next appointment, please call your pharmacy.

## 2015-11-07 ENCOUNTER — Ambulatory Visit (HOSPITAL_COMMUNITY)
Admission: RE | Admit: 2015-11-07 | Discharge: 2015-11-07 | Disposition: A | Discharge status: Home / Self Care | Payer: Medicare Other | Source: Ambulatory Visit | Attending: Cardiology | Admitting: Cardiology

## 2015-11-07 ENCOUNTER — Other Ambulatory Visit: Payer: Self-pay | Admitting: Podiatry

## 2015-11-07 DIAGNOSIS — L97501 Non-pressure chronic ulcer of other part of unspecified foot limited to breakdown of skin: Secondary | ICD-10-CM | POA: Diagnosis not present

## 2015-11-07 DIAGNOSIS — E1122 Type 2 diabetes mellitus with diabetic chronic kidney disease: Secondary | ICD-10-CM | POA: Diagnosis not present

## 2015-11-07 DIAGNOSIS — I739 Peripheral vascular disease, unspecified: Secondary | ICD-10-CM

## 2015-11-07 DIAGNOSIS — Z940 Kidney transplant status: Secondary | ICD-10-CM | POA: Diagnosis not present

## 2015-11-07 DIAGNOSIS — I129 Hypertensive chronic kidney disease with stage 1 through stage 4 chronic kidney disease, or unspecified chronic kidney disease: Secondary | ICD-10-CM | POA: Diagnosis not present

## 2015-11-07 DIAGNOSIS — N189 Chronic kidney disease, unspecified: Secondary | ICD-10-CM | POA: Diagnosis not present

## 2015-11-07 DIAGNOSIS — M79676 Pain in unspecified toe(s): Secondary | ICD-10-CM | POA: Diagnosis present

## 2015-11-10 ENCOUNTER — Encounter: Payer: Self-pay | Admitting: Cardiology

## 2015-11-10 ENCOUNTER — Emergency Department (HOSPITAL_COMMUNITY)
Admission: EM | Admit: 2015-11-10 | Discharge: 2015-11-10 | Disposition: A | Discharge status: Home / Self Care | Payer: Medicare Other | Attending: Emergency Medicine | Admitting: Emergency Medicine

## 2015-11-10 ENCOUNTER — Encounter (HOSPITAL_COMMUNITY): Payer: Self-pay | Admitting: Nurse Practitioner

## 2015-11-10 DIAGNOSIS — Z7984 Long term (current) use of oral hypoglycemic drugs: Secondary | ICD-10-CM | POA: Insufficient documentation

## 2015-11-10 DIAGNOSIS — Z7951 Long term (current) use of inhaled steroids: Secondary | ICD-10-CM | POA: Diagnosis not present

## 2015-11-10 DIAGNOSIS — Z79899 Other long term (current) drug therapy: Secondary | ICD-10-CM | POA: Diagnosis not present

## 2015-11-10 DIAGNOSIS — L97529 Non-pressure chronic ulcer of other part of left foot with unspecified severity: Secondary | ICD-10-CM | POA: Insufficient documentation

## 2015-11-10 DIAGNOSIS — N189 Chronic kidney disease, unspecified: Secondary | ICD-10-CM | POA: Insufficient documentation

## 2015-11-10 DIAGNOSIS — I129 Hypertensive chronic kidney disease with stage 1 through stage 4 chronic kidney disease, or unspecified chronic kidney disease: Secondary | ICD-10-CM | POA: Insufficient documentation

## 2015-11-10 DIAGNOSIS — Z8673 Personal history of transient ischemic attack (TIA), and cerebral infarction without residual deficits: Secondary | ICD-10-CM | POA: Insufficient documentation

## 2015-11-10 DIAGNOSIS — Z794 Long term (current) use of insulin: Secondary | ICD-10-CM | POA: Diagnosis not present

## 2015-11-10 DIAGNOSIS — E11621 Type 2 diabetes mellitus with foot ulcer: Secondary | ICD-10-CM | POA: Insufficient documentation

## 2015-11-10 DIAGNOSIS — D649 Anemia, unspecified: Secondary | ICD-10-CM | POA: Insufficient documentation

## 2015-11-10 DIAGNOSIS — Z880 Allergy status to penicillin: Secondary | ICD-10-CM | POA: Diagnosis not present

## 2015-11-10 DIAGNOSIS — I4891 Unspecified atrial fibrillation: Secondary | ICD-10-CM | POA: Insufficient documentation

## 2015-11-10 DIAGNOSIS — Z91040 Latex allergy status: Secondary | ICD-10-CM | POA: Insufficient documentation

## 2015-11-10 DIAGNOSIS — Z8619 Personal history of other infectious and parasitic diseases: Secondary | ICD-10-CM | POA: Insufficient documentation

## 2015-11-10 DIAGNOSIS — Z87891 Personal history of nicotine dependence: Secondary | ICD-10-CM | POA: Diagnosis not present

## 2015-11-10 DIAGNOSIS — Z792 Long term (current) use of antibiotics: Secondary | ICD-10-CM | POA: Insufficient documentation

## 2015-11-10 DIAGNOSIS — M79672 Pain in left foot: Secondary | ICD-10-CM | POA: Diagnosis present

## 2015-11-10 LAB — CUP PACEART REMOTE DEVICE CHECK
Battery Voltage: 2.76 V
Date Time Interrogation Session: 20170210022504
Implantable Lead Implant Date: 20101214
Implantable Lead Model: 4470
Implantable Lead Serial Number: 659084
Lead Channel Impedance Value: 534 Ohm
Lead Channel Setting Pacing Amplitude: 2.5 V
MDC IDC LEAD LOCATION: 753860
MDC IDC MSMT BATTERY IMPEDANCE: 1157 Ohm
MDC IDC MSMT BATTERY REMAINING LONGEVITY: 61 mo
MDC IDC MSMT LEADCHNL RA IMPEDANCE VALUE: 0 Ohm
MDC IDC SET LEADCHNL RV PACING PULSEWIDTH: 0.4 ms
MDC IDC SET LEADCHNL RV SENSING SENSITIVITY: 4 mV
MDC IDC STAT BRADY RV PERCENT PACED: 24 %

## 2015-11-10 MED ORDER — CLINDAMYCIN HCL 150 MG PO CAPS: 450.0000 mg | ORAL_CAPSULE | Freq: Four times a day (QID) | ORAL | Status: DC

## 2015-11-10 NOTE — Progress Notes (Signed)
Normal remote reviewed. VHR episodes likely AF with RVR  Next follow up 12/2015 in clinic

## 2015-11-10 NOTE — ED Notes (Signed)
Pt states she was advised by her podiatrist that should her diabetic feet ulcers get as worse as they are right now to come to the hospital for further eval. She states the ulcers constantly draining and severely painful, endorses her diabetes as controlled. Denies any other symptoms.

## 2015-11-10 NOTE — Discharge Instructions (Signed)
Follow up with your podiatrist.  Call them in the morning.

## 2015-11-10 NOTE — ED Provider Notes (Addendum)
CSN: [id number]     Arrival date & time 11/10/15  1628 History   First MD Initiated Contact with Patient 11/10/15 1807     Chief [id number]  Patient presents with  . Diabetic Feet Ulcers      (Consider location/radiation/quality/duration/timing/severity/associated sxs/prior Treatment) Patient is a 70 y.o. female presenting with general illness. The history is provided by the patient and a relative.  Illness Severity:  Moderate Onset quality:  Gradual Duration:  1 month Timing:  Constant Progression:  Worsening Chronicity:  New Associated symptoms: no chest pain, no congestion, no fever, no headaches, no myalgias, no nausea, no rhinorrhea, no shortness of breath, no vomiting and no wheezing    70 yo F with ulcers between the third fourth and fifth digit of her left toes. He started about a month ago. Patient has been seeing a podiatrist for this. She said that they told her if anything worsen that she come immediately to the emergency department. Feel that her pain is worsened and so she is here. Denies spreading of the ulcerations denies fevers or chills denies nausea or vomiting.  Past Medical History  Diagnosis Date  . Diabetes mellitus   . Hypertension   . Chronic kidney disease     s/p renal transplant;  follows with [name]  . Stroke [facility]) December 2011    left MCA distribution  . Permanent atrial fibrillation (HCC)     coumadin clinic at [facility]. Has previously required Lovenox->Coumadin bridge.  . Severe mitral stenosis by prior echocardiogram     last echo 07/2012  . Aortic stenosis   . Symptomatic bradycardia     s/p pacer  . Depression   . HSV (herpes simplex virus) infection   . Anemia   . PAD (peripheral artery disease) (HCC)     a. s/p RLE angioplasty with [name]. b. (by [name])  L popliteral artery orbital atherectomy and balloon angioplasty & L SFA orbital atherectomy and balloon angioplasty 11/30/12.  [name] Hyperparathyroidism, primary  [facility])     s/p parathyroidectomy   Past Surgical History  Procedure Laterality Date  . Insert / replace / remove pacemaker    . Kidney transplant  December 1999  . Abdominal angiogram  11/30/2012    ABDOMINAL AORTIC ANGIOGRAM   DR ARDIA  . Dialysis fistula creation    . Abdominal aortagram N/A 11/30/2012    Procedure: ABDOMINAL [name];  Surgeon: [name], MD;  Location: [facility] CATH LAB;  Service: Cardiovascular;  Laterality: N/A;   Family History  Problem Relation Age of Onset  . Adopted: Yes  . Kidney disease Brother     died from  . Kidney disease Sister     died from  . Heart attack Neg Hx   . Stroke Neg Hx    Social History  Substance Use Topics  . Smoking status: Former Games developer  . Smokeless tobacco: Never Used  . Alcohol Use: No   OB History    No data available     Review of Systems  Constitutional: Negative for fever and chills.  HENT: Negative for congestion and rhinorrhea.   Eyes: Negative for redness and visual disturbance.  Respiratory: Negative for shortness of breath and wheezing.   Cardiovascular: Negative for chest pain and palpitations.  Gastrointestinal: Negative for nausea and vomiting.  Genitourinary: Negative for dysuria and urgency.  Musculoskeletal: Negative for myalgias and arthralgias.  Skin: Positive for wound. Negative for pallor.  Neurological: Negative for dizziness and headaches.  Allergies  Bactrim; Aspirin; Codeine; Penicillins; and Latex  Home Medications   Prior to Admission medications   Medication Sig Start Date End Date Taking? Authorizing Provider  acetaminophen (TYLENOL) 500 MG tablet Take 500 mg by mouth every 6 (six) hours as needed. For pain    Historical Provider, MD  albuterol (PROVENTIL) (2.5 MG/3ML) 0.083% nebulizer solution Take 2.5 mg by nebulization every 6 (six) hours as needed for wheezing or shortness of breath.    Historical Provider, MD  amLODipine (NORVASC) 5 MG tablet Take 1 tablet (5 mg total) by  mouth daily. 10/08/15   [name], MD  amoxicillin (AMOXIL) 500 MG capsule Take 2,000 mg by mouth once. before dental procedure 08/03/12   Historical Provider, MD  azelastine (OPTIVAR) 0.05 % ophthalmic solution 1 drop 2 (two) times daily.    Historical Provider, MD  azithromycin (ZITHROMAX) 250 MG tablet TAKE 2 TABLETS BY MOUTH TODAY, THEN TAKE 1 TABLET DAILY FOR 4 DAYS 08/21/15   Historical Provider, MD  b complex-vitamin c-folic acid (NEPHRO-VITE) 0.8 MG TABS Take 0.8 mg by mouth daily.      Historical Provider, MD  benzonatate (TESSALON) 100 MG capsule TAKE ONE CAPSULE BY MOUTH 3 TIMES A DAY AS NEEDED FOR COUGH FOR 7 DAYS 09/04/15   Historical Provider, MD  Calcium Carbonate-Vitamin D (CALTRATE 600+D) 600-400 MG-UNIT per tablet Take 1 tablet by mouth 2 (two) times daily.     Historical Provider, MD  cephALEXin (KEFLEX) 500 MG capsule 500 mg 4 (four) times daily. For ulcers on feet 10/23/15   Historical Provider, MD  clindamycin (CLEOCIN) 150 MG capsule Take 3 capsules (450 mg total) by mouth every 6 (six) hours. 11/10/15   Melene Plan, DO  cycloSPORINE modified (NEORAL/GENGRAF) 25 MG capsule Take 25 mg by mouth 2 (two) times daily.      Historical Provider, MD  digoxin (LANOXIN) 0.125 MG tablet Take by mouth. 05/27/11   Historical Provider, MD  ergocalciferol (VITAMIN D2) 50000 UNITS capsule Take 50,000 Units by mouth every 30 (thirty) days. First of the month    Historical Provider, MD  fluticasone (FLONASE) 50 MCG/ACT nasal spray Place 2 sprays into the nose daily.    Historical Provider, MD  furosemide (LASIX) 20 MG tablet TAKE 1 TABLET BY MOUTH EVERY OTHER DAY 10/04/15   [name], MD  hydroxypropyl methylcellulose (ISOPTO TEARS) 2.5 % ophthalmic solution Place 1 drop into both eyes 4 (four) times daily as needed (dry eyes).    Historical Provider, MD  insulin detemir (LEVEMIR) 100 UNIT/ML injection Inject 10 Units into the skin at bedtime.    Historical Provider, MD  insulin NPH-regular  Human (NOVOLIN 70/30) (70-30) 100 UNIT/ML injection Inject into the skin. 05/27/11   Historical Provider, MD  Iron 66 MG TABS Take 1 tablet by mouth daily.    Historical Provider, MD  iron polysaccharides (NIFEREX) 150 MG capsule Take 150 mg by mouth. 06/03/07   Historical Provider, MD  lidocaine (XYLOCAINE) 5 % ointment Apply 1 application topically as needed. For foot pain 11/04/15   Asencion Islam, DPM  loratadine (CLARITIN) 10 MG tablet Take 10 mg by mouth daily.    Historical Provider, MD  metoprolol (LOPRESSOR) 100 MG tablet TAKE 1 TABLET BY MOUTH TWICE A DAY 10/31/15   [name], MD  mupirocin cream (BACTROBAN) 2 % APPLY TO AFFECTED AREA 3 TIMES A DAY FOR 10 DAYS 08/21/15   Historical Provider, MD  mycophenolate (CELLCEPT) 250 MG capsule Take 500 mg by mouth 2 (  two) times daily.     Historical Provider, MD  NOVOLOG FLEXPEN 100 UNIT/ML FlexPen USE AS DIRECTED PER SLIDING SCALE 30 MINUTES BEFORE MEALS 08/21/15   Historical Provider, MD  ONE TOUCH ULTRA TEST test strip TEST BLOOD SUGAR 3 TIMES A DAY (E11.9) 08/15/15   Historical Provider, MD  predniSONE (DELTASONE) 5 MG tablet Take 5 mg by mouth daily.      Historical Provider, MD  sitaGLIPtin (JANUVIA) 100 MG tablet Take 100 mg by mouth daily.      Historical Provider, MD  valACYclovir (VALTREX) 500 MG tablet Take 500 mg by mouth 3 (three) times a week. Only on Monday, Wednesday and Friday    Historical Provider, MD  warfarin (COUMADIN) 2.5 MG tablet TAKE AS DIRECTED BY ANTICOAGULATION CLINIC 04/22/15   Hillis Range, MD   BP 143/82 mmHg  Pulse 53  Temp(Src) 97.5 F (36.4 C) (Oral)  Resp 22  SpO2 100% Physical Exam  Constitutional: She is oriented to person, place, and time. She appears well-developed and well-nourished. No distress.  HENT:  Head: Normocephalic and atraumatic.  Eyes: EOM are normal. Pupils are equal, round, and reactive to light.  Neck: Normal range of motion. Neck supple.  Cardiovascular: Normal rate and regular  rhythm.  Exam reveals no gallop and no friction rub.   No murmur heard. Pulmonary/Chest: Effort normal. She has no wheezes. She has no rales.  Abdominal: Soft. She exhibits no distension. There is no tenderness.  Musculoskeletal: She exhibits no edema or tenderness.  Neurological: She is alert and oriented to person, place, and time.  Skin: Skin is warm and dry. She is not diaphoretic.     Psychiatric: She has a normal mood and affect. Her behavior is normal.  Nursing note and vitals reviewed.   ED Course  Procedures (including critical care time) Labs Review Labs Reviewed - No data to display  Imaging Review No results found. I have personally reviewed and evaluated these images and lab results as part of my medical decision-making.   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 Has some purulent drainage will start her back on antibiotic. I feel that osteomyelitis is unlikely.  Have her follow-up with her podiatrist tomorrow.   I have discussed the diagnosis/risks/treatment options with the patient and family and believe the pt to be eligible for discharge home to follow-up with Podiatrist. We also discussed returning to the ED immediately if new or worsening sx occur. We discussed the sx which are most concerning (e.g., sudden worsening pain, fever, inability to tolerate by mouth) that necessitate immediate return. Medications administered to the patient during their visit and any new prescriptions provided to the patient are listed below.  Medications given during this visit Medications - No data to display  Discharge Medication List as of 11/10/2015  6:43 PM    START taking these medications   Details  clindamycin (CLEOCIN) 150 MG capsule Take 3 capsules (450 mg total) by mouth every 6 (six) hours., Starting 11/10/2015, Until  Discontinued, Print        The patient appears reasonably screen and/or stabilized for discharge and I doubt any other medical condition or other [facility] requiring further screening, evaluation, or treatment in the ED at this time prior to discharge.      Melene Plan, DO  11/10/15 2319  Melene Plan, DO 11/10/15 2319

## 2015-11-11 ENCOUNTER — Telehealth: Payer: Self-pay | Admitting: *Deleted

## 2015-11-11 NOTE — Telephone Encounter (Addendum)
Pt states Dr. Marylene Land had wanted her to call if her foot got worse, she went to the ER yesterday and was put back on an antibiotic.  Left message instructing pt to make an appt and continue the orders from the hospital until she was seen by Dr. Marylene Land.  Pt left another message 5 minutes after the 1st message at 0959am, 2nd message said she had Clindamycin, and she wanted to know if she still needed to come in, the hospital told her to make an appt.  I called pt and told her to make an appt and transferred to schedulers.

## 2015-11-11 NOTE — Telephone Encounter (Signed)
Billie - [facility] states pt cancelled tomorrow's appt to be seen by Dr. Marylene Land, because the ER told her to see her foot doctor after her visit.  Willaim Sheng states she is rescheduling pt for 11/13/2015 with Dr. Allyson Sabal.

## 2015-11-12 ENCOUNTER — Encounter: Payer: Self-pay | Admitting: Cardiovascular Disease

## 2015-11-12 ENCOUNTER — Ambulatory Visit: Payer: Medicare Other | Admitting: Cardiovascular Disease

## 2015-11-12 ENCOUNTER — Ambulatory Visit: Payer: Medicare Other | Admitting: Sports Medicine

## 2015-11-12 ENCOUNTER — Ambulatory Visit (INDEPENDENT_AMBULATORY_CARE_PROVIDER_SITE_OTHER): Payer: Medicare Other | Admitting: Cardiovascular Disease

## 2015-11-12 VITALS — BP 128/80 | HR 57 | Ht 60.0 in | Wt 91.0 lb

## 2015-11-12 DIAGNOSIS — I050 Rheumatic mitral stenosis: Secondary | ICD-10-CM | POA: Diagnosis not present

## 2015-11-12 DIAGNOSIS — I739 Peripheral vascular disease, unspecified: Secondary | ICD-10-CM | POA: Diagnosis not present

## 2015-11-12 MED ORDER — TRAMADOL HCL 50 MG PO TABS: 50.0000 mg | ORAL_TABLET | Freq: Three times a day (TID) | ORAL | Status: DC | PRN

## 2015-11-12 MED ORDER — CLOPIDOGREL BISULFATE 75 MG PO TABS: 75.0000 mg | ORAL_TABLET | Freq: Every day | ORAL | Status: DC

## 2015-11-12 NOTE — Progress Notes (Signed)
Cardiology Office Note   Date:  11/15/2015   ID:  [name] [name], DOB 1946/05/06, MRN [id number]  PCP:  [name], NP  Cardiologist:  Dr. Excell Seltzer.   No chief complaint on file.     History of Present Illness: [name] [name] is a 70 y.o. female who was referred by Dr. Excell Seltzer for evaluation of critical limb ischemia.  She has known history of permanent atrial fibrillation, renal failure status post kidney transplant, mitral stenosis, and lower extremity peripheral arterial disease. She was seen by me in 2014 for severe left calf claudication associated with ulceration involving the left foot.  She underwent angiography in 11/2012 which confirmed severe calcified disease in the left popliteal artery as well as the SFA. She underwent a complex procedure with atherectomy of the left popliteal artery and the left SFA. There was also one-vessel runoff below the knee with only patent anterior tibial artery which had diffuse 60% disease. She has done well up until recently when she developed recurrent ulceration on left 2 small toes. She was seen by podiatry and has been placed on antibiotics. There is some mild drainage. She also has an ingrown toenail on the right big toe with some ulceration in that area. She is having breast pain affecting the left foot. Noninvasive vascular evaluation showed that the ABIs could not be calculated due to noncompressible vessels. There was possible focal left SFA occlusion and midright SFA occlusion.  Past Medical History  Diagnosis Date  . Diabetes mellitus   . Hypertension   . Chronic kidney disease     s/p renal transplant;  follows with [name]  . Stroke [facility]) December 2011    left MCA distribution  . Permanent atrial fibrillation (HCC)     coumadin clinic at [facility]. Has previously required Lovenox->Coumadin bridge.  . Severe mitral stenosis by prior echocardiogram     last echo 07/2012  . Aortic stenosis   .  Symptomatic bradycardia     s/p pacer  . Depression   . HSV (herpes simplex virus) infection   . Anemia   . PAD (peripheral artery disease) (HCC)     a. s/p RLE angioplasty with [name]. b. (by [name])  L popliteral artery orbital atherectomy and balloon angioplasty & L SFA orbital atherectomy and balloon angioplasty 11/30/12.  [name] Hyperparathyroidism, primary [facility])     s/p parathyroidectomy    Past Surgical History  Procedure Laterality Date  . Insert / replace / remove pacemaker    . Kidney transplant  December 1999  . Abdominal angiogram  11/30/2012    ABDOMINAL AORTIC ANGIOGRAM   DR ARDIA  . Dialysis fistula creation    . Abdominal aortagram N/A 11/30/2012    Procedure: ABDOMINAL [name];  Surgeon: [name], MD;  Location: [facility] CATH LAB;  Service: Cardiovascular;  Laterality: N/A;     Current Outpatient Prescriptions  Medication Sig Dispense Refill  . acetaminophen (TYLENOL) 500 MG tablet Take 500 mg by mouth every 6 (six) hours as needed. For pain    . albuterol (PROVENTIL) (2.5 MG/3ML) 0.083% nebulizer solution Take 2.5 mg by nebulization every 6 (six) hours as needed for wheezing or shortness of breath.    [name] amLODipine (NORVASC) 5 MG tablet Take 1 tablet (5 mg total) by mouth daily. 90 tablet 0  . amoxicillin (AMOXIL) 500 MG capsule Take 2,000 mg by mouth once. before dental procedure    . azelastine (OPTIVAR) 0.05 % ophthalmic  solution 1 drop 2 (two) times daily.    [name] b complex-vitamin c-folic acid (NEPHRO-VITE) 0.8 MG TABS Take 0.8 mg by mouth daily.      . benzonatate (TESSALON) 100 MG capsule TAKE ONE CAPSULE BY MOUTH 3 TIMES A DAY AS NEEDED FOR COUGH FOR 7 DAYS  0  . Calcium Carbonate-Vitamin D (CALTRATE 600+D) 600-400 MG-UNIT per tablet Take 1 tablet by mouth 2 (two) times daily.     . clindamycin (CLEOCIN) 150 MG capsule Take 3 capsules (450 mg total) by mouth every 6 (six) hours. 90 capsule 0  . cycloSPORINE modified (NEORAL/GENGRAF) 25 MG capsule Take 25  mg by mouth 2 (two) times daily.      . digoxin (LANOXIN) 0.125 MG tablet Take by mouth.    . ergocalciferol (VITAMIN D2) 50000 UNITS capsule Take 50,000 Units by mouth every 30 (thirty) days. First of the month    . fluticasone (FLONASE) 50 MCG/ACT nasal spray Place 2 sprays into the nose daily.    . furosemide (LASIX) 20 MG tablet TAKE 1 TABLET BY MOUTH EVERY OTHER DAY 30 tablet 0  . hydroxypropyl methylcellulose (ISOPTO TEARS) 2.5 % ophthalmic solution Place 1 drop into both eyes 4 (four) times daily as needed (dry eyes).    . insulin detemir (LEVEMIR) 100 UNIT/ML injection Inject 10 Units into the skin at bedtime.    . Iron 66 MG TABS Take 1 tablet by mouth daily.    . iron polysaccharides (NIFEREX) 150 MG capsule Take 150 mg by mouth.    . lidocaine (XYLOCAINE) 5 % ointment Apply 1 application topically as needed. For foot pain 35.44 g 0  . loratadine (CLARITIN) 10 MG tablet Take 10 mg by mouth daily.    . metoprolol (LOPRESSOR) 100 MG tablet TAKE 1 TABLET BY MOUTH TWICE A DAY 60 tablet 0  . mupirocin cream (BACTROBAN) 2 % APPLY TO AFFECTED AREA 3 TIMES A DAY FOR 10 DAYS  0  . mycophenolate (CELLCEPT) 250 MG capsule Take 500 mg by mouth 2 (two) times daily.     [name] NOVOLOG FLEXPEN 100 UNIT/ML FlexPen USE AS DIRECTED PER SLIDING SCALE 30 MINUTES BEFORE MEALS  3  . ONE TOUCH ULTRA TEST test strip TEST BLOOD SUGAR 3 TIMES A DAY (E11.9)  3  . predniSONE (DELTASONE) 5 MG tablet Take 5 mg by mouth daily.      . sitaGLIPtin (JANUVIA) 100 MG tablet Take 100 mg by mouth daily.      . valACYclovir (VALTREX) 500 MG tablet Take 500 mg by mouth 3 (three) times a week. Only on Monday, Wednesday and Friday    . warfarin (COUMADIN) 2.5 MG tablet TAKE AS DIRECTED BY ANTICOAGULATION CLINIC 30 tablet 3  . clopidogrel (PLAVIX) 75 MG tablet Take 1 tablet (75 mg total) by mouth daily. 30 tablet 11  . enoxaparin (LOVENOX) 40 MG/0.4ML injection Inject 0.4 mLs (40 mg total) into the skin every 12 (twelve) hours. 20  Syringe 0  . traMADol (ULTRAM) 50 MG tablet Take 1 tablet (50 mg total) by mouth every 8 (eight) hours as needed for moderate pain. 60 tablet 0   No current facility-administered medications for this visit.    Allergies:   Bactrim; Aspirin; Codeine; Penicillins; and Latex    Social History:  The patient  reports that she has quit smoking. She has never used smokeless tobacco. She reports that she does not drink alcohol or use illicit drugs.   Family History:  The patient's family history  includes Kidney disease in her brother and sister. There is no history of Heart attack or Stroke. She was adopted.    ROS:  Please see the history of present illness.   Otherwise, review of systems are positive for none.   All other systems are reviewed and negative.    PHYSICAL EXAM: VS:  BP 128/80 mmHg  Pulse 57  Ht 5' (1.524 m)  Wt 91 lb (41.277 kg)  BMI 17.77 kg/m2  SpO2 99% , BMI Body mass index is 17.77 kg/(m^2). GEN: Well nourished, well developed, in no acute distress HEENT: normal Neck: no JVD, carotid bruits, or masses Cardiac: RRR; no  rubs, or gallops,no edema . There is a 2/6 systolic murmur at the left sternal border Respiratory:  clear to auscultation bilaterally, normal work of breathing GI: soft, nontender, nondistended, + BS MS: no deformity or atrophy Skin: warm and dry, no rash Neuro:  Strength and sensation are intact Psych: euthymic mood, full affect Vascular: Femoral pulses are normal. Distal pulses are not palpable. Small ulceration involving the left to small dose.  EKG:  EKG is not ordered today.    Recent Labs: 11/13/2015: BUN 23; Creat 1.17*; Hemoglobin 13.9; Platelets 208; Potassium 4.1; Sodium 140    Lipid Panel    Component Value Date/Time   CHOL * 08/19/2009 0547    221        ATP III CLASSIFICATION:  <200     mg/dL   Desirable  998-338  mg/dL   Borderline High  >=250    mg/dL   High          TRIG 87 08/19/2009 0547   HDL 113 RESULTS CONFIRMED BY  MANUAL DILUTION 08/19/2009 0547   CHOLHDL 2.0 08/19/2009 0547   VLDL 17 08/19/2009 0547   LDLCALC  08/19/2009 0547    91        Total Cholesterol/HDL:CHD Risk Coronary Heart Disease Risk Table                     Men   Women  1/2 Average Risk   3.4   3.3  Average Risk       5.0   4.4  2 X Average Risk   9.6   7.1  3 X Average Risk  23.4   11.0        Use the calculated Patient Ratio above and the CHD Risk Table to determine the patient's CHD Risk.        ATP III CLASSIFICATION (LDL):  <100     mg/dL   Optimal  539-767  mg/dL   Near or Above                    Optimal  130-159  mg/dL   Borderline  341-937  mg/dL   High  >902     mg/dL   Very High      Wt Readings from Last 3 Encounters:  11/12/15 91 lb (41.277 kg)  11/06/15 92 lb (41.731 kg)  12/31/14 92 lb (41.731 kg)         ASSESSMENT AND PLAN:  1.  Peripheral arterial disease with critical limb ischemia involving the left lower extremity. The right big toe is not significantly affected and most likely will be needed. However, the patient is at high risk for limb loss on the left side. Thus, I recommend proceeding with urgent angiography and possible endovascular intervention. I discussed risks and benefits. She will require  interruption of warfarin with Lovenox bridging. In the meanwhile, her to start Plavix 75 mg once daily. She is status post kidney transplant and we have to be cautious with contrast. I am planning to focus on the left leg only. Right lower extremity angiography can be considered at a later date if needed. Most likely I wills observe her overnight after the procedure for hydration.  2. Chronic atrial fibrillation: Ventricular rate is well controlled. 3. Severe mitral stenosis: She appears to be asymptomatic.    Disposition:   FU with me in 1 month  Signed,  Lorine Bears, MD  11/15/2015 6:30 PM    [facility]

## 2015-11-12 NOTE — Patient Instructions (Signed)
Medication Instructions:  Your physician has recommended you make the following change in your medication:  1. START Plavix (clopidogrel)  take one tablet by mouth daily 2. START Tramadol  take one tablet by mouth every 8 hours as needed for pain  Labwork: Your physician recommends that you return for lab work when you are seen at the [facility] for Lovenox bridging. (Anticoagulation Clinic will contact you with these arrangements)  Testing/Procedures: Your physician has requested that you have a peripheral vascular angiogram. This exam is performed at the hospital. During this exam IV contrast is used to look at arterial blood flow. Please review the information sheet given for details.  Follow-Up: Your physician recommends that you schedule a follow-up appointment in: 2 WEEKS with Dr Kirke Corin   Any Other Special Instructions Will Be Listed Below (If Applicable).     If you need a refill on your cardiac medications before your next appointment, please call your pharmacy.

## 2015-11-13 ENCOUNTER — Ambulatory Visit (INDEPENDENT_AMBULATORY_CARE_PROVIDER_SITE_OTHER): Payer: Medicare Other | Admitting: Pharmacist

## 2015-11-13 ENCOUNTER — Other Ambulatory Visit (INDEPENDENT_AMBULATORY_CARE_PROVIDER_SITE_OTHER): Payer: Medicare Other | Admitting: *Deleted

## 2015-11-13 DIAGNOSIS — I4891 Unspecified atrial fibrillation: Secondary | ICD-10-CM

## 2015-11-13 DIAGNOSIS — Z5181 Encounter for therapeutic drug level monitoring: Secondary | ICD-10-CM

## 2015-11-13 DIAGNOSIS — I050 Rheumatic mitral stenosis: Secondary | ICD-10-CM

## 2015-11-13 DIAGNOSIS — I739 Peripheral vascular disease, unspecified: Secondary | ICD-10-CM

## 2015-11-13 LAB — CBC
HCT: 41.1 % (ref 36.0–46.0)
HEMOGLOBIN: 13.9 g/dL (ref 12.0–15.0)
MCH: 32.4 pg (ref 26.0–34.0)
MCHC: 33.8 g/dL (ref 30.0–36.0)
MCV: 95.8 fL (ref 78.0–100.0)
MPV: 9.9 fL (ref 8.6–12.4)
Platelets: 208 10*3/uL (ref 150–400)
RBC: 4.29 MIL/uL (ref 3.87–5.11)
RDW: 15.3 % (ref 11.5–15.5)
WBC: 5.8 10*3/uL (ref 4.0–10.5)

## 2015-11-13 LAB — BASIC METABOLIC PANEL
BUN: 23 mg/dL (ref 7–25)
CALCIUM: 9 mg/dL (ref 8.6–10.4)
CHLORIDE: 103 mmol/L (ref 98–110)
CO2: 24 mmol/L (ref 20–31)
Creat: 1.17 mg/dL — ABNORMAL HIGH (ref 0.50–0.99)
Glucose, Bld: 100 mg/dL — ABNORMAL HIGH (ref 65–99)
Potassium: 4.1 mmol/L (ref 3.5–5.3)
SODIUM: 140 mmol/L (ref 135–146)

## 2015-11-13 LAB — POCT INR: INR: 2.6

## 2015-11-13 MED ORDER — ENOXAPARIN SODIUM 40 MG/0.4ML ~~LOC~~ SOLN: 40.0000 mg | Freq: Two times a day (BID) | SUBCUTANEOUS | Status: DC

## 2015-11-13 NOTE — Patient Instructions (Signed)
3/9: Last dose of Coumadin  3/10: No coumadin or Lovenox   3/11: Inject Lovenox 40mg  in the fatty abdominal tissue at least 2 inches from the belly button twice a day about 12 hours apart, 8am and 8pm rotate sites. No Coumadin  3/12: Inject Lovenox in the fatty tissue every 12 hours, 8am and 8pm. No Coumadin  3/13: Inject Lovenox in the fatty tissue every 12 hours, 8am and 8pm. No Coumadin  3/14: Inject Lovenox in the fatty tissue in the morning at 8 am (No PM dose). No Coumadin  3/15: Procedure Day - No Lovenox - Resume Coumadin in the evening or as directed by doctor (take an extra half tablet with usual dose for 2 days then resume normal dose)  3/16: Resume Lovenox inject in the fatty tissue every 12 hours and take coumadin   3/17-3/19: Inject Lovenox in the fatty tissue every 12 hours and take coumadin  3/20: Coumadin appt to check INR

## 2015-11-17 ENCOUNTER — Emergency Department (HOSPITAL_COMMUNITY): Payer: Medicare Other

## 2015-11-17 ENCOUNTER — Encounter (HOSPITAL_COMMUNITY): Payer: Self-pay | Admitting: Emergency Medicine

## 2015-11-17 ENCOUNTER — Emergency Department (HOSPITAL_COMMUNITY)
Admission: EM | Admit: 2015-11-17 | Discharge: 2015-11-18 | Disposition: A | Discharge status: Home / Self Care | Payer: Medicare Other | Source: Home / Self Care | Attending: Emergency Medicine | Admitting: Emergency Medicine

## 2015-11-17 DIAGNOSIS — Z8619 Personal history of other infectious and parasitic diseases: Secondary | ICD-10-CM | POA: Insufficient documentation

## 2015-11-17 DIAGNOSIS — S0990XA Unspecified injury of head, initial encounter: Secondary | ICD-10-CM

## 2015-11-17 DIAGNOSIS — Z87891 Personal history of nicotine dependence: Secondary | ICD-10-CM

## 2015-11-17 DIAGNOSIS — Z7984 Long term (current) use of oral hypoglycemic drugs: Secondary | ICD-10-CM

## 2015-11-17 DIAGNOSIS — Z950 Presence of cardiac pacemaker: Secondary | ICD-10-CM | POA: Insufficient documentation

## 2015-11-17 DIAGNOSIS — D649 Anemia, unspecified: Secondary | ICD-10-CM | POA: Insufficient documentation

## 2015-11-17 DIAGNOSIS — Y998 Other external cause status: Secondary | ICD-10-CM

## 2015-11-17 DIAGNOSIS — Z8673 Personal history of transient ischemic attack (TIA), and cerebral infarction without residual deficits: Secondary | ICD-10-CM | POA: Insufficient documentation

## 2015-11-17 DIAGNOSIS — Z79899 Other long term (current) drug therapy: Secondary | ICD-10-CM | POA: Insufficient documentation

## 2015-11-17 DIAGNOSIS — I129 Hypertensive chronic kidney disease with stage 1 through stage 4 chronic kidney disease, or unspecified chronic kidney disease: Secondary | ICD-10-CM | POA: Insufficient documentation

## 2015-11-17 DIAGNOSIS — F329 Major depressive disorder, single episode, unspecified: Secondary | ICD-10-CM | POA: Insufficient documentation

## 2015-11-17 DIAGNOSIS — Y9389 Activity, other specified: Secondary | ICD-10-CM | POA: Insufficient documentation

## 2015-11-17 DIAGNOSIS — Z7952 Long term (current) use of systemic steroids: Secondary | ICD-10-CM | POA: Insufficient documentation

## 2015-11-17 DIAGNOSIS — N189 Chronic kidney disease, unspecified: Secondary | ICD-10-CM

## 2015-11-17 DIAGNOSIS — E119 Type 2 diabetes mellitus without complications: Secondary | ICD-10-CM | POA: Insufficient documentation

## 2015-11-17 DIAGNOSIS — Z880 Allergy status to penicillin: Secondary | ICD-10-CM | POA: Insufficient documentation

## 2015-11-17 DIAGNOSIS — W01198A Fall on same level from slipping, tripping and stumbling with subsequent striking against other object, initial encounter: Secondary | ICD-10-CM

## 2015-11-17 DIAGNOSIS — Z794 Long term (current) use of insulin: Secondary | ICD-10-CM

## 2015-11-17 DIAGNOSIS — Z91040 Latex allergy status: Secondary | ICD-10-CM

## 2015-11-17 DIAGNOSIS — I739 Peripheral vascular disease, unspecified: Secondary | ICD-10-CM | POA: Insufficient documentation

## 2015-11-17 DIAGNOSIS — Z7951 Long term (current) use of inhaled steroids: Secondary | ICD-10-CM | POA: Insufficient documentation

## 2015-11-17 DIAGNOSIS — W19XXXA Unspecified fall, initial encounter: Secondary | ICD-10-CM

## 2015-11-17 DIAGNOSIS — S20221A Contusion of right back wall of thorax, initial encounter: Secondary | ICD-10-CM

## 2015-11-17 DIAGNOSIS — R011 Cardiac murmur, unspecified: Secondary | ICD-10-CM

## 2015-11-17 DIAGNOSIS — Z940 Kidney transplant status: Secondary | ICD-10-CM | POA: Insufficient documentation

## 2015-11-17 DIAGNOSIS — S99922A Unspecified injury of left foot, initial encounter: Secondary | ICD-10-CM | POA: Insufficient documentation

## 2015-11-17 DIAGNOSIS — I4891 Unspecified atrial fibrillation: Secondary | ICD-10-CM | POA: Insufficient documentation

## 2015-11-17 DIAGNOSIS — Y9289 Other specified places as the place of occurrence of the external cause: Secondary | ICD-10-CM | POA: Insufficient documentation

## 2015-11-17 DIAGNOSIS — E1151 Type 2 diabetes mellitus with diabetic peripheral angiopathy without gangrene: Secondary | ICD-10-CM | POA: Diagnosis not present

## 2015-11-17 LAB — CBG MONITORING, ED: GLUCOSE-CAPILLARY: 233 mg/dL — AB (ref 65–99)

## 2015-11-17 MED ORDER — HYDROCODONE-ACETAMINOPHEN 5-325 MG PO TABS: 1.0000 | ORAL_TABLET | ORAL | Status: DC | PRN

## 2015-11-17 MED ORDER — HYDROCODONE-ACETAMINOPHEN 5-325 MG PO TABS
1.0000 | ORAL_TABLET | Freq: Once | ORAL | Status: AC
  Administered 2015-11-17: 1 via ORAL
  Filled 2015-11-17: qty 1

## 2015-11-17 NOTE — ED Notes (Signed)
Pt called,no answer.

## 2015-11-17 NOTE — ED Notes (Addendum)
Patient with fall on Friday and again today. Patient reports she was administering a lovenox shot when she fell and the injection site began bleeding and swelled up. Patient c/o right sided pain and left headache. Patient is A&Ox4

## 2015-11-17 NOTE — ED Provider Notes (Signed)
CSN: [id number]     Arrival date & time 11/17/15  2020 History   First MD Initiated Contact with Patient 11/17/15 2158     Chief Complaint  Patient presents with  . Fall    on lovenox     (Consider location/radiation/quality/duration/timing/severity/associated sxs/prior Treatment) HPI [place] on water in bathroom. Trying to clean up. Reached for cane and fell, hitting head. No LOC. Some H/A and felt a little confused. Hit right side of body, pain from toes, all the way up her back. Gets Lovenox shots. Will be getting LE vascular surgery in 3 days. Has chronic severe pain feet and legs due to PVD. Past Medical History  Diagnosis Date  . Diabetes mellitus   . Hypertension   . Chronic kidney disease     s/p renal transplant;  follows with [name]  . Stroke [facility]) December 2011    left MCA distribution  . Permanent atrial fibrillation (HCC)     coumadin clinic at [facility]. Has previously required Lovenox->Coumadin bridge.  . Severe mitral stenosis by prior echocardiogram     last echo 07/2012  . Aortic stenosis   . Symptomatic bradycardia     s/p pacer  . Depression   . HSV (herpes simplex virus) infection   . Anemia   . PAD (peripheral artery disease) (HCC)     a. s/p RLE angioplasty with [name]. b. (by [name])  L popliteral artery orbital atherectomy and balloon angioplasty & L SFA orbital atherectomy and balloon angioplasty 11/30/12.  [name] Hyperparathyroidism, primary [facility])     s/p parathyroidectomy   Past Surgical History  Procedure Laterality Date  . Insert / replace / remove pacemaker    . Kidney transplant  December 1999  . Abdominal angiogram  11/30/2012    ABDOMINAL AORTIC ANGIOGRAM   DR ARDIA  . Dialysis fistula creation    . Abdominal aortagram N/A 11/30/2012    Procedure: ABDOMINAL [name];  Surgeon: [name], MD;  Location: [facility] CATH LAB;  Service: Cardiovascular;  Laterality: N/A;   Family History  Problem Relation Age of Onset  .  Adopted: Yes  . Kidney disease Brother     died from  . Kidney disease Sister     died from  . Heart attack Neg Hx   . Stroke Neg Hx    Social History  Substance Use Topics  . Smoking status: Former Games developer  . Smokeless tobacco: Never Used  . Alcohol Use: No   OB History    No data available     Review of Systems  10 Systems reviewed and are negative for acute change except as noted in the HPI.  Allergies  Bactrim; Aspirin; Codeine; Latex; and Penicillins  Home Medications   Prior to Admission medications   Medication Sig Start Date End Date Taking? Authorizing Provider  acetaminophen (TYLENOL) 500 MG tablet Take 500 mg by mouth every 6 (six) hours as needed. For pain   Yes Historical Provider, MD  albuterol (PROVENTIL) (2.5 MG/3ML) 0.083% nebulizer solution Take 2.5 mg by nebulization every 6 (six) hours as needed for wheezing or shortness of breath.   Yes Historical Provider, MD  amLODipine (NORVASC) 5 MG tablet Take 1 tablet (5 mg total) by mouth daily. 10/08/15  Yes [name], MD  amoxicillin (AMOXIL) 500 MG capsule Take 2,000 mg by mouth once. before dental procedure 08/03/12  Yes Historical Provider, MD  azelastine (OPTIVAR) 0.05 % ophthalmic solution Place 1 drop into both eyes  2 (two) times daily as needed (for dry eyes).    Yes Historical Provider, MD  b complex-vitamin c-folic acid (NEPHRO-VITE) 0.8 MG TABS Take 1 tablet by mouth daily.    Yes Historical Provider, MD  Calcium Carbonate-Vitamin D (CALTRATE 600+D) 600-400 MG-UNIT per tablet Take 1 tablet by mouth 2 (two) times daily.    Yes Historical Provider, MD  clindamycin (CLEOCIN) 150 MG capsule Take 3 capsules (450 mg total) by mouth every 6 (six) hours. 11/10/15  Yes Melene Plan, DO  clopidogrel (PLAVIX) 75 MG tablet Take 1 tablet (75 mg total) by mouth daily. 11/12/15 11/11/16 Yes [name], MD  cycloSPORINE modified (NEORAL/GENGRAF) 25 MG capsule Take 25 mg by mouth 2 (two) times daily.     Yes Historical  Provider, MD  digoxin (LANOXIN) 0.125 MG tablet Take 0.125 mg by mouth daily.  05/27/11  Yes Historical Provider, MD  enoxaparin (LOVENOX) 40 MG/0.4ML injection Inject 0.4 mLs (40 mg total) into the skin every 12 (twelve) hours. 11/13/15  Yes [name], MD  ergocalciferol (VITAMIN D2) 50000 UNITS capsule Take 50,000 Units by mouth every 30 (thirty) days. First of the month   Yes Historical Provider, MD  fluticasone (FLONASE) 50 MCG/ACT nasal spray Place 2 sprays into the nose daily.   Yes Historical Provider, MD  furosemide (LASIX) 20 MG tablet TAKE 1 TABLET BY MOUTH EVERY OTHER DAY 10/04/15  Yes [name], MD  hydroxypropyl methylcellulose (ISOPTO TEARS) 2.5 % ophthalmic solution Place 1 drop into both eyes 4 (four) times daily as needed (dry eyes).   Yes Historical Provider, MD  insulin detemir (LEVEMIR) 100 UNIT/ML injection Inject 10 Units into the skin at bedtime.   Yes Historical Provider, MD  Iron 66 MG TABS Take 1 tablet by mouth daily.   Yes Historical Provider, MD  iron polysaccharides (NIFEREX) 150 MG capsule Take 150 mg by mouth daily.  06/03/07  Yes Historical Provider, MD  loratadine (CLARITIN) 10 MG tablet Take 10 mg by mouth daily as needed for allergies.    Yes Historical Provider, MD  metoprolol (LOPRESSOR) 100 MG tablet TAKE 1 TABLET BY MOUTH TWICE A DAY 10/31/15  Yes [name], MD  mycophenolate (CELLCEPT) 250 MG capsule Take 500 mg by mouth 2 (two) times daily.    Yes Historical Provider, MD  ONE TOUCH ULTRA TEST test strip TEST BLOOD SUGAR 3 TIMES A DAY (E11.9) 08/15/15  Yes Historical Provider, MD  predniSONE (DELTASONE) 5 MG tablet Take 5 mg by mouth daily.     Yes Historical Provider, MD  sitaGLIPtin (JANUVIA) 100 MG tablet Take 100 mg by mouth daily.     Yes Historical Provider, MD  traMADol (ULTRAM) 50 MG tablet Take 1 tablet (50 mg total) by mouth every 8 (eight) hours as needed for moderate pain. 11/12/15  Yes [name], MD  valACYclovir (VALTREX) 500 MG  tablet Take 500 mg by mouth every Monday, Wednesday, and Friday. Only on Monday, Wednesday and Friday   Yes Historical Provider, MD  HYDROcodone-acetaminophen (NORCO/VICODIN) 5-325 MG tablet Take 1 tablet by mouth every 4 (four) hours as needed for moderate pain or severe pain. 11/17/15   Arby Barrette, MD  lidocaine (XYLOCAINE) 5 % ointment Apply 1 application topically as needed. For foot pain Patient not taking: Reported on 11/17/2015 11/04/15   Asencion Islam, DPM  warfarin (COUMADIN) 2.5 MG tablet TAKE AS DIRECTED BY ANTICOAGULATION CLINIC Patient not taking: Reported on 11/17/2015 04/22/15   Hillis Range, MD   BP  150/84 mmHg  Pulse 58  Temp(Src) 98.1 F (36.7 C) (Oral)  Resp 16  SpO2 99% Physical Exam  Constitutional: She is oriented to person, place, and time.  Patient is alert and nontoxic. She is a slender female. Acute distress.  HENT:  Head: Normocephalic and atraumatic.  Right Ear: External ear normal.  Left Ear: External ear normal.  Nose: Nose normal.  Mouth/Throat: Oropharynx is clear and moist.  Eyes: EOM are normal. Pupils are equal, round, and reactive to light.  Neck: Neck supple.  No focal cervical spine tenderness to palpation  Cardiovascular:  2 to 3/6 systolic ejection murmur. Intermittent ectopy.  Pulmonary/Chest: Effort normal.  Soft breath sounds throughout the lung fields. No gross wheeze rhonchi rail. Patient endorses tenderness to palpation on the right lateral, posterior chest wall at approximately ribs 5 through 8. No palpable abnormality, no contusions abrasions visible.  Abdominal: Soft. Bowel sounds are normal. She exhibits no distension.  Patient gets her subcutaneous Lovenox shots in the abdomen. She has a well-circumscribed hematoma in the subcutaneous tissue of the low, mid abdomen. She reports tenderness with this. Several other round areas of ecchymosis consistent with her shots. The abdomen however is soft without guarding.  Musculoskeletal: Normal  range of motion. She exhibits no edema.  Patient has slight erythema of the toes on the left foot. Toes are exquisitely tender to light touch.  Neurological: She is alert and oriented to person, place, and time. Coordination normal.  Skin: Skin is warm and dry.  Psychiatric: She has a normal mood and affect.    ED Course  Procedures (including critical care time) Labs Review Labs Reviewed  CBG MONITORING, ED - Abnormal; Notable for the following:    Glucose-Capillary 233 (*)    All other components within normal limits    Imaging Review Dg Chest 2 View  11/17/2015  CLINICAL DATA:  Fall with right-sided pain.  Initial encounter. EXAM: CHEST  2 VIEW COMPARISON:  11/04/2010 FINDINGS: Chronic cardiomegaly, likely progressed. There is a single chamber pacer lead into the right ventricle from the right. Negative aortic and hilar contours. There is no edema, consolidation, effusion, or pneumothorax. There is are innumerable nodular densities bilaterally which appear calcified and are new or progressed. Diffuse vascular calcification. Patient has history of primary hyperparathyroidism, accounting for surgical clips at the thoracic inlet. IMPRESSION: 1. No acute or posttraumatic finding. 2. Nodular pulmonary calcification which has developed/progressed since 2012. Favor metastatic pulmonary calcification in this patient with primary hyperparathyroidism. Electronically Signed   By: Marnee Spring M.D.   On: 11/17/2015 23:37   Ct Head Wo Contrast  11/17/2015  CLINICAL DATA:  Multiple recent falls. Recent Lovenox and ejection. LEFT headache. RIGHT head pain. No loss of consciousness. History of chronic kidney disease, diabetes, hypertension. EXAM: CT HEAD WITHOUT CONTRAST TECHNIQUE: Contiguous axial images were obtained from the base of the skull through the vertex without intravenous contrast. COMPARISON:  MRI of the head August 18, 2009 FINDINGS: Small area RIGHT frontoparietal encephalomalacia  corresponding to old infarct. Small old bilateral cerebellar infarcts. No intraparenchymal hemorrhage, mass effect, midline shift or acute large vascular territory infarcts. Ventricles and sulci are overall normal for patient's age. Very mild chronic small vessel ischemic disease, less than expected for age. No abnormal extra-axial fluid collections. Basal cisterns are patent. Extensive vascular and dural calcifications. 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 Severe calcific atherosclerosis of the aortoiliac vessels. LEFT maxillary mucosal retention cyst without paranasal sinus air-fluid levels. Moderate temporomandibular  osteoarthrosis. Ocular globes L orbital contents are non-suspicious. IMPRESSION: No acute intracranial process. Old small RIGHT MCA territory infarct and old small cerebellar infarcts. Minimal chronic small vessel ischemic disease. Electronically Signed   By: Awilda Metro M.D.   On: 11/17/2015 23:41   I have personally reviewed and evaluated these images and lab results as part of my medical decision-making.   EKG Interpretation None      MDM   Final diagnoses:  Fall, initial encounter  Head injury, initial encounter  Contusion of right back wall of thorax, initial encounter  Peripheral vascular disease (HCC)   Patient is alert and well in appearance. CT does not show intracranial injury. Neurologic is intact. Patient has some chest wall tenderness without difficulty breathing. Chest x-ray does not show acute injury. Has chronic pain in her feet due to peripheral vascular disease. No evidence of acute injury. Patient is discharged for follow-up with her family doctor early this week. I can provide for pain control.    Arby Barrette, MD 11/18/15 0000

## 2015-11-17 NOTE — Discharge Instructions (Signed)
Head Injury, Adult °You have received a head injury. It does not appear serious at this time. Headaches and vomiting are common following head injury. It should be easy to awaken from sleeping. Sometimes it is necessary for you to stay in the emergency department for a while for observation. Sometimes admission to the hospital may be needed. After injuries such as yours, most problems occur within the first 24 hours, but side effects may occur up to 7-10 days after the injury. It is important for you to carefully monitor your condition and contact your health care provider or seek immediate medical care if there is a change in your condition. °WHAT ARE THE TYPES OF HEAD INJURIES? °Head injuries can be as minor as a bump. Some head injuries can be more severe. More severe head injuries include: °· A jarring injury to the brain (concussion). °· A bruise of the brain (contusion). This mean there is bleeding in the brain that can cause swelling. °· A cracked skull (skull fracture). °· Bleeding in the brain that collects, clots, and forms a bump (hematoma). °WHAT CAUSES A HEAD INJURY? °A serious head injury is most likely to happen to someone who is in a car wreck and is not wearing a seat belt. Other causes of major head injuries include bicycle or motorcycle accidents, sports injuries, and falls. °HOW ARE HEAD INJURIES DIAGNOSED? °A complete history of the event leading to the injury and your current symptoms will be helpful in diagnosing head injuries. Many times, pictures of the brain, such as CT or MRI are needed to see the extent of the injury. Often, an overnight hospital stay is necessary for observation.  °WHEN SHOULD I SEEK IMMEDIATE MEDICAL CARE?  °You should get help right away if: °· You have confusion or drowsiness. °· You feel sick to your stomach (nauseous) or have continued, forceful vomiting. °· You have dizziness or unsteadiness that is getting worse. °· You have severe, continued headaches not  relieved by medicine. Only take over-the-counter or prescription medicines for pain, fever, or discomfort as directed by your health care provider. °· You do not have normal function of the arms or legs or are unable to walk. °· You notice changes in the black spots in the center of the colored part of your eye (pupil). °· You have a clear or bloody fluid coming from your nose or ears. °· You have a loss of vision. °During the next 24 hours after the injury, you must stay with someone who can watch you for the warning signs. This person should contact local emergency services (911 in the [place] if you have seizures, you become unconscious, or you are unable to wake up. °HOW CAN I PREVENT A HEAD INJURY IN THE FUTURE? °The most important factor for preventing major head injuries is avoiding motor vehicle accidents.  To minimize the potential for damage to your head, it is crucial to wear seat belts while riding in motor vehicles. Wearing helmets while bike riding and playing collision sports (like football) is also helpful. Also, avoiding dangerous activities around the house will further help reduce your risk of head injury.  °WHEN CAN I RETURN TO NORMAL ACTIVITIES AND ATHLETICS? °You should be reevaluated by your health care provider before returning to these activities. If you have any of the following symptoms, you should not return to activities or contact sports until 1 week after the symptoms have stopped: °· Persistent headache. °· Dizziness or vertigo. °· Poor attention and concentration. °· Confusion. °·   Memory problems.  Nausea or vomiting.  Fatigue or tire easily.  Irritability.  Intolerant of bright lights or loud noises.  Anxiety or depression.  Disturbed sleep. MAKE SURE YOU:   Understand these instructions.  Will watch your condition.  Will get help right away if you are not doing well or get worse.   This information is not intended to replace advice given to you by your health  care provider. Make sure you discuss any questions you have with your health care provider.   Document Released: 08/24/2005 Document Revised: 09/14/2014 Document Reviewed: 05/01/2013 Elsevier Interactive Patient Education 2016 Else[employer] Inc[employer] Chest Wall Pain Chest wall pain is pain in or around the bones and muscles of your chest. Sometimes, an injury causes this pain. Sometimes, the cause may not be known. This pain may take several weeks or longer to get better. HOME CARE INSTRUCTIONS  Pay attention to any changes in your symptoms. Take these actions to help with your pain:   Rest as told by your health care provider.   Avoid activities that cause pain. These include any activities that use your chest muscles or your abdominal and side muscles to lift heavy items.   If directed, apply ice to the painful area:  Put ice in a plastic bag.  Place a towel between your skin and the bag.  Leave the ice on for 20 minutes, 2-3 times per day.  Take over-the-counter and prescription medicines only as told by your health care provider.  Do not use tobacco products, including cigarettes, chewing tobacco, and e-cigarettes. If you need help quitting, ask your health care provider.  Keep all follow-up visits as told by your health care provider. This is important. SEEK MEDICAL CARE IF:  You have a fever.  Your chest pain becomes worse.  You have new symptoms. SEEK IMMEDIATE MEDICAL CARE IF:  You have nausea or vomiting.  You feel sweaty or light-headed.  You have a cough with phlegm (sputum) or you cough up blood.  You develop shortness of breath.   This information is not intended to replace advice given to you by your health care provider. Make sure you discuss any questions you have with your health care provider.   Document Released: 08/24/2005 Document Revised: 05/15/2015 Document Reviewed: 11/19/2014 Elsevier Interactive Patient Education [employer]

## 2015-11-18 ENCOUNTER — Telehealth: Payer: Self-pay | Admitting: Cardiovascular Disease

## 2015-11-18 NOTE — Telephone Encounter (Signed)
I spoke with the pt and her last dose of Lovenox was yesterday prior to her fall and ER visit.  Audrie Lia Pharm D and Dr [name] reviewed this information and both felt that the pt could hold lovenox at this point.  The pt will continue with angiogram on 11/20/15. I will forward this information to Dr Kirke Corin to make him aware.

## 2015-11-18 NOTE — Telephone Encounter (Signed)
[name] [name] is calling because she had to go to the E/R on Last night because she fell and have a concussion and took her off of the Lovenox and told her to call and speak to a nurse today .   Thanks

## 2015-11-18 NOTE — Telephone Encounter (Signed)
Follow Up  Pt called to follow up on Lovenox. Please call back to confirm If it is okay for her to come of of lovenox. Pt req a call back

## 2015-11-19 ENCOUNTER — Ambulatory Visit: Payer: Medicare Other | Admitting: Sports Medicine

## 2015-11-20 ENCOUNTER — Inpatient Hospital Stay (HOSPITAL_COMMUNITY)
Admission: RE | Admit: 2015-11-20 | Discharge: 2015-11-23 | DRG: 252 | Disposition: A | Discharge status: Home Health | Payer: Medicare Other | Source: Ambulatory Visit | Attending: Cardiovascular Disease | Admitting: Cardiovascular Disease

## 2015-11-20 ENCOUNTER — Encounter (HOSPITAL_COMMUNITY): Payer: Self-pay | Admitting: Cardiovascular Disease

## 2015-11-20 ENCOUNTER — Encounter (HOSPITAL_COMMUNITY)
Admission: RE | Disposition: A | Discharge status: Home Health | Payer: Self-pay | Source: Ambulatory Visit | Attending: Cardiovascular Disease

## 2015-11-20 DIAGNOSIS — Z87891 Personal history of nicotine dependence: Secondary | ICD-10-CM

## 2015-11-20 DIAGNOSIS — E1122 Type 2 diabetes mellitus with diabetic chronic kidney disease: Secondary | ICD-10-CM | POA: Diagnosis present

## 2015-11-20 DIAGNOSIS — Z681 Body mass index (BMI) 19 or less, adult: Secondary | ICD-10-CM

## 2015-11-20 DIAGNOSIS — Z79899 Other long term (current) drug therapy: Secondary | ICD-10-CM

## 2015-11-20 DIAGNOSIS — I129 Hypertensive chronic kidney disease with stage 1 through stage 4 chronic kidney disease, or unspecified chronic kidney disease: Secondary | ICD-10-CM | POA: Diagnosis present

## 2015-11-20 DIAGNOSIS — I4891 Unspecified atrial fibrillation: Secondary | ICD-10-CM | POA: Diagnosis present

## 2015-11-20 DIAGNOSIS — I6350 Cerebral infarction due to unspecified occlusion or stenosis of unspecified cerebral artery: Secondary | ICD-10-CM | POA: Diagnosis present

## 2015-11-20 DIAGNOSIS — E11649 Type 2 diabetes mellitus with hypoglycemia without coma: Secondary | ICD-10-CM | POA: Diagnosis present

## 2015-11-20 DIAGNOSIS — I495 Sick sinus syndrome: Secondary | ICD-10-CM | POA: Diagnosis present

## 2015-11-20 DIAGNOSIS — I70245 Atherosclerosis of native arteries of left leg with ulceration of other part of foot: Secondary | ICD-10-CM | POA: Diagnosis not present

## 2015-11-20 DIAGNOSIS — E43 Unspecified severe protein-calorie malnutrition: Secondary | ICD-10-CM | POA: Diagnosis present

## 2015-11-20 DIAGNOSIS — N189 Chronic kidney disease, unspecified: Secondary | ICD-10-CM | POA: Diagnosis present

## 2015-11-20 DIAGNOSIS — E1151 Type 2 diabetes mellitus with diabetic peripheral angiopathy without gangrene: Principal | ICD-10-CM | POA: Diagnosis present

## 2015-11-20 DIAGNOSIS — B009 Herpesviral infection, unspecified: Secondary | ICD-10-CM | POA: Diagnosis present

## 2015-11-20 DIAGNOSIS — E210 Primary hyperparathyroidism: Secondary | ICD-10-CM | POA: Diagnosis present

## 2015-11-20 DIAGNOSIS — Z950 Presence of cardiac pacemaker: Secondary | ICD-10-CM | POA: Diagnosis present

## 2015-11-20 DIAGNOSIS — I080 Rheumatic disorders of both mitral and aortic valves: Secondary | ICD-10-CM | POA: Diagnosis present

## 2015-11-20 DIAGNOSIS — F329 Major depressive disorder, single episode, unspecified: Secondary | ICD-10-CM | POA: Diagnosis present

## 2015-11-20 DIAGNOSIS — I998 Other disorder of circulatory system: Secondary | ICD-10-CM | POA: Diagnosis present

## 2015-11-20 DIAGNOSIS — I481 Persistent atrial fibrillation: Secondary | ICD-10-CM | POA: Diagnosis present

## 2015-11-20 DIAGNOSIS — Z940 Kidney transplant status: Secondary | ICD-10-CM

## 2015-11-20 DIAGNOSIS — I050 Rheumatic mitral stenosis: Secondary | ICD-10-CM | POA: Diagnosis present

## 2015-11-20 DIAGNOSIS — L97529 Non-pressure chronic ulcer of other part of left foot with unspecified severity: Secondary | ICD-10-CM | POA: Diagnosis present

## 2015-11-20 DIAGNOSIS — Z7901 Long term (current) use of anticoagulants: Secondary | ICD-10-CM

## 2015-11-20 DIAGNOSIS — I739 Peripheral vascular disease, unspecified: Secondary | ICD-10-CM

## 2015-11-20 DIAGNOSIS — E1165 Type 2 diabetes mellitus with hyperglycemia: Secondary | ICD-10-CM

## 2015-11-20 DIAGNOSIS — I482 Chronic atrial fibrillation: Secondary | ICD-10-CM | POA: Diagnosis present

## 2015-11-20 DIAGNOSIS — I70229 Atherosclerosis of native arteries of extremities with rest pain, unspecified extremity: Secondary | ICD-10-CM | POA: Diagnosis present

## 2015-11-20 DIAGNOSIS — Z8673 Personal history of transient ischemic attack (TIA), and cerebral infarction without residual deficits: Secondary | ICD-10-CM

## 2015-11-20 DIAGNOSIS — E162 Hypoglycemia, unspecified: Secondary | ICD-10-CM

## 2015-11-20 DIAGNOSIS — Z7902 Long term (current) use of antithrombotics/antiplatelets: Secondary | ICD-10-CM

## 2015-11-20 HISTORY — DX: Non-pressure chronic ulcer of other part of unspecified foot with unspecified severity: L97.509

## 2015-11-20 HISTORY — DX: Personal history of peptic ulcer disease: Z87.11

## 2015-11-20 HISTORY — DX: Gastro-esophageal reflux disease without esophagitis: K21.9

## 2015-11-20 HISTORY — DX: Intracardiac thrombosis, not elsewhere classified: I51.3

## 2015-11-20 HISTORY — DX: Type 2 diabetes mellitus with foot ulcer: E11.621

## 2015-11-20 HISTORY — DX: Peripheral vascular disease, unspecified: I73.9

## 2015-11-20 HISTORY — DX: Presence of cardiac pacemaker: Z95.0

## 2015-11-20 HISTORY — DX: Personal history of other diseases of the digestive system: Z87.19

## 2015-11-20 HISTORY — PX: PERIPHERAL VASCULAR CATHETERIZATION: SHX172C

## 2015-11-20 HISTORY — DX: Transient cerebral ischemic attack, unspecified: G45.9

## 2015-11-20 HISTORY — DX: Pure hypercholesterolemia, unspecified: E78.00

## 2015-11-20 HISTORY — DX: Type 2 diabetes mellitus without complications: E11.9

## 2015-11-20 HISTORY — DX: Personal history of other medical treatment: Z92.89

## 2015-11-20 HISTORY — DX: Heart failure, unspecified: I50.9

## 2015-11-20 LAB — GLUCOSE, CAPILLARY
GLUCOSE-CAPILLARY: 162 mg/dL — AB (ref 65–99)
GLUCOSE-CAPILLARY: 256 mg/dL — AB (ref 65–99)
Glucose-Capillary: 195 mg/dL — ABNORMAL HIGH (ref 65–99)
Glucose-Capillary: 201 mg/dL — ABNORMAL HIGH (ref 65–99)

## 2015-11-20 LAB — PROTIME-INR
INR: 1.33 (ref 0.00–1.49)
Prothrombin Time: 16.6 s — ABNORMAL HIGH (ref 11.6–15.2)

## 2015-11-20 LAB — POCT ACTIVATED CLOTTING TIME
ACTIVATED CLOTTING TIME: 296 s
ACTIVATED CLOTTING TIME: 162 s
Activated Clotting Time: 291 seconds

## 2015-11-20 SURGERY — ABDOMINAL AORTOGRAM

## 2015-11-20 MED ORDER — SODIUM CHLORIDE 0.9% FLUSH: 3.0000 mL | INTRAVENOUS | Status: DC | PRN

## 2015-11-20 MED ORDER — MYCOPHENOLATE MOFETIL 250 MG PO CAPS
500.0000 mg | ORAL_CAPSULE | Freq: Two times a day (BID) | ORAL | Status: DC
  Administered 2015-11-20 – 2015-11-23 (×6): 500 mg via ORAL
  Filled 2015-11-20 (×6): qty 2

## 2015-11-20 MED ORDER — IODIXANOL 320 MG/ML IV SOLN
INTRAVENOUS | Status: DC | PRN
  Administered 2015-11-20: 125 mL via INTRAVENOUS

## 2015-11-20 MED ORDER — CLINDAMYCIN HCL 300 MG PO CAPS
450.0000 mg | ORAL_CAPSULE | Freq: Three times a day (TID) | ORAL | Status: DC
  Administered 2015-11-20 – 2015-11-23 (×11): 450 mg via ORAL
  Filled 2015-11-20 (×18): qty 1

## 2015-11-20 MED ORDER — SODIUM CHLORIDE 0.9% FLUSH: 3.0000 mL | Freq: Two times a day (BID) | INTRAVENOUS | Status: DC

## 2015-11-20 MED ORDER — LORATADINE 10 MG PO TABS: 10.0000 mg | ORAL_TABLET | Freq: Every day | ORAL | Status: DC | PRN

## 2015-11-20 MED ORDER — FENTANYL CITRATE (PF) 100 MCG/2ML IJ SOLN
INTRAMUSCULAR | Status: DC | PRN
  Administered 2015-11-20 (×2): 25 ug via INTRAVENOUS

## 2015-11-20 MED ORDER — LABETALOL HCL 5 MG/ML IV SOLN
20.0000 mg | Freq: Once | INTRAVENOUS | Status: AC
  Administered 2015-11-20: 20 mg via INTRAVENOUS

## 2015-11-20 MED ORDER — SODIUM CHLORIDE 0.9 % IV SOLN: 250.0000 mL | INTRAVENOUS | Status: DC | PRN

## 2015-11-20 MED ORDER — ATROPINE SULFATE 0.1 MG/ML IJ SOLN
INTRAMUSCULAR | Status: AC
  Filled 2015-11-20: qty 10

## 2015-11-20 MED ORDER — CLOPIDOGREL BISULFATE 75 MG PO TABS
75.0000 mg | ORAL_TABLET | Freq: Every day | ORAL | Status: DC
  Administered 2015-11-21 – 2015-11-23 (×3): 75 mg via ORAL
  Filled 2015-11-20 (×3): qty 1

## 2015-11-20 MED ORDER — ENSURE ENLIVE PO LIQD
237.0000 mL | Freq: Two times a day (BID) | ORAL | Status: DC
  Administered 2015-11-21 – 2015-11-23 (×5): 237 mL via ORAL
  Filled 2015-11-20 (×9): qty 237

## 2015-11-20 MED ORDER — VITAMIN D (ERGOCALCIFEROL) 1.25 MG (50000 UNIT) PO CAPS: 50000.0000 [IU] | ORAL_CAPSULE | ORAL | Status: DC

## 2015-11-20 MED ORDER — METOPROLOL TARTRATE 25 MG PO TABS
100.0000 mg | ORAL_TABLET | Freq: Two times a day (BID) | ORAL | Status: DC
  Administered 2015-11-20 – 2015-11-23 (×6): 100 mg via ORAL
  Filled 2015-11-20 (×6): qty 4

## 2015-11-20 MED ORDER — SODIUM CHLORIDE 0.9% FLUSH
3.0000 mL | Freq: Two times a day (BID) | INTRAVENOUS | Status: DC
  Administered 2015-11-21 – 2015-11-22 (×5): 3 mL via INTRAVENOUS

## 2015-11-20 MED ORDER — PREDNISONE 5 MG PO TABS
5.0000 mg | ORAL_TABLET | Freq: Every day | ORAL | Status: DC
  Administered 2015-11-21 – 2015-11-23 (×3): 5 mg via ORAL
  Filled 2015-11-20 (×3): qty 1

## 2015-11-20 MED ORDER — VALACYCLOVIR HCL 500 MG PO TABS
500.0000 mg | ORAL_TABLET | ORAL | Status: DC
  Administered 2015-11-22: 500 mg via ORAL
  Filled 2015-11-20 (×2): qty 1

## 2015-11-20 MED ORDER — FUROSEMIDE 20 MG PO TABS
20.0000 mg | ORAL_TABLET | ORAL | Status: DC
  Administered 2015-11-21 – 2015-11-23 (×2): 20 mg via ORAL
  Filled 2015-11-20 (×3): qty 1

## 2015-11-20 MED ORDER — FENTANYL CITRATE (PF) 100 MCG/2ML IJ SOLN
INTRAMUSCULAR | Status: AC
  Filled 2015-11-20: qty 2

## 2015-11-20 MED ORDER — CALCIUM CARBONATE-VITAMIN D 600-400 MG-UNIT PO TABS: 1.0000 | ORAL_TABLET | Freq: Two times a day (BID) | ORAL | Status: DC

## 2015-11-20 MED ORDER — HYPROMELLOSE (GONIOSCOPIC) 2.5 % OP SOLN: 1.0000 [drp] | Freq: Four times a day (QID) | OPHTHALMIC | Status: DC | PRN

## 2015-11-20 MED ORDER — WARFARIN - PHARMACIST DOSING INPATIENT
Freq: Every day | Status: DC
  Administered 2015-11-21: 19:00:00

## 2015-11-20 MED ORDER — HEPARIN (PORCINE) IN NACL 2-0.9 UNIT/ML-% IJ SOLN
INTRAMUSCULAR | Status: DC | PRN
Start: 2015-11-20 — End: 2015-11-20
  Administered 2015-11-20: 1000 mL

## 2015-11-20 MED ORDER — HEPARIN (PORCINE) IN NACL 2-0.9 UNIT/ML-% IJ SOLN
INTRAMUSCULAR | Status: AC
  Filled 2015-11-20: qty 1000

## 2015-11-20 MED ORDER — DIGOXIN 125 MCG PO TABS
0.1250 mg | ORAL_TABLET | Freq: Every day | ORAL | Status: DC
  Administered 2015-11-21 – 2015-11-23 (×3): 0.125 mg via ORAL
  Filled 2015-11-20 (×6): qty 1

## 2015-11-20 MED ORDER — INSULIN ASPART 100 UNIT/ML ~~LOC~~ SOLN
0.0000 [IU] | Freq: Three times a day (TID) | SUBCUTANEOUS | Status: DC
Start: 2015-11-21 — End: 2015-11-21

## 2015-11-20 MED ORDER — CYCLOSPORINE MODIFIED (NEORAL) 25 MG PO CAPS
25.0000 mg | ORAL_CAPSULE | Freq: Two times a day (BID) | ORAL | Status: DC
  Administered 2015-11-20 – 2015-11-23 (×6): 25 mg via ORAL
  Filled 2015-11-20 (×8): qty 1

## 2015-11-20 MED ORDER — HEPARIN SODIUM (PORCINE) 1000 UNIT/ML IJ SOLN
INTRAMUSCULAR | Status: DC | PRN
  Administered 2015-11-20: 2000 [IU] via INTRAVENOUS
  Administered 2015-11-20: 4000 [IU] via INTRAVENOUS

## 2015-11-20 MED ORDER — SODIUM CHLORIDE 0.9 % IV SOLN
INTRAVENOUS | Status: AC
  Administered 2015-11-20: 21:00:00 via INTRAVENOUS

## 2015-11-20 MED ORDER — LIDOCAINE HCL (PF) 1 % IJ SOLN
INTRAMUSCULAR | Status: DC | PRN
  Administered 2015-11-20: 20 mL

## 2015-11-20 MED ORDER — MIDAZOLAM HCL 2 MG/2ML IJ SOLN
INTRAMUSCULAR | Status: AC
  Filled 2015-11-20: qty 2

## 2015-11-20 MED ORDER — SODIUM CHLORIDE 0.9 % IV SOLN
INTRAVENOUS | Status: DC
  Administered 2015-11-20: 08:00:00 via INTRAVENOUS

## 2015-11-20 MED ORDER — POLYSACCHARIDE IRON COMPLEX 150 MG PO CAPS
150.0000 mg | ORAL_CAPSULE | Freq: Every day | ORAL | Status: DC
  Administered 2015-11-20 – 2015-11-23 (×4): 150 mg via ORAL
  Filled 2015-11-20 (×7): qty 1

## 2015-11-20 MED ORDER — MIDAZOLAM HCL 2 MG/2ML IJ SOLN
INTRAMUSCULAR | Status: DC | PRN
  Administered 2015-11-20: 1 mg via INTRAVENOUS

## 2015-11-20 MED ORDER — LIDOCAINE HCL (PF) 1 % IJ SOLN
INTRAMUSCULAR | Status: AC
  Filled 2015-11-20: qty 30

## 2015-11-20 MED ORDER — WARFARIN SODIUM 5 MG PO TABS
2.5000 mg | ORAL_TABLET | Freq: Once | ORAL | Status: AC
  Administered 2015-11-20: 2.5 mg via ORAL
  Filled 2015-11-20: qty 0.5

## 2015-11-20 MED ORDER — LABETALOL HCL 5 MG/ML IV SOLN
INTRAVENOUS | Status: AC
  Filled 2015-11-20: qty 4

## 2015-11-20 MED ORDER — ALBUTEROL SULFATE (2.5 MG/3ML) 0.083% IN NEBU: 2.5000 mg | INHALATION_SOLUTION | Freq: Four times a day (QID) | RESPIRATORY_TRACT | Status: DC | PRN

## 2015-11-20 MED ORDER — SODIUM CHLORIDE 0.9% FLUSH
3.0000 mL | INTRAVENOUS | Status: DC | PRN
  Administered 2015-11-22: 10:00:00 3 mL via INTRAVENOUS
  Filled 2015-11-20: qty 3

## 2015-11-20 MED ORDER — ACETAMINOPHEN 325 MG PO TABS: 650.0000 mg | ORAL_TABLET | ORAL | Status: DC | PRN

## 2015-11-20 MED ORDER — INSULIN DETEMIR 100 UNIT/ML ~~LOC~~ SOLN
10.0000 [IU] | Freq: Every day | SUBCUTANEOUS | Status: DC
Start: 2015-11-20 — End: 2015-11-22
  Administered 2015-11-20: 10 [IU] via SUBCUTANEOUS
  Filled 2015-11-20 (×2): qty 0.1

## 2015-11-20 MED ORDER — AMLODIPINE BESYLATE 5 MG PO TABS
5.0000 mg | ORAL_TABLET | Freq: Every day | ORAL | Status: DC
  Administered 2015-11-21 – 2015-11-23 (×3): 5 mg via ORAL
  Filled 2015-11-20 (×3): qty 1

## 2015-11-20 MED ORDER — HYDRALAZINE HCL 20 MG/ML IJ SOLN: 10.0000 mg | Freq: Four times a day (QID) | INTRAMUSCULAR | Status: DC | PRN

## 2015-11-20 MED ORDER — HEPARIN SODIUM (PORCINE) 1000 UNIT/ML IJ SOLN
INTRAMUSCULAR | Status: AC
  Filled 2015-11-20: qty 1

## 2015-11-20 MED ORDER — TRAMADOL HCL 50 MG PO TABS
50.0000 mg | ORAL_TABLET | Freq: Three times a day (TID) | ORAL | Status: DC | PRN
  Administered 2015-11-20 – 2015-11-21 (×3): 50 mg via ORAL
  Filled 2015-11-20 (×3): qty 1

## 2015-11-20 SURGICAL SUPPLY — 25 items
BALLN ANGIOSCULPT OTW 3.0X20 (BALLOONS) ×5
BALLOON ANGIOSCULPT OTW 3.0X20 (BALLOONS) ×3 IMPLANT
CATH ANGIO 5F PIGTAIL 65CM (CATHETERS) ×5 IMPLANT
CATH CROSS OVER TEMPO 5F (CATHETERS) ×5 IMPLANT
CATH CXI SUPP ANG 2.6FR 150CM (MICROCATHETER) ×5 IMPLANT
CATH SOFT-VU 4F 65 STRAIGHT (CATHETERS) ×3 IMPLANT
CATH SOFT-VU ST 4F 90CM (CATHETERS) ×5 IMPLANT
CATH SOFT-VU STRAIGHT 4F 65CM (CATHETERS) ×2
DEVICE CONTINUOUS FLUSH (MISCELLANEOUS) ×5 IMPLANT
GUIDEWIRE ASTATO XS 20G 300CM (WIRE) ×5 IMPLANT
KIT ENCORE 26 ADVANTAGE (KITS) ×5 IMPLANT
KIT MICROINTRODUCER STIFF 5F (SHEATH) ×5 IMPLANT
KIT PV (KITS) ×5 IMPLANT
SHEATH PINNACLE 5F 10CM (SHEATH) ×5 IMPLANT
SHEATH PINNACLE ST 6F 45CM (SHEATH) ×5 IMPLANT
SHIELD RADPAD SCOOP 12X17 (MISCELLANEOUS) ×5 IMPLANT
STOPCOCK MORSE 400PSI 3WAY (MISCELLANEOUS) ×5 IMPLANT
SYRINGE MEDRAD AVANTA MACH 7 (SYRINGE) ×5 IMPLANT
TRANSDUCER W/STOPCOCK (MISCELLANEOUS) ×5 IMPLANT
TRAY PV CATH (CUSTOM PROCEDURE TRAY) ×5 IMPLANT
TUBING CIL FLEX 10 FLL-RA (TUBING) ×5 IMPLANT
WIRE ASAHI FIELDER XT 300CM (WIRE) ×5 IMPLANT
WIRE HI TORQ WHISPER MS 300CM (WIRE) ×5 IMPLANT
WIRE HITORQ VERSACORE ST 145CM (WIRE) ×5 IMPLANT
WIRE RUNTHROUGH .014X300CM (WIRE) ×10 IMPLANT

## 2015-11-20 NOTE — Progress Notes (Signed)
ANTICOAGULATION CONSULT NOTE - Initial Consult  Pharmacy Consult for Warfarin Indication: atrial fibrillation  Allergies  Allergen Reactions  . Bactrim [Sulfamethoxazole-Trimethoprim] Itching  . Aspirin Other (See Comments)    GI upset  . Shellfish Allergy Hives    Also nausea  . Codeine Hives and Nausea Only  . Latex Rash  . Penicillins Nausea And Vomiting and Rash    Has patient had a PCN reaction causing immediate rash, facial/tongue/throat swelling, SOB or lightheadedness with hypotension:No Has patient had a PCN reaction causing severe rash involving mucus membranes or skin necrosis:No Has patient had a PCN reaction that required hospitalization:No Has patient had a PCN reaction occurring within the last 10 years:cannot recall If all of the above answers are "NO", then may proceed with Cephalosporin use.   . Tape Itching and Rash    Please use "paper" tape    Patient Measurements: Height: 5' (152.4 cm) Weight: 92 lb (41.731 kg) IBW/kg (Calculated) : 45.5   Vital Signs: Temp: 97.6 F (36.4 C) (03/15 0656) Temp Source: Oral (03/15 0656) BP: 186/92 mmHg (03/15 1400) Pulse Rate: 83 (03/15 1400)  Labs:  Recent Labs  11/20/15 0726  LABPROT 16.6*  INR 1.33    Estimated Creatinine Clearance: 30.7 mL/min (by C-G formula based on Cr of 1.17).   Medical History: Past Medical History  Diagnosis Date  . Diabetes mellitus   . Hypertension   . Chronic kidney disease     s/p renal transplant;  follows with [name]  . Stroke [facility]) December 2011    left MCA distribution  . Permanent atrial fibrillation (HCC)     coumadin clinic at [facility]. Has previously required Lovenox->Coumadin bridge.  . Severe mitral stenosis by prior echocardiogram     last echo 07/2012  . Aortic stenosis   . Symptomatic bradycardia     s/p pacer  . Depression   . HSV (herpes simplex virus) infection   . Anemia   . Hyperparathyroidism, primary (HCC)     s/p  parathyroidectomy  . PAD (peripheral artery disease) (HCC)     a. s/p RLE angioplasty with [name]. b. (by [name])  L popliteral artery orbital atherectomy and balloon angioplasty & L SFA orbital atherectomy and balloon angioplasty 11/30/12.  [name] PVD (peripheral vascular disease) (HCC)     Assessment: 69yof admitted for Peripheral Vascular Balloon Angioplasty Left TP trunk.  Admit INR 1.33.  Per MD restart warfarin tonight.   PTA warfarin 1.25mg  Tues/Sat and 2.5mg  all other days   Goal of Therapy:  INR 2-3 Monitor platelets by anticoagulation protocol: Yes   Plan:  Warfarin 2.5mg  x1 today Daily Protime  [name] Pharm.D. CPP, BCPS Clinical Pharmacist [phone] 11/20/2015 2:56 PM

## 2015-11-20 NOTE — Progress Notes (Signed)
Site area: right groin  Site Prior to Removal:  Level 0  Pressure Applied For 20 MINUTES    Minutes Beginning at 1550  Manual:   Yes.    Patient Status During Pull:  No distress   Post Pull Groin Site:  Level 0  Post Pull Instructions Given:  Yes.    Post Pull Pulses Present:  Yes.    Dressing Applied:  Yes.    Comments:  Pt tolerates well.

## 2015-11-20 NOTE — H&P (View-Only) (Signed)
Cardiology Office Note   Date:  11/15/2015   ID:  [name] [name], DOB 1946/05/06, MRN [id number]  PCP:  [name], NP  Cardiologist:  Dr. Excell Seltzer.   No chief complaint on file.     History of Present Illness: [name] [name] is a 70 y.o. female who was referred by Dr. Excell Seltzer for evaluation of critical limb ischemia.  She has known history of permanent atrial fibrillation, renal failure status post kidney transplant, mitral stenosis, and lower extremity peripheral arterial disease. She was seen by me in 2014 for severe left calf claudication associated with ulceration involving the left foot.  She underwent angiography in 11/2012 which confirmed severe calcified disease in the left popliteal artery as well as the SFA. She underwent a complex procedure with atherectomy of the left popliteal artery and the left SFA. There was also one-vessel runoff below the knee with only patent anterior tibial artery which had diffuse 60% disease. She has done well up until recently when she developed recurrent ulceration on left 2 small toes. She was seen by podiatry and has been placed on antibiotics. There is some mild drainage. She also has an ingrown toenail on the right big toe with some ulceration in that area. She is having breast pain affecting the left foot. Noninvasive vascular evaluation showed that the ABIs could not be calculated due to noncompressible vessels. There was possible focal left SFA occlusion and midright SFA occlusion.  Past Medical History  Diagnosis Date  . Diabetes mellitus   . Hypertension   . Chronic kidney disease     s/p renal transplant;  follows with [name]  . Stroke [facility]) December 2011    left MCA distribution  . Permanent atrial fibrillation (HCC)     coumadin clinic at [facility]. Has previously required Lovenox->Coumadin bridge.  . Severe mitral stenosis by prior echocardiogram     last echo 07/2012  . Aortic stenosis   .  Symptomatic bradycardia     s/p pacer  . Depression   . HSV (herpes simplex virus) infection   . Anemia   . PAD (peripheral artery disease) (HCC)     a. s/p RLE angioplasty with [name]. b. (by [name])  L popliteral artery orbital atherectomy and balloon angioplasty & L SFA orbital atherectomy and balloon angioplasty 11/30/12.  [name] Hyperparathyroidism, primary [facility])     s/p parathyroidectomy    Past Surgical History  Procedure Laterality Date  . Insert / replace / remove pacemaker    . Kidney transplant  December 1999  . Abdominal angiogram  11/30/2012    ABDOMINAL AORTIC ANGIOGRAM   DR ARDIA  . Dialysis fistula creation    . Abdominal aortagram N/A 11/30/2012    Procedure: ABDOMINAL [name];  Surgeon: [name], MD;  Location: [facility] CATH LAB;  Service: Cardiovascular;  Laterality: N/A;     Current Outpatient Prescriptions  Medication Sig Dispense Refill  . acetaminophen (TYLENOL) 500 MG tablet Take 500 mg by mouth every 6 (six) hours as needed. For pain    . albuterol (PROVENTIL) (2.5 MG/3ML) 0.083% nebulizer solution Take 2.5 mg by nebulization every 6 (six) hours as needed for wheezing or shortness of breath.    [name] amLODipine (NORVASC) 5 MG tablet Take 1 tablet (5 mg total) by mouth daily. 90 tablet 0  . amoxicillin (AMOXIL) 500 MG capsule Take 2,000 mg by mouth once. before dental procedure    . azelastine (OPTIVAR) 0.05 % ophthalmic  solution 1 drop 2 (two) times daily.    [name] b complex-vitamin c-folic acid (NEPHRO-VITE) 0.8 MG TABS Take 0.8 mg by mouth daily.      . benzonatate (TESSALON) 100 MG capsule TAKE ONE CAPSULE BY MOUTH 3 TIMES A DAY AS NEEDED FOR COUGH FOR 7 DAYS  0  . Calcium Carbonate-Vitamin D (CALTRATE 600+D) 600-400 MG-UNIT per tablet Take 1 tablet by mouth 2 (two) times daily.     . clindamycin (CLEOCIN) 150 MG capsule Take 3 capsules (450 mg total) by mouth every 6 (six) hours. 90 capsule 0  . cycloSPORINE modified (NEORAL/GENGRAF) 25 MG capsule Take 25  mg by mouth 2 (two) times daily.      . digoxin (LANOXIN) 0.125 MG tablet Take by mouth.    . ergocalciferol (VITAMIN D2) 50000 UNITS capsule Take 50,000 Units by mouth every 30 (thirty) days. First of the month    . fluticasone (FLONASE) 50 MCG/ACT nasal spray Place 2 sprays into the nose daily.    . furosemide (LASIX) 20 MG tablet TAKE 1 TABLET BY MOUTH EVERY OTHER DAY 30 tablet 0  . hydroxypropyl methylcellulose (ISOPTO TEARS) 2.5 % ophthalmic solution Place 1 drop into both eyes 4 (four) times daily as needed (dry eyes).    . insulin detemir (LEVEMIR) 100 UNIT/ML injection Inject 10 Units into the skin at bedtime.    . Iron 66 MG TABS Take 1 tablet by mouth daily.    . iron polysaccharides (NIFEREX) 150 MG capsule Take 150 mg by mouth.    . lidocaine (XYLOCAINE) 5 % ointment Apply 1 application topically as needed. For foot pain 35.44 g 0  . loratadine (CLARITIN) 10 MG tablet Take 10 mg by mouth daily.    . metoprolol (LOPRESSOR) 100 MG tablet TAKE 1 TABLET BY MOUTH TWICE A DAY 60 tablet 0  . mupirocin cream (BACTROBAN) 2 % APPLY TO AFFECTED AREA 3 TIMES A DAY FOR 10 DAYS  0  . mycophenolate (CELLCEPT) 250 MG capsule Take 500 mg by mouth 2 (two) times daily.     [name] NOVOLOG FLEXPEN 100 UNIT/ML FlexPen USE AS DIRECTED PER SLIDING SCALE 30 MINUTES BEFORE MEALS  3  . ONE TOUCH ULTRA TEST test strip TEST BLOOD SUGAR 3 TIMES A DAY (E11.9)  3  . predniSONE (DELTASONE) 5 MG tablet Take 5 mg by mouth daily.      . sitaGLIPtin (JANUVIA) 100 MG tablet Take 100 mg by mouth daily.      . valACYclovir (VALTREX) 500 MG tablet Take 500 mg by mouth 3 (three) times a week. Only on Monday, Wednesday and Friday    . warfarin (COUMADIN) 2.5 MG tablet TAKE AS DIRECTED BY ANTICOAGULATION CLINIC 30 tablet 3  . clopidogrel (PLAVIX) 75 MG tablet Take 1 tablet (75 mg total) by mouth daily. 30 tablet 11  . enoxaparin (LOVENOX) 40 MG/0.4ML injection Inject 0.4 mLs (40 mg total) into the skin every 12 (twelve) hours. 20  Syringe 0  . traMADol (ULTRAM) 50 MG tablet Take 1 tablet (50 mg total) by mouth every 8 (eight) hours as needed for moderate pain. 60 tablet 0   No current facility-administered medications for this visit.    Allergies:   Bactrim; Aspirin; Codeine; Penicillins; and Latex    Social History:  The patient  reports that she has quit smoking. She has never used smokeless tobacco. She reports that she does not drink alcohol or use illicit drugs.   Family History:  The patient's family history  includes Kidney disease in her brother and sister. There is no history of Heart attack or Stroke. She was adopted.    ROS:  Please see the history of present illness.   Otherwise, review of systems are positive for none.   All other systems are reviewed and negative.    PHYSICAL EXAM: VS:  BP 128/80 mmHg  Pulse 57  Ht 5' (1.524 m)  Wt 91 lb (41.277 kg)  BMI 17.77 kg/m2  SpO2 99% , BMI Body mass index is 17.77 kg/(m^2). GEN: Well nourished, well developed, in no acute distress HEENT: normal Neck: no JVD, carotid bruits, or masses Cardiac: RRR; no  rubs, or gallops,no edema . There is a 2/6 systolic murmur at the left sternal border Respiratory:  clear to auscultation bilaterally, normal work of breathing GI: soft, nontender, nondistended, + BS MS: no deformity or atrophy Skin: warm and dry, no rash Neuro:  Strength and sensation are intact Psych: euthymic mood, full affect Vascular: Femoral pulses are normal. Distal pulses are not palpable. Small ulceration involving the left to small dose.  EKG:  EKG is not ordered today.    Recent Labs: 11/13/2015: BUN 23; Creat 1.17*; Hemoglobin 13.9; Platelets 208; Potassium 4.1; Sodium 140    Lipid Panel    Component Value Date/Time   CHOL * 08/19/2009 0547    221        ATP III CLASSIFICATION:  <200     mg/dL   Desirable  998-338  mg/dL   Borderline High  >=250    mg/dL   High          TRIG 87 08/19/2009 0547   HDL 113 RESULTS CONFIRMED BY  MANUAL DILUTION 08/19/2009 0547   CHOLHDL 2.0 08/19/2009 0547   VLDL 17 08/19/2009 0547   LDLCALC  08/19/2009 0547    91        Total Cholesterol/HDL:CHD Risk Coronary Heart Disease Risk Table                     Men   Women  1/2 Average Risk   3.4   3.3  Average Risk       5.0   4.4  2 X Average Risk   9.6   7.1  3 X Average Risk  23.4   11.0        Use the calculated Patient Ratio above and the CHD Risk Table to determine the patient's CHD Risk.        ATP III CLASSIFICATION (LDL):  <100     mg/dL   Optimal  539-767  mg/dL   Near or Above                    Optimal  130-159  mg/dL   Borderline  341-937  mg/dL   High  >902     mg/dL   Very High      Wt Readings from Last 3 Encounters:  11/12/15 91 lb (41.277 kg)  11/06/15 92 lb (41.731 kg)  12/31/14 92 lb (41.731 kg)         ASSESSMENT AND PLAN:  1.  Peripheral arterial disease with critical limb ischemia involving the left lower extremity. The right big toe is not significantly affected and most likely will be needed. However, the patient is at high risk for limb loss on the left side. Thus, I recommend proceeding with urgent angiography and possible endovascular intervention. I discussed risks and benefits. She will require  interruption of warfarin with Lovenox bridging. In the meanwhile, her to start Plavix 75 mg once daily. She is status post kidney transplant and we have to be cautious with contrast. I am planning to focus on the left leg only. Right lower extremity angiography can be considered at a later date if needed. Most likely I wills observe her overnight after the procedure for hydration.  2. Chronic atrial fibrillation: Ventricular rate is well controlled. 3. Severe mitral stenosis: She appears to be asymptomatic.    Disposition:   FU with me in 1 month  Signed,  Lorine Bears, MD  11/15/2015 6:30 PM    [facility]

## 2015-11-20 NOTE — Interval H&P Note (Signed)
History and Physical Interval Note:  11/20/2015 8:37 AM  [name] [name]  has presented today for surgery, with the diagnosis of pvd  The various methods of treatment have been discussed with the patient and family. After consideration of risks, benefits and other options for treatment, the patient has consented to  Procedure(s): Abdominal Aortogram (N/A) as a surgical intervention .  The patient's history has been reviewed, patient examined, no change in status, stable for surgery.  I have reviewed the patient's chart and labs.  Questions were answered to the patient's satisfaction.     Lorine Bears

## 2015-11-21 DIAGNOSIS — Z7901 Long term (current) use of anticoagulants: Secondary | ICD-10-CM | POA: Diagnosis not present

## 2015-11-21 DIAGNOSIS — I129 Hypertensive chronic kidney disease with stage 1 through stage 4 chronic kidney disease, or unspecified chronic kidney disease: Secondary | ICD-10-CM | POA: Diagnosis present

## 2015-11-21 DIAGNOSIS — F329 Major depressive disorder, single episode, unspecified: Secondary | ICD-10-CM | POA: Diagnosis present

## 2015-11-21 DIAGNOSIS — I482 Chronic atrial fibrillation: Secondary | ICD-10-CM | POA: Diagnosis present

## 2015-11-21 DIAGNOSIS — I080 Rheumatic disorders of both mitral and aortic valves: Secondary | ICD-10-CM | POA: Diagnosis present

## 2015-11-21 DIAGNOSIS — E43 Unspecified severe protein-calorie malnutrition: Secondary | ICD-10-CM | POA: Diagnosis present

## 2015-11-21 DIAGNOSIS — E1122 Type 2 diabetes mellitus with diabetic chronic kidney disease: Secondary | ICD-10-CM | POA: Diagnosis present

## 2015-11-21 DIAGNOSIS — Z940 Kidney transplant status: Secondary | ICD-10-CM | POA: Diagnosis not present

## 2015-11-21 DIAGNOSIS — I739 Peripheral vascular disease, unspecified: Secondary | ICD-10-CM

## 2015-11-21 DIAGNOSIS — E210 Primary hyperparathyroidism: Secondary | ICD-10-CM | POA: Diagnosis present

## 2015-11-21 DIAGNOSIS — Z87891 Personal history of nicotine dependence: Secondary | ICD-10-CM | POA: Diagnosis not present

## 2015-11-21 DIAGNOSIS — I4891 Unspecified atrial fibrillation: Secondary | ICD-10-CM

## 2015-11-21 DIAGNOSIS — Z79899 Other long term (current) drug therapy: Secondary | ICD-10-CM | POA: Diagnosis not present

## 2015-11-21 DIAGNOSIS — Z8673 Personal history of transient ischemic attack (TIA), and cerebral infarction without residual deficits: Secondary | ICD-10-CM | POA: Diagnosis not present

## 2015-11-21 DIAGNOSIS — N189 Chronic kidney disease, unspecified: Secondary | ICD-10-CM | POA: Diagnosis present

## 2015-11-21 DIAGNOSIS — Z681 Body mass index (BMI) 19 or less, adult: Secondary | ICD-10-CM | POA: Diagnosis not present

## 2015-11-21 DIAGNOSIS — Z7902 Long term (current) use of antithrombotics/antiplatelets: Secondary | ICD-10-CM | POA: Diagnosis not present

## 2015-11-21 DIAGNOSIS — I340 Nonrheumatic mitral (valve) insufficiency: Secondary | ICD-10-CM | POA: Diagnosis not present

## 2015-11-21 DIAGNOSIS — I998 Other disorder of circulatory system: Secondary | ICD-10-CM | POA: Diagnosis not present

## 2015-11-21 DIAGNOSIS — E11649 Type 2 diabetes mellitus with hypoglycemia without coma: Secondary | ICD-10-CM | POA: Diagnosis present

## 2015-11-21 DIAGNOSIS — E1151 Type 2 diabetes mellitus with diabetic peripheral angiopathy without gangrene: Secondary | ICD-10-CM | POA: Diagnosis present

## 2015-11-21 DIAGNOSIS — L97529 Non-pressure chronic ulcer of other part of left foot with unspecified severity: Secondary | ICD-10-CM | POA: Diagnosis present

## 2015-11-21 DIAGNOSIS — B009 Herpesviral infection, unspecified: Secondary | ICD-10-CM | POA: Diagnosis present

## 2015-11-21 DIAGNOSIS — I481 Persistent atrial fibrillation: Secondary | ICD-10-CM | POA: Diagnosis present

## 2015-11-21 LAB — CBC
HCT: 34.9 % — ABNORMAL LOW (ref 36.0–46.0)
Hemoglobin: 11.5 g/dL — ABNORMAL LOW (ref 12.0–15.0)
MCH: 32.7 pg (ref 26.0–34.0)
MCHC: 33 g/dL (ref 30.0–36.0)
MCV: 99.1 fL (ref 78.0–100.0)
PLATELETS: 150 10*3/uL (ref 150–400)
RBC: 3.52 MIL/uL — ABNORMAL LOW (ref 3.87–5.11)
RDW: 15.3 % (ref 11.5–15.5)
WBC: 6.3 10*3/uL (ref 4.0–10.5)

## 2015-11-21 LAB — BASIC METABOLIC PANEL
Anion gap: 9 (ref 5–15)
BUN: 16 mg/dL (ref 6–20)
CALCIUM: 8.7 mg/dL — AB (ref 8.9–10.3)
CO2: 22 mmol/L (ref 22–32)
CREATININE: 1.05 mg/dL — AB (ref 0.44–1.00)
Chloride: 108 mmol/L (ref 101–111)
GFR calc non Af Amer: 54 mL/min — ABNORMAL LOW (ref >60)
GLUCOSE: 122 mg/dL — AB (ref 65–99)
Potassium: 3.9 mmol/L (ref 3.5–5.1)
Sodium: 139 mmol/L (ref 135–145)

## 2015-11-21 LAB — GLUCOSE, CAPILLARY
GLUCOSE-CAPILLARY: 46 mg/dL — AB (ref 65–99)
GLUCOSE-CAPILLARY: 92 mg/dL (ref 65–99)
GLUCOSE-CAPILLARY: 145 mg/dL — AB (ref 65–99)
GLUCOSE-CAPILLARY: 94 mg/dL (ref 65–99)
Glucose-Capillary: 49 mg/dL — ABNORMAL LOW (ref 65–99)
Glucose-Capillary: 182 mg/dL — ABNORMAL HIGH (ref 65–99)

## 2015-11-21 LAB — PROTIME-INR
INR: 1.39 (ref 0.00–1.49)
Prothrombin Time: 17.1 seconds — ABNORMAL HIGH (ref 11.6–15.2)

## 2015-11-21 LAB — POCT ACTIVATED CLOTTING TIME
Activated Clotting Time: 229 seconds
Activated Clotting Time: 198 seconds

## 2015-11-21 MED ORDER — WARFARIN SODIUM 5 MG PO TABS
2.5000 mg | ORAL_TABLET | Freq: Once | ORAL | Status: AC
  Administered 2015-11-21: 2.5 mg via ORAL
  Filled 2015-11-21: qty 0.5

## 2015-11-21 MED ORDER — ENOXAPARIN SODIUM 60 MG/0.6ML ~~LOC~~ SOLN
60.0000 mg | SUBCUTANEOUS | Status: DC
  Administered 2015-11-21 – 2015-11-23 (×3): 60 mg via SUBCUTANEOUS
  Filled 2015-11-21: qty 1
  Filled 2015-11-21 (×2): qty 0.6
  Filled 2015-11-21: qty 1
  Filled 2015-11-21: qty 0.6

## 2015-11-21 MED ORDER — INSULIN ASPART 100 UNIT/ML ~~LOC~~ SOLN
0.0000 [IU] | Freq: Three times a day (TID) | SUBCUTANEOUS | Status: DC
  Administered 2015-11-21: 2 [IU] via SUBCUTANEOUS
  Administered 2015-11-22: 3 [IU] via SUBCUTANEOUS
  Administered 2015-11-22: 08:00:00 1 [IU] via SUBCUTANEOUS
  Administered 2015-11-22: 2 [IU] via SUBCUTANEOUS
  Administered 2015-11-23: 08:00:00 1 [IU] via SUBCUTANEOUS

## 2015-11-21 MED ORDER — HYDROCODONE-ACETAMINOPHEN 5-325 MG PO TABS
1.0000 | ORAL_TABLET | ORAL | Status: DC | PRN
Start: 2015-11-21 — End: 2015-11-23
  Administered 2015-11-21 – 2015-11-23 (×5): 1 via ORAL
  Filled 2015-11-21 (×7): qty 1

## 2015-11-21 NOTE — Progress Notes (Signed)
Pt's cbg this morning at 0650 was 49. Snack of cheese and fruit cocktail given and rechecked 15 mins later and read at 46. Pt is now eating breakfast and cbg is 92. Pt did not receive any ssi at dinner(201)  nor bedtime(256) last  night but did receive her scheduled 10 units of levimir and also had a small snack at that time.  Pt states that she always drops low during the night after receiving her levimir. Pt was totally asymptomatic.

## 2015-11-21 NOTE — Progress Notes (Signed)
Utilization review completed. Ixchel Duck, RN, BSN. 

## 2015-11-21 NOTE — Care Management Note (Addendum)
Case Management Note  Patient Details  Name: [name] [name] MRN: [id number] Date of Birth: 1946-04-20  Subjective/Objective:      LE angiography 11/20/2015 patent L SFA              Action/Plan: Discharge Planning: AVS reviewed NCM spoke to pt at bedside. Gave permission to speak to dtr, Tammy # [phone]. Pt states she lives at home alone, her dtr, [name] will assist with picking up meds and taking to her appts. Offered choice for HH/ [facility] list provided. Pt agreeable to [facility]. 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 Contacted [employer] rep for [facility]. Pt complaining of poor appetite and pain. NCM did speak with pt's Unit RN and attending made aware. Unit RN will do teaching on Lovenox injections. Pt states she has walker at home but no wheels. Contacted [facility] DME rep for RW with seat and 3n1 for home.    Expected Discharge Date:  11/22/2015               Expected Discharge Plan:  Home w Home Health Services  In-House Referral:  Clinical Social Work  Discharge planning Services  CM Consult  Post Acute Care Choice:  Home Health Choice offered to:  Patient  DME Arranged:  3-N-1, Walker rolling with seat DME Agency:  [facility]  HH Arranged:  RN, PT, Social Work, Nurse's Aide HH Agency:  [facility]  Status of Service:     Medicare Important Message Given:    Date Medicare IM Given:    Medicare IM give by:    Date Additional Medicare IM Given:    Additional Medicare Important Message give by:     If discussed at Long Length of Stay Meetings, dates discussed:    Additional Comments:  Elliot Cousin, RN 11/21/2015, 11:44 AM

## 2015-11-21 NOTE — Progress Notes (Signed)
Patient Name: [name] [name] Date of Encounter: 11/21/2015  Primary Cardiologist: Dr. Denyse Amass. Kirke Corin   Principal Problem:   Critical lower limb ischemia Active Problems:   Mitral stenosis   Atrial fibrillation (HCC)   BRADYCARDIA-TACHYCARDIA SYNDROME   Cerebral artery occlusion with cerebral infarction (HCC)   PVD   PACEMAKER, PERMANENT    SUBJECTIVE  Denies any leg pain, has R shoulder pain from fall last week.  CURRENT MEDS . amLODipine  5 mg Oral Daily  . clindamycin  450 mg Oral TID PC & HS  . clopidogrel  75 mg Oral Daily  . cycloSPORINE modified  25 mg Oral BID  . digoxin  0.125 mg Oral Daily  . enoxaparin (LOVENOX) injection  60 mg Subcutaneous Q24H  . feeding supplement (ENSURE ENLIVE)  237 mL Oral BID BM  . furosemide  20 mg Oral QODAY  . insulin aspart  0-15 Units Subcutaneous TID WC  . insulin detemir  10 Units Subcutaneous QHS  . iron polysaccharides  150 mg Oral Daily  . metoprolol  100 mg Oral BID  . mycophenolate  500 mg Oral BID  . predniSONE  5 mg Oral QAC breakfast  . sodium chloride flush  3 mL Intravenous Q12H  . [START ON 11/22/2015] valACYclovir  500 mg Oral Q M,W,F  . [START ON 12/07/2015] Vitamin D (Ergocalciferol)  50,000 Units Oral Q30 days  . Warfarin - Pharmacist Dosing Inpatient   Does not apply q1800    OBJECTIVE  Filed Vitals:   11/20/15 2350 11/20/15 2355 11/21/15 0329 11/21/15 0818  BP:   144/55 132/85  Pulse:   69 65  Temp:   98.8 F (37.1 C) 98 F (36.7 C)  TempSrc:   Oral Oral  Resp: 18 23 17 22   Height:      Weight:   93 lb 4.1 oz (42.3 kg)   SpO2:   98% 98%    Intake/Output Summary (Last 24 hours) at 11/21/15 1018 Last data filed at 11/21/15 0720  Gross per 24 hour  Intake 1227.5 ml  Output    700 ml  Net  527.5 ml   Filed Weights   11/20/15 0656 11/21/15 0329  Weight: 92 lb (41.731 kg) 93 lb 4.1 oz (42.3 kg)    PHYSICAL EXAM  General: Pleasant, NAD. Neuro: Alert and oriented X 3. Moves all extremities  spontaneously. Psych: Normal affect. HEENT:  Normal  Neck: Supple without bruits or JVD. Lungs:  Resp regular and unlabored, CTA. R sided pectoral pacemaker Heart: irregular. no s3, s4, or murmurs. Abdomen: Soft, non-tender, non-distended, BS + x 4.  Extremities: No clubbing, cyanosis or edema. DP/PT/Radials 2+ and equal bilaterally.  Accessory Clinical Findings  CBC  Recent Labs  11/21/15 0425  WBC 6.3  HGB 11.5*  HCT 34.9*  MCV 99.1  PLT 150   Basic Metabolic Panel  Recent Labs  11/21/15 0425  NA 139  K 3.9  CL 108  CO2 22  GLUCOSE 122*  BUN 16  CREATININE 1.05*  CALCIUM 8.7*    TELE Sinus brady with paced rhythm    ECG  No new EKG  Echocardiogram 12/05/2014  LV EF: 60% -  65%  ------------------------------------------------------------------- Indications:   (I05.0).  ------------------------------------------------------------------- History:  PMH: Severe mitral stenosis. AS. Acquired from the patient and from the patient&'s chart. Atrial fibrillation. Stroke. Risk factors: Former tobacco use. Hypertension. Diabetes mellitus.  ------------------------------------------------------------------- Study Conclusions  - Left ventricle: The cavity size was normal. Wall thickness was increased  in a pattern of mild LVH. Systolic function was normal. The estimated ejection fraction was in the range of 60% to 65%. - Aortic valve: There was mild stenosis. Valve area (VTI): 0.56 cm^2. Valve area (Vmax): 0.47 cm^2. Valve area (Vmean): 0.54 cm^2. - Mitral valve: Severely calcified annulus. Severely thickened, severely calcified leaflets . The findings are consistent with severe stenosis. Valve area by continuity equation (using LVOT flow): 0.23 cm^2. - Left atrium: The atrium was severely dilated. - Right atrium: The atrium was moderately dilated. - Tricuspid valve: There was severe regurgitation. - Pulmonary arteries: Systolic  pressure was mildly to moderately increased. PA peak pressure: 39 mm Hg (S).    Radiology/Studies  Dg Chest 2 View  11/17/2015  CLINICAL DATA:  Fall with right-sided pain.  Initial encounter. EXAM: CHEST  2 VIEW COMPARISON:  11/04/2010 FINDINGS: Chronic cardiomegaly, likely progressed. There is a single chamber pacer lead into the right ventricle from the right. Negative aortic and hilar contours. There is no edema, consolidation, effusion, or pneumothorax. There is are innumerable nodular densities bilaterally which appear calcified and are new or progressed. Diffuse vascular calcification. Patient has history of primary hyperparathyroidism, accounting for surgical clips at the thoracic inlet. IMPRESSION: 1. No acute or posttraumatic finding. 2. Nodular pulmonary calcification which has developed/progressed since 2012. Favor metastatic pulmonary calcification in this patient with primary hyperparathyroidism. Electronically Signed   By: Marnee Spring M.D.   On: 11/17/2015 23:37   Ct Head Wo Contrast  11/17/2015  CLINICAL DATA:  Multiple recent falls. Recent Lovenox and ejection. LEFT headache. RIGHT head pain. No loss of consciousness. History of chronic kidney disease, diabetes, hypertension. EXAM: CT HEAD WITHOUT CONTRAST TECHNIQUE: Contiguous axial images were obtained from the base of the skull through the vertex without intravenous contrast. COMPARISON:  MRI of the head August 18, 2009 FINDINGS: Small area RIGHT frontoparietal encephalomalacia corresponding to old infarct. Small old bilateral cerebellar infarcts. No intraparenchymal hemorrhage, mass effect, midline shift or acute large vascular territory infarcts. Ventricles and sulci are overall normal for patient's age. Very mild chronic small vessel ischemic disease, less than expected for age. No abnormal extra-axial fluid collections. Basal cisterns are patent. Extensive vascular and dural calcifications. 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 Severe calcific atherosclerosis of the aortoiliac vessels. LEFT maxillary mucosal retention cyst without paranasal sinus air-fluid levels. Moderate temporomandibular osteoarthrosis. Ocular globes L orbital contents are non-suspicious. IMPRESSION: No acute intracranial process. Old small RIGHT MCA territory infarct and old small cerebellar infarcts. Minimal chronic small vessel ischemic disease. Electronically Signed   By: Awilda Metro M.D.   On: 11/17/2015 23:41   Dg Foot 2 Views Left  10/29/2015  AP and lateral x-rays of the right foot were taken.  No evidence of bony pathology.  There are multiple areas of calcified vessels in leg and foot.  No fracture dislocation or tumor noted.  Bone density appears normal for patients age and sex.  Joint space appears normal.  No frank deformity  Helane Gunther DPM  Dg Foot 2 Views Right  10/29/2015  AP and lateral x-rays of the right foot were taken.  No evidence of bony pathology.  There are multiple areas of calcified vessels in leg and foot.  No fracture dislocation or tumor noted.  Bone density appears normal for patients age and sex.  Joint space appears normal.  No frank deformity.   ASSESSMENT AND PLAN  1. PAD with critical limb ischemia to LLE  - LE angiography 11/20/2015  patent L SFA, one vessel runoff via peroneal artery with significant dx in TP trunk, short occlusion of L anterior tibial artery, severe stenosis fo distal R SFA with 1 vessel run off. S/p successful angioplasty of L TP trunk with inability to wire the anterior tibial artery  - plan to discharge today with coumadin and lovenox bridge. May need to consider angioplasty of L anterior tibial artery and R SFA later.  - PT and case manager consulted, need to establish with PCP as she has episodic hypoglycemic event during this admission. Also need home health for dressing change and lovenox injection. She had a fall last week and has trouble using her  R arm.  2. Severe mitral stenosis, asymptomatic  3. Permanent atrial fibrillation  - CHA2DS2-Vasc score 7 (female, age, PAD, HTN, DM II, TIA)  4. H/o renal flaure s/p kidney transplant  5. H/o bradycardia s/p PPM  Signed, Azalee Course PA-C Pager: [phone]

## 2015-11-21 NOTE — Plan of Care (Signed)
Problem: Consults Goal: Diabetes Guidelines if Diabetic/Glucose > 140 If diabetic or lab glucose is > 140 mg/dl - Initiate Diabetes/Hyperglycemia Guidelines & Document Interventions  Outcome: Completed/Met Date Met:  11/21/15 Blood sugar has been low.  Diabetes RN has reviewed and given recommendations.

## 2015-11-21 NOTE — Progress Notes (Signed)
Initial Nutrition Assessment  DOCUMENTATION CODES:   Severe malnutrition in context of chronic illness  INTERVENTION:  -Ensure Enlive po BID, each supplement provides 350 kcal and 20 grams of protein   NUTRITION DIAGNOSIS:   Malnutrition related to chronic illness as evidenced by energy intake < 75% for > or equal to 1 month, severe depletion of muscle mass, severe depletion of body fat.  GOAL:   Patient will meet greater than or equal to 90% of their needs  MONITOR:   PO intake, Supplement acceptance, Labs, I & O's  REASON FOR ASSESSMENT:   Malnutrition Screening Tool    ASSESSMENT:   [name] [name] is a 70 y.o. female who was referred by Dr. Excell Seltzer for evaluation of critical limb ischemia, She has known history of permanent atrial fibrillation, renal failure status post kidney transplant, mitral stenosis, and lower extremity peripheral arterial disease. She was seen by me in 2014 for severe left calf claudication associated with ulceration involving the left foot.  Spoke with [name] [name] at bedside. She endorses poor appetite related to multiple falls she had. She stated "I was so sore, I couldn't eat." She also has ulcers on her feet that prevented her from cooking like she normally did. As a result she ate mostly peaches, applesauce, and a can of chicken noodle soup occasionally. She endorses a normal wt of about 105#, but denies wt loss. Was 93# as of today; no evidence of 105# in chart.  Nutrition-Focused physical exam completed. Findings are severe fat depletion, severe muscle depletion, and no edema.   Pt CBG was 47 this morning; stated she always goes hypogylcemic at night when given her levemir. Stated it is normal. She was completely asymptomatic.  Labs and medications reviewed.  Diet Order:  Diet heart healthy/carb modified Room service appropriate?: Yes; Fluid consistency:: Thin  Skin:  Wound (see comment) (Diabetic foot ulcer.)  Last BM:  3/16  Height:    Ht Readings from Last 1 Encounters:  11/20/15 5' (1.524 m)    Weight:   Wt Readings from Last 1 Encounters:  11/21/15 93 lb 4.1 oz (42.3 kg)    Ideal Body Weight:  45.45 kg  BMI:  Body mass index is 18.21 kg/(m^2).  Estimated Nutritional Needs:   Kcal:  1400-1600 calories  Protein:  45-55 grams  Fluid:  >/= 1.4L  EDUCATION NEEDS:   No education needs identified at this time  Dionne Ano. Caelyn Route, MS, RD LDN After Hours/Weekend Pager [phone]

## 2015-11-21 NOTE — Progress Notes (Signed)
ANTICOAGULATION CONSULT NOTE  Pharmacy Consult for Warfarin Indication: atrial fibrillation  Allergies  Allergen Reactions  . Bactrim [Sulfamethoxazole-Trimethoprim] Itching  . Aspirin Other (See Comments)    GI upset  . Shellfish Allergy Hives    Also nausea  . Codeine Hives and Nausea Only  . Latex Rash  . Penicillins Nausea And Vomiting and Rash    Has patient had a PCN reaction causing immediate rash, facial/tongue/throat swelling, SOB or lightheadedness with hypotension:No Has patient had a PCN reaction causing severe rash involving mucus membranes or skin necrosis:No Has patient had a PCN reaction that required hospitalization:No Has patient had a PCN reaction occurring within the last 10 years:cannot recall If all of the above answers are "NO", then may proceed with Cephalosporin use.   . Tape Itching and Rash    Please use "paper" tape    Patient Measurements: Height: 5' (152.4 cm) Weight: 93 lb 4.1 oz (42.3 kg) IBW/kg (Calculated) : 45.5   Vital Signs: Temp: 98 F (36.7 C) (03/16 0818) Temp Source: Oral (03/16 0818) BP: 132/85 mmHg (03/16 0818) Pulse Rate: 65 (03/16 0818)  Labs:  Recent Labs  11/20/15 0726 11/21/15 0425  HGB  --  11.5*  HCT  --  34.9*  PLT  --  150  LABPROT 16.6* 17.1*  INR 1.33 1.39  CREATININE  --  1.05*    Estimated Creatinine Clearance: 34.7 mL/min (by C-G formula based on Cr of 1.05).  Assessment: [name] [name] admitted for peripheral vascular balloon angioplasty L TP trunk. She is on chronic coumadin for history of afib. INR remains subtherapeutic as expected at 1.39. H/H slightly low and platelets are WNL. No bleeding noted. Therapeutic lovenox started today. Dosed as 1.5mg /kg/day for ease of outpatient administration. However, renal function is borderline so dose may require adjustment if CrCl fall below 30 ml/min.    PTA warfarin 1.25mg  Tues/Sat and 2.5mg  all other days   Goal of Therapy:  INR 2-3   Plan:  - Repeat warfarin  2.5mg  PO x 1 tonight - Daily INR - Continue lovenox 1.5mg /kg/day for now, if CrCl falls below 74ml/min, reduce dose to 40mg  SQ daily - BMET in AM  Lysle Pearl, PharmD, BCPS Pager # [phone] 11/21/2015 1:28 PM

## 2015-11-21 NOTE — Progress Notes (Signed)
Inpatient Diabetes Program Recommendations  AACE/ADA: New Consensus Statement on Inpatient Glycemic Control (2015)  Target Ranges:  Prepandial:   less than 140 mg/dL      Peak postprandial:   less than 180 mg/dL (1-2 hours)      Critically ill patients:  140 - 180 mg/dL  Results for [name] [name], [name] [name] (MRN [id number]) as of 11/21/2015 09:04  Ref. Range 11/20/2015 07:09 11/20/2015 11:25 11/20/2015 16:23 11/20/2015 21:34 11/21/2015 06:45 11/21/2015 07:12 11/21/2015 07:38  Glucose-Capillary Latest Ref Range: 65-99 mg/dL 254 (H) 270 (H) 623 (H) 256 (H) 49 (L) 46 (L) 92   Review of Glycemic Control  Diabetes history: DM2 Outpatient Diabetes medications: Januvia 100 mg daily, Levemir 10 units QHS Current orders for Inpatient glycemic control: Levemir 10 units QHS, Novolog 0-15 units TID with meals  Inpatient Diabetes Program Recommendations: Insulin - Basal: Fasting glucose 42 mg/dl this morning. Please consider decreasing Levemir to 4 units QHS. According to RN note today at 7:40 am, patient stated she always went low when she takes her Levemir at night. Talked with patient and she confirms she is having hypoglycemia almost daily and is eating more food than she wants just to keep glucose up. Please consider decreasing outpatient dose of Levemir at time of discharge. Correction (SSI): Please consider decreasing Novolog correction scale to sensitive correction scale. HgbA1C: Please consider ordeirng an A1C to evaluate glycemic control over the past 2-3 months.  NOTE: Spoke with patient over the phone about diabetes and home regimen for diabetes control. Patient reports that she goes to the Urgent Care for primary care but she would like to find a PCP locally to establish care with. Informed patient a request for Case Management would be ordered for assistance with finding a PCP in the area taking new patients with Medicare. Patient reports that she is taking Levemir 10 units QHS and Januvia 100 mg daily as an  outpatient for diabetes control. Patient reports that she is taking insulin as prescribed.  Patient states that she checks her glucose 3-4 times per day and that it is usually low in the mornings (as low as 45 mg/dl) and goes up 762-831 mg/dl during the day. Inquired about hypoglycemia and patient states that she experiences hypoglycemia at least 4 times per week and she tries to eat more food that she normally would to keep glucose up. Patient reports that her "sugar bottoms out" if she does not eat.  Inquired about prior A1C and she reports that her last A1C was 8%.  Reviewed what an A1C is and discussed glucose and A1C goals. Discussed importance of checking CBGs and maintaining good CBG control to prevent long-term and short-term complications. Encouraged patient to locate a PCP to establish care with so she can follow up regularly and improve glycemic control. Explained to the patient that she may need to have DM medications adjusted especially since she is having frequent hypoglycemic episodes and feels she has to eat more just to keep glucose from going low.  Encouraged patient to check her glucose 4 times per day (before meals and at bedtime) and to keep a log book of glucose readings and insulin taken which she will need to take to doctor appointments. Explained how the doctor she follows up with can use the log book to continue to make insulin adjustments if needed. Patient verbalized understanding of information discussed and she states that she has no further questions at this time related to diabetes.  Thanks, Orlando Penner, RN, MSN,  CDE Diabetes Coordinator Inpatient Diabetes Program [phone] (Team Pager from 8am to 5pm) [phone] (AP office) [phone] [facility] office) [phone] [facility] office)

## 2015-11-21 NOTE — Progress Notes (Signed)
Pt's cbg tonight is 94. Pt with poor appetite this evening and could not eat entire snack. Pt extremely anxious about receiving levemir insulin 10 units tonight after dropping to 46 this am after receiving levemir the previous evening. Dr. Onalee Hua paged and updated on pt's hospital events and history and requested to not give levemir this evening. Order received. Will check cbg during the night and in am.

## 2015-11-22 LAB — GLUCOSE, CAPILLARY
GLUCOSE-CAPILLARY: 126 mg/dL — AB (ref 65–99)
GLUCOSE-CAPILLARY: 145 mg/dL — AB (ref 65–99)
Glucose-Capillary: 147 mg/dL — ABNORMAL HIGH (ref 65–99)
Glucose-Capillary: 249 mg/dL — ABNORMAL HIGH (ref 65–99)
Glucose-Capillary: 164 mg/dL — ABNORMAL HIGH (ref 65–99)
Glucose-Capillary: 159 mg/dL — ABNORMAL HIGH (ref 65–99)

## 2015-11-22 LAB — BASIC METABOLIC PANEL
ANION GAP: 10 (ref 5–15)
BUN: 20 mg/dL (ref 6–20)
CHLORIDE: 107 mmol/L (ref 101–111)
CO2: 22 mmol/L (ref 22–32)
CREATININE: 1.15 mg/dL — AB (ref 0.44–1.00)
Calcium: 9 mg/dL (ref 8.9–10.3)
GFR calc non Af Amer: 48 mL/min — ABNORMAL LOW (ref >60)
GFR, EST AFRICAN AMERICAN: 56 mL/min — AB (ref >60)
Glucose, Bld: 164 mg/dL — ABNORMAL HIGH (ref 65–99)
POTASSIUM: 4.2 mmol/L (ref 3.5–5.1)
SODIUM: 139 mmol/L (ref 135–145)

## 2015-11-22 LAB — PROTIME-INR
INR: 1.28 (ref 0.00–1.49)
Prothrombin Time: 16.1 seconds — ABNORMAL HIGH (ref 11.6–15.2)

## 2015-11-22 MED ORDER — ONDANSETRON HCL 4 MG/2ML IJ SOLN
4.0000 mg | Freq: Once | INTRAMUSCULAR | Status: AC
  Administered 2015-11-22: 06:00:00 4 mg via INTRAVENOUS

## 2015-11-22 MED ORDER — WARFARIN SODIUM 5 MG PO TABS
5.0000 mg | ORAL_TABLET | Freq: Once | ORAL | Status: AC
  Administered 2015-11-22: 18:00:00 5 mg via ORAL
  Filled 2015-11-22: qty 1

## 2015-11-22 MED ORDER — WARFARIN SODIUM 2.5 MG PO TABS: ORAL_TABLET | ORAL | Status: DC

## 2015-11-22 MED ORDER — INSULIN DETEMIR 100 UNIT/ML ~~LOC~~ SOLN
10.0000 [IU] | Freq: Every day | SUBCUTANEOUS | Status: DC
Start: 2015-11-22 — End: 2015-11-22
  Filled 2015-11-22: qty 0.1

## 2015-11-22 MED ORDER — ENOXAPARIN SODIUM 60 MG/0.6ML ~~LOC~~ SOLN: 60.0000 mg | SUBCUTANEOUS | Status: DC

## 2015-11-22 NOTE — Consult Note (Signed)
WOC wound consult note Reason for Consult: Consult requested for left 3-4 toe spaces.  Pt had a wound prior to admission which was followed by podiatry and was ordered to apply neosporin to the affected areas Q day. She had a revascularization procedure performed which should increase blood flow and healing potential to the LLE.  Location is very painful to touch. Wound type: Full thickness Measurement: Toe is darker-colored skin with pink moist wounds to inner toe webbing spaces, each less than .3X.2X.1cm  Drainage (amount, consistency, odor) No odor or drainage Dressing procedure/placement/frequency:  Continue present plan of care with neosporin and nonadherent dressing Q day.  Pt should follow-up with the podiatrist and vascular team after discharge for continued assessment. Pt denies further questions. Please re-consult if further assistance is needed.  Thank-you,  Cammie Mcgee MSN, RN, CWOCN, [place], CNS [phone]

## 2015-11-22 NOTE — Progress Notes (Addendum)
Paged  [place], [place] regarding run of v. Tach (12 beats) then converted back to a.fib. Vital signs stable, patient asymptomatic.

## 2015-11-22 NOTE — Discharge Summary (Signed)
Discharge Summary    Patient ID: [name] [name],  MRN: [id number], DOB/AGE: 1945-11-14 70 y.o.  Admit date: 11/20/2015 Discharge date: 11/23/2015  Primary Care Provider: [name] Primary Cardiologist: Dr. Leatha Gilding  Discharge Diagnoses    Principal Problem:   Critical lower limb ischemia Active Problems:   PVD   Atrial fibrillation (HCC)   BRADYCARDIA-TACHYCARDIA SYNDROME   Type II diabetes mellitus (HCC)   Hypoglycemia   Mitral stenosis   Cerebral artery occlusion with cerebral infarction (HCC)   PACEMAKER, PERMANENT   Allergies Allergies  Allergen Reactions  . Bactrim [Sulfamethoxazole-Trimethoprim] Itching  . Aspirin Other (See Comments)    GI upset  . Shellfish Allergy Hives    Also nausea  . Codeine Hives and Nausea Only  . Latex Rash  . Penicillins Nausea And Vomiting and Rash    Has patient had a PCN reaction causing immediate rash, facial/tongue/throat swelling, SOB or lightheadedness with hypotension:No Has patient had a PCN reaction causing severe rash involving mucus membranes or skin necrosis:No Has patient had a PCN reaction that required hospitalization:No Has patient had a PCN reaction occurring within the last 10 years:cannot recall If all of the above answers are "NO", then may proceed with Cephalosporin use.   . Tape Itching and Rash    Please use "paper" tape    Diagnostic Studies/Procedures    LE angiography 11/20/2015  Conclusion    1. No significant aortoiliac disease. 2. Heavily calcified lower extremity arteries. On the left side, the SFA is patent with no obstructive disease. The popliteal artery has moderate stenosis. One-vessel runoff via the peroneal artery with significant disease in the TP trunk. Short occlusion of the left anterior tibial artery at the ostium. 3. Severe stenosis in the distal right SFA with 1 vessel runoff below the knee via the anterior tibial artery. 4. Successful angioplasty of the left  TP trunk with inability to wire the anterior tibial artery antegrade.  Recommendations: This was overall a difficult procedure due to heavy calcifications. I'm hoping that the left peroneal artery might now provide enough blood flow to heal the ulcers after balloon angioplasty of the left TP trunk. However, she does not have optimal flow to the foot and ideally opening the anterior tibial artery should be the goal. If she does not feel after this procedure, options include retrograde access via the dorsalis pedis. On the right side, atherectomy of the right SFA and balloon angioplasty can be considered.  Resume or warfarin tonight but not Lovenox. Lovenox can be resumed tomorrow if no bleeding complications. I'm going to hydrate the patient overnight given she has history of kidney transplant.      _____________   History of Present Illness     [name] [name] is a 70 y.o. female who was referred by Dr. Excell Seltzer for evaluation of critical limb ischemia. She has known history of permanent atrial fibrillation, renal failure status post kidney transplant, mitral stenosis, and lower extremity peripheral arterial disease. She was seen by me in 2014 for severe left calf claudication associated with ulceration involving the left foot.  She underwent angiography in 11/2012 which confirmed severe calcified disease in the left popliteal artery as well as the SFA. She underwent a complex procedure with atherectomy of the left popliteal artery and the left SFA. There was also one-vessel runoff below the knee with only patent anterior tibial artery which had diffuse 60% disease. She has done well up until recently when she developed  recurrent ulceration on left 2 small toes. She was seen by podiatry and has been placed on antibiotics. There is some mild drainage. She also has an ingrown toenail on the right big toe with some ulceration in that area. She is having breast pain affecting the left  foot. Noninvasive vascular evaluation showed that the ABIs could not be calculated due to noncompressible vessels. There was possible focal left SFA occlusion and midright SFA occlusion.  Hospital Course     Consultants: Wound care consult, case manager consult  She underwent lower extremity angiography on 11/20/2015 which showed patent left SFA, one vessel runoff via peroneal artery with significant disease in TP trunk, short occlusion of the left anterior tibial artery, severe stenosis of distal right SFA with one-vessel runoff. She underwent a successful angioplasty of the left TP trunk with the inability to wire the anterior tibial artery. She was originally planned to be discharged on 11/21/2015 with Lovenox and Coumadin bridge. Unfortunately, she has significant social issues and recent fall that prevent her from taking care of herself. We have consulted social worker, we have also arranged home health nurse, nursing aide, PT, and a social work to help assist her at home. She has trouble using her right arm to do Lovenox injection, we will ask home health nurse to help with injection. I have consulted pharmacy for recommendation on correct dosage of Lovenox.  She has a Coumadin clinic follow-up next Monday. She has a follow-up arranged with [name] in the office next week. She was seen in the morning of 11/22/2015, she is deemed stable for discharge from cardiology perspective however her ride was unavailable and has stayed until the morning of 3/18.  She has remained stable. We have consulted physical therapy who recommended home health PT. Wound care has also been consulted, who recommended patient to follow-up with podiatrist and vascular team after discharge for continued assessment. Case manager consulted to help establish primary care physician for this patient who obviously will require multidisciplinary care.  Her hospitalization has been complicated by hypoglycemia, I will discontinue her  levemir as current risk of hypoglycemia outweighs the risk of hyperglycemia. She will need to follow-up with her PCP for further management with diabetes.  _____________  Discharge Vitals Blood pressure 138/80, pulse 57, temperature 97.9 F (36.6 C), temperature source Oral, resp. rate 18, height 5' (1.524 m), weight 100 lb 12 oz (45.7 kg), SpO2 98 %.  Filed Weights   11/21/15 0329 11/22/15 0127 11/23/15 0423  Weight: 93 lb 4.1 oz (42.3 kg) 93 lb 7.6 oz (42.4 kg) 100 lb 12 oz (45.7 kg)    Labs & Radiologic Studies     CBC  Recent Labs  11/21/15 0425  WBC 6.3  HGB 11.5*  HCT 34.9*  MCV 99.1  PLT 150   Basic Metabolic Panel  Recent Labs  11/21/15 0425 11/22/15 0350  NA 139 139  K 3.9 4.2  CL 108 107  CO2 22 22  GLUCOSE 122* 164*  BUN 16 20  CREATININE 1.05* 1.15*  CALCIUM 8.7* 9.0    Dg Chest 2 View  11/17/2015  CLINICAL DATA:  Fall with right-sided pain.  Initial encounter. EXAM: CHEST  2 VIEW COMPARISON:  11/04/2010 FINDINGS: Chronic cardiomegaly, likely progressed. There is a single chamber pacer lead into the right ventricle from the right. Negative aortic and hilar contours. There is no edema, consolidation, effusion, or pneumothorax. There is are innumerable nodular densities bilaterally which appear calcified and are new or  progressed. Diffuse vascular calcification. Patient has history of primary hyperparathyroidism, accounting for surgical clips at the thoracic inlet. IMPRESSION: 1. No acute or posttraumatic finding. 2. Nodular pulmonary calcification which has developed/progressed since 2012. Favor metastatic pulmonary calcification in this patient with primary hyperparathyroidism. Electronically Signed   By: Marnee Spring M.D.   On: 11/17/2015 23:37   Ct Head Wo Contrast  11/17/2015  CLINICAL DATA:  Multiple recent falls. Recent Lovenox and ejection. LEFT headache. RIGHT head pain. No loss of consciousness. History of chronic kidney disease, diabetes,  hypertension. EXAM: CT HEAD WITHOUT CONTRAST TECHNIQUE: Contiguous axial images were obtained from the base of the skull through the vertex without intravenous contrast. COMPARISON:  MRI of the head August 18, 2009 FINDINGS: Small area RIGHT frontoparietal encephalomalacia corresponding to old infarct. Small old bilateral cerebellar infarcts. No intraparenchymal hemorrhage, mass effect, midline shift or acute large vascular territory infarcts. Ventricles and sulci are overall normal for patient's age. Very mild chronic small vessel ischemic disease, less than expected for age. No abnormal extra-axial fluid collections. Basal cisterns are patent. Extensive vascular and dural calcifications. 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 Severe calcific atherosclerosis of the aortoiliac vessels. LEFT maxillary mucosal retention cyst without paranasal sinus air-fluid levels. Moderate temporomandibular osteoarthrosis. Ocular globes L orbital contents are non-suspicious. IMPRESSION: No acute intracranial process. Old small RIGHT MCA territory infarct and old small cerebellar infarcts. Minimal chronic small vessel ischemic disease. Electronically Signed   By: Awilda Metro M.D.   On: 11/17/2015 23:41   Dg Foot 2 Views Left  10/29/2015  AP and lateral x-rays of the right foot were taken.  No evidence of bony pathology.  There are multiple areas of calcified vessels in leg and foot.  No fracture dislocation or tumor noted.  Bone density appears normal for patients age and sex.  Joint space appears normal.  No frank deformity  Helane Gunther DPM  Dg Foot 2 Views Right  10/29/2015  AP and lateral x-rays of the right foot were taken.  No evidence of bony pathology.  There are multiple areas of calcified vessels in leg and foot.  No fracture dislocation or tumor noted.  Bone density appears normal for patients age and sex.  Joint space appears normal.  No frank deformity.   Disposition    Pt is being discharged home today in good condition.  Follow-up Plans & Appointments    Follow-up Information    Follow up with [facility].   Why:  Home Health RN, Physical Therapy, aide and Social Worker   Contact information:   [phone]      Schedule an appointment as soon as possible for a visit with Mills[place], Joelene Millin, NP.   Contact information:   [facility] [address] [place] [address] [phone]       Follow up with [facility] On 11/26/2015.   Specialty:  Cardiology   Why:  followup with [name]. 9:45AM.    Contact information:   [address] [place] [address] [phone]      Follow up with [facility] On 11/25/2015.   Specialty:  Cardiology   Why:  Followup with coumadin clinic at 1:15PM.    Contact information:   [address] [place] [address] [phone]      Call [facility] [place].   Why:  Call to schedule a follow up appointment as  soon as possible to establish care    Contact information:   [address] [place], [place] [address] [phone]      Discharge Instructions    Diet - low sodium heart healthy    Complete by:  As directed      Increase activity slowly    Complete by:  As directed            Discharge Medications   Current Discharge Medication List    START taking these medications   Details  atorvastatin (LIPITOR) 40 MG tablet Take 1 tablet (40 mg total) by mouth daily. Qty: 30 tablet, Refills: 6      CONTINUE these medications which have CHANGED   Details  enoxaparin (LOVENOX) 60 MG/0.6ML injection Inject 0.6 mLs (60 mg total) into the skin daily. Qty: 6 Syringe, Refills: 0    warfarin (COUMADIN) 2.5 MG tablet TAKE AS DIRECTED BY ANTICOAGULATION CLINIC Qty: 30 tablet, Refills: 3      CONTINUE these medications which have NOT CHANGED   Details   acetaminophen (TYLENOL) 500 MG tablet Take 500 mg by mouth every 6 (six) hours as needed. For pain    amLODipine (NORVASC) 5 MG tablet Take 1 tablet (5 mg total) by mouth daily. Qty: 90 tablet, Refills: 0    azelastine (OPTIVAR) 0.05 % ophthalmic solution Place 1 drop into both eyes 2 (two) times daily as needed (for dry eyes).     b complex-vitamin c-folic acid (NEPHRO-VITE) 0.8 MG TABS Take 1 tablet by mouth daily.     Calcium Carbonate-Vitamin D (CALTRATE 600+D) 600-400 MG-UNIT per tablet Take 1 tablet by mouth 2 (two) times daily.     clindamycin (CLEOCIN) 150 MG capsule Take 3 capsules (450 mg total) by mouth every 6 (six) hours. Qty: 90 capsule, Refills: 0    clopidogrel (PLAVIX) 75 MG tablet Take 1 tablet (75 mg total) by mouth daily. Qty: 30 tablet, Refills: 11    cycloSPORINE modified (NEORAL/GENGRAF) 25 MG capsule Take 25 mg by mouth 2 (two) times daily.      digoxin (LANOXIN) 0.125 MG tablet Take 0.125 mg by mouth daily.     ergocalciferol (VITAMIN D2) 50000 UNITS capsule Take 50,000 Units by mouth every 30 (thirty) days. First of the month    fluticasone (FLONASE) 50 MCG/ACT nasal spray Place 2 sprays into the nose daily.    furosemide (LASIX) 20 MG tablet TAKE 1 TABLET BY MOUTH EVERY OTHER DAY Qty: 30 tablet, Refills: 0    HYDROcodone-acetaminophen (NORCO/VICODIN) 5-325 MG tablet Take 1 tablet by mouth every 4 (four) hours as needed for moderate pain or severe pain. Qty: 20 tablet, Refills: 0    hydroxypropyl methylcellulose (ISOPTO TEARS) 2.5 % ophthalmic solution Place 1 drop into both eyes 4 (four) times daily as needed (dry eyes).    iron polysaccharides (NIFEREX) 150 MG capsule Take 150 mg by mouth daily.     lidocaine (XYLOCAINE) 5 % ointment Apply 1 application topically as needed. For foot pain Qty: 35.44 g, Refills: 0   Associated Diagnoses: Foot pain, bilateral    loratadine (CLARITIN) 10 MG tablet Take 10 mg by mouth daily as needed for allergies.      metoprolol (LOPRESSOR) 100 MG tablet TAKE 1 TABLET BY MOUTH TWICE A DAY Qty: 60 tablet, Refills: 0    Multiple Vitamins-Minerals (ONE-A-DAY WOMENS 50 PLUS PO) Take 1 tablet by mouth daily.    mycophenolate (CELLCEPT) 250 MG capsule Take 500 mg by mouth 2 (two)  times daily.     predniSONE (DELTASONE) 5 MG tablet Take 5 mg by mouth daily.      sitaGLIPtin (JANUVIA) 100 MG tablet Take 100 mg by mouth daily.      traMADol (ULTRAM) 50 MG tablet Take 1 tablet (50 mg total) by mouth every 8 (eight) hours as needed for moderate pain. Qty: 60 tablet, Refills: 0    valACYclovir (VALTREX) 500 MG tablet Take 500 mg by mouth every Monday, Wednesday, and Friday. Only on Monday, Wednesday and Friday    albuterol (PROVENTIL) (2.5 MG/3ML) 0.083% nebulizer solution Take 2.5 mg by nebulization every 6 (six) hours as needed for wheezing or shortness of breath.    ONE TOUCH ULTRA TEST test strip TEST BLOOD SUGAR 3 TIMES A DAY (E11.9) Refills: 3      STOP taking these medications     insulin detemir (LEVEMIR) 100 UNIT/ML injection           Outstanding Labs/Studies   Coumadin clinic visit on Monday  Duration of Discharge Encounter   Greater than 30 minutes including physician time.  Signed, Nicolasa Ducking, NP 11/23/2015, 9:13 AM

## 2015-11-22 NOTE — Progress Notes (Signed)
Pt with c/o nausea but no vomiting. Pt refused medication at first but nausea did not subside after gingerale and waiting 20 mins so one time order received for zofran 4mg  per Dr. Onalee Hua. Pt's cbg checked now and was 145. Also checked cbg at 0125 since she did not receive levemir last night and it was 126.

## 2015-11-22 NOTE — Clinical Documentation Improvement (Addendum)
Cardiology  (Please document query responses in the current medical record, not on the CDI BPA form. Thank you.)  The registered dietician assessment dated 11/21/15 by Romana Juniper, documents a diagnosis of severe malnutrition in the context of chronic illness, evidenced by severe depletion of muscle mass and severe depletion of body fat.   The intervention for severe malnutrition is Ensure Enlive po BID.  Possible Clinical Conditions:  - Severe Malnutrition in the context of chronic illness  - Other type of malnutrition  - Other diagnosis  - Unable to clinically determine   Clinical Information: RD Assessment   11/21/15 at 1:23 pm Severe malnutrition in context of chronic illness INTERVENTION:  -Ensure Enlive po BID, each supplement provides 350 kcal and 20 grams of protein NUTRITION DIAGNOSIS:  Malnutrition related to chronic illness as evidenced by energy intake < 75% for > or equal to 1 month, severe depletion of muscle mass, severe depletion of body fat.   Please exercise your independent, professional judgment when responding. A specific answer is not anticipated or expected.   Thank You, Jerral Ralph  RN BSN CCDS [phone] Health Information Management [place]

## 2015-11-22 NOTE — Progress Notes (Signed)
Initial discharge pick up arrangement with daughter was  after 6 pm tonight. 1930  Pt stated daughter called in and  unable to pick her up , stated daughter's children are sick ,will pick up patient in AM . Azalee Course PA notified/updated.

## 2015-11-22 NOTE — Evaluation (Signed)
Physical Therapy Evaluation Patient Details Name: [name] [name] MRN: [id number] DOB: 21-Sep-1945 Today's Date: 11/22/2015   History of Present Illness  70 yo with kidney transplant Dec 2016 was admitted for LLE pain and fall, poorly healing L foot (toe) ulcers.  PV balloon angioplasty for L TP trunk, with subtherapeutic anticoagulation.  PMHx:  pacemaker, stroke L MCA, HTN, abdominal aortagram, PAD  Clinical Impression  Pt is clinically tolerating gait with slow start due to pain and lack of footwear to protect L foot.  H HPT is scheduled and will have her daughter checking on her to transition home as HHPT not likely to be available before Monday.  Pt is given goals for PT to ensure follow through in case she is not released today.    Follow Up Recommendations Home health PT;Supervision - Intermittent    Equipment Recommendations  Rolling walker with 5" wheels;3in1 (PT)    Recommendations for Other Services Rehab consult     Precautions / Restrictions Precautions Precautions: Fall Restrictions Weight Bearing Restrictions: No      Mobility  Bed Mobility Overal bed mobility: Modified Independent;Needs Assistance Bed Mobility: Supine to Sit;Sit to Supine     Supine to sit: Min guard;Min assist Sit to supine: Modified independent (Device/Increase time)   General bed mobility comments: after moving around was more independently mobile  Transfers Overall transfer level: Modified independent Equipment used: 4-wheeled walker;1 person hand held assist (for safety)             General transfer comment: reminders 100% for hand placement but then follows through  Ambulation/Gait Ambulation/Gait assistance: Min guard (for safety) Ambulation Distance (Feet): 40 Feet Assistive device: 4-wheeled walker;1 person hand held assist (for safety) Gait Pattern/deviations: Step-to pattern;Step-through pattern;Decreased stride length;Decreased stance time - left;Shuffle;Narrow base of  support;Trunk flexed Gait velocity: reduced Gait velocity interpretation: Below normal speed for age/gender General Gait Details: Pt is guarded but more fluid gait after being up to walk  Stairs            Wheelchair Mobility    Modified Rankin (Stroke Patients Only)       Balance Overall balance assessment: Modified Independent;Needs assistance Sitting-balance support: Feet supported Sitting balance-Leahy Scale: Good   Postural control: Posterior lean Standing balance support: Bilateral upper extremity supported Standing balance-Leahy Scale: Fair Standing balance comment: less than fair once walking                             Pertinent Vitals/Pain Pain Assessment: 0-10 Pain Score: 4  Pain Location: L foot and leg Pain Descriptors / Indicators: Aching;Operative site guarding Pain Intervention(s): Limited activity within patient's tolerance;Monitored during session;Premedicated before session;Repositioned    Home Living Family/patient expects to be discharged to:: Private residence Living Arrangements: Alone Available Help at Discharge: Family;Available PRN/intermittently Type of Home: House Home Access: Level entry     Home Layout: One level Home Equipment: Walker - 2 wheels;Walker - 4 wheels;Cane - quad      Prior Function Level of Independence: Independent with assistive device(s)         Comments: used rollator mainly for gait     Hand Dominance        Extremity/Trunk Assessment   Upper Extremity Assessment: Overall WFL for tasks assessed           Lower Extremity Assessment: LLE deficits/detail   LLE Deficits / Details: tolerates limited pressure with rollator used to support balance and wbing  Cervical / Trunk Assessment: Normal  Communication   Communication: No difficulties  Cognition Arousal/Alertness: Awake/alert Behavior During Therapy: WFL for tasks assessed/performed Overall Cognitive Status: Within Functional  Limits for tasks assessed                      General Comments General comments (skin integrity, edema, etc.): Pt is careful with gait, very limited strides and reluctant to pressure LLE due to foot ulcers.  HHPT agreed to by pt and reports her family cannot stay with her to assist at home though daughter will come to help her initially    Exercises        Assessment/Plan    PT Assessment Patient needs continued PT services  PT Diagnosis Difficulty walking   PT Problem List Decreased strength;Decreased range of motion;Decreased activity tolerance;Decreased balance;Decreased mobility;Decreased coordination;Decreased knowledge of use of DME;Decreased safety awareness;Cardiopulmonary status limiting activity;Decreased skin integrity;Pain  PT Treatment Interventions     PT Goals (Current goals can be found in the Care Plan section) Acute Rehab PT Goals Patient Stated Goal: To get home PT Goal Formulation: With patient Time For Goal Achievement: 11/29/15 Potential to Achieve Goals: Good    Frequency Min 3X/week   Barriers to discharge Decreased caregiver support has sporadic family help    Co-evaluation               End of Session   Activity Tolerance: Patient tolerated treatment well;Patient limited by fatigue Patient left: in bed;with call bell/phone within reach;with bed alarm set Nurse Communication: Mobility status;Other (comment) (needs HHPT)         Time: 4098-1191 PT Time Calculation (min) (ACUTE ONLY): 27 min   Charges:   PT Evaluation $PT Eval Moderate Complexity: 1 Procedure PT Treatments $Gait Training: 8-22 mins   PT G Codes:        Ivar Drape 19-Dec-2015, 12:12 PM   Samul Dada, PT MS Acute Rehab Dept. Number: [id number] and [facility] [phone]

## 2015-11-22 NOTE — Discharge Instructions (Signed)
No lifting over 5 lbs for 1 week. No sexual activity for 1 week. Keep procedure site clean & dry. If you notice increased pain, swelling, bleeding or pus, call/return!  You may shower, but no soaking baths/hot tubs/pools for 1 week °

## 2015-11-22 NOTE — Progress Notes (Signed)
ANTICOAGULATION CONSULT NOTE  Pharmacy Consult for Warfarin Indication: atrial fibrillation  Allergies  Allergen Reactions  . Bactrim [Sulfamethoxazole-Trimethoprim] Itching  . Aspirin Other (See Comments)    GI upset  . Shellfish Allergy Hives    Also nausea  . Codeine Hives and Nausea Only  . Latex Rash  . Penicillins Nausea And Vomiting and Rash    Has patient had a PCN reaction causing immediate rash, facial/tongue/throat swelling, SOB or lightheadedness with hypotension:No Has patient had a PCN reaction causing severe rash involving mucus membranes or skin necrosis:No Has patient had a PCN reaction that required hospitalization:No Has patient had a PCN reaction occurring within the last 10 years:cannot recall If all of the above answers are "NO", then may proceed with Cephalosporin use.   . Tape Itching and Rash    Please use "paper" tape    Patient Measurements: Height: 5' (152.4 cm) Weight: 93 lb 7.6 oz (42.4 kg) IBW/kg (Calculated) : 45.5   Vital Signs: Temp: 98.2 F (36.8 C) (03/17 0816) Temp Source: Oral (03/17 0816) BP: 128/79 mmHg (03/17 0816) Pulse Rate: 72 (03/17 0816)  Labs:  Recent Labs  11/20/15 0726 11/21/15 0425 11/22/15 0350  HGB  --  11.5*  --   HCT  --  34.9*  --   PLT  --  150  --   LABPROT 16.6* 17.1* 16.1*  INR 1.33 1.39 1.28  CREATININE  --  1.05* 1.15*    Estimated Creatinine Clearance: 31.8 mL/min (by C-G formula based on Cr of 1.15).  Assessment: 69yof admitted for peripheral vascular balloon angioplasty L TP trunk. She is on chronic coumadin for history of afib. INR remains subtherapeutic at 1.28. H/H slightly low and platelets are WNL as of 3/16. No bleeding noted. Therapeutic lovenox started yesteday. Dosed as 1.5mg /kg/day for ease of outpatient administration. However, renal function is borderline so dose may require adjustment if CrCl fall below 30 ml/min.    PTA warfarin 1.25mg  Tues/Sat and 2.5mg  all other days  Goal of  Therapy:  INR 2-3   Plan:  - Give boosted dose of warfarin 5mg  PO x 1 tonight - Daily INR - Continue lovenox 1.5mg /kg/day for now, if CrCl falls below 100ml/min, reduce dose to 40mg  SQ daily  Lysle Pearl, PharmD, BCPS Pager # [phone] 11/22/2015 9:47 AM

## 2015-11-23 DIAGNOSIS — E162 Hypoglycemia, unspecified: Secondary | ICD-10-CM

## 2015-11-23 DIAGNOSIS — E1165 Type 2 diabetes mellitus with hyperglycemia: Secondary | ICD-10-CM

## 2015-11-23 DIAGNOSIS — I482 Chronic atrial fibrillation: Secondary | ICD-10-CM

## 2015-11-23 DIAGNOSIS — I998 Other disorder of circulatory system: Secondary | ICD-10-CM

## 2015-11-23 LAB — PROTIME-INR
INR: 1.88 — AB (ref 0.00–1.49)
Prothrombin Time: 21.5 seconds — ABNORMAL HIGH (ref 11.6–15.2)

## 2015-11-23 LAB — GLUCOSE, CAPILLARY: GLUCOSE-CAPILLARY: 142 mg/dL — AB (ref 65–99)

## 2015-11-23 MED ORDER — ATORVASTATIN CALCIUM 40 MG PO TABS: 40.0000 mg | ORAL_TABLET | Freq: Every day | ORAL | Status: DC

## 2015-11-23 MED ORDER — WARFARIN SODIUM 2.5 MG PO TABS: ORAL_TABLET | ORAL | Status: DC

## 2015-11-23 NOTE — Progress Notes (Signed)
ANTICOAGULATION CONSULT NOTE  Pharmacy Consult for Warfarin/Enoxaparin Indication: atrial fibrillation  Allergies  Allergen Reactions  . Bactrim [Sulfamethoxazole-Trimethoprim] Itching  . Aspirin Other (See Comments)    GI upset  . Shellfish Allergy Hives    Also nausea  . Codeine Hives and Nausea Only  . Latex Rash  . Penicillins Nausea And Vomiting and Rash    Has patient had a PCN reaction causing immediate rash, facial/tongue/throat swelling, SOB or lightheadedness with hypotension:No Has patient had a PCN reaction causing severe rash involving mucus membranes or skin necrosis:No Has patient had a PCN reaction that required hospitalization:No Has patient had a PCN reaction occurring within the last 10 years:cannot recall If all of the above answers are "NO", then may proceed with Cephalosporin use.   . Tape Itching and Rash    Please use "paper" tape    Patient Measurements: Height: 5' (152.4 cm) Weight: 100 lb 12 oz (45.7 kg) IBW/kg (Calculated) : 45.5   Vital Signs: Temp: 97.9 F (36.6 C) (03/18 0744) Temp Source: Oral (03/18 0744) BP: 138/80 mmHg (03/18 0744) Pulse Rate: 63 (03/18 0931)  Labs:  Recent Labs  11/21/15 0425 11/22/15 0350 11/23/15 0248  HGB 11.5*  --   --   HCT 34.9*  --   --   PLT 150  --   --   LABPROT 17.1* 16.1* 21.5*  INR 1.39 1.28 1.88*  CREATININE 1.05* 1.15*  --     Estimated Creatinine Clearance: 34.1 mL/min (by C-G formula based on Cr of 1.15).  Assessment: [name] [name] admitted for peripheral vascular balloon angioplasty L TP trunk. She is on chronic coumadin for history of afib. INR remains subtherapeutic but big jump with little boost last pm 1.28>1.88 after warfarin . H/H slightly low and platelets are WNL as of 3/16. No bleeding noted. Therapeutic lovenox started yesteday. Dosed as 1.5mg /kg/day for ease of outpatient administration.   PTA warfarin 1.25mg  Tues/Sat and 2.5mg  all other days  Goal of Therapy:  INR 2-3   Plan:   - restart home dose  warfarin 2.5mg  PO qd except 1.25 Tues/Sat - Daily INR - Continue lovenox 1.5mg /kg/day for now,  [name] [name] Pharm.D. CPP, BCPS Clinical Pharmacist [phone] 11/23/2015 10:48 AM

## 2015-11-23 NOTE — Progress Notes (Signed)
Patient Name: [name] [name] Date of Encounter: 11/23/2015  Hospital Problem List     Principal Problem:   Critical lower limb ischemia Active Problems:   PVD   Atrial fibrillation (HCC)   BRADYCARDIA-TACHYCARDIA SYNDROME   Type II diabetes mellitus (HCC)   Hypoglycemia   Mitral stenosis   Cerebral artery occlusion with cerebral infarction (HCC)   PACEMAKER, PERMANENT   Subjective   Pts ride fell through last night.  She continues to c/o right sided pain s/p fall earlier this month.  No left leg/foot pain.  Inpatient Medications    . amLODipine  5 mg Oral Daily  . clindamycin  450 mg Oral TID PC & HS  . clopidogrel  75 mg Oral Daily  . cycloSPORINE modified  25 mg Oral BID  . digoxin  0.125 mg Oral Daily  . enoxaparin (LOVENOX) injection  60 mg Subcutaneous Q24H  . feeding supplement (ENSURE ENLIVE)  237 mL Oral BID BM  . furosemide  20 mg Oral QODAY  . insulin aspart  0-9 Units Subcutaneous TID WC  . iron polysaccharides  150 mg Oral Daily  . metoprolol  100 mg Oral BID  . mycophenolate  500 mg Oral BID  . predniSONE  5 mg Oral QAC breakfast  . sodium chloride flush  3 mL Intravenous Q12H  . valACYclovir  500 mg Oral Q M,W,F  . [START ON 12/07/2015] Vitamin D (Ergocalciferol)  50,000 Units Oral Q30 days  . Warfarin - Pharmacist Dosing Inpatient   Does not apply q1800    Vital Signs    Filed Vitals:   11/22/15 1343 11/22/15 1924 11/23/15 0423 11/23/15 0744  BP: 120/72 110/87 158/84 138/80  Pulse: 79 79 65 57  Temp: 98.4 F (36.9 C) 97.9 F (36.6 C) 97.5 F (36.4 C) 97.9 F (36.6 C)  TempSrc: Oral Oral Oral Oral  Resp: 18 16 19 18   Height:      Weight:   100 lb 12 oz (45.7 kg)   SpO2: 96% 91% 97% 98%    Intake/Output Summary (Last 24 hours) at 11/23/15 0842 Last data filed at 11/23/15 1610  Gross per 24 hour  Intake    240 ml  Output    800 ml  Net   -560 ml   Filed Weights   11/21/15 0329 11/22/15 0127 11/23/15 0423  Weight: 93 lb 4.1 oz (42.3  kg) 93 lb 7.6 oz (42.4 kg) 100 lb 12 oz (45.7 kg)    Physical Exam    General: Pleasant, NAD. Neuro: Alert and oriented X 3. Moves all extremities spontaneously. Psych: Normal affect. HEENT:  Normal  Neck: Supple without bruits or JVD. Lungs:  Resp regular and unlabored, basilar crackles. Heart: IR, IR no s3, s4. 3/6 SEM loudest @ apex. Abdomen: Soft, non-tender, non-distended, BS + x 4.  Extremities: No clubbing, cyanosis or edema. DP/PT/Radials 1+ and equal bilaterally.  Labs    CBC  Recent Labs  11/21/15 0425  WBC 6.3  HGB 11.5*  HCT 34.9*  MCV 99.1  PLT 150   Basic Metabolic Panel  Recent Labs  11/21/15 0425 11/22/15 0350  NA 139 139  K 3.9 4.2  CL 108 107  CO2 22 22  GLUCOSE 122* 164*  BUN 16 20  CREATININE 1.05* 1.15*  CALCIUM 8.7* 9.0   Lab Results  Component Value Date   INR 1.88* 11/23/2015   INR 1.28 11/22/2015   INR 1.39 11/21/2015    Telemetry  AFib, V POD, PVC's.  Radiology    Dg Chest 2 View  11/17/2015  CLINICAL DATA:  Fall with right-sided pain.  Initial encounter. EXAM: CHEST  2 VIEW COMPARISON:  11/04/2010 FINDINGS: Chronic cardiomegaly, likely progressed. There is a single chamber pacer lead into the right ventricle from the right. Negative aortic and hilar contours. There is no edema, consolidation, effusion, or pneumothorax. There is are innumerable nodular densities bilaterally which appear calcified and are new or progressed. Diffuse vascular calcification. Patient has history of primary hyperparathyroidism, accounting for surgical clips at the thoracic inlet. IMPRESSION: 1. No acute or posttraumatic finding. 2. Nodular pulmonary calcification which has developed/progressed since 2012. Favor metastatic pulmonary calcification in this patient with primary hyperparathyroidism. Electronically Signed   By: Marnee Spring M.D.   On: 11/17/2015 23:37   Ct Head Wo Contrast  11/17/2015  CLINICAL DATA:  Multiple recent falls. Recent Lovenox  and ejection. LEFT headache. RIGHT head pain. No loss of consciousness. History of chronic kidney disease, diabetes, hypertension. EXAM: CT HEAD WITHOUT CONTRAST TECHNIQUE: Contiguous axial images were obtained from the base of the skull through the vertex without intravenous contrast. COMPARISON:  MRI of the head August 18, 2009 FINDINGS: Small area RIGHT frontoparietal encephalomalacia corresponding to old infarct. Small old bilateral cerebellar infarcts. No intraparenchymal hemorrhage, mass effect, midline shift or acute large vascular territory infarcts. Ventricles and sulci are overall normal for patient's age. Very mild chronic small vessel ischemic disease, less than expected for age. No abnormal extra-axial fluid collections. Basal cisterns are patent. Extensive vascular and dural calcifications. 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 Severe calcific atherosclerosis of the aortoiliac vessels. LEFT maxillary mucosal retention cyst without paranasal sinus air-fluid levels. Moderate temporomandibular osteoarthrosis. Ocular globes L orbital contents are non-suspicious. IMPRESSION: No acute intracranial process. Old small RIGHT MCA territory infarct and old small cerebellar infarcts. Minimal chronic small vessel ischemic disease. Electronically Signed   By: Awilda Metro M.D.   On: 11/17/2015 23:41   Dg Foot 2 Views Left  10/29/2015  AP and lateral x-rays of the right foot were taken.  No evidence of bony pathology.  There are multiple areas of calcified vessels in leg and foot.  No fracture dislocation or tumor noted.  Bone density appears normal for patients age and sex.  Joint space appears normal.  No frank deformity  Helane Gunther DPM  Dg Foot 2 Views Right  10/29/2015  AP and lateral x-rays of the right foot were taken.  No evidence of bony pathology.  There are multiple areas of calcified vessels in leg and foot.  No fracture dislocation or tumor noted.  Bone  density appears normal for patients age and sex.  Joint space appears normal.  No frank deformity.   Assessment & Plan    1.  Limb ischemia/PVD:  S/p PTA of the left tibioperoneal trunk.  Interventional team unable to wire anterior tibial artery.  Planned for d/c 3/17 however ride fell through.  Doing well.  Ready for d/c today.  She has f/u with [name] next week.  Continue plavix in addition to coumadin.  Previous intolerance to ASA.  HHRN/wound care set up.  2.  Persistent Afib:  Rate-controlled on  blocker and digoxin.  Cont coumadin/lovenox.  INR 1.88 this AM.  HHRN arranged to assist with lovenox.  3.  Hypertensive Heart Dzs: stable on ccb/ blocker.  4.  Type II DM/hypoglycemia:  Sugars improved with changes to insulin.  F/u with PCP for long  term mgmt.  5.  H/O ESRD s/p transplant:  On prednisone.  Renal fxn stable.  6.  Lipids:  Not on statin.  No listed prior intolerance.  LDL was 91 in 2010.  Discussed with pt.  Will add lipitor.  Signed, Nicolasa Ducking NP As above, patient seen and examined. She denies dyspnea or chest pain. She is doing well status post angioplasty of left tibioperoneal trunk. Right groin shows no hematoma and no bruit. She remains in atrial fibrillation.She will be discharged today on Plavix. Lovenox will be continued until INR therapeutic. Had statin for peripheral vascular disease. > 30 min PA and physician time D2 Olga Millers

## 2015-11-25 ENCOUNTER — Telehealth: Payer: Self-pay | Admitting: Cardiovascular Disease

## 2015-11-25 DIAGNOSIS — E785 Hyperlipidemia, unspecified: Secondary | ICD-10-CM

## 2015-11-25 MED ORDER — PRAVASTATIN SODIUM 20 MG PO TABS: 20.0000 mg | ORAL_TABLET | Freq: Every evening | ORAL | Status: AC

## 2015-11-25 NOTE — Telephone Encounter (Signed)
Attempted to contact patient. Her phone line goes to a VM box not set up to receive messages.  I returned the call to pharmacy, communicated med change & recommendation for labwork. Asked them to communicate this to patient when she comes for refill pickup & have patient call us if clarification required.

## 2015-11-25 NOTE — Telephone Encounter (Signed)
We don't have any current lipid labs on her.  Needs to d/c atorvastatin, start pravastatin 20 mg and have labs drawn in 3 months

## 2015-11-25 NOTE — Telephone Encounter (Signed)
Pt c/o medication issue:  1. Name of Medication: lIPITOR & NEORAL  2. How are you currently taking this medication (dosage and times per day)? NEORAL 25MG  2X DAY 3. Are you having a reaction (difficulty breathing--STAT)? NO  4. What is your medication issue? PA CHRIS BURGE PRESCRIBED THE LIPITOR 40.G   THERE IS A DRUG INTERACTION BETWEEN THE TWO MEDICATIONS

## 2015-11-25 NOTE — Telephone Encounter (Signed)
Routed to [place], PharmD for review of neoral and lipitor interaction - flagged by filling pharmacy.

## 2015-11-26 ENCOUNTER — Ambulatory Visit (INDEPENDENT_AMBULATORY_CARE_PROVIDER_SITE_OTHER): Payer: Medicare Other | Admitting: Pharmacist Clinician (PhC)/ Clinical Pharmacy Specialist

## 2015-11-26 ENCOUNTER — Ambulatory Visit (INDEPENDENT_AMBULATORY_CARE_PROVIDER_SITE_OTHER): Payer: Medicare Other | Admitting: Cardiovascular Disease

## 2015-11-26 VITALS — Ht 60.0 in | Wt 92.8 lb

## 2015-11-26 DIAGNOSIS — I4821 Permanent atrial fibrillation: Secondary | ICD-10-CM

## 2015-11-26 DIAGNOSIS — Z5181 Encounter for therapeutic drug level monitoring: Secondary | ICD-10-CM

## 2015-11-26 DIAGNOSIS — I482 Chronic atrial fibrillation: Secondary | ICD-10-CM

## 2015-11-26 DIAGNOSIS — I70229 Atherosclerosis of native arteries of extremities with rest pain, unspecified extremity: Secondary | ICD-10-CM

## 2015-11-26 DIAGNOSIS — I998 Other disorder of circulatory system: Secondary | ICD-10-CM

## 2015-11-26 DIAGNOSIS — I4891 Unspecified atrial fibrillation: Secondary | ICD-10-CM

## 2015-11-26 LAB — POCT INR: INR: 1.6

## 2015-11-26 NOTE — Patient Instructions (Signed)
Medication Instructions:  Your physician recommends that you continue on your current medications as directed. Please refer to the Current Medication list given to you today.  Labwork: No new orders.   Testing/Procedures: No new orders.   Follow-Up: Your physician recommends that you schedule a follow-up appointment in: 1 MONTH with Dr Arida   Any Other Special Instructions Will Be Listed Below (If Applicable).     If you need a refill on your cardiac medications before your next appointment, please call your pharmacy.   

## 2015-11-26 NOTE — Progress Notes (Signed)
Cardiology Office Note   Date:  11/26/2015   ID:  [name], [name] 1946/02/13, MRN [id number]  PCP:  [name], NP  Cardiologist:  Dr. Excell Seltzer.   Chief Complaint  Patient presents with  . Follow-up    no chest pain      History of Present Illness: [name] [name] is a 70 y.o. female who Is here today for a follow-up visit regarding  critical limb ischemia.  She has known history of permanent atrial fibrillation, renal failure status post kidney transplant, mitral stenosis, and lower extremity peripheral arterial disease. She was seen by me in 2014 for severe left calf claudication associated with ulceration involving the left foot.  She underwent angiography in 11/2012 which confirmed severe calcified disease in the left popliteal artery as well as the SFA. She underwent a complex procedure with atherectomy of the left popliteal artery and the left SFA. There was also one-vessel runoff below the knee with only patent anterior tibial artery which had diffuse 60% disease. She was seen recently for recurrent ulceration on left 2 small toes.  She also had an ingrown toenail on the right big toe with some ulceration in that area.  Noninvasive vascular evaluation showed that the ABIs could not be calculated due to noncompressible vessels.   I proceeded with angiography which showed patent left SFA and popliteal arteries with only moderate stenosis in the popliteal artery. Below the knee, the anterior tibial artery was occluded at the ostium. The peroneal artery was only patent vessel to the foot . There was 80% stenosis in the TP trunk at the origin of the anterior tibial artery. I performed successful balloon angioplasty on the TP trunk but was not able to cross the short occlusion in the anterior tibial artery. On the right side, there was significant heavily calcified right SFA stenosis. The patient overall is very frail. She reports improvement in foot pain on the left  side. The ulcers also might be improving.    Past Medical History  Diagnosis Date  . Hypertension   . Stroke [facility]) December 2011    left MCA distribution  . Permanent atrial fibrillation (HCC)     coumadin clinic at [facility]. Has previously required Lovenox->Coumadin bridge.  . Severe mitral stenosis by prior echocardiogram     last echo 07/2012  . Aortic stenosis   . Symptomatic bradycardia     s/p pacer  . Depression   . HSV (herpes simplex virus) infection   . Anemia   . Hyperparathyroidism, primary (HCC)     s/p parathyroidectomy  . PAD (peripheral artery disease) (HCC)     a. s/p RLE angioplasty with [name]. b. (by [name])  L popliteral artery orbital atherectomy and balloon angioplasty & L SFA orbital atherectomy and balloon angioplasty 11/30/12.  [name] PVD (peripheral vascular disease) (HCC)   . Presence of permanent cardiac pacemaker   . High cholesterol   . CHF (congestive heart failure) (HCC)   . TIA (transient ischemic attack) 08/2009    [name] 09/05/2009  . Atrial thrombus (HCC)     appendage/notes 09/05/2009  . Type II diabetes mellitus (HCC)   . History of blood transfusion     related to "kidney transplant"  . Chronic kidney disease     s/p renal transplant;  follows with [name]  . GERD (gastroesophageal reflux disease)     hx  . History of stomach ulcers   . Diabetic foot ulcer (  HCC)     "left is worse than right" (11/20/2015)    Past Surgical History  Procedure Laterality Date  . Insert / replace / remove pacemaker    . Kidney transplant  December 1999  . Abdominal angiogram  11/30/2012    ABDOMINAL AORTIC ANGIOGRAM   DR ARDIA  . Dialysis fistula creation    . Abdominal aortagram N/A 11/30/2012    Procedure: ABDOMINAL [name];  Surgeon: [name], MD;  Location: [facility] CATH LAB;  Service: Cardiovascular;  Laterality: N/A;  . Peripheral vascular catheterization N/A 11/20/2015    Procedure: Abdominal Aortogram;  Surgeon:  [name], MD;  Location: [facility] INVASIVE CV LAB;  Service: Cardiovascular;  Laterality: N/A;  . Peripheral vascular catheterization Bilateral 11/20/2015    Procedure: Lower Extremity Angiography;  Surgeon: [name], MD;  Location: [facility] INVASIVE CV LAB;  Service: Cardiovascular;  Laterality: Bilateral;  . Peripheral vascular catheterization  11/20/2015    Procedure: Peripheral Vascular Balloon Angioplasty;  Surgeon: [name], MD;  Location: [facility] INVASIVE CV LAB;  Service: Cardiovascular;;  . Tonsillectomy    . Appendectomy    . Hernia repair    . Umbilical hernia repair       Current Outpatient Prescriptions  Medication Sig Dispense Refill  . acetaminophen (TYLENOL) 500 MG tablet Take 500 mg by mouth every 6 (six) hours as needed. For pain    . albuterol (PROVENTIL) (2.5 MG/3ML) 0.083% nebulizer solution Take 2.5 mg by nebulization every 6 (six) hours as needed for wheezing or shortness of breath.    [name] amLODipine (NORVASC) 5 MG tablet Take 1 tablet (5 mg total) by mouth daily. 90 tablet 0  . azelastine (OPTIVAR) 0.05 % ophthalmic solution Place 1 drop into both eyes 2 (two) times daily as needed (for dry eyes).     [name] b complex-vitamin c-folic acid (NEPHRO-VITE) 0.8 MG TABS Take 1 tablet by mouth daily.     . Calcium Carbonate-Vitamin D (CALTRATE 600+D) 600-400 MG-UNIT per tablet Take 1 tablet by mouth 2 (two) times daily.     . clindamycin (CLEOCIN) 150 MG capsule Take 3 capsules (450 mg total) by mouth every 6 (six) hours. 90 capsule 0  . clopidogrel (PLAVIX) 75 MG tablet Take 1 tablet (75 mg total) by mouth daily. 30 tablet 11  . cycloSPORINE modified (NEORAL/GENGRAF) 25 MG capsule Take 25 mg by mouth 2 (two) times daily.      . digoxin (LANOXIN) 0.125 MG tablet Take 0.125 mg by mouth daily.     . ergocalciferol (VITAMIN D2) 50000 UNITS capsule Take 50,000 Units by mouth every 30 (thirty) days. First of the month    . fluticasone (FLONASE) 50 MCG/ACT nasal spray Place 2 sprays  into the nose daily.    . furosemide (LASIX) 20 MG tablet TAKE 1 TABLET BY MOUTH EVERY OTHER DAY 30 tablet 0  . HYDROcodone-acetaminophen (NORCO/VICODIN) 5-325 MG tablet Take 1 tablet by mouth every 4 (four) hours as needed for moderate pain or severe pain. 20 tablet 0  . hydroxypropyl methylcellulose (ISOPTO TEARS) 2.5 % ophthalmic solution Place 1 drop into both eyes 4 (four) times daily as needed (dry eyes).    . iron polysaccharides (NIFEREX) 150 MG capsule Take 150 mg by mouth daily.     [name] JANUVIA 50 MG tablet Take 1 tablet by mouth daily.    [name] LEVEMIR FLEXTOUCH 100 UNIT/ML Pen Inject 100 Units as directed at bedtime.    . lidocaine (XYLOCAINE) 5 % ointment  Apply 1 application topically as needed. For foot pain 35.44 g 0  . loratadine (CLARITIN) 10 MG tablet Take 10 mg by mouth daily as needed for allergies.     . metoprolol (LOPRESSOR) 100 MG tablet TAKE 1 TABLET BY MOUTH TWICE A DAY 60 tablet 0  . Multiple Vitamins-Minerals (ONE-A-DAY WOMENS 50 PLUS PO) Take 1 tablet by mouth daily.    . mycophenolate (CELLCEPT) 250 MG capsule Take 500 mg by mouth 2 (two) times daily.     . ONE TOUCH ULTRA TEST test strip TEST BLOOD SUGAR 3 TIMES A DAY (E11.9)  3  . pravastatin (PRAVACHOL) 20 MG tablet Take 1 tablet (20 mg total) by mouth every evening. 90 tablet 3  . predniSONE (DELTASONE) 5 MG tablet Take 5 mg by mouth daily.      . sitaGLIPtin (JANUVIA) 100 MG tablet Take 100 mg by mouth daily.      . traMADol (ULTRAM) 50 MG tablet Take 1 tablet (50 mg total) by mouth every 8 (eight) hours as needed for moderate pain. 60 tablet 0  . valACYclovir (VALTREX) 500 MG tablet Take 500 mg by mouth every Monday, Wednesday, and Friday. Only on Monday, Wednesday and Friday    . warfarin (COUMADIN) 2.5 MG tablet 1 Tab daily M,W,Th,F,Sun; 1/2 tab daily Tues/Sat. 30 tablet 3   No current facility-administered medications for this visit.    Allergies:   Bactrim; Aspirin; Shellfish allergy; Codeine; Latex;  Penicillins; and Tape    Social History:  The patient  reports that she has quit smoking. Her smoking use included Cigarettes. She has a 2.5 pack-year smoking history. She has never used smokeless tobacco. She reports that she does not drink alcohol or use illicit drugs.   Family History:  The patient's family history includes Kidney disease in her brother and sister. There is no history of Heart attack or Stroke. She was adopted.    ROS:  Please see the history of present illness.   Otherwise, review of systems are positive for none.   All other systems are reviewed and negative.    PHYSICAL EXAM: VS:  Ht 5' (1.524 m)  Wt 92 lb 12.8 oz (42.094 kg)  BMI 18.12 kg/m2 , BMI Body mass index is 18.12 kg/(m^2). GEN: Well nourished, well developed, in no acute distress HEENT: normal Neck: no JVD, carotid bruits, or masses Cardiac: RRR; no  rubs, or gallops,no edema . There is a 2/6 systolic murmur at the left sternal border Respiratory:  clear to auscultation bilaterally, normal work of breathing GI: soft, nontender, nondistended, + BS MS: no deformity or atrophy Skin: warm and dry, no rash Neuro:  Strength and sensation are intact Psych: euthymic mood, full affect Vascular: Femoral pulses are normal. Distal pulses are not palpable. Small ulceration involving the left to small dose. This seems to be slightly improved. The ulceration on the right big toe is significantly better without intervention.  EKG:  EKG is not ordered today.    Recent Labs: 11/21/2015: Hemoglobin 11.5*; Platelets 150 11/22/2015: BUN 20; Creatinine, Ser 1.15*; Potassium 4.2; Sodium 139    Lipid Panel    Component Value Date/Time   CHOL * 08/19/2009 0547    221        ATP III CLASSIFICATION:  <200     mg/dL   Desirable  454-098  mg/dL   Borderline High  >=119    mg/dL   High          TRIG 87 08/19/2009  0547   HDL 113 RESULTS CONFIRMED BY MANUAL DILUTION 08/19/2009 0547   CHOLHDL 2.0 08/19/2009 0547   VLDL  17 08/19/2009 0547   LDLCALC  08/19/2009 0547    91        Total Cholesterol/HDL:CHD Risk Coronary Heart Disease Risk Table                     Men   Women  1/2 Average Risk   3.4   3.3  Average Risk       5.0   4.4  2 X Average Risk   9.6   7.1  3 X Average Risk  23.4   11.0        Use the calculated Patient Ratio above and the CHD Risk Table to determine the patient's CHD Risk.        ATP III CLASSIFICATION (LDL):  <100     mg/dL   Optimal  161-096  mg/dL   Near or Above                    Optimal  130-159  mg/dL   Borderline  045-409  mg/dL   High  >811     mg/dL   Very High      Wt Readings from Last 3 Encounters:  11/26/15 92 lb 12.8 oz (42.094 kg)  11/23/15 100 lb 12 oz (45.7 kg)  11/12/15 91 lb (41.277 kg)         ASSESSMENT AND PLAN:  1.  Peripheral arterial disease with critical limb ischemia involving the left lower extremity.  Status post angioplasty on the left TP trunk. Unfortunately, I could not revascularize the anterior tibial artery which is occluded at the ostium. This patient is at significant risk for nonhealing of the ulcers and possible need for amputation given her poor nutritional status and peripheral arterial disease. From an endovascular standpoint, the only other option would be retrograde pedal access. She is not a candidate for surgical revascularization considering her extensive cardiac history and overall frail status. Continue Plavix for at least one month.  2. Chronic atrial fibrillation: Ventricular rate is well controlled.  3. Severe mitral stenosis: She appears to be asymptomatic.  4. Essential hypertension: We could not obtain a blood pressure but there was a strong palpable pulse in the vessels in her arms were overall noncompressible.  This patient is at significant risk for deterioration.  Disposition:   FU with me in 1 month  Signed,  Lorine Bears, MD  11/26/2015 6:01 PM    [facility]

## 2015-12-02 ENCOUNTER — Telehealth: Payer: Self-pay | Admitting: Cardiovascular Disease

## 2015-12-02 NOTE — Telephone Encounter (Signed)
Home health Physical Therapist calling  Stating he needs verbal orders to see pt at least 2x a week until 01/21/16 Please advise.

## 2015-12-02 NOTE — Telephone Encounter (Signed)
Attempted to contact [name] [name]. Line busy x 3 attempts.

## 2015-12-05 ENCOUNTER — Telehealth: Payer: Self-pay | Admitting: Cardiovascular Disease

## 2015-12-05 NOTE — Telephone Encounter (Signed)
Home health Physical Therapist Loraine Leriche called requesting verbalorders to see pt at least 2x a week until 01/21/16. He requests for [place] to leave a message on cell phone if he is unavailable.  Attempted to contact Mark. Line busy x 3 attempts.

## 2015-12-05 NOTE — Telephone Encounter (Signed)
Melissa, [facility] nurse, needs verbal to check coumadin tomorrow at pt's home. Verbal order given.

## 2015-12-05 NOTE — Telephone Encounter (Signed)
Need verbal order to do fingerstick for pt inr .  Please call to give verbal order .

## 2015-12-05 NOTE — Telephone Encounter (Signed)
Documentation by Ward Givens, NP : "We have consulted physical therapy who recommended home health PT. " Left message for [facility]

## 2015-12-06 ENCOUNTER — Telehealth: Payer: Self-pay | Admitting: Cardiovascular Disease

## 2015-12-06 ENCOUNTER — Ambulatory Visit (INDEPENDENT_AMBULATORY_CARE_PROVIDER_SITE_OTHER): Payer: Medicare Other

## 2015-12-06 ENCOUNTER — Other Ambulatory Visit: Payer: Self-pay | Admitting: Cardiovascular Disease

## 2015-12-06 DIAGNOSIS — Z5181 Encounter for therapeutic drug level monitoring: Secondary | ICD-10-CM

## 2015-12-06 DIAGNOSIS — I4821 Permanent atrial fibrillation: Secondary | ICD-10-CM

## 2015-12-06 DIAGNOSIS — I482 Chronic atrial fibrillation: Secondary | ICD-10-CM

## 2015-12-06 LAB — POCT INR: INR: 1.8

## 2015-12-06 NOTE — Telephone Encounter (Signed)
Result noted, see anticoagulation note in Epic. 

## 2015-12-06 NOTE — Telephone Encounter (Signed)
Home Health Nurse calling to give INR readings: INR 1.8 PT  21.1 Please call her number back if we can't reach her please call their office Office number [phone]

## 2015-12-10 ENCOUNTER — Other Ambulatory Visit (HOSPITAL_COMMUNITY): Payer: Medicare Other

## 2015-12-13 ENCOUNTER — Telehealth: Payer: Self-pay | Admitting: Cardiovascular Disease

## 2015-12-13 NOTE — Telephone Encounter (Signed)
Noted  

## 2015-12-13 NOTE — Telephone Encounter (Signed)
Calling to let us know pt has cancelled her OT, stating she has diarrhea and not feeling well. Just wanted to let us know. They plan to resume on Monday, if pt is feeling better

## 2015-12-17 ENCOUNTER — Ambulatory Visit (INDEPENDENT_AMBULATORY_CARE_PROVIDER_SITE_OTHER): Payer: Medicare Other | Admitting: Pharmacist

## 2015-12-17 ENCOUNTER — Telehealth: Payer: Self-pay | Admitting: Cardiovascular Disease

## 2015-12-17 DIAGNOSIS — I482 Chronic atrial fibrillation: Secondary | ICD-10-CM

## 2015-12-17 DIAGNOSIS — I4821 Permanent atrial fibrillation: Secondary | ICD-10-CM

## 2015-12-17 DIAGNOSIS — Z5181 Encounter for therapeutic drug level monitoring: Secondary | ICD-10-CM

## 2015-12-17 LAB — POCT INR: INR: 5.7

## 2015-12-17 NOTE — Telephone Encounter (Signed)
See anti-coag note.  Darl Pikes with [facility] agency checked INR today.  INR was elevated so patient will have a repeat INR next week at Dr. Jari Sportsman appointment.  Plan to call [facility] after the appointment for next INR check date.

## 2015-12-17 NOTE — Telephone Encounter (Signed)
HHRN saw pt today at home, and pt inquired about INR check. Pt did not report any abnormal bruising/bleeding, missed doses, med changes, or dietary changes. Noted 1.8 PT INR at last check 2 weeks ago.  She sees [name] in office in 1 week. Advised to follow up then & if PT INR warranted at that time, we can check in house - have pt call for any S&S bleeding, etc. Routed to [name] for any further recommendation.

## 2015-12-17 NOTE — Telephone Encounter (Signed)
New message    [name] [name] is calling to get an order to check pt INR

## 2015-12-18 ENCOUNTER — Other Ambulatory Visit: Payer: Self-pay | Admitting: *Deleted

## 2015-12-24 ENCOUNTER — Ambulatory Visit (INDEPENDENT_AMBULATORY_CARE_PROVIDER_SITE_OTHER): Payer: Medicare Other | Admitting: Cardiovascular Disease

## 2015-12-24 ENCOUNTER — Encounter: Payer: Self-pay | Admitting: Cardiovascular Disease

## 2015-12-24 ENCOUNTER — Ambulatory Visit (INDEPENDENT_AMBULATORY_CARE_PROVIDER_SITE_OTHER): Payer: Medicare Other | Admitting: Pharmacist Clinician (PhC)/ Clinical Pharmacy Specialist

## 2015-12-24 VITALS — BP 141/92 | HR 59 | Ht 60.0 in | Wt 92.4 lb

## 2015-12-24 DIAGNOSIS — Z5181 Encounter for therapeutic drug level monitoring: Secondary | ICD-10-CM | POA: Diagnosis not present

## 2015-12-24 DIAGNOSIS — I4891 Unspecified atrial fibrillation: Secondary | ICD-10-CM

## 2015-12-24 DIAGNOSIS — I739 Peripheral vascular disease, unspecified: Secondary | ICD-10-CM | POA: Diagnosis not present

## 2015-12-24 LAB — POCT INR: INR: 2

## 2015-12-24 MED ORDER — TRAMADOL HCL 50 MG PO TABS: 50.0000 mg | ORAL_TABLET | Freq: Three times a day (TID) | ORAL | Status: DC | PRN

## 2015-12-24 MED ORDER — CEPHALEXIN 500 MG PO CAPS: 500.0000 mg | ORAL_CAPSULE | Freq: Three times a day (TID) | ORAL | Status: DC

## 2015-12-24 NOTE — Progress Notes (Signed)
Cardiology Office Note   Date:  12/24/2015   ID:  SOLSTICE LASTINGER, DOB 06-11-1946, MRN [id number]  PCP:  [name], NP  Cardiologist:  Dr. Excell Seltzer.   Chief Complaint  Patient presents with  . Follow-up    1 month  pt c/o SOB and back pain, dizziness when she gets out of bedd or occasionally when standing up to quickly, swelling in left leg--has gone down some      History of Present Illness: [name] [name] is a 70 y.o. female who Is here today for a follow-up visit regarding  critical limb ischemia.  She has known history of permanent atrial fibrillation, renal failure status post kidney transplant, mitral stenosis, and lower extremity peripheral arterial disease. She was seen by me in 2014 for severe left calf claudication associated with ulceration involving the left foot.  She underwent angiography in 11/2012 which confirmed severe calcified disease in the left popliteal artery as well as the SFA. She underwent a complex procedure with atherectomy of the left popliteal artery and the left SFA. There was also one-vessel runoff below the knee with only patent anterior tibial artery which had diffuse 60% disease. She was seen recently for recurrent ulceration on left 2 small toes.  She also had an ingrown toenail on the right big toe with some ulceration in that area.  Noninvasive vascular evaluation showed that the ABIs could not be calculated due to noncompressible vessels.   I proceeded with angiography which showed patent left SFA and popliteal arteries with only moderate stenosis in the popliteal artery. Below the knee, the anterior tibial artery was occluded at the ostium. The peroneal artery was only patent vessel to the foot . There was 80% stenosis in the TP trunk at the origin of the anterior tibial artery. I performed successful balloon angioplasty on the TP trunk but was not able to cross the short occlusion in the anterior tibial artery. On the right side,  there was significant heavily calcified right SFA stenosis. The patient overall is very frail. She continues to complain of significant left foot pain. The ulceration has not healed and she has some bruising coming off from the tip of the toes. The left foot is very cold.    Past Medical History  Diagnosis Date  . Hypertension   . Stroke [facility]) December 2011    left MCA distribution  . Permanent atrial fibrillation (HCC)     coumadin clinic at [facility]. Has previously required Lovenox->Coumadin bridge.  . Severe mitral stenosis by prior echocardiogram     last echo 07/2012  . Aortic stenosis   . Symptomatic bradycardia     s/p pacer  . Depression   . HSV (herpes simplex virus) infection   . Anemia   . Hyperparathyroidism, primary (HCC)     s/p parathyroidectomy  . PAD (peripheral artery disease) (HCC)     a. s/p RLE angioplasty with [name]. b. (by [name])  L popliteral artery orbital atherectomy and balloon angioplasty & L SFA orbital atherectomy and balloon angioplasty 11/30/12.  [name] PVD (peripheral vascular disease) (HCC)   . Presence of permanent cardiac pacemaker   . High cholesterol   . CHF (congestive heart failure) (HCC)   . TIA (transient ischemic attack) 08/2009    [name] 09/05/2009  . Atrial thrombus (HCC)     appendage/notes 09/05/2009  . Type II diabetes mellitus (HCC)   . History of blood transfusion     related  to "kidney transplant"  . Chronic kidney disease     s/p renal transplant;  follows with [name]  . GERD (gastroesophageal reflux disease)     hx  . History of stomach ulcers   . Diabetic foot ulcer (HCC)     "left is worse than right" (11/20/2015)    Past Surgical History  Procedure Laterality Date  . Insert / replace / remove pacemaker    . Kidney transplant  December 1999  . Abdominal angiogram  11/30/2012    ABDOMINAL AORTIC ANGIOGRAM   DR ARDIA  . Dialysis fistula creation    . Abdominal aortagram N/A 11/30/2012     Procedure: ABDOMINAL [name];  Surgeon: [name], MD;  Location: [facility] CATH LAB;  Service: Cardiovascular;  Laterality: N/A;  . Peripheral vascular catheterization N/A 11/20/2015    Procedure: Abdominal Aortogram;  Surgeon: [name], MD;  Location: [facility] INVASIVE CV LAB;  Service: Cardiovascular;  Laterality: N/A;  . Peripheral vascular catheterization Bilateral 11/20/2015    Procedure: Lower Extremity Angiography;  Surgeon: [name], MD;  Location: [facility] INVASIVE CV LAB;  Service: Cardiovascular;  Laterality: Bilateral;  . Peripheral vascular catheterization  11/20/2015    Procedure: Peripheral Vascular Balloon Angioplasty;  Surgeon: [name], MD;  Location: [facility] INVASIVE CV LAB;  Service: Cardiovascular;;  . Tonsillectomy    . Appendectomy    . Hernia repair    . Umbilical hernia repair       Current Outpatient Prescriptions  Medication Sig Dispense Refill  . acetaminophen (TYLENOL) 500 MG tablet Take 500 mg by mouth every 6 (six) hours as needed. For pain    . albuterol (PROVENTIL) (2.5 MG/3ML) 0.083% nebulizer solution Take 2.5 mg by nebulization every 6 (six) hours as needed for wheezing or shortness of breath.    [name] amLODipine (NORVASC) 5 MG tablet Take 1 tablet (5 mg total) by mouth daily. 90 tablet 0  . azelastine (OPTIVAR) 0.05 % ophthalmic solution Place 1 drop into both eyes 2 (two) times daily as needed (for dry eyes).     [name] b complex-vitamin c-folic acid (NEPHRO-VITE) 0.8 MG TABS Take 1 tablet by mouth daily.     . Calcium Carbonate-Vitamin D (CALTRATE 600+D) 600-400 MG-UNIT per tablet Take 1 tablet by mouth 2 (two) times daily.     . clindamycin (CLEOCIN) 150 MG capsule Take 3 capsules (450 mg total) by mouth every 6 (six) hours. 90 capsule 0  . clopidogrel (PLAVIX) 75 MG tablet Take 1 tablet (75 mg total) by mouth daily. 30 tablet 11  . cycloSPORINE modified (NEORAL/GENGRAF) 25 MG capsule Take 25 mg by mouth 2 (two) times daily.      . digoxin (LANOXIN)  0.125 MG tablet Take 0.125 mg by mouth daily.     . ergocalciferol (VITAMIN D2) 50000 UNITS capsule Take 50,000 Units by mouth every 30 (thirty) days. First of the month    . fluticasone (FLONASE) 50 MCG/ACT nasal spray Place 2 sprays into the nose daily.    . furosemide (LASIX) 20 MG tablet TAKE 1 TABLET BY MOUTH EVERY OTHER DAY 30 tablet 0  . HYDROcodone-acetaminophen (NORCO/VICODIN) 5-325 MG tablet Take 1 tablet by mouth every 4 (four) hours as needed for moderate pain or severe pain. 20 tablet 0  . hydroxypropyl methylcellulose (ISOPTO TEARS) 2.5 % ophthalmic solution Place 1 drop into both eyes 4 (four) times daily as needed (dry eyes).    . iron polysaccharides (NIFEREX) 150 MG capsule Take 150  mg by mouth daily.     [name] JANUVIA 50 MG tablet Take 1 tablet by mouth daily.    [name] LEVEMIR FLEXTOUCH 100 UNIT/ML Pen Inject 100 Units as directed at bedtime.    . lidocaine (XYLOCAINE) 5 % ointment Apply 1 application topically as needed. For foot pain 35.44 g 0  . loratadine (CLARITIN) 10 MG tablet Take 10 mg by mouth daily as needed for allergies.     . metoprolol (LOPRESSOR) 100 MG tablet TAKE 1 TABLET BY MOUTH TWICE A DAY 60 tablet 6  . Multiple Vitamins-Minerals (ONE-A-DAY WOMENS 50 PLUS PO) Take 1 tablet by mouth daily.    . mycophenolate (CELLCEPT) 250 MG capsule Take 500 mg by mouth 2 (two) times daily.     . ONE TOUCH ULTRA TEST test strip TEST BLOOD SUGAR 3 TIMES A DAY (E11.9)  3  . pravastatin (PRAVACHOL) 20 MG tablet Take 1 tablet (20 mg total) by mouth every evening. 90 tablet 3  . predniSONE (DELTASONE) 5 MG tablet Take 5 mg by mouth daily.      . sitaGLIPtin (JANUVIA) 100 MG tablet Take 100 mg by mouth daily.      . traMADol (ULTRAM) 50 MG tablet Take 1 tablet (50 mg total) by mouth every 8 (eight) hours as needed for moderate pain. 60 tablet 0  . valACYclovir (VALTREX) 500 MG tablet Take 500 mg by mouth every Monday, Wednesday, and Friday. Only on Monday, Wednesday and Friday    .  warfarin (COUMADIN) 2.5 MG tablet 1 Tab daily M,W,Th,F,Sun; 1/2 tab daily Tues/Sat. 30 tablet 3   No current facility-administered medications for this visit.    Allergies:   Bactrim; Aspirin; Shellfish allergy; Codeine; Latex; Penicillins; and Tape    Social History:  The patient  reports that she has quit smoking. Her smoking use included Cigarettes. She has a 2.5 pack-year smoking history. She has never used smokeless tobacco. She reports that she does not drink alcohol or use illicit drugs.   Family History:  The patient's family history includes Kidney disease in her brother and sister. There is no history of Heart attack or Stroke. She was adopted.    ROS:  Please see the history of present illness.   Otherwise, review of systems are positive for none.   All other systems are reviewed and negative.    PHYSICAL EXAM: VS:  BP 141/92 mmHg  Pulse 59  Ht 5' (1.524 m)  Wt 92 lb 6.4 oz (41.912 kg)  BMI 18.05 kg/m2 , BMI Body mass index is 18.05 kg/(m^2). GEN: Well nourished, well developed, in no acute distress HEENT: normal Neck: no JVD, carotid bruits, or masses Cardiac: RRR; no  rubs, or gallops,no edema . There is a 2/6 systolic murmur at the left sternal border Respiratory:  clear to auscultation bilaterally, normal work of breathing GI: soft, nontender, nondistended, + BS MS: no deformity or atrophy Skin: warm and dry, no rash Neuro:  Strength and sensation are intact Psych: euthymic mood, full affect Vascular: Femoral pulses are normal. Distal pulses are not palpable. Small ulceration involving the left 2 small toes. The toenails seem to be coming off with some oozing. On the right side, there is some oozing from the tip of the toes   EKG:  EKG is not ordered today.    Recent Labs: 11/21/2015: Hemoglobin 11.5*; Platelets 150 11/22/2015: BUN 20; Creatinine, Ser 1.15*; Potassium 4.2; Sodium 139    Lipid Panel    Component Value Date/Time  CHOL * 08/19/2009 0547     221        ATP III CLASSIFICATION:  <200     mg/dL   Desirable  409-811  mg/dL   Borderline High  >=914    mg/dL   High          TRIG 87 08/19/2009 0547   HDL 113 RESULTS CONFIRMED BY MANUAL DILUTION 08/19/2009 0547   CHOLHDL 2.0 08/19/2009 0547   VLDL 17 08/19/2009 0547   LDLCALC  08/19/2009 0547    91        Total Cholesterol/HDL:CHD Risk Coronary Heart Disease Risk Table                     Men   Women  1/2 Average Risk   3.4   3.3  Average Risk       5.0   4.4  2 X Average Risk   9.6   7.1  3 X Average Risk  23.4   11.0        Use the calculated Patient Ratio above and the CHD Risk Table to determine the patient's CHD Risk.        ATP III CLASSIFICATION (LDL):  <100     mg/dL   Optimal  782-956  mg/dL   Near or Above                    Optimal  130-159  mg/dL   Borderline  213-086  mg/dL   High  >578     mg/dL   Very High      Wt Readings from Last 3 Encounters:  12/24/15 92 lb 6.4 oz (41.912 kg)  11/26/15 92 lb 12.8 oz (42.094 kg)  11/23/15 100 lb 12 oz (45.7 kg)         ASSESSMENT AND PLAN:  1.  Peripheral arterial disease with critical limb ischemia involving the left lower extremity.  Status post angioplasty on the left TP trunk. Unfortunately,  the anterior tibial artery could not be crossed from an antegrade fashion. This patient is at significant risk for nonhealing of the ulcers and possible need for below the knee amputation given her poor nutritional status and peripheral arterial disease. This again was discussed with her today and she is very emotional about this. From an endovascular standpoint, the only other option would be retrograde pedal access. She is not a candidate for surgical revascularization considering her extensive cardiac history and overall frail status.  I am going to discuss the case with Dr. Hoy Finlay. I prescribed Keflex today and also tramadol for pain control.  On the right side, if the ulcers on the toes do not heal  completely, she will require intervention on the right distal SFA  2. Chronic atrial fibrillation: Ventricular rate is well controlled.  3. Severe mitral stenosis: She appears to be asymptomatic likely due to poor functional status.  4. Essential hypertension:  Blood pressure is reasonably controlled.  This patient is at significant risk for deterioration.  Disposition:   FU with me in 1 month  Signed,  Lorine Bears, MD  12/24/2015 11:10 AM    [facility]

## 2015-12-24 NOTE — Patient Instructions (Addendum)
Medication Instructions:  Your physician has recommended you make the following change in your medication:  1. START Keflex 500mg  take one tablet by mouth three times per day for 1 WEEK 2. Refill provided on Tramadol  Labwork: No new orders.   Testing/Procedures: No new orders.   Follow-Up: Your physician recommends that you schedule a follow-up appointment in: 2 MONTHS with Dr Kirke Corin   Any Other Special Instructions Will Be Listed Below (If Applicable).     If you need a refill on your cardiac medications before your next appointment, please call your pharmacy.

## 2015-12-31 ENCOUNTER — Telehealth: Payer: Self-pay | Admitting: *Deleted

## 2015-12-31 ENCOUNTER — Ambulatory Visit (INDEPENDENT_AMBULATORY_CARE_PROVIDER_SITE_OTHER): Payer: Medicare Other | Admitting: Sports Medicine

## 2015-12-31 ENCOUNTER — Encounter: Payer: Self-pay | Admitting: Sports Medicine

## 2015-12-31 DIAGNOSIS — L97521 Non-pressure chronic ulcer of other part of left foot limited to breakdown of skin: Secondary | ICD-10-CM | POA: Diagnosis not present

## 2015-12-31 DIAGNOSIS — M79671 Pain in right foot: Secondary | ICD-10-CM | POA: Diagnosis not present

## 2015-12-31 DIAGNOSIS — I771 Stricture of artery: Secondary | ICD-10-CM | POA: Diagnosis not present

## 2015-12-31 DIAGNOSIS — L98499 Non-pressure chronic ulcer of skin of other sites with unspecified severity: Secondary | ICD-10-CM | POA: Diagnosis not present

## 2015-12-31 DIAGNOSIS — M79672 Pain in left foot: Secondary | ICD-10-CM | POA: Diagnosis not present

## 2015-12-31 DIAGNOSIS — I739 Peripheral vascular disease, unspecified: Secondary | ICD-10-CM | POA: Diagnosis not present

## 2015-12-31 DIAGNOSIS — L97511 Non-pressure chronic ulcer of other part of right foot limited to breakdown of skin: Secondary | ICD-10-CM | POA: Diagnosis not present

## 2015-12-31 MED ORDER — TRAMADOL HCL 50 MG PO TABS: 50.0000 mg | ORAL_TABLET | Freq: Three times a day (TID) | ORAL | Status: DC | PRN

## 2015-12-31 NOTE — Progress Notes (Signed)
Patient ID: [name] [name], female   DOB: 03/08/1946, 70 y.o.   MRN: [id number]   Subjective: [name] [name] is a 70 y.o. female patient seen in office for evaluation of digital ulcerations with extreme ischemic pain. Patient is s/p angioplasty Left Lower extremity. On Keflex with no problems and has been taking Tramadol as needed for pain. Patient is crying constantly during visit and is moaning in pain; states that she does not want to lose her legs or have an amputation; states that her vascular doctor told her this would be likely in the future due to extensive stenosis. Denies nausea/fever/vomiting/chills/night sweats/shortness of breath/Calf pain. +++ Extreme toe pain L>R.  Patient has no other pedal complaints at this time.  Patient Active Problem List   Diagnosis Date Noted  . Type II diabetes mellitus ([place]) 11/23/2015  . Hypoglycemia 11/23/2015  . Critical lower limb ischemia 11/20/2015  . Encounter for therapeutic drug monitoring 10/06/2013  . Colon cancer screening 06/01/2012  . 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 PRIMARY HYPERPARATHYROIDISM 08/06/2010  . ANEMIA 08/06/2010  . DEPRESSION 08/06/2010  . Mitral stenosis 08/06/2010  . Atrial fibrillation ([place]) 08/06/2010  . 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 DIASTOLIC DYSFUNCTION 53/66/4403  . Cerebral artery occlusion with cerebral infarction ([place]) 08/06/2010  . PVD 08/06/2010  . RENAL FAILURE, END STAGE 08/06/2010  . PACEMAKER, PERMANENT 08/06/2010   Current Outpatient Prescriptions on File Prior to Visit  Medication Sig Dispense Refill  . acetaminophen (TYLENOL) 500 MG tablet Take 500 mg by mouth every 6 (six) hours as needed. For pain    . albuterol (PROVENTIL) (2.5 MG/3ML) 0.083% nebulizer solution Take 2.5 mg by nebulization every 6 (six) hours as needed for wheezing or shortness of breath.    [name] amLODipine (NORVASC) 5 MG tablet Take 1 tablet (5 mg total) by mouth daily. 90 tablet 0  . azelastine (OPTIVAR) 0.05 % ophthalmic  solution Place 1 drop into both eyes 2 (two) times daily as needed (for dry eyes).     [name] b complex-vitamin c-folic acid (NEPHRO-VITE) 0.8 MG TABS Take 1 tablet by mouth daily.     . Calcium Carbonate-Vitamin D (CALTRATE 600+D) 600-400 MG-UNIT per tablet Take 1 tablet by mouth 2 (two) times daily.     . cephALEXin (KEFLEX) 500 MG capsule Take 1 capsule (500 mg total) by mouth 3 (three) times daily. 21 capsule 0  . clindamycin (CLEOCIN) 150 MG capsule Take 3 capsules (450 mg total) by mouth every 6 (six) hours. 90 capsule 0  . clopidogrel (PLAVIX) 75 MG tablet Take 1 tablet (75 mg total) by mouth daily. 30 tablet 11  . cycloSPORINE modified (NEORAL/GENGRAF) 25 MG capsule Take 25 mg by mouth 2 (two) times daily.      . digoxin (LANOXIN) 0.125 MG tablet Take 0.125 mg by mouth daily.     . ergocalciferol (VITAMIN D2) 50000 UNITS capsule Take 50,000 Units by mouth every 30 (thirty) days. First of the month    . fluticasone (FLONASE) 50 MCG/ACT nasal spray Place 2 sprays into the nose daily.    . furosemide (LASIX) 20 MG tablet TAKE 1 TABLET BY MOUTH EVERY OTHER DAY 30 tablet 0  . hydroxypropyl methylcellulose (ISOPTO TEARS) 2.5 % ophthalmic solution Place 1 drop into both eyes 4 (four) times daily as needed (dry eyes).    . iron polysaccharides (NIFEREX) 150 MG capsule Take 150 mg by mouth daily.     [name] JANUVIA 50 MG tablet Take 1 tablet by mouth daily.    [name]  LEVEMIR FLEXTOUCH 100 UNIT/ML Pen Inject 100 Units as directed at bedtime.    . lidocaine (XYLOCAINE) 5 % ointment Apply 1 application topically as needed. For foot pain 35.44 g 0  . loratadine (CLARITIN) 10 MG tablet Take 10 mg by mouth daily as needed for allergies.     . metoprolol (LOPRESSOR) 100 MG tablet TAKE 1 TABLET BY MOUTH TWICE A DAY 60 tablet 6  . Multiple Vitamins-Minerals (ONE-A-DAY WOMENS 50 PLUS PO) Take 1 tablet by mouth daily.    . mycophenolate (CELLCEPT) 250 MG capsule Take 500 mg by mouth 2 (two) times daily.     . ONE TOUCH  ULTRA TEST test strip TEST BLOOD SUGAR 3 TIMES A DAY (E11.9)  3  . pravastatin (PRAVACHOL) 20 MG tablet Take 1 tablet (20 mg total) by mouth every evening. 90 tablet 3  . predniSONE (DELTASONE) 5 MG tablet Take 5 mg by mouth daily.      . sitaGLIPtin (JANUVIA) 100 MG tablet Take 100 mg by mouth daily.      . valACYclovir (VALTREX) 500 MG tablet Take 500 mg by mouth every Monday, Wednesday, and Friday. Only on Monday, Wednesday and Friday    . warfarin (COUMADIN) 2.5 MG tablet 1 Tab daily M,W,Th,F,Sun; 1/2 tab daily Tues/Sat. 30 tablet 3   No current facility-administered medications on file prior to visit.   Allergies  Allergen Reactions  . Bactrim [Sulfamethoxazole-Trimethoprim] Itching  . Aspirin Other (See Comments)    GI upset  . Shellfish Allergy Hives    Also nausea  . Codeine Hives and Nausea Only  . Latex Rash  . Penicillins Nausea And Vomiting and Rash    Has patient had a PCN reaction causing immediate rash, facial/tongue/throat swelling, SOB or lightheadedness with hypotension:No Has patient had a PCN reaction causing severe rash involving mucus membranes or skin necrosis:No Has patient had a PCN reaction that required hospitalization:No Has patient had a PCN reaction occurring within the last 10 years:cannot recall If all of the above answers are "NO", then may proceed with Cephalosporin use.   . Tape Itching and Rash    Please use "paper" tape    Recent Results (from the past 2160 hour(s))  Implantable device - remote     Status: None   Collection Time: 10/18/15  2:25 AM  Result Value Ref Range   Date Time Interrogation Session [id number]    Pulse Generator Manufacturer MERM    Pulse Gen Model ADSR01 Adapta    Pulse Gen Serial Number [id number]    Implantable Pulse Generator Type Implantable Pulse Generator    Implantable Pulse Generator Implant Date [id number]    Implantable Lead Manufacturer BOST    Implantable Lead Model 4470 Fineline II Sterox EZ     Implantable Lead Serial Number [id number]    Implantable Lead Implant Date [id number]    Implantable Lead Location [phone]    Lead Channel Setting Sensing Sensitivity 4.00 mV   Lead Channel Setting Pacing Pulse Width 0.40 ms   Lead Channel Setting Pacing Amplitude 2.500 V   Lead Channel Impedance Value 0 ohm   Lead Channel Impedance Value 534 ohm   Battery Status Unknown    Battery Remaining Longevity 61 mo   Battery Voltage 2.76 V   Battery Impedance 1157 ohm   Brady Statistic RV Percent Paced 24 %   Eval Rhythm VS   POCT INR     Status: None   Collection Time: 11/06/15  2:51 PM  Result Value Ref  Range   INR 1.9   Basic Metabolic Panel (BMET)     Status: Abnormal   Collection Time: 11/13/15  2:12 PM  Result Value Ref Range   Sodium 140 135 - 146 mmol/L   Potassium 4.1 3.5 - 5.3 mmol/L   Chloride 103 98 - 110 mmol/L   CO2 24 20 - 31 mmol/L   Glucose, Bld 100 (H) 65 - 99 mg/dL   BUN 23 7 - 25 mg/dL   Creat 1.17 (H) 0.50 - 0.99 mg/dL   Calcium 9.0 8.6 - 10.4 mg/dL  CBC     Status: None   Collection Time: 11/13/15  2:12 PM  Result Value Ref Range   WBC 5.8 4.0 - 10.5 K/uL   RBC 4.29 3.87 - 5.11 MIL/uL   Hemoglobin 13.9 12.0 - 15.0 g/dL   HCT 41.1 36.0 - 46.0 %   MCV 95.8 78.0 - 100.0 fL   MCH 32.4 26.0 - 34.0 pg   MCHC 33.8 30.0 - 36.0 g/dL   RDW 15.3 11.5 - 15.5 %   Platelets 208 150 - 400 K/uL   MPV 9.9 8.6 - 12.4 fL  POCT INR     Status: None   Collection Time: 11/13/15  2:18 PM  Result Value Ref Range   INR 2.6   POC CBG, ED     Status: Abnormal   Collection Time: 11/17/15 10:53 PM  Result Value Ref Range   Glucose-Capillary 233 (H) 65 - 99 mg/dL  Glucose, capillary     Status: Abnormal   Collection Time: 11/20/15  7:09 AM  Result Value Ref Range   Glucose-Capillary 162 (H) 65 - 99 mg/dL   Comment 1 Notify RN   Protime-INR     Status: Abnormal   Collection Time: 11/20/15  7:26 AM  Result Value Ref Range   Prothrombin Time 16.6 (H) 11.6 - 15.2 seconds   INR 1.33  0.00 - 1.49  POCT Activated clotting time     Status: None   Collection Time: 11/20/15  9:22 AM  Result Value Ref Range   Activated Clotting Time 291 seconds  POCT Activated clotting time     Status: None   Collection Time: 11/20/15 10:09 AM  Result Value Ref Range   Activated Clotting Time 296 seconds  Glucose, capillary     Status: Abnormal   Collection Time: 11/20/15 11:25 AM  Result Value Ref Range   Glucose-Capillary 195 (H) 65 - 99 mg/dL  POCT Activated clotting time     Status: None   Collection Time: 11/20/15 11:26 AM  Result Value Ref Range   Activated Clotting Time 229 seconds  POCT Activated clotting time     Status: None   Collection Time: 11/20/15 12:35 PM  Result Value Ref Range   Activated Clotting Time 198 seconds  POCT Activated clotting time     Status: None   Collection Time: 11/20/15  3:11 PM  Result Value Ref Range   Activated Clotting Time 162 seconds  Glucose, capillary     Status: Abnormal   Collection Time: 11/20/15  4:23 PM  Result Value Ref Range   Glucose-Capillary 201 (H) 65 - 99 mg/dL  Glucose, capillary     Status: Abnormal   Collection Time: 11/20/15  9:34 PM  Result Value Ref Range   Glucose-Capillary 256 (H) 65 - 99 mg/dL   Comment 1 Notify RN    Comment 2 Document in Chart   Basic metabolic panel  Status: Abnormal   Collection Time: 11/21/15  4:25 AM  Result Value Ref Range   Sodium 139 135 - 145 mmol/L   Potassium 3.9 3.5 - 5.1 mmol/L   Chloride 108 101 - 111 mmol/L   CO2 22 22 - 32 mmol/L   Glucose, Bld 122 (H) 65 - 99 mg/dL   BUN 16 6 - 20 mg/dL   Creatinine, Ser 1.05 (H) 0.44 - 1.00 mg/dL   Calcium 8.7 (L) 8.9 - 10.3 mg/dL   GFR calc non Af Amer 54 (L) >60 mL/min   GFR calc Af Amer >60 >60 mL/min    Comment: (NOTE) The eGFR has been calculated using the CKD EPI equation. This calculation has not been validated in all clinical situations. eGFR's persistently <60 mL/min signify possible Chronic Kidney Disease.    Anion gap  9 5 - 15  Protime-INR     Status: Abnormal   Collection Time: 11/21/15  4:25 AM  Result Value Ref Range   Prothrombin Time 17.1 (H) 11.6 - 15.2 seconds   INR 1.39 0.00 - 1.49  CBC     Status: Abnormal   Collection Time: 11/21/15  4:25 AM  Result Value Ref Range   WBC 6.3 4.0 - 10.5 K/uL   RBC 3.52 (L) 3.87 - 5.11 MIL/uL   Hemoglobin 11.5 (L) 12.0 - 15.0 g/dL   HCT 34.9 (L) 36.0 - 46.0 %   MCV 99.1 78.0 - 100.0 fL   MCH 32.7 26.0 - 34.0 pg   MCHC 33.0 30.0 - 36.0 g/dL   RDW 15.3 11.5 - 15.5 %   Platelets 150 150 - 400 K/uL  Glucose, capillary     Status: Abnormal   Collection Time: 11/21/15  6:45 AM  Result Value Ref Range   Glucose-Capillary 49 (L) 65 - 99 mg/dL   Comment 1 Notify RN    Comment 2 Repeat Test    Comment 3 Document in Chart   Glucose, capillary     Status: Abnormal   Collection Time: 11/21/15  7:12 AM  Result Value Ref Range   Glucose-Capillary 46 (L) 65 - 99 mg/dL   Comment 1 Notify RN    Comment 2 Repeat Test    Comment 3 Document in Chart   Glucose, capillary     Status: None   Collection Time: 11/21/15  7:38 AM  Result Value Ref Range   Glucose-Capillary 92 65 - 99 mg/dL  Glucose, capillary     Status: Abnormal   Collection Time: 11/21/15 12:46 PM  Result Value Ref Range   Glucose-Capillary 145 (H) 65 - 99 mg/dL  Glucose, capillary     Status: Abnormal   Collection Time: 11/21/15  5:25 PM  Result Value Ref Range   Glucose-Capillary 182 (H) 65 - 99 mg/dL  Glucose, capillary     Status: None   Collection Time: 11/21/15  9:19 PM  Result Value Ref Range   Glucose-Capillary 94 65 - 99 mg/dL  Glucose, capillary     Status: Abnormal   Collection Time: 11/22/15  1:25 AM  Result Value Ref Range   Glucose-Capillary 126 (H) 65 - 99 mg/dL   Comment 1 Notify RN    Comment 2 Document in Chart   Protime-INR     Status: Abnormal   Collection Time: 11/22/15  3:50 AM  Result Value Ref Range   Prothrombin Time 16.1 (H) 11.6 - 15.2 seconds   INR 1.28 0.00 -  8.11  Basic metabolic panel  Status: Abnormal   Collection Time: 11/22/15  3:50 AM  Result Value Ref Range   Sodium 139 135 - 145 mmol/L   Potassium 4.2 3.5 - 5.1 mmol/L   Chloride 107 101 - 111 mmol/L   CO2 22 22 - 32 mmol/L   Glucose, Bld 164 (H) 65 - 99 mg/dL   BUN 20 6 - 20 mg/dL   Creatinine, Ser 1.15 (H) 0.44 - 1.00 mg/dL   Calcium 9.0 8.9 - 10.3 mg/dL   GFR calc non Af Amer 48 (L) >60 mL/min   GFR calc Af Amer 56 (L) >60 mL/min    Comment: (NOTE) The eGFR has been calculated using the CKD EPI equation. This calculation has not been validated in all clinical situations. eGFR's persistently <60 mL/min signify possible Chronic Kidney Disease.    Anion gap 10 5 - 15  Glucose, capillary     Status: Abnormal   Collection Time: 11/22/15  5:10 AM  Result Value Ref Range   Glucose-Capillary 145 (H) 65 - 99 mg/dL   Comment 1 Notify RN    Comment 2 Document in Chart   Glucose, capillary     Status: Abnormal   Collection Time: 11/22/15  7:03 AM  Result Value Ref Range   Glucose-Capillary 147 (H) 65 - 99 mg/dL   Comment 1 Notify RN    Comment 2 Document in Chart   Glucose, capillary     Status: Abnormal   Collection Time: 11/22/15 12:53 PM  Result Value Ref Range   Glucose-Capillary 249 (H) 65 - 99 mg/dL  Glucose, capillary     Status: Abnormal   Collection Time: 11/22/15  5:44 PM  Result Value Ref Range   Glucose-Capillary 164 (H) 65 - 99 mg/dL   Comment 1 Notify RN   Glucose, capillary     Status: Abnormal   Collection Time: 11/22/15  9:02 PM  Result Value Ref Range   Glucose-Capillary 159 (H) 65 - 99 mg/dL  Protime-INR     Status: Abnormal   Collection Time: 11/23/15  2:48 AM  Result Value Ref Range   Prothrombin Time 21.5 (H) 11.6 - 15.2 seconds   INR 1.88 (H) 0.00 - 1.49  Glucose, capillary     Status: Abnormal   Collection Time: 11/23/15  6:10 AM  Result Value Ref Range   Glucose-Capillary 142 (H) 65 - 99 mg/dL  POCT INR     Status: None   Collection Time:  11/26/15 10:58 AM  Result Value Ref Range   INR 1.6   POCT INR     Status: None   Collection Time: 12/06/15 12:00 AM  Result Value Ref Range   INR 1.8     Comment: [facility]  POCT INR     Status: None   Collection Time: 12/17/15 12:00 AM  Result Value Ref Range   INR 5.7     Comment: [facility]  POCT INR     Status: None   Collection Time: 12/24/15 11:35 AM  Result Value Ref Range   INR 2     Objective: There were no vitals filed for this visit.  General: Patient is awake, alert, oriented x 3 and in moderate distress due to pain in left toes.  Dermatology: Skin is warm and dry bilateral with a partial thickness ulceration present at right hallux distal tuft and left 5th DIPJ, MTPJ, and left 2nd dorsal toe. Ulcerations measures <0.5cm There is a mix of eschar that is dry in nature and fibrotic  tissue at ulcer sites. The ulcerations do not probe to bone. There is no malodor, no active drainage, no cellulitis, no puss, no edema. No acute signs of infection. There is reflux erythema to left toes secondary to angiolplasty.    Vascular: Dorsalis Pedis pulse = 0/4 Bilateral,  Posterior Tibial pulse = 0/4 Bilateral,  Capillary Fill Time 5 seconds. No gangrene.   Neurologic: Patient is hypersensitive and is in extreme pain with touch to toes. + ischemia with pain.   Musculosketal: + Pain with palpation to ulcerated areas L>R. Mild hammertoes bilateral. No pain with compression to calves bilateral.   Assessment and Plan:  Problem List Items Addressed This Visit    None    Visit Diagnoses    Foot ulcer, left, limited to breakdown of skin ([employer])    -  Primary    Relevant Medications    traMADol (ULTRAM) 50 MG tablet    Foot ulcer, right, limited to breakdown of skin ([facility])        Relevant Medications    traMADol (ULTRAM) 50 MG tablet    Arterial insufficiency with ischemic ulcer ([facility])        bilateral feet at toes limited to break down of skin    PAD (peripheral artery disease) ([facility])         Relevant Medications    traMADol (ULTRAM) 50 MG tablet    Foot pain, bilateral        Severe secondary to PAD    Relevant Medications    traMADol (ULTRAM) 50 MG tablet      -Examined patient and discussed the progression of the wounds and treatment alternatives. -Applied betadine and stockinette to left foot, patient to due the same at home daily. No dressings needed at this time due to dry and stable nature of these wound sites.  -Advised patient the importance of keeping areas dry with betadine antiseptic and to monitor closely for gangrenous changes, if occurs to return to office or ER immediately -Cont with close vascular follow up and intervention for ischemic ulcerations and pain; May consider pain management consult at next visit. -Advised patient that because of extensive vascular disease may result to amputation especially if ulcerations continue to worsen; patient was emotional about this and continue to cry and moan in pain during this visit; I offered sympathy to patient and encouraged her that myself and the vascular doctors are trying to do the most that we can for her condition; patient expressed understanding. -Cont with post op shoes bilateral and keflex until completed; dispensed new post op shoes at this visit -Cont with Tramadol as needed for pain. Refill provided at today's encounter.  - Advised patient to go to the ER or return to office if the wound worsens or if constitutional symptoms are present. -Patient to return to office in 2 -3 weeks for follow up care and evaluation or sooner if problems arise.  Landis Martins, DPM

## 2015-12-31 NOTE — Telephone Encounter (Signed)
Pt states her feet are gushing so bad she can't get her shoes on, and she thinks she can't make her appt today at 315pm.  I called pt and she said she was all right now and turning the corner to come in.

## 2016-01-07 ENCOUNTER — Encounter (HOSPITAL_COMMUNITY): Payer: Self-pay | Admitting: Emergency Medicine

## 2016-01-07 ENCOUNTER — Inpatient Hospital Stay (HOSPITAL_COMMUNITY): Payer: Medicare Other

## 2016-01-07 ENCOUNTER — Inpatient Hospital Stay (HOSPITAL_COMMUNITY)
Admission: EM | Admit: 2016-01-07 | Discharge: 2016-01-16 | DRG: 964 | Disposition: A | Discharge status: Rehabilitation Facility | Payer: Medicare Other | Attending: General Surgery | Admitting: General Surgery

## 2016-01-07 ENCOUNTER — Emergency Department (HOSPITAL_COMMUNITY): Payer: Medicare Other

## 2016-01-07 DIAGNOSIS — E11621 Type 2 diabetes mellitus with foot ulcer: Secondary | ICD-10-CM | POA: Diagnosis present

## 2016-01-07 DIAGNOSIS — I739 Peripheral vascular disease, unspecified: Secondary | ICD-10-CM | POA: Insufficient documentation

## 2016-01-07 DIAGNOSIS — Z7901 Long term (current) use of anticoagulants: Secondary | ICD-10-CM | POA: Diagnosis not present

## 2016-01-07 DIAGNOSIS — Z881 Allergy status to other antibiotic agents status: Secondary | ICD-10-CM | POA: Diagnosis not present

## 2016-01-07 DIAGNOSIS — E1152 Type 2 diabetes mellitus with diabetic peripheral angiopathy with gangrene: Secondary | ICD-10-CM | POA: Diagnosis present

## 2016-01-07 DIAGNOSIS — R488 Other symbolic dysfunctions: Secondary | ICD-10-CM | POA: Diagnosis not present

## 2016-01-07 DIAGNOSIS — Z7984 Long term (current) use of oral hypoglycemic drugs: Secondary | ICD-10-CM | POA: Diagnosis not present

## 2016-01-07 DIAGNOSIS — I6201 Nontraumatic acute subdural hemorrhage: Secondary | ICD-10-CM | POA: Diagnosis not present

## 2016-01-07 DIAGNOSIS — L97519 Non-pressure chronic ulcer of other part of right foot with unspecified severity: Secondary | ICD-10-CM | POA: Diagnosis present

## 2016-01-07 DIAGNOSIS — Z91040 Latex allergy status: Secondary | ICD-10-CM

## 2016-01-07 DIAGNOSIS — S069X4S Unspecified intracranial injury with loss of consciousness of 6 hours to 24 hours, sequela: Secondary | ICD-10-CM | POA: Diagnosis not present

## 2016-01-07 DIAGNOSIS — L97529 Non-pressure chronic ulcer of other part of left foot with unspecified severity: Secondary | ICD-10-CM | POA: Diagnosis present

## 2016-01-07 DIAGNOSIS — I480 Paroxysmal atrial fibrillation: Secondary | ICD-10-CM | POA: Diagnosis present

## 2016-01-07 DIAGNOSIS — R402413 Glasgow coma scale score 13-15, at hospital admission: Secondary | ICD-10-CM | POA: Diagnosis present

## 2016-01-07 DIAGNOSIS — K219 Gastro-esophageal reflux disease without esophagitis: Secondary | ICD-10-CM | POA: Diagnosis present

## 2016-01-07 DIAGNOSIS — E210 Primary hyperparathyroidism: Secondary | ICD-10-CM | POA: Diagnosis present

## 2016-01-07 DIAGNOSIS — Z7902 Long term (current) use of antithrombotics/antiplatelets: Secondary | ICD-10-CM | POA: Diagnosis not present

## 2016-01-07 DIAGNOSIS — Z794 Long term (current) use of insulin: Secondary | ICD-10-CM

## 2016-01-07 DIAGNOSIS — S065XAA Traumatic subdural hemorrhage with loss of consciousness status unknown, initial encounter: Secondary | ICD-10-CM

## 2016-01-07 DIAGNOSIS — I609 Nontraumatic subarachnoid hemorrhage, unspecified: Secondary | ICD-10-CM | POA: Diagnosis present

## 2016-01-07 DIAGNOSIS — N179 Acute kidney failure, unspecified: Secondary | ICD-10-CM | POA: Diagnosis not present

## 2016-01-07 DIAGNOSIS — I130 Hypertensive heart and chronic kidney disease with heart failure and stage 1 through stage 4 chronic kidney disease, or unspecified chronic kidney disease: Secondary | ICD-10-CM | POA: Diagnosis present

## 2016-01-07 DIAGNOSIS — Z91013 Allergy to seafood: Secondary | ICD-10-CM | POA: Diagnosis not present

## 2016-01-07 DIAGNOSIS — Z7952 Long term (current) use of systemic steroids: Secondary | ICD-10-CM | POA: Diagnosis not present

## 2016-01-07 DIAGNOSIS — S32512A Fracture of superior rim of left pubis, initial encounter for closed fracture: Secondary | ICD-10-CM | POA: Diagnosis present

## 2016-01-07 DIAGNOSIS — R4701 Aphasia: Secondary | ICD-10-CM | POA: Diagnosis present

## 2016-01-07 DIAGNOSIS — S069X3S Unspecified intracranial injury with loss of consciousness of 1 hour to 5 hours 59 minutes, sequela: Secondary | ICD-10-CM | POA: Diagnosis not present

## 2016-01-07 DIAGNOSIS — E43 Unspecified severe protein-calorie malnutrition: Secondary | ICD-10-CM | POA: Insufficient documentation

## 2016-01-07 DIAGNOSIS — Z91048 Other nonmedicinal substance allergy status: Secondary | ICD-10-CM

## 2016-01-07 DIAGNOSIS — G4089 Other seizures: Secondary | ICD-10-CM | POA: Diagnosis present

## 2016-01-07 DIAGNOSIS — W06XXXA Fall from bed, initial encounter: Secondary | ICD-10-CM | POA: Diagnosis present

## 2016-01-07 DIAGNOSIS — Z940 Kidney transplant status: Secondary | ICD-10-CM

## 2016-01-07 DIAGNOSIS — Z885 Allergy status to narcotic agent status: Secondary | ICD-10-CM

## 2016-01-07 DIAGNOSIS — Y92003 Bedroom of unspecified non-institutional (private) residence as the place of occurrence of the external cause: Secondary | ICD-10-CM | POA: Diagnosis not present

## 2016-01-07 DIAGNOSIS — Z79899 Other long term (current) drug therapy: Secondary | ICD-10-CM

## 2016-01-07 DIAGNOSIS — Z87891 Personal history of nicotine dependence: Secondary | ICD-10-CM | POA: Diagnosis not present

## 2016-01-07 DIAGNOSIS — N181 Chronic kidney disease, stage 1: Secondary | ICD-10-CM | POA: Diagnosis present

## 2016-01-07 DIAGNOSIS — Z418 Encounter for other procedures for purposes other than remedying health state: Secondary | ICD-10-CM | POA: Diagnosis not present

## 2016-01-07 DIAGNOSIS — S32592A Other specified fracture of left pubis, initial encounter for closed fracture: Secondary | ICD-10-CM | POA: Diagnosis not present

## 2016-01-07 DIAGNOSIS — I482 Chronic atrial fibrillation, unspecified: Secondary | ICD-10-CM

## 2016-01-07 DIAGNOSIS — R7989 Other specified abnormal findings of blood chemistry: Secondary | ICD-10-CM | POA: Diagnosis not present

## 2016-01-07 DIAGNOSIS — Z950 Presence of cardiac pacemaker: Secondary | ICD-10-CM | POA: Diagnosis not present

## 2016-01-07 DIAGNOSIS — S065X0A Traumatic subdural hemorrhage without loss of consciousness, initial encounter: Principal | ICD-10-CM | POA: Diagnosis present

## 2016-01-07 DIAGNOSIS — I6200 Nontraumatic subdural hemorrhage, unspecified: Secondary | ICD-10-CM | POA: Diagnosis not present

## 2016-01-07 DIAGNOSIS — S069X0S Unspecified intracranial injury without loss of consciousness, sequela: Secondary | ICD-10-CM | POA: Diagnosis not present

## 2016-01-07 DIAGNOSIS — G35 Multiple sclerosis: Secondary | ICD-10-CM | POA: Diagnosis not present

## 2016-01-07 DIAGNOSIS — E1122 Type 2 diabetes mellitus with diabetic chronic kidney disease: Secondary | ICD-10-CM | POA: Diagnosis present

## 2016-01-07 DIAGNOSIS — E1165 Type 2 diabetes mellitus with hyperglycemia: Secondary | ICD-10-CM | POA: Diagnosis not present

## 2016-01-07 DIAGNOSIS — S069X9A Unspecified intracranial injury with loss of consciousness of unspecified duration, initial encounter: Secondary | ICD-10-CM | POA: Diagnosis not present

## 2016-01-07 DIAGNOSIS — S32409A Unspecified fracture of unspecified acetabulum, initial encounter for closed fracture: Secondary | ICD-10-CM

## 2016-01-07 DIAGNOSIS — I10 Essential (primary) hypertension: Secondary | ICD-10-CM | POA: Diagnosis not present

## 2016-01-07 DIAGNOSIS — W19XXXA Unspecified fall, initial encounter: Secondary | ICD-10-CM

## 2016-01-07 DIAGNOSIS — Z8711 Personal history of peptic ulcer disease: Secondary | ICD-10-CM | POA: Diagnosis not present

## 2016-01-07 DIAGNOSIS — S065X9A Traumatic subdural hemorrhage with loss of consciousness of unspecified duration, initial encounter: Secondary | ICD-10-CM | POA: Diagnosis present

## 2016-01-07 DIAGNOSIS — S066X0A Traumatic subarachnoid hemorrhage without loss of consciousness, initial encounter: Secondary | ICD-10-CM | POA: Diagnosis present

## 2016-01-07 DIAGNOSIS — S069X2S Unspecified intracranial injury with loss of consciousness of 31 minutes to 59 minutes, sequela: Secondary | ICD-10-CM | POA: Diagnosis not present

## 2016-01-07 DIAGNOSIS — I619 Nontraumatic intracerebral hemorrhage, unspecified: Secondary | ICD-10-CM

## 2016-01-07 DIAGNOSIS — R569 Unspecified convulsions: Secondary | ICD-10-CM

## 2016-01-07 DIAGNOSIS — W1830XA Fall on same level, unspecified, initial encounter: Secondary | ICD-10-CM | POA: Diagnosis not present

## 2016-01-07 DIAGNOSIS — I5030 Unspecified diastolic (congestive) heart failure: Secondary | ICD-10-CM | POA: Diagnosis present

## 2016-01-07 DIAGNOSIS — R29818 Other symptoms and signs involving the nervous system: Secondary | ICD-10-CM

## 2016-01-07 DIAGNOSIS — S32810A Multiple fractures of pelvis with stable disruption of pelvic ring, initial encounter for closed fracture: Secondary | ICD-10-CM | POA: Diagnosis not present

## 2016-01-07 LAB — COMPREHENSIVE METABOLIC PANEL
ALK PHOS: 99 U/L (ref 38–126)
ALK PHOS: 100 U/L (ref 38–126)
ALT: 43 U/L (ref 14–54)
ALT: 39 U/L (ref 14–54)
AST: 60 U/L — AB (ref 15–41)
AST: 53 U/L — AB (ref 15–41)
Albumin: 3.5 g/dL (ref 3.5–5.0)
Albumin: 3.1 g/dL — ABNORMAL LOW (ref 3.5–5.0)
Anion gap: 11 (ref 5–15)
Anion gap: 11 (ref 5–15)
BILIRUBIN TOTAL: 1 mg/dL (ref 0.3–1.2)
BILIRUBIN TOTAL: 1 mg/dL (ref 0.3–1.2)
BUN: 17 mg/dL (ref 6–20)
BUN: 14 mg/dL (ref 6–20)
CALCIUM: 9.6 mg/dL (ref 8.9–10.3)
CHLORIDE: 99 mmol/L — AB (ref 101–111)
CO2: 26 mmol/L (ref 22–32)
CO2: 25 mmol/L (ref 22–32)
CREATININE: 0.98 mg/dL (ref 0.44–1.00)
CREATININE: 0.94 mg/dL (ref 0.44–1.00)
Calcium: 9.2 mg/dL (ref 8.9–10.3)
Chloride: 101 mmol/L (ref 101–111)
GFR calc Af Amer: >60 mL/min (ref >60)
GFR calc Af Amer: >60 mL/min (ref >60)
GFR, EST NON AFRICAN AMERICAN: 58 mL/min — AB (ref >60)
Glucose, Bld: 89 mg/dL (ref 65–99)
Glucose, Bld: 127 mg/dL — ABNORMAL HIGH (ref 65–99)
POTASSIUM: 4 mmol/L (ref 3.5–5.1)
Potassium: 3.7 mmol/L (ref 3.5–5.1)
Sodium: 138 mmol/L (ref 135–145)
Sodium: 135 mmol/L (ref 135–145)
TOTAL PROTEIN: 6.7 g/dL (ref 6.5–8.1)
TOTAL PROTEIN: 6.4 g/dL — AB (ref 6.5–8.1)

## 2016-01-07 LAB — CBC
HCT: 40.4 % (ref 36.0–46.0)
HEMATOCRIT: 41.7 % (ref 36.0–46.0)
Hemoglobin: 13.4 g/dL (ref 12.0–15.0)
Hemoglobin: 13.9 g/dL (ref 12.0–15.0)
MCH: 32.6 pg (ref 26.0–34.0)
MCH: 32.7 pg (ref 26.0–34.0)
MCHC: 33.2 g/dL (ref 30.0–36.0)
MCHC: 33.3 g/dL (ref 30.0–36.0)
MCV: 98.3 fL (ref 78.0–100.0)
MCV: 98.1 fL (ref 78.0–100.0)
PLATELETS: 274 10*3/uL (ref 150–400)
Platelets: 248 10*3/uL (ref 150–400)
RBC: 4.11 MIL/uL (ref 3.87–5.11)
RBC: 4.25 MIL/uL (ref 3.87–5.11)
RDW: 13.7 % (ref 11.5–15.5)
RDW: 13.7 % (ref 11.5–15.5)
WBC: 9.4 10*3/uL (ref 4.0–10.5)
WBC: 8.7 10*3/uL (ref 4.0–10.5)

## 2016-01-07 LAB — PROTIME-INR
INR: 4.65 — ABNORMAL HIGH (ref 0.00–1.49)
INR: 1.35 (ref 0.00–1.49)
PROTHROMBIN TIME: 42.5 s — AB (ref 11.6–15.2)
PROTHROMBIN TIME: 16.8 s — AB (ref 11.6–15.2)

## 2016-01-07 LAB — ABO/RH: ABO/RH(D): O POS

## 2016-01-07 LAB — TROPONIN I: Troponin I: <0.03 ng/mL (ref <0.031)

## 2016-01-07 LAB — CBG MONITORING, ED: GLUCOSE-CAPILLARY: 119 mg/dL — AB (ref 65–99)

## 2016-01-07 LAB — MRSA PCR SCREENING: MRSA by PCR: NEGATIVE

## 2016-01-07 LAB — DIGOXIN LEVEL: DIGOXIN LVL: 1 ng/mL (ref 0.8–2.0)

## 2016-01-07 LAB — TYPE AND SCREEN
ABO/RH(D): O POS
Antibody Screen: NEGATIVE

## 2016-01-07 LAB — GLUCOSE, CAPILLARY
GLUCOSE-CAPILLARY: 188 mg/dL — AB (ref 65–99)
GLUCOSE-CAPILLARY: 94 mg/dL (ref 65–99)

## 2016-01-07 MED ORDER — INSULIN ASPART 100 UNIT/ML ~~LOC~~ SOLN
0.0000 [IU] | SUBCUTANEOUS | Status: DC
  Administered 2016-01-07 – 2016-01-08 (×2): 4 [IU] via SUBCUTANEOUS
  Administered 2016-01-08 (×2): 7 [IU] via SUBCUTANEOUS
  Administered 2016-01-08: 4 [IU] via SUBCUTANEOUS
  Administered 2016-01-09: 11 [IU] via SUBCUTANEOUS
  Administered 2016-01-09 (×2): 7 [IU] via SUBCUTANEOUS
  Administered 2016-01-09: 4 [IU] via SUBCUTANEOUS
  Administered 2016-01-09: 15 [IU] via SUBCUTANEOUS
  Administered 2016-01-10 – 2016-01-11 (×4): 3 [IU] via SUBCUTANEOUS

## 2016-01-07 MED ORDER — METOPROLOL TARTRATE 50 MG PO TABS
100.0000 mg | ORAL_TABLET | Freq: Two times a day (BID) | ORAL | Status: DC
  Filled 2016-01-07: qty 2

## 2016-01-07 MED ORDER — DEXTROSE-NACL 5-0.9 % IV SOLN
INTRAVENOUS | Status: DC
  Administered 2016-01-07: 20:00:00 via INTRAVENOUS
  Administered 2016-01-08: 75 mL/h via INTRAVENOUS
  Administered 2016-01-09: 04:00:00 via INTRAVENOUS
  Administered 2016-01-09: 75 mL/h via INTRAVENOUS

## 2016-01-07 MED ORDER — HYPROMELLOSE (GONIOSCOPIC) 2.5 % OP SOLN: 1.0000 [drp] | Freq: Four times a day (QID) | OPHTHALMIC | Status: DC | PRN

## 2016-01-07 MED ORDER — PRAVASTATIN SODIUM 20 MG PO TABS
20.0000 mg | ORAL_TABLET | Freq: Every evening | ORAL | Status: DC
  Administered 2016-01-10 – 2016-01-15 (×6): 20 mg via ORAL
  Filled 2016-01-07 (×6): qty 1

## 2016-01-07 MED ORDER — AMLODIPINE BESYLATE 5 MG PO TABS: 5.0000 mg | ORAL_TABLET | Freq: Every day | ORAL | Status: DC

## 2016-01-07 MED ORDER — VALACYCLOVIR HCL 500 MG PO TABS
500.0000 mg | ORAL_TABLET | ORAL | Status: DC
  Administered 2016-01-10 – 2016-01-15 (×3): 500 mg via ORAL
  Filled 2016-01-07 (×3): qty 1

## 2016-01-07 MED ORDER — VITAMIN D (ERGOCALCIFEROL) 1.25 MG (50000 UNIT) PO CAPS: 50000.0000 [IU] | ORAL_CAPSULE | ORAL | Status: DC

## 2016-01-07 MED ORDER — ONDANSETRON HCL 4 MG/2ML IJ SOLN: 4.0000 mg | Freq: Four times a day (QID) | INTRAMUSCULAR | Status: DC | PRN

## 2016-01-07 MED ORDER — VITAMIN K1 10 MG/ML IJ SOLN
10.0000 mg | INTRAVENOUS | Status: AC
  Administered 2016-01-07: 10 mg via INTRAVENOUS
  Filled 2016-01-07: qty 1

## 2016-01-07 MED ORDER — PREDNISONE 5 MG PO TABS
5.0000 mg | ORAL_TABLET | Freq: Every day | ORAL | Status: DC
  Filled 2016-01-07: qty 1

## 2016-01-07 MED ORDER — FUROSEMIDE 20 MG PO TABS: 20.0000 mg | ORAL_TABLET | ORAL | Status: DC

## 2016-01-07 MED ORDER — PROTHROMBIN COMPLEX CONC HUMAN 500 UNITS IV KIT
1548.0000 [IU] | PACK | Status: AC
  Administered 2016-01-07: 1548 [IU] via INTRAVENOUS
  Filled 2016-01-07: qty 62

## 2016-01-07 MED ORDER — ONDANSETRON HCL 4 MG PO TABS: 4.0000 mg | ORAL_TABLET | Freq: Four times a day (QID) | ORAL | Status: DC | PRN

## 2016-01-07 MED ORDER — MYCOPHENOLATE MOFETIL 250 MG PO CAPS
500.0000 mg | ORAL_CAPSULE | Freq: Two times a day (BID) | ORAL | Status: DC
  Administered 2016-01-09 – 2016-01-16 (×14): 500 mg via ORAL
  Filled 2016-01-07 (×18): qty 2

## 2016-01-07 MED ORDER — DIGOXIN 125 MCG PO TABS
0.1250 mg | ORAL_TABLET | Freq: Every day | ORAL | Status: DC
  Filled 2016-01-07 (×2): qty 1

## 2016-01-07 MED ORDER — HYDROMORPHONE HCL 1 MG/ML IJ SOLN
0.5000 mg | INTRAMUSCULAR | Status: DC | PRN
  Administered 2016-01-07 – 2016-01-13 (×14): 0.5 mg via INTRAVENOUS
  Filled 2016-01-07 (×16): qty 1

## 2016-01-07 MED ORDER — CYCLOSPORINE MODIFIED (NEORAL) 25 MG PO CAPS
25.0000 mg | ORAL_CAPSULE | Freq: Two times a day (BID) | ORAL | Status: DC
  Administered 2016-01-09 – 2016-01-16 (×14): 25 mg via ORAL
  Filled 2016-01-07 (×19): qty 1

## 2016-01-07 NOTE — Progress Notes (Signed)
Dr Jordan Likes informed of pt's expressive aphasia. Stat CT ordered.

## 2016-01-07 NOTE — Consult Note (Signed)
Date: 01/07/2016               Patient Name:  [name] [name] MRN: [id number]  DOB: Jul 01, 1946 Age / Sex: 70 y.o., female   PCP: Marva Panda, NP         Requesting Physician: Dr. Minerva Fester Md, MD    Consulting Reason:  Management of multiple medical comorbidities     Chief Complaint: Subdural hematoma, pubic ramus fracture  History of Present Illness: 70 y/o woman with medical history significant for chronic Afib on coumadin, symptomatic bradycardia s/p PPM, severe mitral stenosis, HFpEF, severe PVD with critical LLE limb ischemia, remote L MCA CVA, and CKD s/p renal transplant (1999) who presented to the ED after a fall and striking her head on a bedside table. She remained at home during the day but called EMS for worsening headache. In the ED workup significant for left subdural hematoma and left upper and lower pubic ramus fracture. She did not have any focal neurological deficits are presentation. Rapid correction protocol with Kcentra and In vitamin K for reversal of her coumadin A/C was initiated. She was admitted to the neuro ICU for close monitoring with high risk of deterioration. Internal medicine service consulted for management recommendations of her numerous medical comorbidities.  Meds: Current Facility-Administered Medications  Medication Dose Route Frequency Provider Last Rate Last Dose  . amLODipine (NORVASC) tablet 5 mg  5 mg Oral Daily Harriette Bouillon, MD      . cycloSPORINE modified (NEORAL) capsule 25 mg  25 mg Oral BID Harriette Bouillon, MD      . dextrose 5 %-0.9 % sodium chloride infusion   Intravenous Continuous Harriette Bouillon, MD 75 mL/hr at 01/07/16 2000    . digoxin (LANOXIN) tablet 0.125 mg  0.125 mg Oral Daily Harriette Bouillon, MD      . furosemide (LASIX) tablet 20 mg  20 mg Oral QODAY Harriette Bouillon, MD      . HYDROmorphone (DILAUDID) injection 0.5 mg  0.5 mg Intravenous Q2H PRN Harriette Bouillon, MD   0.5 mg at 01/07/16 1935  . hydroxypropyl methylcellulose /  hypromellose (ISOPTO TEARS / GONIOVISC) 2.5 % ophthalmic solution 1 drop  1 drop Both Eyes QID PRN Harriette Bouillon, MD      . insulin aspart (novoLOG) injection 0-20 Units  0-20 Units Subcutaneous Q4H Harriette Bouillon, MD   4 Units at 01/07/16 1940  . metoprolol tartrate (LOPRESSOR) tablet 100 mg  100 mg Oral BID Harriette Bouillon, MD      . mycophenolate (CELLCEPT) capsule 500 mg  500 mg Oral BID Harriette Bouillon, MD      . ondansetron [facility]) tablet 4 mg  4 mg Oral Q6H PRN Harriette Bouillon, MD       Or  . ondansetron (ZOFRAN) injection 4 mg  4 mg Intravenous Q6H PRN Harriette Bouillon, MD      . pravastatin (PRAVACHOL) tablet 20 mg  20 mg Oral QPM Harriette Bouillon, MD      . predniSONE (DELTASONE) tablet 5 mg  5 mg Oral Daily Harriette Bouillon, MD      . Melene Muller ON 01/08/2016] valACYclovir (VALTREX) tablet 500 mg  500 mg Oral Q M,W,F Harriette Bouillon, MD      . Melene Muller ON 01/20/2016] Vitamin D (Ergocalciferol) (DRISDOL) capsule 50,000 Units  50,000 Units Oral Q30 days Harriette Bouillon, MD        Allergies: Allergies as of 01/07/2016 - Review Complete 01/07/2016  Allergen Reaction Noted  . Bactrim [sulfamethoxazole-trimethoprim] Itching 05/17/2012  .  Aspirin Nausea Only 10/29/2015  . Shellfish allergy Hives 11/20/2015  . Codeine Hives and Nausea Only   . Latex Rash 06/19/2013  . Penicillins Nausea And Vomiting and Rash 01/06/2012  . Tape Itching and Rash 11/19/2015   Past Medical History  Diagnosis Date  . Hypertension   . Stroke [facility]) December 2011    left MCA distribution  . Permanent atrial fibrillation (HCC)     coumadin clinic at [facility]. Has previously required Lovenox->Coumadin bridge.  . Severe mitral stenosis by prior echocardiogram     last echo 07/2012  . Aortic stenosis   . Symptomatic bradycardia     s/p pacer  . Depression   . HSV (herpes simplex virus) infection   . Anemia   . Hyperparathyroidism, primary (HCC)     s/p parathyroidectomy  . PAD (peripheral artery disease)  (HCC)     a. s/p RLE angioplasty with [name]. b. (by [name])  L popliteral artery orbital atherectomy and balloon angioplasty & L SFA orbital atherectomy and balloon angioplasty 11/30/12.  [name] PVD (peripheral vascular disease) (HCC)   . Presence of permanent cardiac pacemaker   . High cholesterol   . CHF (congestive heart failure) (HCC)   . TIA (transient ischemic attack) 08/2009    [name] 09/05/2009  . Atrial thrombus (HCC)     appendage/notes 09/05/2009  . Type II diabetes mellitus (HCC)   . History of blood transfusion     related to "kidney transplant"  . Chronic kidney disease     s/p renal transplant;  follows with [name]  . GERD (gastroesophageal reflux disease)     hx  . History of stomach ulcers   . Diabetic foot ulcer (HCC)     "left is worse than right" (11/20/2015)   Past Surgical History  Procedure Laterality Date  . Insert / replace / remove pacemaker    . Kidney transplant  December 1999  . Abdominal angiogram  11/30/2012    ABDOMINAL AORTIC ANGIOGRAM   DR ARDIA  . Dialysis fistula creation    . Abdominal aortagram N/A 11/30/2012    Procedure: ABDOMINAL [name];  Surgeon: [name], MD;  Location: [facility] CATH LAB;  Service: Cardiovascular;  Laterality: N/A;  . Peripheral vascular catheterization N/A 11/20/2015    Procedure: Abdominal Aortogram;  Surgeon: [name], MD;  Location: [facility] INVASIVE CV LAB;  Service: Cardiovascular;  Laterality: N/A;  . Peripheral vascular catheterization Bilateral 11/20/2015    Procedure: Lower Extremity Angiography;  Surgeon: [name], MD;  Location: [facility] INVASIVE CV LAB;  Service: Cardiovascular;  Laterality: Bilateral;  . Peripheral vascular catheterization  11/20/2015    Procedure: Peripheral Vascular Balloon Angioplasty;  Surgeon: [name], MD;  Location: [facility] INVASIVE CV LAB;  Service: Cardiovascular;;  . Tonsillectomy    . Appendectomy    . Hernia repair    . Umbilical hernia repair     Family  History  Problem Relation Age of Onset  . Adopted: Yes  . Kidney disease Brother     died from  . Kidney disease Sister     died from  . Heart attack Neg Hx   . Stroke Neg Hx    Social History   Social History  . Marital Status: Legally Separated    Spouse Name: N/A  . Number of Children: N/A  . Years of Education: N/A   Occupational History  . Not on file.   Social History Main Topics  .  Smoking status: Former Smoker -- 0.25 packs/day for 10 years    Types: Cigarettes  . Smokeless tobacco: Never Used     Comment: "quit smoking cigarettes in the 1990s"  . Alcohol Use: No  . Drug Use: No  . Sexual Activity: No   Other Topics Concern  . Not on file   Social History Narrative    Review of Systems: Review of Systems  Constitutional: Negative for fever.  Eyes: Negative for blurred vision.  Respiratory: Negative for shortness of breath.   Cardiovascular: Negative for chest pain.  Gastrointestinal: Negative for vomiting and abdominal pain.  Genitourinary: Negative for flank pain.  Musculoskeletal: Positive for joint pain and falls.  Skin: Negative for rash.  Neurological: Positive for headaches. Negative for focal weakness.  Endo/Heme/Allergies: Bruises/bleeds easily.  Psychiatric/Behavioral: Negative for memory loss.    Physical Exam: Blood pressure 151/87, pulse 35, temperature 98.3 F (36.8 C), temperature source Oral, resp. rate 24, height 5\' 2"  (1.575 m), weight 38.2 kg (84 lb 3.5 oz), SpO2 100 %.  GENERAL- alert and oriented x3, cachectic appearing elderly woman, slightly slowed word finding, NAD HEENT- Atraumatic, PERRL, EOMI, oral mucosa appears moist CARDIAC- RRR, 3/6 murmur across L sternal border, pacemaker in place L chest RESP- CTAB, no wheezes or crackles. ABDOMEN- Soft, nontender, no guarding or rebound, normoactive bowel sounds present NEURO- CN II-XII grossly intact, strength upper and lower extremities- 5/5, Sensation intact  globally EXTREMITIES- radial pulses 2+, DP and PT pulses not palpable, ulceration on the interdigital surfaces of left toes and particular 4th toe appears gangrenous and worse ulceration on lateral aspect of 5th toe, right foot milder ulceration only the tip of 1st and 2nd toes SKIN- Warm, dry, foot ulcerations as above PSYCH- Normal mood and affect, appropriate thought content and speech.   Lab results: Basic Metabolic Panel:  Recent Labs  16/10/96 1636 01/07/16 2145  NA 138 135  K 4.0 3.7  CL 101 99*  CO2 26 25  GLUCOSE 89 127*  BUN 17 14  CREATININE 0.98 0.94  CALCIUM 9.6 9.2   Liver Function Tests:  Recent Labs  01/07/16 1636 01/07/16 2145  AST 60* 53*  ALT 43 39  ALKPHOS 99 100  BILITOT 1.0 1.0  PROT 6.7 6.4*  ALBUMIN 3.5 3.1*   No results for input(s): LIPASE, AMYLASE in the last 72 hours. No results for input(s): AMMONIA in the last 72 hours. CBC:  Recent Labs  01/07/16 1636 01/07/16 2145  WBC 9.4 8.7  HGB 13.4 13.9  HCT 40.4 41.7  MCV 98.3 98.1  PLT 274 248   Cardiac Enzymes:  Recent Labs  01/07/16 1636  TROPONINI <0.03   BNP: No results for input(s): PROBNP in the last 72 hours. D-Dimer: No results for input(s): DDIMER in the last 72 hours. CBG:  Recent Labs  01/07/16 1812 01/07/16 1934  GLUCAP 119* 188*   Hemoglobin A1C: No results for input(s): HGBA1C in the last 72 hours. Fasting Lipid Panel: No results for input(s): CHOL, HDL, LDLCALC, TRIG, CHOLHDL, LDLDIRECT in the last 72 hours. Thyroid Function Tests: No results for input(s): TSH, T4TOTAL, FREET4, T3FREE, THYROIDAB in the last 72 hours. Anemia Panel: No results for input(s): VITAMINB12, FOLATE, FERRITIN, TIBC, IRON, RETICCTPCT in the last 72 hours. Coagulation:  Recent Labs  01/07/16 1636 01/07/16 2145  LABPROT 42.5* 16.8*  INR 4.65* 1.35   Urine Drug Screen: Drugs of Abuse  No results found for: LABOPIA, COCAINSCRNUR, LABBENZ, AMPHETMU, THCU, LABBARB  Alcohol  Level:  No results for input(s): ETH in the last 72 hours. Urinalysis: No results for input(s): COLORURINE, LABSPEC, PHURINE, GLUCOSEU, HGBUR, BILIRUBINUR, KETONESUR, PROTEINUR, UROBILINOGEN, NITRITE, LEUKOCYTESUR in the last 72 hours.  Invalid input(s): APPERANCEUR   Imaging results:  Dg Chest 2 View  01/07/2016  CLINICAL DATA:  Pain following fall. History of mitral stenosis. Patient also with history of primary hyperparathyroidism EXAM: CHEST  2 VIEW COMPARISON:  November 17, 2015 FINDINGS: Widespread parenchymal calcification is noted, stable. No edema or consolidation. There is cardiomegaly with pulmonary vascularity within normal limits. Pacemaker lead is attached to the right ventricle. No adenopathy is apparent. There is calcification in the aortic arch region. No bone lesions are evident. There are surgical clips in the medial left upper thorax. No pneumothorax. No acute fracture evident. IMPRESSION: Widespread pulmonary parenchymal calcification. This calcification may represent metastatic pulmonary calcification from primary hyperparathyroidism. This calcification also may represent pulmonary ossification secondary to chronic long-standing mitral stenosis. Contribution from both entities is possible. The appearance is stable. There is stable cardiomegaly with primarily left atrial and right heart enlargement consistent with the chronic mitral stenosis. No edema or consolidation. No pneumothorax or acute fracture. Electronically Signed   By: Bretta Bang III M.D.   On: 01/07/2016 16:32   Dg Ankle Complete Right  01/07/2016  CLINICAL DATA:  Fall from upright position this morning, right ankle pain EXAM: RIGHT ANKLE - COMPLETE 3+ VIEW COMPARISON:  None. FINDINGS: Three views of the right ankle submitted. Atherosclerotic vascular calcifications are noted. Tiny plantar spur of calcaneus. Ankle mortise is preserved. IMPRESSION: No acute fracture or subluxation. Atherosclerotic vascular  calcifications are noted. Tiny plantar spur of calcaneus. Electronically Signed   By: Natasha Mead M.D.   On: 01/07/2016 16:36   Ct Head Wo Contrast  01/07/2016  CLINICAL DATA:  Post fall hitting left frontal region of head now with headache, dizziness and blurred vision. Headache radiating to the left side of the neck. EXAM: CT HEAD WITHOUT CONTRAST CT CERVICAL SPINE WITHOUT CONTRAST TECHNIQUE: Multidetector CT imaging of the head and cervical spine was performed following the standard protocol without intravenous contrast. Multiplanar CT image reconstructions of the cervical spine were also generated. COMPARISON:  Head CT - 11/17/2015 ; 08/18/2009 FINDINGS: CT HEAD FINDINGS There is a minimal amount of asymmetric soft tissue swelling about the left parietal calvarium (image 22, series 2). This finding is without associated displaced calvarial fracture or radiopaque foreign body. There is a moderate sized crescentic subdural hematoma about primarily the left frontoparietal lobe which measures approximately 0.9 cm in greatest diameter (image 19, series 2). Additionally, there is a small amount of subarachnoid hemorrhage seen within several sulci about the left parietal lobe (Representative image 21, series 2). No intraventricular extension. Minimal regional mass effect with associated approximately 3 mm of left-to-right midline shift and very minimal effacement of the left lateral ventricle. There is a suspected very tiny (approximately 2 mm) subdural hematoma about the right frontal lobe (representative images 14 and 16, series 2) without associated adjacent mass effect. There is an old subcortical infarct involving the posterior aspect of the right parietal lobe (is 22, series 2). No CT evidence of superimposed acute large territory infarct. No intraparenchymal or extra-axial mass. No evidence of hydrocephalus. Extensive intracranial vascular calcifications. Additionally, there is exuberant vascular calcifications  about the small vessels of the scalp as well as the tentorium and about the dura with mottled mixed lytic and sclerotic appearance of the calvarium, unchanged and likely compatible with renal  osteodystrophy in the setting of a patient with known prior renal transplantation. CT CERVICAL SPINE FINDINGS C1 to the superior endplate of T4 is imaged. There is minimal (approximately 2 mm) of anterolisthesis of C5 upon C6. The bilateral facets are normally aligned. The dens is normally positioned between the lateral masses of C1. Mild degenerative change of the atlantodental articulation. Normal atlantoaxial articulations. No fracture or static subluxation of the cervical spine. Cervical vertebral and heights are preserved. Prevertebral soft tissues are normal. Mild multilevel cervical spine DDD, worse at C6-C7 with disc space height loss, endplate irregularity and sclerosis. Calcifications within the bilateral carotid bulbs, right greater than left. Extensive small vessel vascular calcifications about the neck and base of skull. No bulky cervical lymphadenopathy on this noncontrast examination. Normal noncontrast appearance of the thyroid gland. Limited visualization of lung apices demonstrates rather extensive biapical ill-defined nodular airspace opacities (representative image 105, series 3). Right anterior chest wall pacemaker. IMPRESSION: Head CT Impression: 1. Moderate-sized subdural hematoma about the left frontoparietal lobe measuring approximately 9 mm in diameter and resulting in approximately 3 mm left-to-right midline shift. 2. Small amount of subarachnoid hemorrhage located within several adjacent sulci of the left parietal lobe. No intraventricular extension. 3. Suspected tiny (approximately 2 mm) subdural hematoma about the contralateral right frontal lobe without associated mass effect 4. Minimal soft tissue swelling about the left parietal calvarium without associated fracture radiopaque foreign body. 5.  Old infarct involving the posterior subcortical aspect of the right parietal lobe. Cervical spine CT Impression: 1. No fracture static subluxation of cervical spine. 2. Rather extensive nodular airspace opacities within the imaged bilateral lung apices, incompletely evaluated. Further evaluation with nonemergent dedicated chest CT could be performed as clinically indicated. 3. 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 Critical Value/emergent results were called by telephone at the time of interpretation on 01/07/2016 at 4:54 pm to Dr. Jacalyn Lefevre , who verbally acknowledged these results. Electronically Signed   By: Simonne Come M.D.   On: 01/07/2016 17:01   Ct Cervical Spine Wo Contrast  01/07/2016  CLINICAL DATA:  Post fall hitting left frontal region of head now with headache, dizziness and blurred vision. Headache radiating to the left side of the neck. EXAM: CT HEAD WITHOUT CONTRAST CT CERVICAL SPINE WITHOUT CONTRAST TECHNIQUE: Multidetector CT imaging of the head and cervical spine was performed following the standard protocol without intravenous contrast. Multiplanar CT image reconstructions of the cervical spine were also generated. COMPARISON:  Head CT - 11/17/2015 ; 08/18/2009 FINDINGS: CT HEAD FINDINGS There is a minimal amount of asymmetric soft tissue swelling about the left parietal calvarium (image 22, series 2). This finding is without associated displaced calvarial fracture or radiopaque foreign body. There is a moderate sized crescentic subdural hematoma about primarily the left frontoparietal lobe which measures approximately 0.9 cm in greatest diameter (image 19, series 2). Additionally, there is a small amount of subarachnoid hemorrhage seen within several sulci about the left parietal lobe (Representative image 21, series 2). No intraventricular extension. Minimal regional mass effect with associated approximately 3 mm of left-to-right midline shift and very  minimal effacement of the left lateral ventricle. There is a suspected very tiny (approximately 2 mm) subdural hematoma about the right frontal lobe (representative images 14 and 16, series 2) without associated adjacent mass effect. There is an old subcortical infarct involving the posterior aspect of the right parietal lobe (is 22, series 2). No CT evidence of superimposed acute large territory infarct. No intraparenchymal or  extra-axial mass. No evidence of hydrocephalus. Extensive intracranial vascular calcifications. 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 CT CERVICAL SPINE FINDINGS C1 to the superior endplate of T4 is imaged. There is minimal (approximately 2 mm) of anterolisthesis of C5 upon C6. The bilateral facets are normally aligned. The dens is normally positioned between the lateral masses of C1. Mild degenerative change of the atlantodental articulation. Normal atlantoaxial articulations. No fracture or static subluxation of the cervical spine. Cervical vertebral and heights are preserved. Prevertebral soft tissues are normal. Mild multilevel cervical spine DDD, worse at C6-C7 with disc space height loss, endplate irregularity and sclerosis. Calcifications within the bilateral carotid bulbs, right greater than left. Extensive small vessel vascular calcifications about the neck and base of skull. No bulky cervical lymphadenopathy on this noncontrast examination. Normal noncontrast appearance of the thyroid gland. Limited visualization of lung apices demonstrates rather extensive biapical ill-defined nodular airspace opacities (representative image 105, series 3). Right anterior chest wall pacemaker. IMPRESSION: Head  CT Impression: 1. Moderate-sized subdural hematoma about the left frontoparietal lobe measuring approximately 9 mm in diameter and resulting in approximately 3 mm left-to-right midline shift. 2. Small amount of subarachnoid hemorrhage located within several adjacent sulci of the left parietal lobe. No intraventricular extension. 3. Suspected tiny (approximately 2 mm) subdural hematoma about the contralateral right frontal lobe without associated mass effect 4. Minimal soft tissue swelling about the left parietal calvarium without associated fracture radiopaque foreign body. 5. Old infarct involving the posterior subcortical aspect of the right parietal lobe. Cervical spine CT Impression: 1. No fracture static subluxation of cervical spine. 2. Rather extensive nodular airspace opacities within the imaged bilateral lung apices, incompletely evaluated. Further evaluation with nonemergent dedicated chest CT could be performed as clinically indicated. 3. 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 Critical Value/emergent results were called by telephone at the time of interpretation on 01/07/2016 at 4:54 pm to Dr. Jacalyn Lefevre , who verbally acknowledged these results. Electronically Signed   By: Simonne Come M.D.   On: 01/07/2016 17:01   Dg Hip Unilat With Pelvis 2-3 Views Left  01/07/2016  CLINICAL DATA:  Fall, LEFT groin pain EXAM: DG HIP (WITH OR WITHOUT PELVIS) 2-3V LEFT COMPARISON:  None. FINDINGS: There is a fracture of the LEFT superior and inferior pubic rami. These fractures are minimally displaced. Potential extension of fracture line to the LEFT acetabulum. Hips are located. Dedicated view of the LEFT hip demonstrates no femoral neck fracture. IMPRESSION: Minimally displaced fractures of the LEFT superior and inferior pubic rami. Potential extension to the LEFT acetabulum. Electronically Signed   By: Genevive Bi M.D.   On: 01/07/2016 16:32    Other results: EKG: normal  EKG, normal sinus rhythm, unchanged from previous tracings, atrial fibrillation, rate 75bpm, low voltage recording.  Assessment, Plan, & Recommendations by Problem: Subdural hematoma: Currently stable neurological findings. INR 4.65 at presentation. Anticoagulation reversal with Kcentra and vitK, f/u head CT per neurosurgery service.  Pubic ramus fracture: Multiple minimally displace Fx without overlying wounds. Orthopedic service consulted for evaluation. Pain control with PRN dilaudid tolerating with some drowsiness no AMS.  Atrial fibrillation: Supertherapeutic INR at presentation. Anticoagulation reversal and discontinuation at this time. She does have a history of prior CVA  and left atrial appendage thrombus. Continue PTA digoxin 0.125mg .  Severe PAD with ischemia of LLE and ulceration: Nonpalpable lower extremity pulses. Ulceration of the toes on both feet L > R. Some gangrenous changes of left 4th toe. Per outpatient cardiology recs and recent angioplasty LLE revascularization is unlikely to benefit and agree would benefit from BKA. No sign of infection at this time and lower acuity than new SDH and ramus fracture. >Ordered wound care consultation order for foot uclers  CKD s/p renal transplant: Remote transplant (1999), on mycofenolate, cyclosporine, prednisone 5. Plus HSV reactivation ppx valacyclovir.  Insulin dependent diabetes mellitus: On levemir and januvia PTA. Is on chronic prednisone. Agree with SSI-R for now. Assess insulin TDD requirement at 24hrs and pending diet for basal dose.  HFpEF, Hypertension: Stable hemodynamics at this time. Lopressor 100mg  BID, amlodipine 5mg  at PTA dose. Lasix QOD, currently seems euvolemic consider increase if evidence of volume overload.  New pulmonary radiographic findings: Numerous nodular changes in lung apices incidentally demonstrated on CT. Calcifications also demonstrated on CXR. Could be sequelae of primary hyperparathyroidism Hx or renally  mediated osteodystrophy. However agree with recommended high res chest CT in follow up setting.  Dispo: Disposition is deferred at this time, awaiting improvement of current medical problems.  The patient does have a current PCP Marva Panda, NP) and needs a hospital follow-up appointment after discharge.  Signed: Fuller Plan, MD 01/07/2016, 10:45 PM

## 2016-01-07 NOTE — ED Notes (Signed)
Attempted report 

## 2016-01-07 NOTE — ED Provider Notes (Signed)
CSN: [id number]     Arrival date & time 01/07/16  1513 History   First MD Initiated Contact with Patient 01/07/16 1521     Chief Complaint  Patient presents with  . Fall  PT SAID THAT SHE FELL OOB THIS MORNING AROUND 0300.  THE PT SAID SHE HIT HER HEAD ON THE NIGHT TABLE BESIDE HER BED.  EMS WAS CALLED, BUT PT REFUSED TRANSPORT.  THE PT SAID SHE'S BEEN UNABLE TO GET OOB SINCE SHE FELL.  PT HAS A HX OF CRITICAL LIMB ISCHEMIA AND IS SCHEDULED FOR A PARTIAL FOOT AMPUTATION NEXT WEEK.  BOTH OF HER FEET ARE HURTING.  PT ALSO C/O LEFT GROIN PAIN WHERE SHE HAD THE PROCEDURE TO TRY TO OPEN HER ARTERIES.    (Consider location/radiation/quality/duration/timing/severity/associated sxs/prior Treatment) The history is provided by the patient.    Past Medical History  Diagnosis Date  . Hypertension   . Stroke [facility]) December 2011    left MCA distribution  . Permanent atrial fibrillation (HCC)     coumadin clinic at [facility]. Has previously required Lovenox->Coumadin bridge.  . Severe mitral stenosis by prior echocardiogram     last echo 07/2012  . Aortic stenosis   . Symptomatic bradycardia     s/p pacer  . Depression   . HSV (herpes simplex virus) infection   . Anemia   . Hyperparathyroidism, primary (HCC)     s/p parathyroidectomy  . PAD (peripheral artery disease) (HCC)     a. s/p RLE angioplasty with [name]. b. (by [name])  L popliteral artery orbital atherectomy and balloon angioplasty & L SFA orbital atherectomy and balloon angioplasty 11/30/12.  [name] PVD (peripheral vascular disease) (HCC)   . Presence of permanent cardiac pacemaker   . High cholesterol   . CHF (congestive heart failure) (HCC)   . TIA (transient ischemic attack) 08/2009    [name] 09/05/2009  . Atrial thrombus (HCC)     appendage/notes 09/05/2009  . Type II diabetes mellitus (HCC)   . History of blood transfusion     related to "kidney transplant"  . Chronic kidney disease     s/p renal transplant;   follows with [name]  . GERD (gastroesophageal reflux disease)     hx  . History of stomach ulcers   . Diabetic foot ulcer (HCC)     "left is worse than right" (11/20/2015)   Past Surgical History  Procedure Laterality Date  . Insert / replace / remove pacemaker    . Kidney transplant  December 1999  . Abdominal angiogram  11/30/2012    ABDOMINAL AORTIC ANGIOGRAM   DR ARDIA  . Dialysis fistula creation    . Abdominal aortagram N/A 11/30/2012    Procedure: ABDOMINAL [name];  Surgeon: [name], MD;  Location: [facility] CATH LAB;  Service: Cardiovascular;  Laterality: N/A;  . Peripheral vascular catheterization N/A 11/20/2015    Procedure: Abdominal Aortogram;  Surgeon: [name], MD;  Location: [facility] INVASIVE CV LAB;  Service: Cardiovascular;  Laterality: N/A;  . Peripheral vascular catheterization Bilateral 11/20/2015    Procedure: Lower Extremity Angiography;  Surgeon: [name], MD;  Location: [facility] INVASIVE CV LAB;  Service: Cardiovascular;  Laterality: Bilateral;  . Peripheral vascular catheterization  11/20/2015    Procedure: Peripheral Vascular Balloon Angioplasty;  Surgeon: [name], MD;  Location: [facility] INVASIVE CV LAB;  Service: Cardiovascular;;  . Tonsillectomy    . Appendectomy    . Hernia repair    .  Umbilical hernia repair     Family History  Problem Relation Age of Onset  . Adopted: Yes  . Kidney disease Brother     died from  . Kidney disease Sister     died from  . Heart attack Neg Hx   . Stroke Neg Hx    Social History  Substance Use Topics  . Smoking status: Former Smoker -- 0.25 packs/day for 10 years    Types: Cigarettes  . Smokeless tobacco: Never Used     Comment: "quit smoking cigarettes in the 1990s"  . Alcohol Use: No   OB History    No data available     Review of Systems  Musculoskeletal: Positive for neck pain.       LEFT GROIN PAIN  Neurological: Positive for headaches.  All other systems reviewed and are  negative.     Allergies  Bactrim; Aspirin; Shellfish allergy; Codeine; Latex; Penicillins; and Tape  Home Medications   Prior to Admission medications   Medication Sig Start Date End Date Taking? Authorizing Provider  acetaminophen (TYLENOL) 500 MG tablet Take 500 mg by mouth every 6 (six) hours as needed. For pain    Historical Provider, MD  albuterol (PROVENTIL) (2.5 MG/3ML) 0.083% nebulizer solution Take 2.5 mg by nebulization every 6 (six) hours as needed for wheezing or shortness of breath.    Historical Provider, MD  amLODipine (NORVASC) 5 MG tablet Take 1 tablet (5 mg total) by mouth daily. 10/08/15   [name], MD  azelastine (OPTIVAR) 0.05 % ophthalmic solution Place 1 drop into both eyes 2 (two) times daily as needed (for dry eyes).     Historical Provider, MD  b complex-vitamin c-folic acid (NEPHRO-VITE) 0.8 MG TABS Take 1 tablet by mouth daily.     Historical Provider, MD  Calcium Carbonate-Vitamin D (CALTRATE 600+D) 600-400 MG-UNIT per tablet Take 1 tablet by mouth 2 (two) times daily.     Historical Provider, MD  cephALEXin (KEFLEX) 500 MG capsule Take 1 capsule (500 mg total) by mouth 3 (three) times daily. 12/24/15   [name], MD  clindamycin (CLEOCIN) 150 MG capsule Take 3 capsules (450 mg total) by mouth every 6 (six) hours. 11/10/15   Melene Plan, DO  clopidogrel (PLAVIX) 75 MG tablet Take 1 tablet (75 mg total) by mouth daily. 11/12/15 11/11/16  [name], MD  cycloSPORINE modified (NEORAL/GENGRAF) 25 MG capsule Take 25 mg by mouth 2 (two) times daily.      Historical Provider, MD  digoxin (LANOXIN) 0.125 MG tablet Take 0.125 mg by mouth daily.  05/27/11   Historical Provider, MD  ergocalciferol (VITAMIN D2) 50000 UNITS capsule Take 50,000 Units by mouth every 30 (thirty) days. First of the month    Historical Provider, MD  fluticasone (FLONASE) 50 MCG/ACT nasal spray Place 2 sprays into the nose daily.    Historical Provider, MD  furosemide (LASIX) 20 MG tablet  TAKE 1 TABLET BY MOUTH EVERY OTHER DAY 10/04/15   [name], MD  hydroxypropyl methylcellulose (ISOPTO TEARS) 2.5 % ophthalmic solution Place 1 drop into both eyes 4 (four) times daily as needed (dry eyes).    Historical Provider, MD  iron polysaccharides (NIFEREX) 150 MG capsule Take 150 mg by mouth daily.  06/03/07   Historical Provider, MD  JANUVIA 50 MG tablet Take 1 tablet by mouth daily. 11/25/15   Historical Provider, MD  LEVEMIR FLEXTOUCH 100 UNIT/ML Pen Inject 100 Units as directed at bedtime. 11/12/15   Historical  Provider, MD  lidocaine (XYLOCAINE) 5 % ointment Apply 1 application topically as needed. For foot pain 11/04/15   Asencion Islam, DPM  loratadine (CLARITIN) 10 MG tablet Take 10 mg by mouth daily as needed for allergies.     Historical Provider, MD  metoprolol (LOPRESSOR) 100 MG tablet TAKE 1 TABLET BY MOUTH TWICE A DAY 12/09/15   [name], MD  Multiple Vitamins-Minerals (ONE-A-DAY WOMENS 50 PLUS PO) Take 1 tablet by mouth daily.    Historical Provider, MD  mycophenolate (CELLCEPT) 250 MG capsule Take 500 mg by mouth 2 (two) times daily.     Historical Provider, MD  ONE TOUCH ULTRA TEST test strip TEST BLOOD SUGAR 3 TIMES A DAY (E11.9) 08/15/15   Historical Provider, MD  pravastatin (PRAVACHOL) 20 MG tablet Take 1 tablet (20 mg total) by mouth every evening. 11/25/15   Rosalee Kaufman, RPH-CPP  predniSONE (DELTASONE) 5 MG tablet Take 5 mg by mouth daily.      Historical Provider, MD  sitaGLIPtin (JANUVIA) 100 MG tablet Take 100 mg by mouth daily.      Historical Provider, MD  traMADol (ULTRAM) 50 MG tablet Take 1 tablet (50 mg total) by mouth every 8 (eight) hours as needed for moderate pain. 12/31/15   Asencion Islam, DPM  valACYclovir (VALTREX) 500 MG tablet Take 500 mg by mouth every Monday, Wednesday, and Friday. Only on Monday, Wednesday and Friday    Historical Provider, MD  warfarin (COUMADIN) 2.5 MG tablet 1 Tab daily M,W,Th,F,Sun; 1/2 tab daily Tues/Sat. 11/23/15    Ok Anis, NP   BP 158/87 mmHg  Pulse 58  Resp 24  Ht 5' (1.524 m)  Wt 92 lb (41.731 kg)  BMI 17.97 kg/m2  SpO2 100% Physical Exam  Constitutional: She is oriented to person, place, and time. She appears well-developed and well-nourished.  HENT:  Head: Normocephalic.    Right Ear: External ear normal.  Left Ear: External ear normal.  Nose: Nose normal.  Mouth/Throat: Oropharynx is clear and moist.  Eyes: Conjunctivae and EOM are normal. Pupils are equal, round, and reactive to light.  Neck: Normal range of motion. Neck supple.  Cardiovascular: Normal rate, regular rhythm, normal heart sounds and intact distal pulses.   Pulmonary/Chest: Effort normal and breath sounds normal.  Abdominal: Soft. Bowel sounds are normal.  Musculoskeletal: Normal range of motion.  Neurological: She is alert and oriented to person, place, and time.  Skin: Skin is warm and dry.  Psychiatric: She has a normal mood and affect. Her behavior is normal. Thought content normal.  Nursing note and vitals reviewed.   ED Course  .Critical Care Performed by: Jacalyn Lefevre Authorized by: Jacalyn Lefevre Critical care time was exclusive of separately billable procedures and treating other patients. Critical care was necessary to treat or prevent imminent or life-threatening deterioration of the following conditions: trauma. Critical care was time spent personally by me on the following activities: development of treatment plan with patient or surrogate, discussions with consultants, evaluation of patient's response to treatment, examination of patient, ordering and performing treatments and interventions, obtaining history from patient or surrogate, ordering and review of laboratory studies, ordering and review of radiographic studies, review of old charts and re-evaluation of patient's condition.   (including critical care time) Labs Review Labs Reviewed  COMPREHENSIVE METABOLIC PANEL - Abnormal;  Notable for the following:    AST 60 (*)    GFR calc non Af Amer 58 (*)    All other components within  normal limits  PROTIME-INR - Abnormal; Notable for the following:    Prothrombin Time 42.5 (*)    INR 4.65 (*)    All other components within normal limits  CBC  TROPONIN I  URINALYSIS, ROUTINE W REFLEX MICROSCOPIC (NOT AT [facility])  DIGOXIN LEVEL  PROTIME-INR  PROTIME-INR  CBC  COMPREHENSIVE METABOLIC PANEL  PROTIME-INR  TYPE AND SCREEN    Imaging Review Dg Chest 2 View  01/07/2016  CLINICAL DATA:  Pain following fall. History of mitral stenosis. Patient also with history of primary hyperparathyroidism EXAM: CHEST  2 VIEW COMPARISON:  November 17, 2015 FINDINGS: Widespread parenchymal calcification is noted, stable. No edema or consolidation. There is cardiomegaly with pulmonary vascularity within normal limits. Pacemaker lead is attached to the right ventricle. No adenopathy is apparent. There is calcification in the aortic arch region. No bone lesions are evident. There are surgical clips in the medial left upper thorax. No pneumothorax. No acute fracture evident. IMPRESSION: Widespread pulmonary parenchymal calcification. This calcification may represent metastatic pulmonary calcification from primary hyperparathyroidism. This calcification also may represent pulmonary ossification secondary to chronic long-standing mitral stenosis. Contribution from both entities is possible. The appearance is stable. There is stable cardiomegaly with primarily left atrial and right heart enlargement consistent with the chronic mitral stenosis. No edema or consolidation. No pneumothorax or acute fracture. Electronically Signed   By: Bretta Bang III M.D.   On: 01/07/2016 16:32   Dg Ankle Complete Right  01/07/2016  CLINICAL DATA:  Fall from upright position this morning, right ankle pain EXAM: RIGHT ANKLE - COMPLETE 3+ VIEW COMPARISON:  None. FINDINGS: Three views of the right ankle submitted.  Atherosclerotic vascular calcifications are noted. Tiny plantar spur of calcaneus. Ankle mortise is preserved. IMPRESSION: No acute fracture or subluxation. Atherosclerotic vascular calcifications are noted. Tiny plantar spur of calcaneus. Electronically Signed   By: Natasha Mead M.D.   On: 01/07/2016 16:36   Ct Head Wo Contrast  01/07/2016  CLINICAL DATA:  Post fall hitting left frontal region of head now with headache, dizziness and blurred vision. Headache radiating to the left side of the neck. EXAM: CT HEAD WITHOUT CONTRAST CT CERVICAL SPINE WITHOUT CONTRAST TECHNIQUE: Multidetector CT imaging of the head and cervical spine was performed following the standard protocol without intravenous contrast. Multiplanar CT image reconstructions of the cervical spine were also generated. COMPARISON:  Head CT - 11/17/2015 ; 08/18/2009 FINDINGS: CT HEAD FINDINGS There is a minimal amount of asymmetric soft tissue swelling about the left parietal calvarium (image 22, series 2). This finding is without associated displaced calvarial fracture or radiopaque foreign body. There is a moderate sized crescentic subdural hematoma about primarily the left frontoparietal lobe which measures approximately 0.9 cm in greatest diameter (image 19, series 2). Additionally, there is a small amount of subarachnoid hemorrhage seen within several sulci about the left parietal lobe (Representative image 21, series 2). No intraventricular extension. Minimal regional mass effect with associated approximately 3 mm of left-to-right midline shift and very minimal effacement of the left lateral ventricle. There is a suspected very tiny (approximately 2 mm) subdural hematoma about the right frontal lobe (representative images 14 and 16, series 2) without associated adjacent mass effect. There is an old subcortical infarct involving the posterior aspect of the right parietal lobe (is 22, series 2). No CT evidence of superimposed acute large territory  infarct. No intraparenchymal or extra-axial mass. No evidence of hydrocephalus. Extensive intracranial vascular calcifications. Additionally, there is exuberant vascular calcifications about the  small vessels of the scalp as well as the tentorium and about the dura with mottled mixed lytic and sclerotic appearance of the calvarium, unchanged and likely compatible with renal osteodystrophy in the setting of a patient with known prior renal transplantation. CT CERVICAL SPINE FINDINGS C1 to the superior endplate of T4 is imaged. There is minimal (approximately 2 mm) of anterolisthesis of C5 upon C6. The bilateral facets are normally aligned. The dens is normally positioned between the lateral masses of C1. Mild degenerative change of the atlantodental articulation. Normal atlantoaxial articulations. No fracture or static subluxation of the cervical spine. Cervical vertebral and heights are preserved. Prevertebral soft tissues are normal. Mild multilevel cervical spine DDD, worse at C6-C7 with disc space height loss, endplate irregularity and sclerosis. Calcifications within the bilateral carotid bulbs, right greater than left. Extensive small vessel vascular calcifications about the neck and base of skull. No bulky cervical lymphadenopathy on this noncontrast examination. Normal noncontrast appearance of the thyroid gland. Limited visualization of lung apices demonstrates rather extensive biapical ill-defined nodular airspace opacities (representative image 105, series 3). Right anterior chest wall pacemaker. IMPRESSION: Head CT Impression: 1. Moderate-sized subdural hematoma about the left frontoparietal lobe measuring approximately 9 mm in diameter and resulting in approximately 3 mm left-to-right midline shift. 2. Small amount of subarachnoid hemorrhage located within several adjacent sulci of the left parietal lobe. No intraventricular extension. 3. Suspected tiny (approximately 2 mm) subdural hematoma about the  contralateral right frontal lobe without associated mass effect 4. Minimal soft tissue swelling about the left parietal calvarium without associated fracture radiopaque foreign body. 5. Old infarct involving the posterior subcortical aspect of the right parietal lobe. Cervical spine CT Impression: 1. No fracture static subluxation of cervical spine. 2. Rather extensive nodular airspace opacities within the imaged bilateral lung apices, incompletely evaluated. Further evaluation with nonemergent dedicated chest CT could be performed as clinically indicated. 3. 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 Critical Value/emergent results were called by telephone at the time of interpretation on 01/07/2016 at 4:54 pm to Dr. Jacalyn Lefevre , who verbally acknowledged these results. Electronically Signed   By: Simonne Come M.D.   On: 01/07/2016 17:01   Ct Cervical Spine Wo Contrast  01/07/2016  CLINICAL DATA:  Post fall hitting left frontal region of head now with headache, dizziness and blurred vision. Headache radiating to the left side of the neck. EXAM: CT HEAD WITHOUT CONTRAST CT CERVICAL SPINE WITHOUT CONTRAST TECHNIQUE: Multidetector CT imaging of the head and cervical spine was performed following the standard protocol without intravenous contrast. Multiplanar CT image reconstructions of the cervical spine were also generated. COMPARISON:  Head CT - 11/17/2015 ; 08/18/2009 FINDINGS: CT HEAD FINDINGS There is a minimal amount of asymmetric soft tissue swelling about the left parietal calvarium (image 22, series 2). This finding is without associated displaced calvarial fracture or radiopaque foreign body. There is a moderate sized crescentic subdural hematoma about primarily the left frontoparietal lobe which measures approximately 0.9 cm in greatest diameter (image 19, series 2). Additionally, there is a small amount of subarachnoid hemorrhage seen within several sulci about the left  parietal lobe (Representative image 21, series 2). No intraventricular extension. Minimal regional mass effect with associated approximately 3 mm of left-to-right midline shift and very minimal effacement of the left lateral ventricle. There is a suspected very tiny (approximately 2 mm) subdural hematoma about the right frontal lobe (representative images 14 and 16, series 2) without associated adjacent mass effect. There  is an old subcortical infarct involving the posterior aspect of the right parietal lobe (is 22, series 2). No CT evidence of superimposed acute large territory infarct. No intraparenchymal or extra-axial mass. No evidence of hydrocephalus. Extensive intracranial vascular calcifications. 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 CT CERVICAL SPINE FINDINGS C1 to the superior endplate of T4 is imaged. There is minimal (approximately 2 mm) of anterolisthesis of C5 upon C6. The bilateral facets are normally aligned. The dens is normally positioned between the lateral masses of C1. Mild degenerative change of the atlantodental articulation. Normal atlantoaxial articulations. No fracture or static subluxation of the cervical spine. Cervical vertebral and heights are preserved. Prevertebral soft tissues are normal. Mild multilevel cervical spine DDD, worse at C6-C7 with disc space height loss, endplate irregularity and sclerosis. Calcifications within the bilateral carotid bulbs, right greater than left. Extensive small vessel vascular calcifications about the neck and base of skull. No bulky cervical lymphadenopathy on this noncontrast examination. Normal noncontrast appearance of the thyroid gland. Limited visualization  of lung apices demonstrates rather extensive biapical ill-defined nodular airspace opacities (representative image 105, series 3). Right anterior chest wall pacemaker. IMPRESSION: Head CT Impression: 1. Moderate-sized subdural hematoma about the left frontoparietal lobe measuring approximately 9 mm in diameter and resulting in approximately 3 mm left-to-right midline shift. 2. Small amount of subarachnoid hemorrhage located within several adjacent sulci of the left parietal lobe. No intraventricular extension. 3. Suspected tiny (approximately 2 mm) subdural hematoma about the contralateral right frontal lobe without associated mass effect 4. Minimal soft tissue swelling about the left parietal calvarium without associated fracture radiopaque foreign body. 5. Old infarct involving the posterior subcortical aspect of the right parietal lobe. Cervical spine CT Impression: 1. No fracture static subluxation of cervical spine. 2. Rather extensive nodular airspace opacities within the imaged bilateral lung apices, incompletely evaluated. Further evaluation with nonemergent dedicated chest CT could be performed as clinically indicated. 3. 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 Critical Value/emergent results were called by telephone at the time of interpretation on 01/07/2016 at 4:54 pm to Dr. Jacalyn Lefevre , who verbally acknowledged these results. Electronically Signed   By: Simonne Come M.D.   On: 01/07/2016 17:01   Dg Hip Unilat With Pelvis 2-3 Views Left  01/07/2016  CLINICAL DATA:  Fall, LEFT groin pain EXAM: DG HIP (WITH OR WITHOUT PELVIS) 2-3V LEFT COMPARISON:  None. FINDINGS: There is a fracture of the LEFT superior and inferior pubic rami. These fractures are minimally displaced. Potential extension of fracture line to the LEFT acetabulum. Hips are located. Dedicated view of the LEFT hip demonstrates no femoral neck fracture. IMPRESSION: Minimally displaced fractures of the LEFT  superior and inferior pubic rami. Potential extension to the LEFT acetabulum. Electronically Signed   By: Genevive Bi M.D.   On: 01/07/2016 16:32   I have personally reviewed and evaluated these images and lab results as part of my medical decision-making.   EKG Interpretation   Date/Time:  Tuesday Jan 07 2016 15:41:19 EDT Ventricular Rate:  75 PR Interval:    QRS Duration: 80 QT Interval:  332 QTC Calculation: 371 R Axis:   0 Text Interpretation:  Atrial fibrillation Low voltage, extremity leads  Abnormal R-wave progression,  late transition Borderline repolarization  abnormality agree. Dynamic T wave V3 Confirmed by Donnald Garre, MD, Lebron Conners  [phone]) on 01/07/2016 4:39:38 PM      MDM  PT SPOKE WITH DR. Luisa Hart FROM TRAUMA WHO WILL ADMIT.  I SPOKE WITH DR. POOL FROM NS WHO REQUESTED THE RAPID REVERSAL PROTOCOL WHICH I WILL START.  HE WILL SEE PT.  I SPOKE WITH DR. Wandra Feinstein FROM ORTHO WHO WILL SEE PT.  I SPOKE WITH DR. KRALL FROM MEDICINE WHO WILL SEE PT. Final diagnoses:  Subdural hematoma caused by concussion, without loss of consciousness, initial encounter (HCC)  SAH (subarachnoid hemorrhage) (HCC)  Closed fracture of multiple pubic rami, left, initial encounter (HCC)  Anticoagulated on Coumadin       Jacalyn Lefevre, MD 01/08/16 1330

## 2016-01-07 NOTE — ED Notes (Signed)
Pt returned from xray

## 2016-01-07 NOTE — ED Notes (Signed)
To ED via [facility] from home-- with c/o fall at 0300, EMS was called, pt refuswed transport-- at that time CBG was 56-- has eaten since then. Pt states has stayed in the bed since- pt c/o generalized pain, with worse pain in left groin, left and right toes, states is supposed to have toes amputated. C/o hematoma to left parietal area, neck pain,  Pt received Fentanyl enroute for pain, Hx of kidney transplant 1999 Hx pacemaker- medtronic Hx - A Fib.

## 2016-01-07 NOTE — H&P (Signed)
History   [name] [name] is an 70 y.o. female.   Chief Complaint:  Chief Complaint  Patient presents with  . Fall    Fall Associated symptoms include weakness.  Pt fell out of bed and bumped head on nightstand No LOC called EMS but refused to come in. Laid in bed until this PM and had HA so called EMS and brought to ED.  GCS 15 complains of head ache and pubic pain.  Has painful foot ulcers.  Pt suppose to have surgery for that next week.   Past Medical History  Diagnosis Date  . Hypertension   . Stroke [facility]) December 2011    left MCA distribution  . Permanent atrial fibrillation ([place])     coumadin clinic at [facility]. Has previously required Lovenox->Coumadin bridge.  . Severe mitral stenosis by prior echocardiogram     last echo 07/2012  . Aortic stenosis   . Symptomatic bradycardia     s/p pacer  . Depression   . HSV (herpes simplex virus) infection   . Anemia   . Hyperparathyroidism, primary ([place])     s/p parathyroidectomy  . PAD (peripheral artery disease) ([facility])     a. s/p RLE angioplasty with Dr. Kathlene Cote. b. (by Dr. Fletcher Anon)  L popliteral artery orbital atherectomy and balloon angioplasty & L SFA orbital atherectomy and balloon angioplasty 11/30/12.  [name] PVD (peripheral vascular disease) ([place])   . Presence of permanent cardiac pacemaker   . High cholesterol   . CHF (congestive heart failure) ([place])   . TIA (transient ischemic attack) 08/2009    Archie Endo 09/05/2009  . Atrial thrombus ([place])     appendage/notes 09/05/2009  . Type II diabetes mellitus ([place])   . History of blood transfusion     related to "kidney transplant"  . Chronic kidney disease     s/p renal transplant;  follows with Dr. Edrick Oh  . GERD (gastroesophageal reflux disease)     hx  . History of stomach ulcers   . Diabetic foot ulcer ([place])     "left is worse than right" (11/20/2015)    Past Surgical History  Procedure Laterality Date  . Insert / replace / remove pacemaker    . Kidney  transplant  December 1999  . Abdominal angiogram  11/30/2012    ABDOMINAL AORTIC ANGIOGRAM   DR ARDIA  . Dialysis fistula creation    . Abdominal aortagram N/A 11/30/2012    Procedure: ABDOMINAL Maxcine Ham;  Surgeon: Wellington Hampshire, MD;  Location: [facility] CATH LAB;  Service: Cardiovascular;  Laterality: N/A;  . Peripheral vascular catheterization N/A 11/20/2015    Procedure: Abdominal Aortogram;  Surgeon: Wellington Hampshire, MD;  Location: [place] CV LAB;  Service: Cardiovascular;  Laterality: N/A;  . Peripheral vascular catheterization Bilateral 11/20/2015    Procedure: Lower Extremity Angiography;  Surgeon: Wellington Hampshire, MD;  Location: [place] CV LAB;  Service: Cardiovascular;  Laterality: Bilateral;  . Peripheral vascular catheterization  11/20/2015    Procedure: Peripheral Vascular Balloon Angioplasty;  Surgeon: Wellington Hampshire, MD;  Location: [place] CV LAB;  Service: Cardiovascular;;  . Tonsillectomy    . Appendectomy    . Hernia repair    . Umbilical hernia repair      Family History  Problem Relation Age of Onset  . Adopted: Yes  . Kidney disease Brother     died from  . Kidney disease Sister     died from  . Heart attack Neg Hx   .  Stroke Neg Hx    Social History:  reports that she has quit smoking. Her smoking use included Cigarettes. She has a 2.5 pack-year smoking history. She has never used smokeless tobacco. She reports that she does not drink alcohol or use illicit drugs.  Allergies   Allergies  Allergen Reactions  . Bactrim [Sulfamethoxazole-Trimethoprim] Itching  . Aspirin Nausea Only    GI upset  . Shellfish Allergy Hives    Also nausea  . Codeine Hives and Nausea Only  . Latex Rash  . Penicillins Nausea And Vomiting and Rash    Has patient had a PCN reaction causing immediate rash, facial/tongue/throat swelling, SOB or lightheadedness with hypotension:No Has patient had a PCN reaction causing severe rash involving mucus membranes or skin  necrosis:No Has patient had a PCN reaction that required hospitalization:No Has patient had a PCN reaction occurring within the last 10 years:cannot recall If all of the above answers are "NO", then may proceed with Cephalosporin use.   . Tape Itching and Rash    Please use "paper" tape    Home Medications   (Not in a hospital admission) amLODipine (NORVASC) tablet 5 mg cycloSPORINE modified (NEORAL) capsule 25 mg dextrose 5 %-0.9 % sodium chloride infusion digoxin (LANOXIN) tablet 0.125 mg ergocalciferol (VITAMIN D2) capsule 50,000 Units furosemide (LASIX) tablet 20 mg HYDROmorphone (DILAUDID) injection 0.5 mg hydroxypropyl methylcellulose / hypromellose (ISOPTO TEARS / GONIOVISC) 2.5 % ophthalmic solution 1 drop insulin aspart (novoLOG) injection 0-20 Units metoprolol tartrate (LOPRESSOR) tablet 100 mg mycophenolate (CELLCEPT) capsule 500 mg L1 ondansetron (ZOFRAN) injection 4 mg L1 ondansetron (ZOFRAN) tablet 4 mg phytonadione (VITAMIN K) 10 mg in dextrose 5 % 50 mL IVPB pravastatin (PRAVACHOL) tablet 20 mg predniSONE (DELTASONE) tablet 5 mg prothrombin complex conc human (KCENTRA) IVPB 1,548 Units valACYclovir (VALTREX) tablet 500 mg  Trauma Course   Results for orders placed or performed during the hospital encounter of 01/07/16 (from the past 48 hour(s))  CBC     Status: None   Collection Time: 01/07/16  4:36 PM  Result Value Ref Range   WBC 9.4 4.0 - 10.5 K/uL   RBC 4.11 3.87 - 5.11 MIL/uL   Hemoglobin 13.4 12.0 - 15.0 g/dL   HCT 40.4 36.0 - 46.0 %   MCV 98.3 78.0 - 100.0 fL   MCH 32.6 26.0 - 34.0 pg   MCHC 33.2 30.0 - 36.0 g/dL   RDW 13.7 11.5 - 15.5 %   Platelets 274 150 - 400 K/uL  Comprehensive metabolic panel     Status: Abnormal   Collection Time: 01/07/16  4:36 PM  Result Value Ref Range   Sodium 138 135 - 145 mmol/L   Potassium 4.0 3.5 - 5.1 mmol/L   Chloride 101 101 - 111 mmol/L   CO2 26 22 - 32 mmol/L   Glucose, Bld 89 65 - 99 mg/dL   BUN 17 6 - 20 mg/dL    Creatinine, Ser 0.98 0.44 - 1.00 mg/dL   Calcium 9.6 8.9 - 10.3 mg/dL   Total Protein 6.7 6.5 - 8.1 g/dL   Albumin 3.5 3.5 - 5.0 g/dL   AST 60 (H) 15 - 41 U/L   ALT 43 14 - 54 U/L   Alkaline Phosphatase 99 38 - 126 U/L   Total Bilirubin 1.0 0.3 - 1.2 mg/dL   GFR calc non Af Amer 58 (L) >60 mL/min   GFR calc Af Amer >60 >60 mL/min    Comment: (NOTE) The eGFR has been calculated using the  CKD EPI equation. This calculation has not been validated in all clinical situations. eGFR's persistently <60 mL/min signify possible Chronic Kidney Disease.    Anion gap 11 5 - 15  Protime-INR     Status: Abnormal   Collection Time: 01/07/16  4:36 PM  Result Value Ref Range   Prothrombin Time 42.5 (H) 11.6 - 15.2 seconds   INR 4.65 (H) 0.00 - 1.49  Troponin I     Status: None   Collection Time: 01/07/16  4:36 PM  Result Value Ref Range   Troponin I <0.03 <0.031 ng/mL    Comment:        NO INDICATION OF MYOCARDIAL INJURY.    Dg Chest 2 View  01/07/2016  CLINICAL DATA:  Pain following fall. History of mitral stenosis. Patient also with history of primary hyperparathyroidism EXAM: CHEST  2 VIEW COMPARISON:  November 17, 2015 FINDINGS: Widespread parenchymal calcification is noted, stable. No edema or consolidation. There is cardiomegaly with pulmonary vascularity within normal limits. Pacemaker lead is attached to the right ventricle. No adenopathy is apparent. There is calcification in the aortic arch region. No bone lesions are evident. There are surgical clips in the medial left upper thorax. No pneumothorax. No acute fracture evident. IMPRESSION: Widespread pulmonary parenchymal calcification. This calcification may represent metastatic pulmonary calcification from primary hyperparathyroidism. This calcification also may represent pulmonary ossification secondary to chronic long-standing mitral stenosis. Contribution from both entities is possible. The appearance is stable. There is stable cardiomegaly  with primarily left atrial and right heart enlargement consistent with the chronic mitral stenosis. No edema or consolidation. No pneumothorax or acute fracture. Electronically Signed   By: Lowella Grip III M.D.   On: 01/07/2016 16:32   Dg Ankle Complete Right  01/07/2016  CLINICAL DATA:  Fall from upright position this morning, right ankle pain EXAM: RIGHT ANKLE - COMPLETE 3+ VIEW COMPARISON:  None. FINDINGS: Three views of the right ankle submitted. Atherosclerotic vascular calcifications are noted. Tiny plantar spur of calcaneus. Ankle mortise is preserved. IMPRESSION: No acute fracture or subluxation. Atherosclerotic vascular calcifications are noted. Tiny plantar spur of calcaneus. Electronically Signed   By: Lahoma Crocker M.D.   On: 01/07/2016 16:36   Ct Head Wo Contrast  01/07/2016  CLINICAL DATA:  Post fall hitting left frontal region of head now with headache, dizziness and blurred vision. Headache radiating to the left side of the neck. EXAM: CT HEAD WITHOUT CONTRAST CT CERVICAL SPINE WITHOUT CONTRAST TECHNIQUE: Multidetector CT imaging of the head and cervical spine was performed following the standard protocol without intravenous contrast. Multiplanar CT image reconstructions of the cervical spine were also generated. COMPARISON:  Head CT - 11/17/2015 ; 08/18/2009 FINDINGS: CT HEAD FINDINGS There is a minimal amount of asymmetric soft tissue swelling about the left parietal calvarium (image 22, series 2). This finding is without associated displaced calvarial fracture or radiopaque foreign body. There is a moderate sized crescentic subdural hematoma about primarily the left frontoparietal lobe which measures approximately 0.9 cm in greatest diameter (image 19, series 2). Additionally, there is a small amount of subarachnoid hemorrhage seen within several sulci about the left parietal lobe (Representative image 21, series 2). No intraventricular extension. Minimal regional mass effect with  associated approximately 3 mm of left-to-right midline shift and very minimal effacement of the left lateral ventricle. There is a suspected very tiny (approximately 2 mm) subdural hematoma about the right frontal lobe (representative images 14 and 16, series 2) without associated adjacent mass effect. There  is an old subcortical infarct involving the posterior aspect of the right parietal lobe (is 22, series 2). No CT evidence of superimposed acute large territory infarct. No intraparenchymal or extra-axial mass. No evidence of hydrocephalus. Extensive intracranial vascular calcifications. 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 CT CERVICAL SPINE FINDINGS C1 to the superior endplate of T4 is imaged. There is minimal (approximately 2 mm) of anterolisthesis of C5 upon C6. The bilateral facets are normally aligned. The dens is normally positioned between the lateral masses of C1. Mild degenerative change of the atlantodental articulation. Normal atlantoaxial articulations. No fracture or static subluxation of the cervical spine. Cervical vertebral and heights are preserved. Prevertebral soft tissues are normal. Mild multilevel cervical spine DDD, worse at C6-C7 with disc space height loss, endplate irregularity and sclerosis. Calcifications within the bilateral carotid bulbs, right greater than left. Extensive small vessel vascular calcifications about the neck and base of skull. No bulky cervical lymphadenopathy on this noncontrast examination. Normal noncontrast appearance of the thyroid gland. Limited visualization of lung apices demonstrates rather extensive biapical ill-defined nodular airspace opacities (representative image  105, series 3). Right anterior chest wall pacemaker. IMPRESSION: Head CT Impression: 1. Moderate-sized subdural hematoma about the left frontoparietal lobe measuring approximately 9 mm in diameter and resulting in approximately 3 mm left-to-right midline shift. 2. Small amount of subarachnoid hemorrhage located within several adjacent sulci of the left parietal lobe. No intraventricular extension. 3. Suspected tiny (approximately 2 mm) subdural hematoma about the contralateral right frontal lobe without associated mass effect 4. Minimal soft tissue swelling about the left parietal calvarium without associated fracture radiopaque foreign body. 5. Old infarct involving the posterior subcortical aspect of the right parietal lobe. Cervical spine CT Impression: 1. No fracture static subluxation of cervical spine. 2. Rather extensive nodular airspace opacities within the imaged bilateral lung apices, incompletely evaluated. Further evaluation with nonemergent dedicated chest CT could be performed as clinically indicated. 3. 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 Critical Value/emergent results were called by telephone at the time of interpretation on 01/07/2016 at 4:54 pm to Dr. Isla Pence , who verbally acknowledged these results. Electronically Signed   By: Sandi Mariscal M.D.   On: 01/07/2016 17:01   Ct Cervical Spine Wo Contrast  01/07/2016  CLINICAL DATA:  Post fall hitting left frontal region of head now with headache, dizziness and blurred vision. Headache radiating to the left side of the neck. EXAM: CT HEAD WITHOUT CONTRAST CT CERVICAL SPINE WITHOUT CONTRAST TECHNIQUE: Multidetector CT imaging of the head and cervical spine was performed following the standard protocol without intravenous contrast. Multiplanar CT image reconstructions of the cervical spine were also generated. COMPARISON:  Head CT - 11/17/2015 ; 08/18/2009 FINDINGS: CT HEAD FINDINGS There is a minimal amount of  asymmetric soft tissue swelling about the left parietal calvarium (image 22, series 2). This finding is without associated displaced calvarial fracture or radiopaque foreign body. There is a moderate sized crescentic subdural hematoma about primarily the left frontoparietal lobe which measures approximately 0.9 cm in greatest diameter (image 19, series 2). Additionally, there is a small amount of subarachnoid hemorrhage seen within several sulci about the left parietal lobe (Representative image 21, series 2). No intraventricular extension. Minimal regional mass  effect with associated approximately 3 mm of left-to-right midline shift and very minimal effacement of the left lateral ventricle. There is a suspected very tiny (approximately 2 mm) subdural hematoma about the right frontal lobe (representative images 14 and 16, series 2) without associated adjacent mass effect. There is an old subcortical infarct involving the posterior aspect of the right parietal lobe (is 22, series 2). No CT evidence of superimposed acute large territory infarct. No intraparenchymal or extra-axial mass. No evidence of hydrocephalus. Extensive intracranial vascular calcifications. 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 CT CERVICAL SPINE FINDINGS C1 to the superior endplate of T4 is imaged. There is minimal (approximately 2 mm) of anterolisthesis of C5 upon C6. The bilateral facets are normally aligned. The dens is normally positioned between the lateral masses of C1. Mild degenerative change of the atlantodental articulation. Normal atlantoaxial articulations. No fracture or static subluxation of the cervical spine. Cervical vertebral and heights  are preserved. Prevertebral soft tissues are normal. Mild multilevel cervical spine DDD, worse at C6-C7 with disc space height loss, endplate irregularity and sclerosis. Calcifications within the bilateral carotid bulbs, right greater than left. Extensive small vessel vascular calcifications about the neck and base of skull. No bulky cervical lymphadenopathy on this noncontrast examination. Normal noncontrast appearance of the thyroid gland. Limited visualization of lung apices demonstrates rather extensive biapical ill-defined nodular airspace opacities (representative image 105, series 3). Right anterior chest wall pacemaker. IMPRESSION: Head CT Impression: 1. Moderate-sized subdural hematoma about the left frontoparietal lobe measuring approximately 9 mm in diameter and resulting in approximately 3 mm left-to-right midline shift. 2. Small amount of subarachnoid hemorrhage located within several adjacent sulci of the left parietal lobe. No intraventricular extension. 3. Suspected tiny (approximately 2 mm) subdural hematoma about the contralateral right frontal lobe without associated mass effect 4. Minimal soft tissue swelling about the left parietal calvarium without associated fracture radiopaque foreign body. 5. Old infarct involving the posterior subcortical aspect of the right parietal lobe. Cervical spine CT Impression: 1. No fracture static subluxation of cervical spine. 2. Rather extensive nodular airspace opacities within the imaged bilateral lung apices, incompletely evaluated. Further evaluation with nonemergent dedicated chest CT could be performed as clinically indicated. 3. 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 Critical Value/emergent results were called by telephone at the time of interpretation on 01/07/2016 at 4:54 pm to Dr. Isla Pence , who verbally acknowledged these results. Electronically Signed   By: Sandi Mariscal M.D.   On: 01/07/2016 17:01   Dg Hip Unilat  With Pelvis 2-3 Views Left  01/07/2016  CLINICAL DATA:  Fall, LEFT groin pain EXAM: DG HIP (WITH OR WITHOUT PELVIS) 2-3V LEFT COMPARISON:  None. FINDINGS: There is a fracture of the LEFT superior and inferior pubic rami. These fractures are minimally displaced. Potential extension of fracture line to the LEFT acetabulum. Hips are located. Dedicated view of the LEFT hip demonstrates no femoral neck fracture. IMPRESSION: Minimally displaced fractures of the LEFT superior and inferior pubic rami. Potential extension to the LEFT acetabulum. Electronically Signed   By: Suzy Bouchard M.D.   On: 01/07/2016 16:32    Review of Systems  Respiratory: Negative.   Cardiovascular: Negative.   Gastrointestinal: Negative.   Genitourinary: Negative.  Musculoskeletal: Positive for falls.  Neurological: Positive for dizziness and weakness. Negative for seizures and loss of consciousness.  Endo/Heme/Allergies: Bruises/bleeds easily.  Psychiatric/Behavioral: Positive for memory loss.    Blood pressure 158/87, pulse 58, resp. rate 24, height 5' (1.524 m), weight 41.731 kg (92 lb), SpO2 100 %. Physical Exam  Constitutional: She appears cachectic. She has a sickly appearance.  HENT:  Head: Normocephalic and atraumatic.  Eyes: EOM are normal. Pupils are equal, round, and reactive to light. No scleral icterus.  Neck: Normal range of motion and full passive range of motion without pain. Neck supple. No spinous process tenderness and no muscular tenderness present. No rigidity.  Cardiovascular: A regularly irregular rhythm present.  Murmur heard.  Systolic murmur is present with a grade of 5/6  Pulses:      Radial pulses are 1+ on the right side, and 1+ on the left side.       Femoral pulses are 2+ on the right side, and 2+ on the left side.      Dorsalis pedis pulses are 0 on the right side, and 0 on the left side.       Posterior tibial pulses are 0 on the right side, and 0 on the left side.  A fib     Respiratory:  PACEMAKER LEFT CHEST NT CHEST  NO TENDERNESS   GI: Soft. Bowel sounds are normal.  Genitourinary:  TENDER OVER PUBIS   Neurological: She is alert. No cranial nerve deficit or sensory deficit. GCS eye subscore is 4. GCS verbal subscore is 5. GCS motor subscore is 6.  Skin:  PT HAS FOOT ULCER  WOULD NOT LET ME EXAMINE  Psychiatric: She has a normal mood and affect. Her behavior is normal.     Assessment/Plan SDH on chronic anticoagulation  Dr Trenton Gammon seeing and rapid reversal protocol activated   Left superior and inferior rami fracture  Dr Percell Miller to see  Patient Active Problem List   Diagnosis Date Noted  . SDH (subdural hematoma) ([place]) 01/07/2016  . Type II diabetes mellitus ([place]) 11/23/2015  . Hypoglycemia 11/23/2015  . Critical lower limb ischemia 11/20/2015  . Encounter for therapeutic drug monitoring 10/06/2013  . Colon cancer screening 06/01/2012  . 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 PRIMARY HYPERPARATHYROIDISM 08/06/2010  . ANEMIA 08/06/2010  . DEPRESSION 08/06/2010  . Mitral stenosis 08/06/2010  . Atrial fibrillation ([place]) 08/06/2010  . 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 DIASTOLIC DYSFUNCTION 96/43/8381  . Cerebral artery occlusion with cerebral infarction ([place]) 08/06/2010  . PVD 08/06/2010  . RENAL FAILURE, END STAGE 08/06/2010  . PACEMAKER, PERMANENT 08/06/2010  MEDICAL CONSULT FOR MULTIPLE MEDICAL PROBLEMS  ADMIT TO ICU   NEURO CHECK  SSI   KEEP NPO FOR NOW   Carlen Fils A. 01/07/2016, 5:52 PM   Procedures

## 2016-01-07 NOTE — Progress Notes (Signed)
Reason for Consult: Subdural hematoma Referring Physician: Trauma  [name] [name] is an 70 y.o. female.  HPI: 70 year old female status post fall. Patient with injury to her right side of her body including contusions. Patient's situation complicated by chronic Coumadin use for A. fib. Patient complains of headache. No complaints of weakness, numbness. No history of seizure.  Past Medical History  Diagnosis Date  . Hypertension   . Stroke [facility]) December 2011    left MCA distribution  . Permanent atrial fibrillation (HCC)     coumadin clinic at [facility]. Has previously required Lovenox->Coumadin bridge.  . Severe mitral stenosis by prior echocardiogram     last echo 07/2012  . Aortic stenosis   . Symptomatic bradycardia     s/p pacer  . Depression   . HSV (herpes simplex virus) infection   . Anemia   . Hyperparathyroidism, primary (HCC)     s/p parathyroidectomy  . PAD (peripheral artery disease) (HCC)     a. s/p RLE angioplasty with [name]. b. (by [name])  L popliteral artery orbital atherectomy and balloon angioplasty & L SFA orbital atherectomy and balloon angioplasty 11/30/12.  [name] PVD (peripheral vascular disease) (HCC)   . Presence of permanent cardiac pacemaker   . High cholesterol   . CHF (congestive heart failure) (HCC)   . TIA (transient ischemic attack) 08/2009    [name] 09/05/2009  . Atrial thrombus (HCC)     appendage/notes 09/05/2009  . Type II diabetes mellitus (HCC)   . History of blood transfusion     related to "kidney transplant"  . Chronic kidney disease     s/p renal transplant;  follows with [name]  . GERD (gastroesophageal reflux disease)     hx  . History of stomach ulcers   . Diabetic foot ulcer (HCC)     "left is worse than right" (11/20/2015)    Past Surgical History  Procedure Laterality Date  . Insert / replace / remove pacemaker    . Kidney transplant  December 1999  . Abdominal angiogram  11/30/2012    ABDOMINAL  AORTIC ANGIOGRAM   DR ARDIA  . Dialysis fistula creation    . Abdominal aortagram N/A 11/30/2012    Procedure: ABDOMINAL [name];  Surgeon: [name], MD;  Location[facility] CATH LAB;  Service: Cardiovascular;  Laterality: N/A;  . Peripheral vascular catheterization N/A 11/20/2015    Procedure: Abdominal Aortogram;  Surgeon: [name], MD;  Location: [facility] INVASIVE CV LAB;  Service: Cardiovascular;  Laterality: N/A;  . Peripheral vascular catheterization Bilateral 11/20/2015    Procedure: Lower Extremity Angiography;  Surgeon: [name], MD;  Location: [facility] INVASIVE CV LAB;  Service: Cardiovascular;  Laterality: Bilateral;  . Peripheral vascular catheterization  11/20/2015    Procedure: Peripheral Vascular Balloon Angioplasty;  Surgeon: [name], MD;  Location: [facility] INVASIVE CV LAB;  Service: Cardiovascular;;  . Tonsillectomy    . Appendectomy    . Hernia repair    . Umbilical hernia repair      Family History  Problem Relation Age of Onset  . Adopted: Yes  . Kidney disease Brother     died from  . Kidney disease Sister     died from  . Heart attack Neg Hx   . Stroke Neg Hx     Social History:  reports that she has quit smoking. Her smoking use included Cigarettes. She has a 2.5 pack-year smoking history. She has never used smokeless  tobacco. She reports that she does not drink alcohol or use illicit drugs.  Allergies:  Allergies  Allergen Reactions  . Bactrim [Sulfamethoxazole-Trimethoprim] Itching  . Aspirin Nausea Only    GI upset  . Shellfish Allergy Hives    Also nausea  . Codeine Hives and Nausea Only  . Latex Rash  . Penicillins Nausea And Vomiting and Rash    Has patient had a PCN reaction causing immediate rash, facial/tongue/throat swelling, SOB or lightheadedness with hypotension:No Has patient had a PCN reaction causing severe rash involving mucus membranes or skin necrosis:No Has patient had a PCN reaction that required hospitalization:No Has  patient had a PCN reaction occurring within the last 10 years:cannot recall If all of the above answers are "NO", then may proceed with Cephalosporin use.   . Tape Itching and Rash    Please use "paper" tape    Medications: I have reviewed the patient's current medications.  Results for orders placed or performed during the hospital encounter of 01/07/16 (from the past 48 hour(s))  CBC     Status: None   Collection Time: 01/07/16  4:36 PM  Result Value Ref Range   WBC 9.4 4.0 - 10.5 K/uL   RBC 4.11 3.87 - 5.11 MIL/uL   Hemoglobin 13.4 12.0 - 15.0 g/dL   HCT 16.1 09.6 - 04.5 %   MCV 98.3 78.0 - 100.0 fL   MCH 32.6 26.0 - 34.0 pg   MCHC 33.2 30.0 - 36.0 g/dL   RDW 40.9 81.1 - 91.4 %   Platelets 274 150 - 400 K/uL  Protime-INR     Status: Abnormal   Collection Time: 01/07/16  4:36 PM  Result Value Ref Range   Prothrombin Time 42.5 (H) 11.6 - 15.2 seconds   INR 4.65 (H) 0.00 - 1.49    Dg Chest 2 View  01/07/2016  CLINICAL DATA:  Pain following fall. History of mitral stenosis. Patient also with history of primary hyperparathyroidism EXAM: CHEST  2 VIEW COMPARISON:  November 17, 2015 FINDINGS: Widespread parenchymal calcification is noted, stable. No edema or consolidation. There is cardiomegaly with pulmonary vascularity within normal limits. Pacemaker lead is attached to the right ventricle. No adenopathy is apparent. There is calcification in the aortic arch region. No bone lesions are evident. There are surgical clips in the medial left upper thorax. No pneumothorax. No acute fracture evident. IMPRESSION: Widespread pulmonary parenchymal calcification. This calcification may represent metastatic pulmonary calcification from primary hyperparathyroidism. This calcification also may represent pulmonary ossification secondary to chronic long-standing mitral stenosis. Contribution from both entities is possible. The appearance is stable. There is stable cardiomegaly with primarily left atrial and  right heart enlargement consistent with the chronic mitral stenosis. No edema or consolidation. No pneumothorax or acute fracture. Electronically Signed   By: Bretta Bang III M.D.   On: 01/07/2016 16:32   Dg Ankle Complete Right  01/07/2016  CLINICAL DATA:  Fall from upright position this morning, right ankle pain EXAM: RIGHT ANKLE - COMPLETE 3+ VIEW COMPARISON:  None. FINDINGS: Three views of the right ankle submitted. Atherosclerotic vascular calcifications are noted. Tiny plantar spur of calcaneus. Ankle mortise is preserved. IMPRESSION: No acute fracture or subluxation. Atherosclerotic vascular calcifications are noted. Tiny plantar spur of calcaneus. Electronically Signed   By: Natasha Mead M.D.   On: 01/07/2016 16:36   Ct Head Wo Contrast  01/07/2016  CLINICAL DATA:  Post fall hitting left frontal region of head now with headache, dizziness and blurred vision. Headache  radiating to the left side of the neck. EXAM: CT HEAD WITHOUT CONTRAST CT CERVICAL SPINE WITHOUT CONTRAST TECHNIQUE: Multidetector CT imaging of the head and cervical spine was performed following the standard protocol without intravenous contrast. Multiplanar CT image reconstructions of the cervical spine were also generated. COMPARISON:  Head CT - 11/17/2015 ; 08/18/2009 FINDINGS: CT HEAD FINDINGS There is a minimal amount of asymmetric soft tissue swelling about the left parietal calvarium (image 22, series 2). This finding is without associated displaced calvarial fracture or radiopaque foreign body. There is a moderate sized crescentic subdural hematoma about primarily the left frontoparietal lobe which measures approximately 0.9 cm in greatest diameter (image 19, series 2). Additionally, there is a small amount of subarachnoid hemorrhage seen within several sulci about the left parietal lobe (Representative image 21, series 2). No intraventricular extension. Minimal regional mass effect with associated approximately 3 mm of  left-to-right midline shift and very minimal effacement of the left lateral ventricle. There is a suspected very tiny (approximately 2 mm) subdural hematoma about the right frontal lobe (representative images 14 and 16, series 2) without associated adjacent mass effect. There is an old subcortical infarct involving the posterior aspect of the right parietal lobe (is 22, series 2). No CT evidence of superimposed acute large territory infarct. No intraparenchymal or extra-axial mass. No evidence of hydrocephalus. Extensive intracranial vascular calcifications. 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 CT CERVICAL SPINE FINDINGS C1 to the superior endplate of T4 is imaged. There is minimal (approximately 2 mm) of anterolisthesis of C5 upon C6. The bilateral facets are normally aligned. The dens is normally positioned between the lateral masses of C1. Mild degenerative change of the atlantodental articulation. Normal atlantoaxial articulations. No fracture or static subluxation of the cervical spine. Cervical vertebral and heights are preserved. Prevertebral soft tissues are normal. Mild multilevel cervical spine DDD, worse at C6-C7 with disc space height loss, endplate irregularity and sclerosis. Calcifications within the bilateral carotid bulbs, right greater than left. Extensive small vessel vascular calcifications about the neck and base of skull. No bulky cervical lymphadenopathy on this noncontrast examination. Normal noncontrast appearance of the thyroid gland. Limited visualization of lung apices demonstrates rather extensive biapical ill-defined nodular airspace opacities (representative image 105, series 3). Right anterior  chest wall pacemaker. IMPRESSION: Head CT Impression: 1. Moderate-sized subdural hematoma about the left frontoparietal lobe measuring approximately 9 mm in diameter and resulting in approximately 3 mm left-to-right midline shift. 2. Small amount of subarachnoid hemorrhage located within several adjacent sulci of the left parietal lobe. No intraventricular extension. 3. Suspected tiny (approximately 2 mm) subdural hematoma about the contralateral right frontal lobe without associated mass effect 4. Minimal soft tissue swelling about the left parietal calvarium without associated fracture radiopaque foreign body. 5. Old infarct involving the posterior subcortical aspect of the right parietal lobe. Cervical spine CT Impression: 1. No fracture static subluxation of cervical spine. 2. Rather extensive nodular airspace opacities within the imaged bilateral lung apices, incompletely evaluated. Further evaluation with nonemergent dedicated chest CT could be performed as clinically indicated. 3. 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 Critical Value/emergent results were called by telephone at the time of interpretation on 01/07/2016 at 4:54 pm to Dr. Jacalyn Lefevre ,  who verbally acknowledged these results. Electronically Signed   By: Simonne Come M.D.   On: 01/07/2016 17:01   Ct Cervical Spine Wo Contrast  01/07/2016  CLINICAL DATA:  Post fall hitting left frontal region of head now with headache, dizziness and blurred vision. Headache radiating to the left side of the neck. EXAM: CT HEAD WITHOUT CONTRAST CT CERVICAL SPINE WITHOUT CONTRAST TECHNIQUE: Multidetector CT imaging of the head and cervical spine was performed following the standard protocol without intravenous contrast. Multiplanar CT image reconstructions of the cervical spine were also generated. COMPARISON:  Head CT - 11/17/2015 ; 08/18/2009 FINDINGS: CT HEAD FINDINGS There is a minimal amount of asymmetric soft tissue swelling  about the left parietal calvarium (image 22, series 2). This finding is without associated displaced calvarial fracture or radiopaque foreign body. There is a moderate sized crescentic subdural hematoma about primarily the left frontoparietal lobe which measures approximately 0.9 cm in greatest diameter (image 19, series 2). Additionally, there is a small amount of subarachnoid hemorrhage seen within several sulci about the left parietal lobe (Representative image 21, series 2). No intraventricular extension. Minimal regional mass effect with associated approximately 3 mm of left-to-right midline shift and very minimal effacement of the left lateral ventricle. There is a suspected very tiny (approximately 2 mm) subdural hematoma about the right frontal lobe (representative images 14 and 16, series 2) without associated adjacent mass effect. There is an old subcortical infarct involving the posterior aspect of the right parietal lobe (is 22, series 2). No CT evidence of superimposed acute large territory infarct. No intraparenchymal or extra-axial mass. No evidence of hydrocephalus. Extensive intracranial vascular calcifications. 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 CT CERVICAL SPINE FINDINGS C1 to the superior endplate of T4 is imaged. There is minimal (approximately 2 mm) of anterolisthesis of C5 upon C6. The bilateral facets are normally aligned. The dens is normally positioned between the lateral masses of C1. Mild degenerative change of the atlantodental articulation. Normal atlantoaxial articulations. No fracture or static subluxation of the cervical spine. Cervical vertebral and heights are preserved. Prevertebral soft  tissues are normal. Mild multilevel cervical spine DDD, worse at C6-C7 with disc space height loss, endplate irregularity and sclerosis. Calcifications within the bilateral carotid bulbs, right greater than left. Extensive small vessel vascular calcifications about the neck and base of skull. No bulky cervical lymphadenopathy on this noncontrast examination. Normal noncontrast appearance of the thyroid gland. Limited visualization of lung apices demonstrates rather extensive biapical ill-defined nodular airspace opacities (representative image 105, series 3). Right anterior chest wall pacemaker. IMPRESSION: Head CT Impression: 1. Moderate-sized subdural hematoma about the left frontoparietal lobe measuring approximately 9 mm in diameter and resulting in approximately 3 mm left-to-right midline shift. 2. Small amount of subarachnoid hemorrhage located within several adjacent sulci of the left parietal lobe. No intraventricular extension. 3. Suspected tiny (approximately 2 mm) subdural hematoma about the contralateral right frontal lobe without associated mass effect 4. Minimal soft tissue swelling about the left parietal calvarium without associated fracture radiopaque foreign body. 5. Old infarct involving the posterior subcortical aspect of the right parietal lobe. Cervical spine CT Impression: 1. No fracture static subluxation of cervical spine. 2. Rather extensive nodular airspace opacities within the imaged bilateral lung  apices, incompletely evaluated. Further evaluation with nonemergent dedicated chest CT could be performed as clinically indicated. 3. 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 Critical Value/emergent results were called by telephone at the time of interpretation on 01/07/2016 at 4:54 pm to Dr. Jacalyn Lefevre , who verbally acknowledged these results. Electronically Signed   By: Simonne Come M.D.   On: 01/07/2016 17:01   Dg Hip Unilat With Pelvis 2-3 Views  Left  01/07/2016  CLINICAL DATA:  Fall, LEFT groin pain EXAM: DG HIP (WITH OR WITHOUT PELVIS) 2-3V LEFT COMPARISON:  None. FINDINGS: There is a fracture of the LEFT superior and inferior pubic rami. These fractures are minimally displaced. Potential extension of fracture line to the LEFT acetabulum. Hips are located. Dedicated view of the LEFT hip demonstrates no femoral neck fracture. IMPRESSION: Minimally displaced fractures of the LEFT superior and inferior pubic rami. Potential extension to the LEFT acetabulum. Electronically Signed   By: Genevive Bi M.D.   On: 01/07/2016 16:32    Pertinent items are noted in HPI. Blood pressure 158/87, pulse 58, resp. rate 24, height 5' (1.524 m), weight 41.731 kg (92 lb), SpO2 100 %. The patient is awake and alert. She is oriented and appropriate. Her speech is fluent. Her judgment and insight are intact. Her cranial nerve function is intact bilaterally. Motor 5/5 bilaterally with no pronator drift. Chest and abdomen benign. Extremities free from injury deformity. Examination head ears eyes and throat demonstrates no evidence of significant bony injury. Oropharynx nasopharynx and external cautery canals clear.  Assessment/Plan: Acute left convexity subdural hematoma with mild mass effect. Patient needs to have her Coumadin reversed ASAP. ICU admission. Follow-up head CT scan in morning.  Gerard Cantara A 01/07/2016, 5:43 PM

## 2016-01-08 ENCOUNTER — Inpatient Hospital Stay (HOSPITAL_COMMUNITY): Payer: Medicare Other

## 2016-01-08 DIAGNOSIS — L97519 Non-pressure chronic ulcer of other part of right foot with unspecified severity: Secondary | ICD-10-CM | POA: Insufficient documentation

## 2016-01-08 DIAGNOSIS — I130 Hypertensive heart and chronic kidney disease with heart failure and stage 1 through stage 4 chronic kidney disease, or unspecified chronic kidney disease: Secondary | ICD-10-CM

## 2016-01-08 DIAGNOSIS — E1122 Type 2 diabetes mellitus with diabetic chronic kidney disease: Secondary | ICD-10-CM

## 2016-01-08 DIAGNOSIS — R918 Other nonspecific abnormal finding of lung field: Secondary | ICD-10-CM

## 2016-01-08 DIAGNOSIS — Z950 Presence of cardiac pacemaker: Secondary | ICD-10-CM

## 2016-01-08 DIAGNOSIS — S065XAA Traumatic subdural hemorrhage with loss of consciousness status unknown, initial encounter: Secondary | ICD-10-CM | POA: Insufficient documentation

## 2016-01-08 DIAGNOSIS — L97529 Non-pressure chronic ulcer of other part of left foot with unspecified severity: Secondary | ICD-10-CM

## 2016-01-08 DIAGNOSIS — N189 Chronic kidney disease, unspecified: Secondary | ICD-10-CM

## 2016-01-08 DIAGNOSIS — I482 Chronic atrial fibrillation, unspecified: Secondary | ICD-10-CM | POA: Insufficient documentation

## 2016-01-08 DIAGNOSIS — E1152 Type 2 diabetes mellitus with diabetic peripheral angiopathy with gangrene: Secondary | ICD-10-CM

## 2016-01-08 DIAGNOSIS — I6201 Nontraumatic acute subdural hemorrhage: Secondary | ICD-10-CM

## 2016-01-08 DIAGNOSIS — Y92003 Bedroom of unspecified non-institutional (private) residence as the place of occurrence of the external cause: Secondary | ICD-10-CM

## 2016-01-08 DIAGNOSIS — S32592A Other specified fracture of left pubis, initial encounter for closed fracture: Secondary | ICD-10-CM

## 2016-01-08 DIAGNOSIS — Z940 Kidney transplant status: Secondary | ICD-10-CM

## 2016-01-08 DIAGNOSIS — Z794 Long term (current) use of insulin: Secondary | ICD-10-CM

## 2016-01-08 DIAGNOSIS — S065X9A Traumatic subdural hemorrhage with loss of consciousness of unspecified duration, initial encounter: Secondary | ICD-10-CM | POA: Insufficient documentation

## 2016-01-08 DIAGNOSIS — I739 Peripheral vascular disease, unspecified: Secondary | ICD-10-CM | POA: Insufficient documentation

## 2016-01-08 DIAGNOSIS — W1830XA Fall on same level, unspecified, initial encounter: Secondary | ICD-10-CM

## 2016-01-08 DIAGNOSIS — Z7901 Long term (current) use of anticoagulants: Secondary | ICD-10-CM | POA: Insufficient documentation

## 2016-01-08 DIAGNOSIS — I509 Heart failure, unspecified: Secondary | ICD-10-CM

## 2016-01-08 DIAGNOSIS — Z87891 Personal history of nicotine dependence: Secondary | ICD-10-CM

## 2016-01-08 LAB — BASIC METABOLIC PANEL
Anion gap: 13 (ref 5–15)
BUN: 18 mg/dL (ref 6–20)
CHLORIDE: 106 mmol/L (ref 101–111)
CO2: 19 mmol/L — AB (ref 22–32)
CREATININE: 1.38 mg/dL — AB (ref 0.44–1.00)
Calcium: 8.6 mg/dL — ABNORMAL LOW (ref 8.9–10.3)
GFR calc non Af Amer: 39 mL/min — ABNORMAL LOW (ref >60)
GFR, EST AFRICAN AMERICAN: 45 mL/min — AB (ref >60)
Glucose, Bld: 268 mg/dL — ABNORMAL HIGH (ref 65–99)
POTASSIUM: 3.9 mmol/L (ref 3.5–5.1)
SODIUM: 138 mmol/L (ref 135–145)

## 2016-01-08 LAB — GLUCOSE, CAPILLARY
GLUCOSE-CAPILLARY: 225 mg/dL — AB (ref 65–99)
GLUCOSE-CAPILLARY: 187 mg/dL — AB (ref 65–99)
GLUCOSE-CAPILLARY: 115 mg/dL — AB (ref 65–99)
GLUCOSE-CAPILLARY: 239 mg/dL — AB (ref 65–99)
Glucose-Capillary: 197 mg/dL — ABNORMAL HIGH (ref 65–99)
Glucose-Capillary: 246 mg/dL — ABNORMAL HIGH (ref 65–99)

## 2016-01-08 LAB — PROTIME-INR
INR: 1.21 (ref 0.00–1.49)
INR: 1.18 (ref 0.00–1.49)
INR: 1.21 (ref 0.00–1.49)
PROTHROMBIN TIME: 15.4 s — AB (ref 11.6–15.2)
PROTHROMBIN TIME: 15.5 s — AB (ref 11.6–15.2)
Prothrombin Time: 15.2 seconds (ref 11.6–15.2)

## 2016-01-08 LAB — MAGNESIUM: MAGNESIUM: 1.4 mg/dL — AB (ref 1.7–2.4)

## 2016-01-08 MED ORDER — LABETALOL HCL 5 MG/ML IV SOLN
10.0000 mg | INTRAVENOUS | Status: DC | PRN
  Administered 2016-01-08 – 2016-01-09 (×2): 10 mg via INTRAVENOUS
  Filled 2016-01-08 (×3): qty 4

## 2016-01-08 MED ORDER — DEXAMETHASONE SODIUM PHOSPHATE 4 MG/ML IJ SOLN
4.0000 mg | Freq: Four times a day (QID) | INTRAMUSCULAR | Status: DC
  Administered 2016-01-08 – 2016-01-13 (×24): 4 mg via INTRAVENOUS
  Filled 2016-01-08 (×24): qty 1

## 2016-01-08 MED ORDER — LABETALOL HCL 5 MG/ML IV SOLN
10.0000 mg | Freq: Once | INTRAVENOUS | Status: DC
  Administered 2016-01-08: 10 mg via INTRAVENOUS

## 2016-01-08 MED ORDER — CETYLPYRIDINIUM CHLORIDE 0.05 % MT LIQD
7.0000 mL | Freq: Two times a day (BID) | OROMUCOSAL | Status: DC
  Administered 2016-01-09 – 2016-01-13 (×9): 7 mL via OROMUCOSAL

## 2016-01-08 MED ORDER — CHLORHEXIDINE GLUCONATE 0.12 % MT SOLN
15.0000 mL | Freq: Two times a day (BID) | OROMUCOSAL | Status: DC
  Administered 2016-01-08 – 2016-01-16 (×16): 15 mL via OROMUCOSAL
  Filled 2016-01-08 (×12): qty 15

## 2016-01-08 MED ORDER — LABETALOL HCL 5 MG/ML IV SOLN
INTRAVENOUS | Status: AC
  Filled 2016-01-08: qty 4

## 2016-01-08 NOTE — Progress Notes (Signed)
Dr. Jordan Likes notified of worsening aphasia. No new orders at this time. Will continue to monitor closely.

## 2016-01-08 NOTE — Progress Notes (Signed)
The patient is having increasing difficulty with word finding and language. She reports mild headache. She is wide-awake and has no other complaints currently.  On exam she is awake and alert. She is reasonably oriented. Her speech is halting with word finding difficulties. Motor 5/5 bilaterally with significant tremor but no evidence of hemiparesis.  Follow-up head CT scan last night demonstrate some slight worsening of her left subdural hematoma but still with motor minimal mass effect.  Small left convexity subdural hematoma. Patient with some element of aphasia but overall I do not think that craniotomy and evacuation of clot is likely to improve her situation. We will continue to observe it present. Plan on getting follow-up scan tomorrow.

## 2016-01-08 NOTE — Progress Notes (Signed)
Internal Medicine Daily Consult Note   Date: 01/08/2016               Patient Name:  [name] [name] MRN: [id number]  DOB: 16-Sep-1945 Age / Sex: 70 y.o., female   PCP: Marva Panda, NP         Requesting Physician: Dr. Minerva Fester Md, MD    Consulting Reason:  Management of multiple medical comorbidities     Chief Complaint: Subdural hematoma, pubic ramus fracture  Subjective:  Overnight, pt noted to have increased hypertension to 170/120 with HR 90s-130s. EKG showing afib with RVR- likely physiological response. Pt had word finding difficulties. Repeat CT showed increased in size of subdural hematoma. Had ordered labetalol PRN. Decadron was added to decrease intracranial pressure by neurosurgery.  This morning, pt was sleepy but arousable to command.  Complaining of pain in the foot.   Meds: Current Facility-Administered Medications  Medication Dose Route Frequency Provider Last Rate Last Dose  . amLODipine (NORVASC) tablet 5 mg  5 mg Oral Daily Harriette Bouillon, MD      . cycloSPORINE modified (NEORAL) capsule 25 mg  25 mg Oral BID Harriette Bouillon, MD   25 mg at 01/07/16 2355  . dexamethasone (DECADRON) injection 4 mg  4 mg Intravenous Q6H Julio Sicks, MD   4 mg at 01/08/16 1224  . dextrose 5 %-0.9 % sodium chloride infusion   Intravenous Continuous Harriette Bouillon, MD 75 mL/hr at 01/08/16 0859 75 mL/hr at 01/08/16 0859  . digoxin (LANOXIN) tablet 0.125 mg  0.125 mg Oral Daily Harriette Bouillon, MD      . furosemide (LASIX) tablet 20 mg  20 mg Oral QODAY Thomas Cornett, MD      . HYDROmorphone (DILAUDID) injection 0.5 mg  0.5 mg Intravenous Q2H PRN Harriette Bouillon, MD   0.5 mg at 01/08/16 0901  . hydroxypropyl methylcellulose / hypromellose (ISOPTO TEARS / GONIOVISC) 2.5 % ophthalmic solution 1 drop  1 drop Both Eyes QID PRN Harriette Bouillon, MD      . insulin aspart (novoLOG) injection 0-20 Units  0-20 Units Subcutaneous Q4H Harriette Bouillon, MD   4 Units at 01/08/16 1224  . labetalol  (NORMODYNE,TRANDATE) 5 MG/ML injection           . labetalol (NORMODYNE,TRANDATE) injection 10 mg  10 mg Intravenous Q10 min PRN Fuller Plan, MD   10 mg at 01/08/16 0854  . metoprolol tartrate (LOPRESSOR) tablet 100 mg  100 mg Oral BID Harriette Bouillon, MD   100 mg at 01/07/16 2355  . mycophenolate (CELLCEPT) capsule 500 mg  500 mg Oral BID Harriette Bouillon, MD   500 mg at 01/07/16 2355  . ondansetron (ZOFRAN) tablet 4 mg  4 mg Oral Q6H PRN Harriette Bouillon, MD       Or  . ondansetron (ZOFRAN) injection 4 mg  4 mg Intravenous Q6H PRN Harriette Bouillon, MD      . pravastatin (PRAVACHOL) tablet 20 mg  20 mg Oral QPM Harriette Bouillon, MD      . valACYclovir (VALTREX) tablet 500 mg  500 mg Oral Q M,W,F Harriette Bouillon, MD      . Melene Muller ON 01/20/2016] Vitamin D (Ergocalciferol) (DRISDOL) capsule 50,000 Units  50,000 Units Oral Q30 days Harriette Bouillon, MD        Allergies: Allergies as of 01/07/2016 - Review Complete 01/07/2016  Allergen Reaction Noted  . Bactrim [sulfamethoxazole-trimethoprim] Itching 05/17/2012  . Aspirin Nausea Only 10/29/2015  . Shellfish allergy Hives 11/20/2015  . Codeine  Hives and Nausea Only   . Latex Rash 06/19/2013  . Penicillins Nausea And Vomiting and Rash 01/06/2012  . Tape Itching and Rash 11/19/2015   Past Medical History  Diagnosis Date  . Hypertension   . Stroke [facility]) December 2011    left MCA distribution  . Permanent atrial fibrillation (HCC)     coumadin clinic at [facility]. Has previously required Lovenox->Coumadin bridge.  . Severe mitral stenosis by prior echocardiogram     last echo 07/2012  . Aortic stenosis   . Symptomatic bradycardia     s/p pacer  . Depression   . HSV (herpes simplex virus) infection   . Anemia   . Hyperparathyroidism, primary (HCC)     s/p parathyroidectomy  . PAD (peripheral artery disease) (HCC)     a. s/p RLE angioplasty with [name]. b. (by [name])  L popliteral artery orbital atherectomy and balloon  angioplasty & L SFA orbital atherectomy and balloon angioplasty 11/30/12.  [name] PVD (peripheral vascular disease) (HCC)   . Presence of permanent cardiac pacemaker   . High cholesterol   . CHF (congestive heart failure) (HCC)   . TIA (transient ischemic attack) 08/2009    [name] 09/05/2009  . Atrial thrombus (HCC)     appendage/notes 09/05/2009  . Type II diabetes mellitus (HCC)   . History of blood transfusion     related to "kidney transplant"  . Chronic kidney disease     s/p renal transplant;  follows with [name]  . GERD (gastroesophageal reflux disease)     hx  . History of stomach ulcers   . Diabetic foot ulcer (HCC)     "left is worse than right" (11/20/2015)   Past Surgical History  Procedure Laterality Date  . Insert / replace / remove pacemaker    . Kidney transplant  December 1999  . Abdominal angiogram  11/30/2012    ABDOMINAL AORTIC ANGIOGRAM   DR ARDIA  . Dialysis fistula creation    . Abdominal aortagram N/A 11/30/2012    Procedure: ABDOMINAL [name];  Surgeon: [name], MD;  Location[facility] C[facility];  Service: Cardiovascular;  Laterality: N/A;  . Peripheral vascular catheterization N/A 11/20/2015    Procedure: Abdominal Aortogram;  Surgeon: [name], MD;  Location: [facility] INVASIVE CV LAB;  Service: Cardiovascular;  Laterality: N/A;  . Peripheral vascular catheterization Bilateral 11/20/2015    Procedure: Lower Extremity Angiography;  Surgeon: [name], MD;  Location: [facility] INVASIVE CV LAB;  Service: Cardiovascular;  Laterality: Bilateral;  . Peripheral vascular catheterization  11/20/2015    Procedure: Peripheral Vascular Balloon Angioplasty;  Surgeon: [name], MD;  Location: [facility] INVASIVE CV LAB;  Service: Cardiovascular;;  . Tonsillectomy    . Appendectomy    . Hernia repair    . Umbilical hernia repair     Family History  Problem Relation Age of Onset  . Adopted: Yes  . Kidney disease Brother     died from  . Kidney disease Sister      died from  . Heart attack Neg Hx   . Stroke Neg Hx    Social History   Social History  . Marital Status: Legally Separated    Spouse Name: N/A  . Number of Children: N/A  . Years of Education: N/A   Occupational History  . Not on file.   Social History Main Topics  . Smoking status: Former Smoker -- 0.25 packs/day for 10 years  Types: Cigarettes  . Smokeless tobacco: Never Used     Comment: "quit smoking cigarettes in the 1990s"  . Alcohol Use: No  . Drug Use: No  . Sexual Activity: No   Other Topics Concern  . Not on file   Social History Narrative    Physical Exam: Blood pressure 106/42, pulse 93, temperature 98.2 F (36.8 C), temperature source Oral, resp. rate 19, height 5\' 2"  (1.575 m), weight 79 lb 12.9 oz (36.2 kg), SpO2 98 %.  General: Vital signs reviewed. Patient sleepy, endorsed expressive aphasia, was in considerable pain when examining the foot ulcer  HEENT: EOMI, arcus senilis, PERRL Cardiovascular: 3/6 systolic murmur, regular rate Pulmonary/Chest: Clear to auscultation bilaterally, no wheezes, rales, or rhonchi. Abdominal: Soft, non-tender, non-distended, BS + Extremities: No lower extremity edema bilaterally, could not appreciate DP and PT pulses, had ischemic ulcers 0.5 cm b/l  Skin: Warm, dry and intact. No rashes or erythema.   Lab results: Basic Metabolic Panel:  Recent Labs  16/10/96 1636 01/07/16 2145  NA 138 135  K 4.0 3.7  CL 101 99*  CO2 26 25  GLUCOSE 89 127*  BUN 17 14  CREATININE 0.98 0.94  CALCIUM 9.6 9.2   Liver Function Tests:  Recent Labs  01/07/16 1636 01/07/16 2145  AST 60* 53*  ALT 43 39  ALKPHOS 99 100  BILITOT 1.0 1.0  PROT 6.7 6.4*  ALBUMIN 3.5 3.1*   No results for input(s): LIPASE, AMYLASE in the last 72 hours. No results for input(s): AMMONIA in the last 72 hours. CBC:  Recent Labs  01/07/16 1636 01/07/16 2145  WBC 9.4 8.7  HGB 13.4 13.9  HCT 40.4 41.7  MCV 98.3 98.1  PLT 274 248    Cardiac Enzymes:  Recent Labs  01/07/16 1636  TROPONINI <0.03   BNP: No results for input(s): PROBNP in the last 72 hours. D-Dimer: No results for input(s): DDIMER in the last 72 hours. CBG:  Recent Labs  01/07/16 1812 01/07/16 1934 01/07/16 2311 01/08/16 0322 01/08/16 0748 01/08/16 1125  GLUCAP 119* 188* 94 197* 225* 187*   Hemoglobin A1C: No results for input(s): HGBA1C in the last 72 hours. Fasting Lipid Panel: No results for input(s): CHOL, HDL, LDLCALC, TRIG, CHOLHDL, LDLDIRECT in the last 72 hours. Thyroid Function Tests: No results for input(s): TSH, T4TOTAL, FREET4, T3FREE, THYROIDAB in the last 72 hours. Anemia Panel: No results for input(s): VITAMINB12, FOLATE, FERRITIN, TIBC, IRON, RETICCTPCT in the last 72 hours. Coagulation:  Recent Labs  01/08/16 0315 01/08/16 0809  LABPROT 15.4* 15.2  INR 1.21 1.18   Urine Drug Screen: Drugs of Abuse  No results found for: LABOPIA, COCAINSCRNUR, LABBENZ, AMPHETMU, THCU, LABBARB  Alcohol Level: No results for input(s): ETH in the last 72 hours. Urinalysis: No results for input(s): COLORURINE, LABSPEC, PHURINE, GLUCOSEU, HGBUR, BILIRUBINUR, KETONESUR, PROTEINUR, UROBILINOGEN, NITRITE, LEUKOCYTESUR in the last 72 hours.  Invalid input(s): APPERANCEUR   Imaging results:  Dg Chest 2 View  01/07/2016  CLINICAL DATA:  Pain following fall. History of mitral stenosis. Patient also with history of primary hyperparathyroidism EXAM: CHEST  2 VIEW COMPARISON:  November 17, 2015 FINDINGS: Widespread parenchymal calcification is noted, stable. No edema or consolidation. There is cardiomegaly with pulmonary vascularity within normal limits. Pacemaker lead is attached to the right ventricle. No adenopathy is apparent. There is calcification in the aortic arch region. No bone lesions are evident. There are surgical clips in the medial left upper thorax. No pneumothorax. No acute fracture evident.  IMPRESSION: Widespread pulmonary  parenchymal calcification. This calcification may represent metastatic pulmonary calcification from primary hyperparathyroidism. This calcification also may represent pulmonary ossification secondary to chronic long-standing mitral stenosis. Contribution from both entities is possible. The appearance is stable. There is stable cardiomegaly with primarily left atrial and right heart enlargement consistent with the chronic mitral stenosis. No edema or consolidation. No pneumothorax or acute fracture. Electronically Signed   By: Bretta Bang III M.D.   On: 01/07/2016 16:32   Dg Ankle Complete Right  01/07/2016  CLINICAL DATA:  Fall from upright position this morning, right ankle pain EXAM: RIGHT ANKLE - COMPLETE 3+ VIEW COMPARISON:  None. FINDINGS: Three views of the right ankle submitted. Atherosclerotic vascular calcifications are noted. Tiny plantar spur of calcaneus. Ankle mortise is preserved. IMPRESSION: No acute fracture or subluxation. Atherosclerotic vascular calcifications are noted. Tiny plantar spur of calcaneus. Electronically Signed   By: Natasha Mead M.D.   On: 01/07/2016 16:36   Ct Head Wo Contrast  01/07/2016  CLINICAL DATA:  Initial evaluation for acute mental status change from earlier. EXAM: CT HEAD WITHOUT CONTRAST TECHNIQUE: Contiguous axial images were obtained from the base of the skull through the vertex without intravenous contrast. COMPARISON:  Prior CT performed earlier the same day. FINDINGS: Previously identified acute left holo hemispheric subdural hematoma appears increased in size, now measuring up to 10 mm at the level of the left frontal convexity, a 9 mm at the level of the left parietal convexity. Associated mild mass effect on the subjacent left cerebral hemisphere with up to 4 mm of right-to-left shift. Tiny subdural hematoma overlying the right frontal convexity not significantly changed, measuring up to 3 mm without significant mass effect. Scattered acute subarachnoid  hemorrhage within the left frontal parietal region has increased, with a more confluent area of hemorrhage within the left frontal lobe now measuring 14 x 25 mm. Mild edema surrounding this area of hemorrhage which may in part reflect parenchymal hematoma as well. Small amount of subarachnoid hemorrhage within the left sylvian fissure, similar to prior. Probable additional subarachnoid hemorrhage ventral to the upper cervical spinal cord just below the foramen magnum (series 2, image 1). No acute large vessel territory infarct. Remote posterior reader right MCA territory infarct again noted. Scattered vascular calcifications within the carotid siphons and distal vertebral arteries. Extensive vascular calcifications also noted within the extracranial vasculature of the head and upper neck. No hydrocephalus or evidence for ventricular trapping. Basilar cisterns remain patent. Evolving left frontoparietal scalp contusion. No acute abnormality about the orbits. Polypoid mucosal thickening within the left maxillary sinus. Paranasal sinuses are otherwise clear. No mastoid effusion. Calvarium intact. Visualized bones demonstrate a somewhat mottled appearance, may be related to underlying renal osteodystrophy given history of underlying chronic kidney disease. Dural calcifications noted. IMPRESSION: 1. Interval increase in size of holo hemispheric left subdural hematoma, now measuring up to 10 mm. Associated 4 mm of left-to-right shift. No hydrocephalus or evidence of ventricular trapping. 2. Interval increase in left cerebral subarachnoid hemorrhage. Probable small amount of acute subarachnoid hemorrhage ventral to the upper cervical spinal cord as well, similar. 3. Similar size and appearance of tiny acute subdural hematoma overlying the right frontal convexity without significant mass effect. Electronically Signed   By: Rise Mu M.D.   On: 01/07/2016 23:19   Ct Head Wo Contrast  01/07/2016  CLINICAL DATA:   Post fall hitting left frontal region of head now with headache, dizziness and blurred vision. Headache radiating to the left  side of the neck. EXAM: CT HEAD WITHOUT CONTRAST CT CERVICAL SPINE WITHOUT CONTRAST TECHNIQUE: Multidetector CT imaging of the head and cervical spine was performed following the standard protocol without intravenous contrast. Multiplanar CT image reconstructions of the cervical spine were also generated. COMPARISON:  Head CT - 11/17/2015 ; 08/18/2009 FINDINGS: CT HEAD FINDINGS There is a minimal amount of asymmetric soft tissue swelling about the left parietal calvarium (image 22, series 2). This finding is without associated displaced calvarial fracture or radiopaque foreign body. There is a moderate sized crescentic subdural hematoma about primarily the left frontoparietal lobe which measures approximately 0.9 cm in greatest diameter (image 19, series 2). Additionally, there is a small amount of subarachnoid hemorrhage seen within several sulci about the left parietal lobe (Representative image 21, series 2). No intraventricular extension. Minimal regional mass effect with associated approximately 3 mm of left-to-right midline shift and very minimal effacement of the left lateral ventricle. There is a suspected very tiny (approximately 2 mm) subdural hematoma about the right frontal lobe (representative images 14 and 16, series 2) without associated adjacent mass effect. There is an old subcortical infarct involving the posterior aspect of the right parietal lobe (is 22, series 2). No CT evidence of superimposed acute large territory infarct. No intraparenchymal or extra-axial mass. No evidence of hydrocephalus. Extensive intracranial vascular calcifications. Additionally, there is exuberant vascular calcifications about the small vessels of the scalp as well as the tentorium and about the dura with mottled mixed lytic and sclerotic appearance of the calvarium, unchanged and likely  compatible with renal osteodystrophy in the setting of a patient with known prior renal transplantation. CT CERVICAL SPINE FINDINGS C1 to the superior endplate of T4 is imaged. There is minimal (approximately 2 mm) of anterolisthesis of C5 upon C6. The bilateral facets are normally aligned. The dens is normally positioned between the lateral masses of C1. Mild degenerative change of the atlantodental articulation. Normal atlantoaxial articulations. No fracture or static subluxation of the cervical spine. Cervical vertebral and heights are preserved. Prevertebral soft tissues are normal. Mild multilevel cervical spine DDD, worse at C6-C7 with disc space height loss, endplate irregularity and sclerosis. Calcifications within the bilateral carotid bulbs, right greater than left. Extensive small vessel vascular calcifications about the neck and base of skull. No bulky cervical lymphadenopathy on this noncontrast examination. Normal noncontrast appearance of the thyroid gland. Limited visualization of lung apices demonstrates rather extensive biapical ill-defined nodular airspace opacities (representative image 105, series 3). Right anterior chest wall pacemaker. IMPRESSION: Head CT Impression: 1. Moderate-sized subdural hematoma about the left frontoparietal lobe measuring approximately 9 mm in diameter and resulting in approximately 3 mm left-to-right midline shift. 2. Small amount of subarachnoid hemorrhage located within several adjacent sulci of the left parietal lobe. No intraventricular extension. 3. Suspected tiny (approximately 2 mm) subdural hematoma about the contralateral right frontal lobe without associated mass effect 4. Minimal soft tissue swelling about the left parietal calvarium without associated fracture radiopaque foreign body. 5. Old infarct involving the posterior subcortical aspect of the right parietal lobe. Cervical spine CT Impression: 1. No fracture static subluxation of cervical spine. 2.  Rather extensive nodular airspace opacities within the imaged bilateral lung apices, incompletely evaluated. Further evaluation with nonemergent dedicated chest CT could be performed as clinically indicated. 3. 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 Critical Value/emergent results were called by telephone at the time of interpretation on 01/07/2016 at 4:54 pm to Dr. Jacalyn Lefevre , who verbally acknowledged these  results. Electronically Signed   By: Simonne Come M.D.   On: 01/07/2016 17:01   Ct Cervical Spine Wo Contrast  01/07/2016  CLINICAL DATA:  Post fall hitting left frontal region of head now with headache, dizziness and blurred vision. Headache radiating to the left side of the neck. EXAM: CT HEAD WITHOUT CONTRAST CT CERVICAL SPINE WITHOUT CONTRAST TECHNIQUE: Multidetector CT imaging of the head and cervical spine was performed following the standard protocol without intravenous contrast. Multiplanar CT image reconstructions of the cervical spine were also generated. COMPARISON:  Head CT - 11/17/2015 ; 08/18/2009 FINDINGS: CT HEAD FINDINGS There is a minimal amount of asymmetric soft tissue swelling about the left parietal calvarium (image 22, series 2). This finding is without associated displaced calvarial fracture or radiopaque foreign body. There is a moderate sized crescentic subdural hematoma about primarily the left frontoparietal lobe which measures approximately 0.9 cm in greatest diameter (image 19, series 2). Additionally, there is a small amount of subarachnoid hemorrhage seen within several sulci about the left parietal lobe (Representative image 21, series 2). No intraventricular extension. Minimal regional mass effect with associated approximately 3 mm of left-to-right midline shift and very minimal effacement of the left lateral ventricle. There is a suspected very tiny (approximately 2 mm) subdural hematoma about the right frontal lobe (representative images 14  and 16, series 2) without associated adjacent mass effect. There is an old subcortical infarct involving the posterior aspect of the right parietal lobe (is 22, series 2). No CT evidence of superimposed acute large territory infarct. No intraparenchymal or extra-axial mass. No evidence of hydrocephalus. Extensive intracranial vascular calcifications. 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 CT CERVICAL SPINE FINDINGS C1 to the superior endplate of T4 is imaged. There is minimal (approximately 2 mm) of anterolisthesis of C5 upon C6. The bilateral facets are normally aligned. The dens is normally positioned between the lateral masses of C1. Mild degenerative change of the atlantodental articulation. Normal atlantoaxial articulations. No fracture or static subluxation of the cervical spine. Cervical vertebral and heights are preserved. Prevertebral soft tissues are normal. Mild multilevel cervical spine DDD, worse at C6-C7 with disc space height loss, endplate irregularity and sclerosis. Calcifications within the bilateral carotid bulbs, right greater than left. Extensive small vessel vascular calcifications about the neck and base of skull. No bulky cervical lymphadenopathy on this noncontrast examination. Normal noncontrast appearance of the thyroid gland. Limited visualization of lung apices demonstrates rather extensive biapical ill-defined nodular airspace opacities (representative image 105, series 3). Right anterior chest wall pacemaker. IMPRESSION: Head CT Impression: 1. Moderate-sized subdural hematoma about the left frontoparietal lobe measuring approximately 9 mm in diameter and resulting in approximately 3 mm  left-to-right midline shift. 2. Small amount of subarachnoid hemorrhage located within several adjacent sulci of the left parietal lobe. No intraventricular extension. 3. Suspected tiny (approximately 2 mm) subdural hematoma about the contralateral right frontal lobe without associated mass effect 4. Minimal soft tissue swelling about the left parietal calvarium without associated fracture radiopaque foreign body. 5. Old infarct involving the posterior subcortical aspect of the right parietal lobe. Cervical spine CT Impression: 1. No fracture static subluxation of cervical spine. 2. Rather extensive nodular airspace opacities within the imaged bilateral lung apices, incompletely evaluated. Further  evaluation with nonemergent dedicated chest CT could be performed as clinically indicated. 3. 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 Critical Value/emergent results were called by telephone at the time of interpretation on 01/07/2016 at 4:54 pm to Dr. Jacalyn Lefevre , who verbally acknowledged these results. Electronically Signed   By: Simonne Come M.D.   On: 01/07/2016 17:01   Dg Hip Unilat With Pelvis 2-3 Views Left  01/07/2016  CLINICAL DATA:  Fall, LEFT groin pain EXAM: DG HIP (WITH OR WITHOUT PELVIS) 2-3V LEFT COMPARISON:  None. FINDINGS: There is a fracture of the LEFT superior and inferior pubic rami. These fractures are minimally displaced. Potential extension of fracture line to the LEFT acetabulum. Hips are located. Dedicated view of the LEFT hip demonstrates no femoral neck fracture. IMPRESSION: Minimally displaced fractures of the LEFT superior and inferior pubic rami. Potential extension to the LEFT acetabulum. Electronically Signed   By: Genevive Bi M.D.   On: 01/07/2016 16:32    Other results: EKG: normal EKG, normal sinus rhythm, unchanged from previous tracings, atrial fibrillation, rate 75bpm, low voltage recording.  Assessment, Plan, & Recommendations by  Problem:   Acute subdural hematoma in a patient with chronic Coumadin use- supratherapeutic with INR around 4.65.- INR rapidly reversed   -neurosurgery and trauma following- they do not believe craniotomy and clot evacuation will improve her situation- will follow up ct scan  -decadron   Pubic ramus fracture: Multiple minimally displace Fx without overlying wounds. Orthopedic service consulted for evaluation. Pain control with PRN dilaudid tolerating with some drowsiness no AMS.  -orthopedics following -Dilaudid 0.5 mg q2 H PRN   Atrial fibrillation likely physiologic : Supertherapeutic INR at presentation. Anticoagulation reversal and discontinuation at this time. She does have a history of prior CVA and left atrial appendage thrombus.  -digoxin 0.125 mg daily   Severe PAD with ischemia of LLE and ulceration: Nonpalpable lower extremity pulses. Ulceration of the toes on both feet L > R. Some gangrenous changes of left 4th toe. Per outpatient cardiology recs and recent angioplasty LLE revascularization is unlikely to benefit and agree would benefit from BKA. No sign of infection at this time and lower acuity than new SDH and ramus fracture.  -wound care following    CKD s/p renal transplant: Remote transplant (1999), on mycofenolate, cyclosporine, prednisone 5. Plus HSV reactivation ppx  valacyclovir.  -cyclosporine -mycophenolate  -valtrex    Insulin dependent diabetes mellitus: On levemir and januvia PTA. Is on chronic prednisone. Agree with SSI-R for now. Assess insulin TDD requirement at 24hrs and pending diet for basal dose.  -on SSI-R- assess full day SSI requirement before instituting basal dose     HFpEF, Hypertension: Stable hemodynamics at this time. Lopressor 100mg  BID, amlodipine 5mg  at PTA dose. Lasix QOD, currently seems euvolemic consider increase if evidence of volume overload.  -prn labetalol -lasix, amlodipine pt has not received any    New pulmonary  radiographic findings: Numerous nodular changes in lung apices incidentally demonstrated on CT. Calcifications also demonstrated on CXR. Could be sequelae of primary hyperparathyroidism Hx or renally mediated osteodystrophy. However agree with recommended high res chest CT in follow up setting.  Dispo: Disposition is deferred at this time, awaiting improvement of current medical problems.  The patient does have a current PCP Marva Panda, NP) and needs a hospital follow-up appointment after discharge.  Signed: Deneise Lever, MD 01/08/2016, 2:39 PM

## 2016-01-08 NOTE — Consult Note (Signed)
ORTHOPAEDIC CONSULTATION  REQUESTING PHYSICIAN: Trauma Md, MD  Chief Complaint: left hip pain  HPI: [name] [name] is a 70 y.o. female who complains of fall out of bed, she has a Subdural hematoma  Past Medical History  Diagnosis Date  . Hypertension   . Stroke [facility]) December 2011    left MCA distribution  . Permanent atrial fibrillation ([place])     coumadin clinic at [facility]. Has previously required Lovenox->Coumadin bridge.  . Severe mitral stenosis by prior echocardiogram     last echo 07/2012  . Aortic stenosis   . Symptomatic bradycardia     s/p pacer  . Depression   . HSV (herpes simplex virus) infection   . Anemia   . Hyperparathyroidism, primary ([place])     s/p parathyroidectomy  . PAD (peripheral artery disease) ([facility])     a. s/p RLE angioplasty with Dr. Kathlene Cote. b. (by Dr. Fletcher Anon)  L popliteral artery orbital atherectomy and balloon angioplasty & L SFA orbital atherectomy and balloon angioplasty 11/30/12.  [name] PVD (peripheral vascular disease) ([place])   . Presence of permanent cardiac pacemaker   . High cholesterol   . CHF (congestive heart failure) ([place])   . TIA (transient ischemic attack) 08/2009    Archie Endo 09/05/2009  . Atrial thrombus ([place])     appendage/notes 09/05/2009  . Type II diabetes mellitus ([place])   . History of blood transfusion     related to "kidney transplant"  . Chronic kidney disease     s/p renal transplant;  follows with Dr. Edrick Oh  . GERD (gastroesophageal reflux disease)     hx  . History of stomach ulcers   . Diabetic foot ulcer ([place])     "left is worse than right" (11/20/2015)   Past Surgical History  Procedure Laterality Date  . Insert / replace / remove pacemaker    . Kidney transplant  December 1999  . Abdominal angiogram  11/30/2012    ABDOMINAL AORTIC ANGIOGRAM   DR ARDIA  . Dialysis fistula creation    . Abdominal aortagram N/A 11/30/2012    Procedure: ABDOMINAL Maxcine Ham;  Surgeon: Wellington Hampshire, MD;   Location: [facility] CATH LAB;  Service: Cardiovascular;  Laterality: N/A;  . Peripheral vascular catheterization N/A 11/20/2015    Procedure: Abdominal Aortogram;  Surgeon: Wellington Hampshire, MD;  Location: [facility];  Service: Cardiovascular;  Laterality: N/A;  . Peripheral vascular catheterization Bilateral 11/20/2015    Procedure: Lower Extremity Angiography;  Surgeon: Wellington Hampshire, MD;  Location: [facility] LAB;  Service: Cardiovascular;  Laterality: Bilateral;  . Peripheral vascular catheterization  11/20/2015    Procedure: Peripheral Vascular Balloon Angioplasty;  Surgeon: [place] Hampshire, MD;  Location: [facility];  Service: Cardiovascular;;  . Tonsillectomy    . Appendectomy    . Hernia repair    . Umbilical hernia repair     Social History   Social History  . Marital Status: Legally Separated    Spouse Name: N/A  . Number of Children: N/A  . Years of Education: N/A   Social History Main Topics  . Smoking status: Former Smoker -- 0.25 packs/day for 10 years    Types: Cigarettes  . Smokeless tobacco: Never Used     Comment: "quit smoking cigarettes in the 1990s"  . Alcohol Use: No  . Drug Use: No  . Sexual Activity: No   Other Topics Concern  . None   Social History Narrative  Family History  Problem Relation Age of Onset  . Adopted: Yes  . Kidney disease Brother     died from  . Kidney disease Sister     died from  . Heart attack Neg Hx   . Stroke Neg Hx    Allergies  Allergen Reactions  . Bactrim [Sulfamethoxazole-Trimethoprim] Itching  . Aspirin Nausea Only    GI upset  . Shellfish Allergy Hives    Also nausea  . Codeine Hives and Nausea Only  . Latex Rash  . Penicillins Nausea And Vomiting and Rash    Has patient had a PCN reaction causing immediate rash, facial/tongue/throat swelling, SOB or lightheadedness with hypotension:No Has patient had a PCN reaction causing severe rash involving mucus membranes or skin necrosis:No Has patient  had a PCN reaction that required hospitalization:No Has patient had a PCN reaction occurring within the last 10 years:cannot recall If all of the above answers are "NO", then may proceed with Cephalosporin use.   . Tape Itching and Rash    Please use "paper" tape   Prior to Admission medications   Medication Sig Start Date End Date Taking? Authorizing Provider  acetaminophen (TYLENOL) 500 MG tablet Take 500 mg by mouth every 6 (six) hours as needed. For pain   Yes Historical Provider, MD  amLODipine (NORVASC) 5 MG tablet Take 1 tablet (5 mg total) by mouth daily. 10/08/15  Yes Sherren Mocha, MD  azelastine (OPTIVAR) 0.05 % ophthalmic solution Place 1 drop into both eyes 2 (two) times daily as needed (for dry eyes).    Yes Historical Provider, MD  b complex-vitamin c-folic acid (NEPHRO-VITE) 0.8 MG TABS Take 1 tablet by mouth daily.    Yes Historical Provider, MD  Calcium Carbonate-Vitamin D (CALTRATE 600+D) 600-400 MG-UNIT per tablet Take 1 tablet by mouth 2 (two) times daily.    Yes Historical Provider, MD  clopidogrel (PLAVIX) 75 MG tablet Take 1 tablet (75 mg total) by mouth daily. 11/12/15 11/11/16 Yes [place] Hampshire, MD  cycloSPORINE modified (NEORAL/GENGRAF) 25 MG capsule Take 25 mg by mouth 2 (two) times daily.     Yes Historical Provider, MD  digoxin (LANOXIN) 0.125 MG tablet Take 0.125 mg by mouth daily.  05/27/11  Yes Historical Provider, MD  ergocalciferol (VITAMIN D2) 50000 UNITS capsule Take 50,000 Units by mouth every 30 (thirty) days. First of the month   Yes Historical Provider, MD  furosemide (LASIX) 20 MG tablet TAKE 1 TABLET BY MOUTH EVERY OTHER DAY 10/04/15  Yes Sherren Mocha, MD  iron polysaccharides (NIFEREX) 150 MG capsule Take 150 mg by mouth daily.  06/03/07  Yes Historical Provider, MD  JANUVIA 50 MG tablet Take 1 tablet by mouth 2 (two) times daily.  11/25/15  Yes Historical Provider, MD  LEVEMIR FLEXTOUCH 100 UNIT/ML Pen Inject 10 Units as directed at bedtime.  11/12/15  Yes  Historical Provider, MD  lidocaine (XYLOCAINE) 5 % ointment Apply 1 application topically as needed. For foot pain 11/04/15  Yes Landis Martins, DPM  metoprolol (LOPRESSOR) 100 MG tablet TAKE 1 TABLET BY MOUTH TWICE A DAY 12/09/15  Yes Sherren Mocha, MD  Multiple Vitamins-Minerals (ONE-A-DAY WOMENS 50 PLUS PO) Take 1 tablet by mouth daily.   Yes Historical Provider, MD  mycophenolate (CELLCEPT) 250 MG capsule Take 500 mg by mouth 2 (two) times daily.    Yes Historical Provider, MD  ONE TOUCH ULTRA TEST test strip TEST BLOOD SUGAR 3 TIMES A DAY (E11.9) 08/15/15  Yes Historical Provider, MD  pravastatin (PRAVACHOL) 20 MG tablet Take 1 tablet (20 mg total) by mouth every evening. 11/25/15  Yes Kristin L Alvstad, RPH-CPP  predniSONE (DELTASONE) 5 MG tablet Take 5 mg by mouth daily.     Yes Historical Provider, MD  traMADol (ULTRAM) 50 MG tablet Take 1 tablet (50 mg total) by mouth every 8 (eight) hours as needed for moderate pain. 12/31/15  Yes Landis Martins, DPM  valACYclovir (VALTREX) 500 MG tablet Take 500 mg by mouth every Monday, Wednesday, and Friday. Only on Monday, Wednesday and Friday   Yes Historical Provider, MD  warfarin (COUMADIN) 2.5 MG tablet 1 Tab daily M,W,Th,F,Sun; 1/2 tab daily Tues/Sat. Patient taking differently: Take 2.5 mg by mouth every afternoon on Sun/Mon/Wed/Thurs/Fri and 1.25 mg on Tues/Sat 11/23/15  Yes Rogelia Mire, NP   Dg Chest 2 View  01/07/2016  CLINICAL DATA:  Pain following fall. History of mitral stenosis. Patient also with history of primary hyperparathyroidism EXAM: CHEST  2 VIEW COMPARISON:  November 17, 2015 FINDINGS: Widespread parenchymal calcification is noted, stable. No edema or consolidation. There is cardiomegaly with pulmonary vascularity within normal limits. Pacemaker lead is attached to the right ventricle. No adenopathy is apparent. There is calcification in the aortic arch region. No bone lesions are evident. There are surgical clips in the medial left  upper thorax. No pneumothorax. No acute fracture evident. IMPRESSION: Widespread pulmonary parenchymal calcification. This calcification may represent metastatic pulmonary calcification from primary hyperparathyroidism. This calcification also may represent pulmonary ossification secondary to chronic long-standing mitral stenosis. Contribution from both entities is possible. The appearance is stable. There is stable cardiomegaly with primarily left atrial and right heart enlargement consistent with the chronic mitral stenosis. No edema or consolidation. No pneumothorax or acute fracture. Electronically Signed   By: Lowella Grip III M.D.   On: 01/07/2016 16:32   Dg Ankle Complete Right  01/07/2016  CLINICAL DATA:  Fall from upright position this morning, right ankle pain EXAM: RIGHT ANKLE - COMPLETE 3+ VIEW COMPARISON:  None. FINDINGS: Three views of the right ankle submitted. Atherosclerotic vascular calcifications are noted. Tiny plantar spur of calcaneus. Ankle mortise is preserved. IMPRESSION: No acute fracture or subluxation. Atherosclerotic vascular calcifications are noted. Tiny plantar spur of calcaneus. Electronically Signed   By: Lahoma Crocker M.D.   On: 01/07/2016 16:36   Ct Head Wo Contrast  01/07/2016  CLINICAL DATA:  Initial evaluation for acute mental status change from earlier. EXAM: CT HEAD WITHOUT CONTRAST TECHNIQUE: Contiguous axial images were obtained from the base of the skull through the vertex without intravenous contrast. COMPARISON:  Prior CT performed earlier the same day. FINDINGS: Previously identified acute left holo hemispheric subdural hematoma appears increased in size, now measuring up to 10 mm at the level of the left frontal convexity, a 9 mm at the level of the left parietal convexity. Associated mild mass effect on the subjacent left cerebral hemisphere with up to 4 mm of right-to-left shift. Tiny subdural hematoma overlying the right frontal convexity not significantly  changed, measuring up to 3 mm without significant mass effect. Scattered acute subarachnoid hemorrhage within the left frontal parietal region has increased, with a more confluent area of hemorrhage within the left frontal lobe now measuring 14 x 25 mm. Mild edema surrounding this area of hemorrhage which may in part reflect parenchymal hematoma as well. Small amount of subarachnoid hemorrhage within the left sylvian fissure, similar to prior. Probable additional subarachnoid hemorrhage ventral to the upper cervical spinal cord just below  the foramen magnum (series 2, image 1). No acute large vessel territory infarct. Remote posterior reader right MCA territory infarct again noted. Scattered vascular calcifications within the carotid siphons and distal vertebral arteries. Extensive vascular calcifications also noted within the extracranial vasculature of the head and upper neck. No hydrocephalus or evidence for ventricular trapping. Basilar cisterns remain patent. Evolving left frontoparietal scalp contusion. No acute abnormality about the orbits. Polypoid mucosal thickening within the left maxillary sinus. Paranasal sinuses are otherwise clear. No mastoid effusion. Calvarium intact. Visualized bones demonstrate a somewhat mottled appearance, may be related to underlying renal osteodystrophy given history of underlying chronic kidney disease. Dural calcifications noted. IMPRESSION: 1. Interval increase in size of holo hemispheric left subdural hematoma, now measuring up to 10 mm. Associated 4 mm of left-to-right shift. No hydrocephalus or evidence of ventricular trapping. 2. Interval increase in left cerebral subarachnoid hemorrhage. Probable small amount of acute subarachnoid hemorrhage ventral to the upper cervical spinal cord as well, similar. 3. Similar size and appearance of tiny acute subdural hematoma overlying the right frontal convexity without significant mass effect. Electronically Signed   By: Jeannine Boga M.D.   On: 01/07/2016 23:19   Ct Head Wo Contrast  01/07/2016  CLINICAL DATA:  Post fall hitting left frontal region of head now with headache, dizziness and blurred vision. Headache radiating to the left side of the neck. EXAM: CT HEAD WITHOUT CONTRAST CT CERVICAL SPINE WITHOUT CONTRAST TECHNIQUE: Multidetector CT imaging of the head and cervical spine was performed following the standard protocol without intravenous contrast. Multiplanar CT image reconstructions of the cervical spine were also generated. COMPARISON:  Head CT - 11/17/2015 ; 08/18/2009 FINDINGS: CT HEAD FINDINGS There is a minimal amount of asymmetric soft tissue swelling about the left parietal calvarium (image 22, series 2). This finding is without associated displaced calvarial fracture or radiopaque foreign body. There is a moderate sized crescentic subdural hematoma about primarily the left frontoparietal lobe which measures approximately 0.9 cm in greatest diameter (image 19, series 2). Additionally, there is a small amount of subarachnoid hemorrhage seen within several sulci about the left parietal lobe (Representative image 21, series 2). No intraventricular extension. Minimal regional mass effect with associated approximately 3 mm of left-to-right midline shift and very minimal effacement of the left lateral ventricle. There is a suspected very tiny (approximately 2 mm) subdural hematoma about the right frontal lobe (representative images 14 and 16, series 2) without associated adjacent mass effect. There is an old subcortical infarct involving the posterior aspect of the right parietal lobe (is 22, series 2). No CT evidence of superimposed acute large territory infarct. No intraparenchymal or extra-axial mass. No evidence of hydrocephalus. Extensive intracranial vascular calcifications. Additionally, there is exuberant vascular calcifications about the small vessels of the scalp as well as the tentorium and about the dura with  mottled mixed lytic and sclerotic appearance of the calvarium, unchanged and likely compatible with renal osteodystrophy in the setting of a patient with known prior renal transplantation. CT CERVICAL SPINE FINDINGS C1 to the superior endplate of T4 is imaged. There is minimal (approximately 2 mm) of anterolisthesis of C5 upon C6. The bilateral facets are normally aligned. The dens is normally positioned between the lateral masses of C1. Mild degenerative change of the atlantodental articulation. Normal atlantoaxial articulations. No fracture or static subluxation of the cervical spine. Cervical vertebral and heights are preserved. Prevertebral soft tissues are normal. Mild multilevel cervical spine DDD, worse at C6-C7 with disc space height loss,  endplate irregularity and sclerosis. Calcifications within the bilateral carotid bulbs, right greater than left. Extensive small vessel vascular calcifications about the neck and base of skull. No bulky cervical lymphadenopathy on this noncontrast examination. Normal noncontrast appearance of the thyroid gland. Limited visualization of lung apices demonstrates rather extensive biapical ill-defined nodular airspace opacities (representative image 105, series 3). Right anterior chest wall pacemaker. IMPRESSION: Head CT Impression: 1. Moderate-sized subdural hematoma about the left frontoparietal lobe measuring approximately 9 mm in diameter and resulting in approximately 3 mm left-to-right midline shift. 2. Small amount of subarachnoid hemorrhage located within several adjacent sulci of the left parietal lobe. No intraventricular extension. 3. Suspected tiny (approximately 2 mm) subdural hematoma about the contralateral right frontal lobe without associated mass effect 4. Minimal soft tissue swelling about the left parietal calvarium without associated fracture radiopaque foreign body. 5. Old infarct involving the posterior subcortical aspect of the right parietal lobe.  Cervical spine CT Impression: 1. No fracture static subluxation of cervical spine. 2. Rather extensive nodular airspace opacities within the imaged bilateral lung apices, incompletely evaluated. Further evaluation with nonemergent dedicated chest CT could be performed as clinically indicated. 3. 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 Critical Value/emergent results were called by telephone at the time of interpretation on 01/07/2016 at 4:54 pm to Dr. Isla Pence , who verbally acknowledged these results. Electronically Signed   By: Sandi Mariscal M.D.   On: 01/07/2016 17:01   Ct Cervical Spine Wo Contrast  01/07/2016  CLINICAL DATA:  Post fall hitting left frontal region of head now with headache, dizziness and blurred vision. Headache radiating to the left side of the neck. EXAM: CT HEAD WITHOUT CONTRAST CT CERVICAL SPINE WITHOUT CONTRAST TECHNIQUE: Multidetector CT imaging of the head and cervical spine was performed following the standard protocol without intravenous contrast. Multiplanar CT image reconstructions of the cervical spine were also generated. COMPARISON:  Head CT - 11/17/2015 ; 08/18/2009 FINDINGS: CT HEAD FINDINGS There is a minimal amount of asymmetric soft tissue swelling about the left parietal calvarium (image 22, series 2). This finding is without associated displaced calvarial fracture or radiopaque foreign body. There is a moderate sized crescentic subdural hematoma about primarily the left frontoparietal lobe which measures approximately 0.9 cm in greatest diameter (image 19, series 2). Additionally, there is a small amount of subarachnoid hemorrhage seen within several sulci about the left parietal lobe (Representative image 21, series 2). No intraventricular extension. Minimal regional mass effect with associated approximately 3 mm of left-to-right midline shift and very minimal effacement of the left lateral ventricle. There is a suspected very tiny  (approximately 2 mm) subdural hematoma about the right frontal lobe (representative images 14 and 16, series 2) without associated adjacent mass effect. There is an old subcortical infarct involving the posterior aspect of the right parietal lobe (is 22, series 2). No CT evidence of superimposed acute large territory infarct. No intraparenchymal or extra-axial mass. No evidence of hydrocephalus. Extensive intracranial vascular calcifications. 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 CT CERVICAL SPINE FINDINGS C1 to the superior endplate of T4 is imaged. There is minimal (approximately 2 mm) of anterolisthesis of C5 upon C6. The bilateral facets are normally aligned. The dens is normally positioned between  the lateral masses of C1. Mild degenerative change of the atlantodental articulation. Normal atlantoaxial articulations. No fracture or static subluxation of the cervical spine. Cervical vertebral and heights are preserved. Prevertebral soft tissues are normal. Mild multilevel cervical spine DDD, worse at C6-C7 with disc space height loss, endplate irregularity and sclerosis. Calcifications within the bilateral carotid bulbs, right greater than left. Extensive small vessel vascular calcifications about the neck and base of skull. No bulky cervical lymphadenopathy on this noncontrast examination. Normal noncontrast appearance of the thyroid gland. Limited visualization of lung apices demonstrates rather extensive biapical ill-defined nodular airspace opacities (representative image 105, series 3). Right anterior chest wall pacemaker. IMPRESSION: Head CT Impression: 1. Moderate-sized subdural hematoma about the left frontoparietal  lobe measuring approximately 9 mm in diameter and resulting in approximately 3 mm left-to-right midline shift. 2. Small amount of subarachnoid hemorrhage located within several adjacent sulci of the left parietal lobe. No intraventricular extension. 3. Suspected tiny (approximately 2 mm) subdural hematoma about the contralateral right frontal lobe without associated mass effect 4. Minimal soft tissue swelling about the left parietal calvarium without associated fracture radiopaque foreign body. 5. Old infarct involving the posterior subcortical aspect of the right parietal lobe. Cervical spine CT Impression: 1. No fracture static subluxation of cervical spine. 2. Rather extensive nodular airspace opacities within the imaged bilateral lung apices, incompletely evaluated. Further evaluation with nonemergent dedicated chest CT could be performed as clinically indicated. 3. 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 Critical Value/emergent results were called by telephone at the time of interpretation on 01/07/2016 at 4:54 pm to Dr. Isla Pence , who verbally acknowledged these results. Electronically Signed   By: Sandi Mariscal M.D.   On: 01/07/2016 17:01   Ct Pelvis Wo Contrast  01/08/2016  CLINICAL DATA:  Status post fall today with onset of left groin pain. Initial encounter. EXAM: CT PELVIS WITHOUT CONTRAST TECHNIQUE: Multidetector CT imaging of the pelvis was performed following the standard protocol without intravenous contrast. COMPARISON:  Plain films left hip this same day. FINDINGS: As seen on the comparison plain films, the patient has a left parasymphyseal pubic bone fracture which extends just into the left superior pubic ramus. No other fracture is identified. Specifically, there is no acetabular fracture as questioned on plain films. Bones are markedly osteopenic. The hips are located. Visualized lower lumbar spine is unremarkable. Imaged intrapelvic contents demonstrate  extensive atherosclerosis. The patient has a pelvic right kidney. Multiple small hyperintense lesions in the kidney cannot be definitively characterized but likely represent complex cysts. IMPRESSION: Left parasymphyseal pubic bone fracture extends just into the left superior pubic ramus fracture. No other acute abnormality is identified. Specifically, there is no acetabular fracture. Osteopenia. Atherosclerosis. Pelvic right kidney with small hyperintense lesions within it likely representing complex cysts. Electronically Signed   By: Inge Rise M.D.   On: 01/08/2016 14:44   Dg Hip Unilat With Pelvis 2-3 Views Left  01/07/2016  CLINICAL DATA:  Fall, LEFT groin pain EXAM: DG HIP (WITH OR WITHOUT PELVIS) 2-3V LEFT COMPARISON:  None. FINDINGS: There is a fracture of the LEFT superior and inferior pubic rami. These fractures are minimally displaced. Potential extension of fracture line to the LEFT acetabulum. Hips are located. Dedicated view of the LEFT hip demonstrates no femoral neck fracture. IMPRESSION: Minimally displaced fractures of the LEFT superior and inferior pubic rami. Potential extension to the LEFT acetabulum. Electronically Signed   By: Suzy Bouchard M.D.   On: 01/07/2016 16:32  Positive ROS: All other systems have been reviewed and were otherwise negative with the exception of those mentioned in the HPI and as above.  Labs cbc  Recent Labs  01/07/16 1636 01/07/16 2145  WBC 9.4 8.7  HGB 13.4 13.9  HCT 40.4 41.7  PLT 274 248    Labs inflam No results for input(s): CRP in the last 72 hours.  Invalid input(s): ESR  Labs coag  Recent Labs  01/08/16 0809 01/08/16 1448  INR 1.18 1.21     Recent Labs  01/07/16 1636 01/07/16 2145  NA 138 135  K 4.0 3.7  CL 101 99*  CO2 26 25  GLUCOSE 89 127*  BUN 17 14  CREATININE 0.98 0.94  CALCIUM 9.6 9.2    Physical Exam: Filed Vitals:   01/08/16 1600 01/08/16 1700  BP: 128/72 137/89  Pulse: 88 75  Temp:      Resp: 21    General: Alert, no acute distress Cardiovascular: No pedal edema Respiratory: No cyanosis, no use of accessory musculature GI: No organomegaly, abdomen is soft and non-tender Skin: No lesions in the area of chief complaint other than those listed below in MSK exam.  Neurologic: Sensation intact distally save for the below mentioned MSK exam Psychiatric: Patient is competent for consent with normal mood and affect Lymphatic: No axillary or cervical lymphadenopathy  MUSCULOSKELETAL:  LLE: pain with ROM at hip, NVI distally, compartments soft Other extremities are atraumatic with painless ROM and NVI.  Assessment: Left inf/sup rami fractures  Plan: WBAT LLE Mobilize with PT No orthopedic contraindication to dvt px.  F/u with me in the office in [place], [place], MD Cell [phone]   01/08/2016 5:56 PM

## 2016-01-08 NOTE — Progress Notes (Signed)
Patient was noted to have increased hypertension to 170/120 with heart rate elevation varying widely 90s-130s at rest. Repeat EKG was obtained showing afib with rvr of 127 bpm. Patient examined at bedside unclear if significantly changed notably easily aroused to voice, speech limited to "yes", and requiring repeated prompts for commands. Appeared to be sleeping without obvious distress after a few minutes. Will plan to start serial boluses of labetalol 10mg  plus once dilaudid 0.5 and reassess response in rate and pressure.

## 2016-01-08 NOTE — Consult Note (Signed)
WOC wound consult note Reason for Consult: left and right foot ulcers related to PAD.  Admitted for SDH, followed by trauma and neuro. She is  aphasic, only nods head and answers yes to all my questions. Noted in chart last seen by interventional cardiologist 12/24/15 reports patient at high risk for amputation on the left due to PAD.  Right foot also with ulcerations at great toe tip, reported per IC to possibly need right distal SFA intervention.   It appears patient has been in pain for some time, was in podiatry office 12/31/15 and at that time in lots of pain as well.  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  Extreme pain with any manipulation of her feet at all.   Drainage (amount, consistency, odor) minimal, however bedside nurse reports this may be where dressings were pulled away earlier, they had not been draining prior to this.  Periwound: intact, weak pulse on the right, left foot cool to the touch Dressing procedure/placement/frequency: Dr. Lindie Spruce aware of PAD issues and CLI.  Will ask bedside nursing staff to paint areas with betadine and allow to air dry, no dressings. Noted trauma has consulted ortho, patient has been declining amputation, not clear if ortho would offer any other options at this point.  Discussed POC with patient and bedside nurse.  Re consult if needed, will not follow at this time. Thanks  Davyd Podgorski [employer], CWOCN [phone])

## 2016-01-08 NOTE — Progress Notes (Signed)
Dr. Jordan Likes notified of increase in bleed. Decadron ordered. Will continue to monitor.

## 2016-01-08 NOTE — Progress Notes (Signed)
Patient ID: [name] [name], female   DOB: 09/24/1945, 70 y.o.   MRN: [id number] Medicine Attending: Case reviewed with resident physicians. Patient examined. Full note to follow. Some fluctuation in BP but currently acceptable range. I believe rise in BP & heart rate during the night was physiologic response to extension of subdural bleed. Currently on Amlodipine with prn labetolol. Decadron started by NS. She is alert, oriented, fluent speech. We will continue to follow closely with you. Cephas Darby, MD, FACP  Hematology-Oncology/Internal Medicine [phone]

## 2016-01-08 NOTE — Progress Notes (Signed)
Patient ID: [name] [name], female   DOB: 22-Feb-1946, 70 y.o.   MRN: [id number]   LOS: 1 day   Subjective: Arouses easily, seems somewhat aphasic.   Objective: Vital signs in last 24 hours: Temp:  [97.3 F (36.3 C)-99.5 F (37.5 C)] 97.3 F (36.3 C) (05/03 0800) Pulse Rate:  [33-112] 77 (05/03 1000) Resp:  [9-37] 33 (05/03 1000) BP: (127-215)/(42-159) 139/57 mmHg (05/03 1000) SpO2:  [94 %-100 %] 100 % (05/03 1000) Weight:  [36.2 kg (79 lb 12.9 oz)-41.731 kg (92 lb)] 36.2 kg (79 lb 12.9 oz) (05/02 2000) Last BM Date:  (PTA)   Laboratory  CBC  Recent Labs  01/07/16 1636 01/07/16 2145  WBC 9.4 8.7  HGB 13.4 13.9  HCT 40.4 41.7  PLT 274 248   BMET  Recent Labs  01/07/16 1636 01/07/16 2145  NA 138 135  K 4.0 3.7  CL 101 99*  CO2 26 25  GLUCOSE 89 127*  BUN 17 14  CREATININE 0.98 0.94  CALCIUM 9.6 9.2    Physical Exam General appearance: alert and no distress Resp: clear to auscultation bilaterally Cardio: Mild tachycardia GI: normal findings: bowel sounds normal and soft, non-tender Neuro: PERRL, E3V4M6=13   Assessment/Plan: Fall from 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

## 2016-01-08 NOTE — Progress Notes (Signed)
PT Cancellation Note  Patient Details Name: TANGELA DOLLIVER MRN: [id number] DOB: 1945-10-23   Cancelled Treatment:    Reason Eval/Treat Not Completed: Medical issues which prohibited therapy, active bedrest not medically ready at this time.   Fabio Asa 01/08/2016, 1:03 PM Charlotte Crumb, PT DPT  [phone]

## 2016-01-08 NOTE — Progress Notes (Signed)
Dr. Donell Beers notified of RVR that was not sustained. Orders for BMET and MAG. Orders carried out. Will continue to monitor closely.

## 2016-01-09 LAB — COMPREHENSIVE METABOLIC PANEL
ALBUMIN: 2.7 g/dL — AB (ref 3.5–5.0)
ALK PHOS: 80 U/L (ref 38–126)
ALT: 33 U/L (ref 14–54)
ANION GAP: 16 — AB (ref 5–15)
AST: 47 U/L — ABNORMAL HIGH (ref 15–41)
BILIRUBIN TOTAL: 0.9 mg/dL (ref 0.3–1.2)
BUN: 19 mg/dL (ref 6–20)
CALCIUM: 8.6 mg/dL — AB (ref 8.9–10.3)
CO2: 17 mmol/L — AB (ref 22–32)
CREATININE: 1.43 mg/dL — AB (ref 0.44–1.00)
Chloride: 105 mmol/L (ref 101–111)
GFR calc non Af Amer: 37 mL/min — ABNORMAL LOW (ref >60)
GFR, EST AFRICAN AMERICAN: 43 mL/min — AB (ref >60)
GLUCOSE: 297 mg/dL — AB (ref 65–99)
Potassium: 4.1 mmol/L (ref 3.5–5.1)
SODIUM: 138 mmol/L (ref 135–145)
TOTAL PROTEIN: 5.5 g/dL — AB (ref 6.5–8.1)

## 2016-01-09 LAB — GLUCOSE, CAPILLARY
GLUCOSE-CAPILLARY: 107 mg/dL — AB (ref 65–99)
GLUCOSE-CAPILLARY: 229 mg/dL — AB (ref 65–99)
GLUCOSE-CAPILLARY: 278 mg/dL — AB (ref 65–99)
Glucose-Capillary: 301 mg/dL — ABNORMAL HIGH (ref 65–99)
Glucose-Capillary: 72 mg/dL (ref 65–99)
Glucose-Capillary: 151 mg/dL — ABNORMAL HIGH (ref 65–99)

## 2016-01-09 LAB — PROTIME-INR
INR: 1.32 (ref 0.00–1.49)
Prothrombin Time: 16.5 seconds — ABNORMAL HIGH (ref 11.6–15.2)

## 2016-01-09 LAB — CBC
HEMATOCRIT: 35.9 % — AB (ref 36.0–46.0)
HEMOGLOBIN: 11.7 g/dL — AB (ref 12.0–15.0)
MCH: 32.1 pg (ref 26.0–34.0)
MCHC: 32.6 g/dL (ref 30.0–36.0)
MCV: 98.4 fL (ref 78.0–100.0)
Platelets: 247 10*3/uL (ref 150–400)
RBC: 3.65 MIL/uL — AB (ref 3.87–5.11)
RDW: 13.8 % (ref 11.5–15.5)
WBC: 13.1 10*3/uL — ABNORMAL HIGH (ref 4.0–10.5)

## 2016-01-09 MED ORDER — VITAMIN K1 10 MG/ML IJ SOLN
10.0000 mg | Freq: Once | INTRAMUSCULAR | Status: AC
  Administered 2016-01-09: 10 mg via SUBCUTANEOUS
  Filled 2016-01-09: qty 1

## 2016-01-09 MED ORDER — LABETALOL HCL 5 MG/ML IV SOLN
10.0000 mg | INTRAVENOUS | Status: DC | PRN
Start: 2016-01-09 — End: 2016-01-15
  Administered 2016-01-09 – 2016-01-15 (×7): 10 mg via INTRAVENOUS
  Filled 2016-01-09 (×8): qty 4

## 2016-01-09 MED ORDER — METOPROLOL TARTRATE 5 MG/5ML IV SOLN
10.0000 mg | Freq: Four times a day (QID) | INTRAVENOUS | Status: DC
  Administered 2016-01-09: 10 mg via INTRAVENOUS
  Filled 2016-01-09: qty 10

## 2016-01-09 MED ORDER — INSULIN GLARGINE 100 UNIT/ML ~~LOC~~ SOLN
7.0000 [IU] | Freq: Every day | SUBCUTANEOUS | Status: DC
  Administered 2016-01-09 – 2016-01-10 (×2): 7 [IU] via SUBCUTANEOUS
  Filled 2016-01-09 (×2): qty 0.07

## 2016-01-09 MED ORDER — DIGOXIN 0.25 MG/ML IJ SOLN
0.0625 mg | Freq: Every day | INTRAMUSCULAR | Status: DC
  Administered 2016-01-09 – 2016-01-13 (×5): 0.0625 mg via INTRAVENOUS
  Filled 2016-01-09 (×5): qty 2

## 2016-01-09 MED ORDER — SODIUM CHLORIDE 0.45 % IV SOLN
INTRAVENOUS | Status: DC
Start: 2016-01-09 — End: 2016-01-13
  Administered 2016-01-09 (×2): via INTRAVENOUS
  Administered 2016-01-10: 1000 mL via INTRAVENOUS
  Administered 2016-01-11: 75 mL/h via INTRAVENOUS
  Administered 2016-01-12 (×2): via INTRAVENOUS
  Administered 2016-01-13: 1000 mL via INTRAVENOUS

## 2016-01-09 NOTE — Clinical Documentation Improvement (Addendum)
[facility]  Please document query responses in the progress notes and discharge summary. Please do not document query responses in the CDI BPA from in [facility].   Thank you.  Possible Clinical Conditions:  - Cerebral Edema, including any associated causes and/or conditions  - Other condition  - Unable to clinically determine  Clinical Information: Patient admitted with traumatic subdural and subarachnoid hemorrhage Initial CT Head showing soft tissue swelling, 3 mm of left to right midline shift Repeat CT Head showing associated mild mass effect on the subjacent left cerebral hemisphere with up to 4 mm of right-to-left shift.  "Decadron was added to decrease intracranial pressure by neurosurgery" documented by Internal Medicine   Please exercise your independent, professional judgment when responding. A specific answer is not anticipated or expected.   Thank You, Jerral Ralph  RN BSN CCDS [phone] Health Information Management [place]

## 2016-01-09 NOTE — Progress Notes (Signed)
Inpatient Rehabilitation  Patient was screened per PT request by Fae Pippin for appropriateness for an Inpatient Acute Rehab consult.  At this time we are recommending an Inpatient Rehab consult.  MD paged; please order if you are agreeable.    Charlane Ferretti., CCC/SLP Admission Coordinator  [facility] Inpatient Rehabilitation  Cell [phone]

## 2016-01-09 NOTE — Progress Notes (Signed)
OT Cancellation Note  Patient Details Name: [name] [name] MRN: [id number] DOB: 1946-06-07   Cancelled Treatment:    Reason Eval/Treat Not Completed: Other (comment) Pt with bedrest orders. Please update activity orders in order to initiate therapy. Thanks [facility] Ivelisse Culverhouse, OTR/L  [phone] 01/09/2016 01/09/2016, 5:47 AM

## 2016-01-09 NOTE — Progress Notes (Signed)
No significant events overnight. Patient's aphasia slightly worse. Able to say a few words and answer yes no questions. Patient still following commands bilaterally. Motor examination 5/5 bilaterally without pronator drift.  Left convexity small subdural hematoma without mass effect. Patient also with multiple cortical contusions on left. Patient's aphasia secondary to these injuries however I do not think that craniotomy and evacuation of this subdural hematoma is likely to improve her situation. Recommend continued conservative management. Follow-up head CT scan in morning.

## 2016-01-09 NOTE — Progress Notes (Signed)
Patient ID: [name] [name], female   DOB: 26-Nov-1945, 70 y.o.   MRN: [id number] Medicine attending: Patient examined together with resident physician Dr. Deneise Lever and I concur with his management which will be detailed in his subsequent progress note. Neurologically worse this morning with complete expressive aphasia. She is anxious. She can still follow commands appropriately. Repeat CT brain pending. No immediate plans for surgical drainage since 8 his not felt by neurosurgery that this would improve her condition. Impression: #1. Essential hypertension: Blood pressure is now running on the low side. Antihypertensives on hold. #2. Type 2 diabetes: Blood sugars fluctuating but overall in acceptable range. Still on Decadron. We are treating the diabetes with short acting sliding scale insulin. Will add low dose long-acting insulin if necessary. #3. Coumadin over anticoagulation on admission: Minimal decrease in hemoglobin at 11.7 g. Platelets normal. Pro time now slowly rising above control limit at 16.5 seconds, INR 1.3. We will give an additional dose of vitamin K subcutaneous. #4. Peripheral vascular disease. Ischemic digits and ulcers on the toes of both feet. Observation alone at this time unless and until condition stabilizes she would not be a candidate for aggressive amputation.  Cephas Darby, MD, FACP  Hematology-Oncology/Internal Medicine

## 2016-01-09 NOTE — Progress Notes (Signed)
Inpatient Diabetes Program Recommendations  AACE/ADA: New Consensus Statement on Inpatient Glycemic Control (2015)  Target Ranges:  Prepandial:   less than 140 mg/dL      Peak postprandial:   less than 180 mg/dL (1-2 hours)      Critically ill patients:  140 - 180 mg/dL   Review of Glycemic Control:  Results for UBAH, LAIDLEY (MRN [id number]) as of 01/09/2016 12:00  Ref. Range 01/08/2016 19:52 01/08/2016 23:17 01/09/2016 03:07 01/09/2016 08:09 01/09/2016 11:55  Glucose-Capillary Latest Ref Range: 65-99 mg/dL 952 (H) 841 (H) 324 (H) 107 (H) 229 (H)   Diabetes history: Type 2 diabetes Outpatient Diabetes medications: Januvia 50 mg daily, Levemir 10 units Current orders for Inpatient glycemic control:  Novolog resistant q 4 hours, Decadron 4 mg IV q 6 hours  Inpatient Diabetes Program Recommendations:   Please restart patient's home dose of Levemir 10 units daily.   Thanks, Beryl Meager, RN, BC-ADM Inpatient Diabetes Coordinator Pager [phone] (8a-5p)

## 2016-01-09 NOTE — Progress Notes (Signed)
Internal Medicine Daily Consult Note   Date: 01/09/2016               Patient Name:  [name] [name] MRN: [id number]  DOB: 08/10/1946 Age / Sex: 70 y.o., female   PCP: Marva Panda, NP         Requesting Physician: Dr. Minerva Fester Md, MD    Consulting Reason:  Management of multiple medical comorbidities     Chief Complaint: Subdural hematoma, pubic ramus fracture  Subjective:  Pt seemed to have worsened neurologically overnight and was very aphasic this morning. She was able to follow commands, but not able to express where she was.  Otherwise no acute events- blood pressure on the lower side.    Meds: Current Facility-Administered Medications  Medication Dose Route Frequency Provider Last Rate Last Dose  . 0.45 % sodium chloride infusion   Intravenous Continuous Jimmye Norman, MD 75 mL/hr at 01/09/16 1123    . antiseptic oral rinse (CPC / CETYLPYRIDINIUM CHLORIDE 0.05%) solution 7 mL  7 mL Mouth Rinse q12n4p Jimmye Norman, MD   7 mL at 01/09/16 1200  . chlorhexidine (PERIDEX) 0.12 % solution 15 mL  15 mL Mouth Rinse BID Jimmye Norman, MD   15 mL at 01/09/16 1100  . cycloSPORINE modified (NEORAL) capsule 25 mg  25 mg Oral BID Harriette Bouillon, MD   25 mg at 01/07/16 2355  . dexamethasone (DECADRON) injection 4 mg  4 mg Intravenous Q6H Julio Sicks, MD   4 mg at 01/09/16 1125  . digoxin (LANOXIN) 0.25 MG/ML injection 0.0625 mg  0.0625 mg Intravenous Daily Jimmye Norman, MD   0.0625 mg at 01/09/16 1125  . HYDROmorphone (DILAUDID) injection 0.5 mg  0.5 mg Intravenous Q2H PRN Harriette Bouillon, MD   0.5 mg at 01/08/16 0901  . hydroxypropyl methylcellulose / hypromellose (ISOPTO TEARS / GONIOVISC) 2.5 % ophthalmic solution 1 drop  1 drop Both Eyes QID PRN Harriette Bouillon, MD      . insulin aspart (novoLOG) injection 0-20 Units  0-20 Units Subcutaneous Q4H Harriette Bouillon, MD   15 Units at 01/09/16 0418  . labetalol (NORMODYNE,TRANDATE) injection 10 mg  10 mg Intravenous Q2H PRN Levert Feinstein, MD       . mycophenolate (CELLCEPT) capsule 500 mg  500 mg Oral BID Harriette Bouillon, MD   500 mg at 01/07/16 2355  . ondansetron (ZOFRAN) tablet 4 mg  4 mg Oral Q6H PRN Harriette Bouillon, MD       Or  . ondansetron (ZOFRAN) injection 4 mg  4 mg Intravenous Q6H PRN Harriette Bouillon, MD      . phytonadione (VITAMIN K) SQ injection 10 mg  10 mg Subcutaneous Once Levert Feinstein, MD      . pravastatin (PRAVACHOL) tablet 20 mg  20 mg Oral QPM Harriette Bouillon, MD      . valACYclovir (VALTREX) tablet 500 mg  500 mg Oral Q M,W,F Harriette Bouillon, MD      . Melene Muller ON 01/20/2016] Vitamin D (Ergocalciferol) (DRISDOL) capsule 50,000 Units  50,000 Units Oral Q30 days Harriette Bouillon, MD        Allergies: Allergies as of 01/07/2016 - Review Complete 01/07/2016  Allergen Reaction Noted  . Bactrim [sulfamethoxazole-trimethoprim] Itching 05/17/2012  . Aspirin Nausea Only 10/29/2015  . Shellfish allergy Hives 11/20/2015  . Codeine Hives and Nausea Only   . Latex Rash 06/19/2013  . Penicillins Nausea And Vomiting and Rash 01/06/2012  . Tape Itching and Rash 11/19/2015   Past Medical  History  Diagnosis Date  . Hypertension   . Stroke [facility]) December 2011    left MCA distribution  . Permanent atrial fibrillation (HCC)     coumadin clinic at [facility]. Has previously required Lovenox->Coumadin bridge.  . Severe mitral stenosis by prior echocardiogram     last echo 07/2012  . Aortic stenosis   . Symptomatic bradycardia     s/p pacer  . Depression   . HSV (herpes simplex virus) infection   . Anemia   . Hyperparathyroidism, primary (HCC)     s/p parathyroidectomy  . PAD (peripheral artery disease) (HCC)     a. s/p RLE angioplasty with [name]. b. (by [name])  L popliteral artery orbital atherectomy and balloon angioplasty & L SFA orbital atherectomy and balloon angioplasty 11/30/12.  [name] PVD (peripheral vascular disease) (HCC)   . Presence of permanent cardiac pacemaker   . High cholesterol   . CHF  (congestive heart failure) (HCC)   . TIA (transient ischemic attack) 08/2009    [name] 09/05/2009  . Atrial thrombus (HCC)     appendage/notes 09/05/2009  . Type II diabetes mellitus (HCC)   . History of blood transfusion     related to "kidney transplant"  . Chronic kidney disease     s/p renal transplant;  follows with [name]  . GERD (gastroesophageal reflux disease)     hx  . History of stomach ulcers   . Diabetic foot ulcer (HCC)     "left is worse than right" (11/20/2015)   Past Surgical History  Procedure Laterality Date  . Insert / replace / remove pacemaker    . Kidney transplant  December 1999  . Abdominal angiogram  11/30/2012    ABDOMINAL AORTIC ANGIOGRAM   DR ARDIA  . Dialysis fistula creation    . Abdominal aortagram N/A 11/30/2012    Procedure: ABDOMINAL [name];  Surgeon: [name], MD;  Location[facility] CATH LAB;  Service: Cardiovascular;  Laterality: N/A;  . Peripheral vascular catheterization N/A 11/20/2015    Procedure: Abdominal Aortogram;  Surgeon: [name], MD;  Location: [facility] INVASIVE CV LAB;  Service: Cardiovascular;  Laterality: N/A;  . Peripheral vascular catheterization Bilateral 11/20/2015    Procedure: Lower Extremity Angiography;  Surgeon: [name], MD;  Location: [facility] INVASIVE CV LAB;  Service: Cardiovascular;  Laterality: Bilateral;  . Peripheral vascular catheterization  11/20/2015    Procedure: Peripheral Vascular Balloon Angioplasty;  Surgeon: [name], MD;  Location: [facility] INVASIVE CV LAB;  Service: Cardiovascular;;  . Tonsillectomy    . Appendectomy    . Hernia repair    . Umbilical hernia repair     Family History  Problem Relation Age of Onset  . Adopted: Yes  . Kidney disease Brother     died from  . Kidney disease Sister     died from  . Heart attack Neg Hx   . Stroke Neg Hx    Social History   Social History  . Marital Status: Legally Separated    Spouse Name: N/A  . Number of Children: N/A  . Years  of Education: N/A   Occupational History  . Not on file.   Social History Main Topics  . Smoking status: Former Smoker -- 0.25 packs/day for 10 years    Types: Cigarettes  . Smokeless tobacco: Never Used     Comment: "quit smoking cigarettes in the 1990s"  . Alcohol Use: No  . Drug Use: No  .  Sexual Activity: No   Other Topics Concern  . Not on file   Social History Narrative    Physical Exam: Blood pressure 119/82, pulse 77, temperature 98.1 F (36.7 C), temperature source Oral, resp. rate 21, height 5\' 2"  (1.575 m), weight 79 lb 12.9 oz (36.2 kg), SpO2 99 %.  General: Vital signs reviewed. Patient sleepy, endorsed expressive aphasia HEENT: EOMI, PERRL Cardiovascular: 3/6 systolic murmur, regular rate Pulmonary/Chest: unable to examine right lung due to discomfort, left lung showed no wheezing or crackles  Abdominal: Soft, non-tender, non-distended, BS + Extremities: No lower extremity edema bilaterally, minimal DP pulses, has ischemic ulcers 0.5 cm b/l  Skin: Warm, dry and intact. No rashes or erythema.   Lab results: Basic Metabolic Panel:  Recent Labs  54/09/81 2213 01/09/16 0256  NA 138 138  K 3.9 4.1  CL 106 105  CO2 19* 17*  GLUCOSE 268* 297*  BUN 18 19  CREATININE 1.38* 1.43*  CALCIUM 8.6* 8.6*  MG 1.4*  --    Liver Function Tests:  Recent Labs  01/07/16 2145 01/09/16 0256  AST 53* 47*  ALT 39 33  ALKPHOS 100 80  BILITOT 1.0 0.9  PROT 6.4* 5.5*  ALBUMIN 3.1* 2.7*   No results for input(s): LIPASE, AMYLASE in the last 72 hours. No results for input(s): AMMONIA in the last 72 hours. CBC:  Recent Labs  01/07/16 2145 01/09/16 0256  WBC 8.7 13.1*  HGB 13.9 11.7*  HCT 41.7 35.9*  MCV 98.1 98.4  PLT 248 247   Cardiac Enzymes:  Recent Labs  01/07/16 1636  TROPONINI <0.03   BNP: No results for input(s): PROBNP in the last 72 hours. D-Dimer: No results for input(s): DDIMER in the last 72 hours. CBG:  Recent Labs  01/08/16 1513  01/08/16 1952 01/08/16 2317 01/09/16 0307 01/09/16 0809 01/09/16 1155  GLUCAP 115* 246* 239* 301* 107* 229*   Hemoglobin A1C: No results for input(s): HGBA1C in the last 72 hours. Fasting Lipid Panel: No results for input(s): CHOL, HDL, LDLCALC, TRIG, CHOLHDL, LDLDIRECT in the last 72 hours. Thyroid Function Tests: No results for input(s): TSH, T4TOTAL, FREET4, T3FREE, THYROIDAB in the last 72 hours. Anemia Panel: No results for input(s): VITAMINB12, FOLATE, FERRITIN, TIBC, IRON, RETICCTPCT in the last 72 hours. Coagulation:  Recent Labs  01/08/16 1448 01/09/16 0256  LABPROT 15.5* 16.5*  INR 1.21 1.32   Urine Drug Screen: Drugs of Abuse  No results found for: LABOPIA, COCAINSCRNUR, LABBENZ, AMPHETMU, THCU, LABBARB  Alcohol Level: No results for input(s): ETH in the last 72 hours. Urinalysis: No results for input(s): COLORURINE, LABSPEC, PHURINE, GLUCOSEU, HGBUR, BILIRUBINUR, KETONESUR, PROTEINUR, UROBILINOGEN, NITRITE, LEUKOCYTESUR in the last 72 hours.  Invalid input(s): APPERANCEUR   Imaging results:  Dg Chest 2 View  01/07/2016  CLINICAL DATA:  Pain following fall. History of mitral stenosis. Patient also with history of primary hyperparathyroidism EXAM: CHEST  2 VIEW COMPARISON:  November 17, 2015 FINDINGS: Widespread parenchymal calcification is noted, stable. No edema or consolidation. There is cardiomegaly with pulmonary vascularity within normal limits. Pacemaker lead is attached to the right ventricle. No adenopathy is apparent. There is calcification in the aortic arch region. No bone lesions are evident. There are surgical clips in the medial left upper thorax. No pneumothorax. No acute fracture evident. IMPRESSION: Widespread pulmonary parenchymal calcification. This calcification may represent metastatic pulmonary calcification from primary hyperparathyroidism. This calcification also may represent pulmonary ossification secondary to chronic long-standing mitral  stenosis. Contribution from both entities is  possible. The appearance is stable. There is stable cardiomegaly with primarily left atrial and right heart enlargement consistent with the chronic mitral stenosis. No edema or consolidation. No pneumothorax or acute fracture. Electronically Signed   By: Bretta Bang III M.D.   On: 01/07/2016 16:32   Dg Ankle Complete Right  01/07/2016  CLINICAL DATA:  Fall from upright position this morning, right ankle pain EXAM: RIGHT ANKLE - COMPLETE 3+ VIEW COMPARISON:  None. FINDINGS: Three views of the right ankle submitted. Atherosclerotic vascular calcifications are noted. Tiny plantar spur of calcaneus. Ankle mortise is preserved. IMPRESSION: No acute fracture or subluxation. Atherosclerotic vascular calcifications are noted. Tiny plantar spur of calcaneus. Electronically Signed   By: Natasha Mead M.D.   On: 01/07/2016 16:36   Ct Head Wo Contrast  01/07/2016  CLINICAL DATA:  Initial evaluation for acute mental status change from earlier. EXAM: CT HEAD WITHOUT CONTRAST TECHNIQUE: Contiguous axial images were obtained from the base of the skull through the vertex without intravenous contrast. COMPARISON:  Prior CT performed earlier the same day. FINDINGS: Previously identified acute left holo hemispheric subdural hematoma appears increased in size, now measuring up to 10 mm at the level of the left frontal convexity, a 9 mm at the level of the left parietal convexity. Associated mild mass effect on the subjacent left cerebral hemisphere with up to 4 mm of right-to-left shift. Tiny subdural hematoma overlying the right frontal convexity not significantly changed, measuring up to 3 mm without significant mass effect. Scattered acute subarachnoid hemorrhage within the left frontal parietal region has increased, with a more confluent area of hemorrhage within the left frontal lobe now measuring 14 x 25 mm. Mild edema surrounding this area of hemorrhage which may in part reflect  parenchymal hematoma as well. Small amount of subarachnoid hemorrhage within the left sylvian fissure, similar to prior. Probable additional subarachnoid hemorrhage ventral to the upper cervical spinal cord just below the foramen magnum (series 2, image 1). No acute large vessel territory infarct. Remote posterior reader right MCA territory infarct again noted. Scattered vascular calcifications within the carotid siphons and distal vertebral arteries. Extensive vascular calcifications also noted within the extracranial vasculature of the head and upper neck. No hydrocephalus or evidence for ventricular trapping. Basilar cisterns remain patent. Evolving left frontoparietal scalp contusion. No acute abnormality about the orbits. Polypoid mucosal thickening within the left maxillary sinus. Paranasal sinuses are otherwise clear. No mastoid effusion. Calvarium intact. Visualized bones demonstrate a somewhat mottled appearance, may be related to underlying renal osteodystrophy given history of underlying chronic kidney disease. Dural calcifications noted. IMPRESSION: 1. Interval increase in size of holo hemispheric left subdural hematoma, now measuring up to 10 mm. Associated 4 mm of left-to-right shift. No hydrocephalus or evidence of ventricular trapping. 2. Interval increase in left cerebral subarachnoid hemorrhage. Probable small amount of acute subarachnoid hemorrhage ventral to the upper cervical spinal cord as well, similar. 3. Similar size and appearance of tiny acute subdural hematoma overlying the right frontal convexity without significant mass effect. Electronically Signed   By: Rise Mu M.D.   On: 01/07/2016 23:19   Ct Head Wo Contrast  01/07/2016  CLINICAL DATA:  Post fall hitting left frontal region of head now with headache, dizziness and blurred vision. Headache radiating to the left side of the neck. EXAM: CT HEAD WITHOUT CONTRAST CT CERVICAL SPINE WITHOUT CONTRAST TECHNIQUE: Multidetector  CT imaging of the head and cervical spine was performed following the standard protocol without intravenous contrast. Multiplanar  CT image reconstructions of the cervical spine were also generated. COMPARISON:  Head CT - 11/17/2015 ; 08/18/2009 FINDINGS: CT HEAD FINDINGS There is a minimal amount of asymmetric soft tissue swelling about the left parietal calvarium (image 22, series 2). This finding is without associated displaced calvarial fracture or radiopaque foreign body. There is a moderate sized crescentic subdural hematoma about primarily the left frontoparietal lobe which measures approximately 0.9 cm in greatest diameter (image 19, series 2). Additionally, there is a small amount of subarachnoid hemorrhage seen within several sulci about the left parietal lobe (Representative image 21, series 2). No intraventricular extension. Minimal regional mass effect with associated approximately 3 mm of left-to-right midline shift and very minimal effacement of the left lateral ventricle. There is a suspected very tiny (approximately 2 mm) subdural hematoma about the right frontal lobe (representative images 14 and 16, series 2) without associated adjacent mass effect. There is an old subcortical infarct involving the posterior aspect of the right parietal lobe (is 22, series 2). No CT evidence of superimposed acute large territory infarct. No intraparenchymal or extra-axial mass. No evidence of hydrocephalus. Extensive intracranial vascular calcifications. 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 CT CERVICAL SPINE FINDINGS C1 to the superior endplate of T4 is imaged. There is minimal (approximately 2 mm) of anterolisthesis of C5  upon C6. The bilateral facets are normally aligned. The dens is normally positioned between the lateral masses of C1. Mild degenerative change of the atlantodental articulation. Normal atlantoaxial articulations. No fracture or static subluxation of the cervical spine. Cervical vertebral and heights are preserved. Prevertebral soft tissues are normal. Mild multilevel cervical spine DDD, worse at C6-C7 with disc space height loss, endplate irregularity and sclerosis. Calcifications within the bilateral carotid bulbs, right greater than left. Extensive small vessel vascular calcifications about the neck and base of skull. No bulky cervical lymphadenopathy on this noncontrast examination. Normal noncontrast appearance of the thyroid gland. Limited visualization of lung apices demonstrates rather extensive biapical ill-defined nodular airspace opacities (representative image 105, series 3). Right anterior chest wall pacemaker. IMPRESSION: Head CT Impression: 1. Moderate-sized subdural hematoma about the left frontoparietal lobe measuring approximately 9 mm in diameter and resulting in approximately 3 mm left-to-right midline shift. 2. Small amount of subarachnoid hemorrhage located within several adjacent sulci of the left parietal lobe. No intraventricular extension. 3. Suspected tiny (approximately 2 mm) subdural hematoma about the contralateral right frontal lobe without associated mass effect 4. Minimal soft tissue swelling about the left parietal calvarium without associated fracture radiopaque foreign body. 5. Old infarct involving the posterior subcortical aspect of the right parietal lobe. Cervical spine CT Impression: 1. No fracture static subluxation of cervical spine. 2. Rather extensive nodular airspace opacities within the imaged bilateral lung apices, incompletely evaluated. Further evaluation with nonemergent dedicated chest CT could be performed as clinically indicated. 3. Stigmata of suspected advanced  renal osteodystrophy as detailed above. Critical Value/emergent results were called by telephone at the time of interpretation on 01/07/2016 at 4:54 pm to Dr. Jacalyn Lefevre , who verbally acknowledged these results. Electronically Signed   By: Simonne Come M.D.   On: 01/07/2016 17:01   Ct Cervical Spine Wo Contrast  01/07/2016  CLINICAL DATA:  Post fall hitting left frontal region  of head now with headache, dizziness and blurred vision. Headache radiating to the left side of the neck. EXAM: CT HEAD WITHOUT CONTRAST CT CERVICAL SPINE WITHOUT CONTRAST TECHNIQUE: Multidetector CT imaging of the head and cervical spine was performed following the standard protocol without intravenous contrast. Multiplanar CT image reconstructions of the cervical spine were also generated. COMPARISON:  Head CT - 11/17/2015 ; 08/18/2009 FINDINGS: CT HEAD FINDINGS There is a minimal amount of asymmetric soft tissue swelling about the left parietal calvarium (image 22, series 2). This finding is without associated displaced calvarial fracture or radiopaque foreign body. There is a moderate sized crescentic subdural hematoma about primarily the left frontoparietal lobe which measures approximately 0.9 cm in greatest diameter (image 19, series 2). Additionally, there is a small amount of subarachnoid hemorrhage seen within several sulci about the left parietal lobe (Representative image 21, series 2). No intraventricular extension. Minimal regional mass effect with associated approximately 3 mm of left-to-right midline shift and very minimal effacement of the left lateral ventricle. There is a suspected very tiny (approximately 2 mm) subdural hematoma about the right frontal lobe (representative images 14 and 16, series 2) without associated adjacent mass effect. There is an old subcortical infarct involving the posterior aspect of the right parietal lobe (is 22, series 2). No CT evidence of superimposed acute large territory infarct. No  intraparenchymal or extra-axial mass. No evidence of hydrocephalus. Extensive intracranial vascular calcifications. 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 CT CERVICAL SPINE FINDINGS C1 to the superior endplate of T4 is imaged. There is minimal (approximately 2 mm) of anterolisthesis of C5 upon C6. The bilateral facets are normally aligned. The dens is normally positioned between the lateral masses of C1. Mild degenerative change of the atlantodental articulation. Normal atlantoaxial articulations. No fracture or static subluxation of the cervical spine. Cervical vertebral and heights are preserved. Prevertebral soft tissues are normal. Mild multilevel cervical spine DDD, worse at C6-C7 with disc space height loss, endplate irregularity and sclerosis. Calcifications within the bilateral carotid bulbs, right greater than left. Extensive small vessel vascular calcifications about the neck and base of skull. No bulky cervical lymphadenopathy on this noncontrast examination. Normal noncontrast appearance of the thyroid gland. Limited visualization of lung apices demonstrates rather extensive biapical ill-defined nodular airspace opacities (representative image 105, series 3). Right anterior chest wall pacemaker. IMPRESSION: Head CT Impression: 1. Moderate-sized subdural hematoma about the left frontoparietal lobe measuring approximately 9 mm in diameter and resulting in approximately 3 mm left-to-right midline shift. 2. Small amount of subarachnoid hemorrhage located within several adjacent sulci of the left parietal lobe. No intraventricular extension. 3. Suspected tiny (approximately 2 mm) subdural hematoma about the contralateral  right frontal lobe without associated mass effect 4. Minimal soft tissue swelling about the left parietal calvarium without associated fracture radiopaque foreign body. 5. Old infarct involving the posterior subcortical aspect of the right parietal lobe. Cervical spine CT Impression: 1. No fracture static subluxation of cervical spine. 2. Rather extensive nodular airspace opacities within the imaged bilateral lung apices, incompletely evaluated. Further evaluation with nonemergent dedicated chest CT could be performed as clinically indicated. 3. 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 Critical Value/emergent results were called by telephone at the time of interpretation  on 01/07/2016 at 4:54 pm to Dr. Jacalyn Lefevre , who verbally acknowledged these results. Electronically Signed   By: Simonne Come M.D.   On: 01/07/2016 17:01   Ct Pelvis Wo Contrast  01/08/2016  CLINICAL DATA:  Status post fall today with onset of left groin pain. Initial encounter. EXAM: CT PELVIS WITHOUT CONTRAST TECHNIQUE: Multidetector CT imaging of the pelvis was performed following the standard protocol without intravenous contrast. COMPARISON:  Plain films left hip this same day. FINDINGS: As seen on the comparison plain films, the patient has a left parasymphyseal pubic bone fracture which extends just into the left superior pubic ramus. No other fracture is identified. Specifically, there is no acetabular fracture as questioned on plain films. Bones are markedly osteopenic. The hips are located. Visualized lower lumbar spine is unremarkable. Imaged intrapelvic contents demonstrate extensive atherosclerosis. The patient has a pelvic right kidney. Multiple small hyperintense lesions in the kidney cannot be definitively characterized but likely represent complex cysts. IMPRESSION: Left parasymphyseal pubic bone fracture extends just into the left superior pubic ramus fracture. No other acute abnormality is  identified. Specifically, there is no acetabular fracture. Osteopenia. Atherosclerosis. Pelvic right kidney with small hyperintense lesions within it likely representing complex cysts. Electronically Signed   By: Drusilla Kanner M.D.   On: 01/08/2016 14:44   Dg Hip Unilat With Pelvis 2-3 Views Left  01/07/2016  CLINICAL DATA:  Fall, LEFT groin pain EXAM: DG HIP (WITH OR WITHOUT PELVIS) 2-3V LEFT COMPARISON:  None. FINDINGS: There is a fracture of the LEFT superior and inferior pubic rami. These fractures are minimally displaced. Potential extension of fracture line to the LEFT acetabulum. Hips are located. Dedicated view of the LEFT hip demonstrates no femoral neck fracture. IMPRESSION: Minimally displaced fractures of the LEFT superior and inferior pubic rami. Potential extension to the LEFT acetabulum. Electronically Signed   By: Genevive Bi M.D.   On: 01/07/2016 16:32    Other results: EKG: normal EKG, normal sinus rhythm, unchanged from previous tracings, atrial fibrillation, rate 75bpm, low voltage recording.  Assessment, Plan, & Recommendations by Problem:   Acute subdural hematoma in a patient with chronic Coumadin use- supratherapeutic with INR around 4.65.- INR rapidly reversed L: Today PT had slightly increased and INR of 1.3  -neurosurgery and trauma following- they do not believe craniotomy and clot evacuation will improve her situation- will follow up ct scan  -decadron -CT pending  -given one time dose of subQ vitamin K  Essential hypertension- blood pressure on the lower side -held amlodipine and other antihypertensives- discontinued metoprolol -decreased labetalol PRN frequency to q2 hours PRN  T2DM: Received 21 units of sliding scale insulin yesterday -started 7 units of Lantus and titrate accordingly  -SSI  Pubic ramus fracture: Multiple minimally displace Fx without overlying wounds. Orthopedic service consulted for evaluation. Pain control with PRN dilaudid  tolerating with some drowsiness no AMS.  -orthopedics following -Dilaudid 0.5 mg q2 H PRN -PT  Atrial fibrillation likely physiologic : Supertherapeutic INR at presentation. Anticoagulation reversal and discontinuation at this time. She does have a history of prior CVA and left atrial appendage thrombus.  -digoxin 0.125 mg daily    Severe PAD with ischemia of LLE and ulceration: Nonpalpable lower extremity pulses. Ulceration of the toes on both feet L > R. Some gangrenous changes of left 4th toe. Per outpatient cardiology recs and recent angioplasty LLE revascularization is unlikely to benefit and agree would benefit from BKA. No sign of infection at this time and  lower acuity than new SDH and ramus fracture.  -wound care following      Dispo: Disposition is deferred at this time, awaiting improvement of current medical problems.  The patient does have a current PCP Marva Panda, NP) and needs a hospital follow-up appointment after discharge.  Signed: Deneise Lever, MD 01/09/2016, 1:59 PM

## 2016-01-09 NOTE — Evaluation (Signed)
Clinical/Bedside Swallow Evaluation Patient Details  Name: HESTA ADAMSON MRN: [id number] Date of Birth: [birth year]  Today's Date: 01/09/2016 Time: SLP Start Time (ACUTE ONLY): 1208 SLP Stop Time (ACUTE ONLY): 1225 SLP Time Calculation (min) (ACUTE ONLY): 17 min  Past Medical History:  Past Medical History  Diagnosis Date  . Hypertension   . Stroke [facility]) December 2011    left MCA distribution  . Permanent atrial fibrillation (HCC)     coumadin clinic at [facility]. Has previously required Lovenox->Coumadin bridge.  . Severe mitral stenosis by prior echocardiogram     last echo 07/2012  . Aortic stenosis   . Symptomatic bradycardia     s/p pacer  . Depression   . HSV (herpes simplex virus) infection   . Anemia   . Hyperparathyroidism, primary (HCC)     s/p parathyroidectomy  . PAD (peripheral artery disease) (HCC)     a. s/p RLE angioplasty with [name]. b. (by [name])  L popliteral artery orbital atherectomy and balloon angioplasty & L SFA orbital atherectomy and balloon angioplasty 11/30/12.  [name] PVD (peripheral vascular disease) (HCC)   . Presence of permanent cardiac pacemaker   . High cholesterol   . CHF (congestive heart failure) (HCC)   . TIA (transient ischemic attack) 08/2009    [name] 09/05/2009  . Atrial thrombus (HCC)     appendage/notes 09/05/2009  . Type II diabetes mellitus (HCC)   . History of blood transfusion     related to "kidney transplant"  . Chronic kidney disease     s/p renal transplant;  follows with [name]  . GERD (gastroesophageal reflux disease)     hx  . History of stomach ulcers   . Diabetic foot ulcer (HCC)     "left is worse than right" (11/20/2015)   Past Surgical History:  Past Surgical History  Procedure Laterality Date  . Insert / replace / remove pacemaker    . Kidney transplant  December 1999  . Abdominal angiogram  11/30/2012    ABDOMINAL AORTIC ANGIOGRAM   DR ARDIA  . Dialysis fistula creation    .  Abdominal aortagram N/A 11/30/2012    Procedure: ABDOMINAL [name];  Surgeon: [name], MD;  Location: [facility] CATH LAB;  Service: Cardiovascular;  Laterality: N/A;  . Peripheral vascular catheterization N/A 11/20/2015    Procedure: Abdominal Aortogram;  Surgeon: [name], MD;  Location: [facility] INVASIVE CV LAB;  Service: Cardiovascular;  Laterality: N/A;  . Peripheral vascular catheterization Bilateral 11/20/2015    Procedure: Lower Extremity Angiography;  Surgeon: [name], MD;  Location: [facility] INVASIVE CV LAB;  Service: Cardiovascular;  Laterality: Bilateral;  . Peripheral vascular catheterization  11/20/2015    Procedure: Peripheral Vascular Balloon Angioplasty;  Surgeon: [name], MD;  Location: [facility] INVASIVE CV LAB;  Service: Cardiovascular;;  . Tonsillectomy    . Appendectomy    . Hernia repair    . Umbilical hernia repair     HPI:  70 y.o. female admitted after falling out of bed and sustaining SDH/SAH, left inf/sup rami fractures.  CCT small left convexity SDH, left SAH; aphasia. 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    Assessment / Plan / Recommendation Clinical Impression  Pt presents with generally functional swallow marked by slowed mastication and intermittent expectoration of solids; brisk swallow response; no overt  s/s of aspiration.  Pt not following commands; crying throughout assessment, which goddaughter attributes to pain in feet and being cold.  No efforts by pt to communicate observed aside from occasional head nod/shake. Provided reassurance, warm blanket, and repositioning to assist with comfort.  Recommend initiating a mechanical soft diet, thin liquids.  Speech/language eval pending secondary to aphasia; f/u x 1 for diet toleration.      Aspiration Risk       Diet Recommendation   dysphagia 3, thin liquids  Medication  Administration: Whole meds with puree    Other  Recommendations Oral Care Recommendations: Oral care BID   Follow up Recommendations   (tba)    Frequency and Duration min 1 x/week  1 week       Prognosis Prognosis for Safe Diet Advancement: Good      Swallow Study   General Date of Onset: 01/07/16 HPI: 70 y.o. female admitted after falling out of bed and sustaining SDH/SAH, left inf/sup rami fractures.  CCT small left convexity SDH, left SAH; aphasia. 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  Type of Study: Bedside Swallow Evaluation Previous Swallow Assessment: no Diet Prior to this Study: NPO Temperature Spikes Noted: No Respiratory Status: Nasal cannula History of Recent Intubation: No Behavior/Cognition: Alert Oral Cavity Assessment: Within Functional Limits Oral Care Completed by SLP: No Oral Cavity - Dentition: Adequate natural dentition Vision: Functional for self-feeding Self-Feeding Abilities: Needs assist;Able to feed self Patient Positioning: Upright in bed Baseline Vocal Quality: Normal Volitional Cough: Cognitively unable to elicit Volitional Swallow: Unable to elicit    Oral/Motor/Sensory Function Overall Oral Motor/Sensory Function: Within functional limits   Ice Chips Ice chips: Within functional limits   Thin Liquid Thin Liquid: Within functional limits Presentation: Cup;Self Fed;Straw    Nectar Thick Nectar Thick Liquid: Not tested   Honey Thick Honey Thick Liquid: Not tested   Puree Puree: Within functional limits Presentation: Self Fed;Spoon   Solid   Buffi Ewton L. [place], [place] CCC/SLP Pager [phone]    Solid: Impaired Presentation: Self Fed Oral Phase Functional Implications:  (intermittently expectorates mechanical solids)        Blenda Mounts Laurice 01/09/2016,12:35 PM

## 2016-01-09 NOTE — Progress Notes (Signed)
Trauma Service Note  Subjective: Patient is aphasic, probably expressive.  Jittery  Objective: Vital signs in last 24 hours: Temp:  [97.6 F (36.4 C)-98.4 F (36.9 C)] 97.6 F (36.4 C) (05/04 0800) Pulse Rate:  [29-107] 77 (05/04 0900) Resp:  [15-33] 21 (05/04 0900) BP: (98-142)/(42-112) 119/82 mmHg (05/04 0900) SpO2:  [92 %-100 %] 99 % (05/04 0900) Last BM Date: 01/09/16  Intake/Output from previous day: 05/03 0701 - 05/04 0700 In: 1800 [I.V.:1800] Out: 300 [Urine:300] Intake/Output this shift: Total I/O In: 225 [I.V.:225] Out: -   General: No distress.  Follows commands  Lungs: Clear  Abd: Soft, good bowel sounds.  Extremities: No clinical change  Neuro: CT about the same.  Will await NS advice on next scan.  Working with TBI team  Lab Results: CBC   Recent Labs  01/07/16 2145 01/09/16 0256  WBC 8.7 13.1*  HGB 13.9 11.7*  HCT 41.7 35.9*  PLT 248 247   BMET  Recent Labs  01/08/16 2213 01/09/16 0256  NA 138 138  K 3.9 4.1  CL 106 105  CO2 19* 17*  GLUCOSE 268* 297*  BUN 18 19  CREATININE 1.38* 1.43*  CALCIUM 8.6* 8.6*   PT/INR  Recent Labs  01/08/16 1448 01/09/16 0256  LABPROT 15.5* 16.5*  INR 1.21 1.32   ABG No results for input(s): PHART, HCO3 in the last 72 hours.  Invalid input(s): PCO2, PO2  Studies/Results: Dg Chest 2 View  01/07/2016  CLINICAL DATA:  Pain following fall. History of mitral stenosis. Patient also with history of primary hyperparathyroidism EXAM: CHEST  2 VIEW COMPARISON:  November 17, 2015 FINDINGS: Widespread parenchymal calcification is noted, stable. No edema or consolidation. There is cardiomegaly with pulmonary vascularity within normal limits. Pacemaker lead is attached to the right ventricle. No adenopathy is apparent. There is calcification in the aortic arch region. No bone lesions are evident. There are surgical clips in the medial left upper thorax. No pneumothorax. No acute fracture evident. IMPRESSION:  Widespread pulmonary parenchymal calcification. This calcification may represent metastatic pulmonary calcification from primary hyperparathyroidism. This calcification also may represent pulmonary ossification secondary to chronic long-standing mitral stenosis. Contribution from both entities is possible. The appearance is stable. There is stable cardiomegaly with primarily left atrial and right heart enlargement consistent with the chronic mitral stenosis. No edema or consolidation. No pneumothorax or acute fracture. Electronically Signed   By: Bretta Bang III M.D.   On: 01/07/2016 16:32   Dg Ankle Complete Right  01/07/2016  CLINICAL DATA:  Fall from upright position this morning, right ankle pain EXAM: RIGHT ANKLE - COMPLETE 3+ VIEW COMPARISON:  None. FINDINGS: Three views of the right ankle submitted. Atherosclerotic vascular calcifications are noted. Tiny plantar spur of calcaneus. Ankle mortise is preserved. IMPRESSION: No acute fracture or subluxation. Atherosclerotic vascular calcifications are noted. Tiny plantar spur of calcaneus. Electronically Signed   By: Natasha Mead M.D.   On: 01/07/2016 16:36   Ct Head Wo Contrast  01/07/2016  CLINICAL DATA:  Initial evaluation for acute mental status change from earlier. EXAM: CT HEAD WITHOUT CONTRAST TECHNIQUE: Contiguous axial images were obtained from the base of the skull through the vertex without intravenous contrast. COMPARISON:  Prior CT performed earlier the same day. FINDINGS: Previously identified acute left holo hemispheric subdural hematoma appears increased in size, now measuring up to 10 mm at the level of the left frontal convexity, a 9 mm at the level of the left parietal convexity. Associated mild mass effect  on the subjacent left cerebral hemisphere with up to 4 mm of right-to-left shift. Tiny subdural hematoma overlying the right frontal convexity not significantly changed, measuring up to 3 mm without significant mass effect. Scattered  acute subarachnoid hemorrhage within the left frontal parietal region has increased, with a more confluent area of hemorrhage within the left frontal lobe now measuring 14 x 25 mm. Mild edema surrounding this area of hemorrhage which may in part reflect parenchymal hematoma as well. Small amount of subarachnoid hemorrhage within the left sylvian fissure, similar to prior. Probable additional subarachnoid hemorrhage ventral to the upper cervical spinal cord just below the foramen magnum (series 2, image 1). No acute large vessel territory infarct. Remote posterior reader right MCA territory infarct again noted. Scattered vascular calcifications within the carotid siphons and distal vertebral arteries. Extensive vascular calcifications also noted within the extracranial vasculature of the head and upper neck. No hydrocephalus or evidence for ventricular trapping. Basilar cisterns remain patent. Evolving left frontoparietal scalp contusion. No acute abnormality about the orbits. Polypoid mucosal thickening within the left maxillary sinus. Paranasal sinuses are otherwise clear. No mastoid effusion. Calvarium intact. Visualized bones demonstrate a somewhat mottled appearance, may be related to underlying renal osteodystrophy given history of underlying chronic kidney disease. Dural calcifications noted. IMPRESSION: 1. Interval increase in size of holo hemispheric left subdural hematoma, now measuring up to 10 mm. Associated 4 mm of left-to-right shift. No hydrocephalus or evidence of ventricular trapping. 2. Interval increase in left cerebral subarachnoid hemorrhage. Probable small amount of acute subarachnoid hemorrhage ventral to the upper cervical spinal cord as well, similar. 3. Similar size and appearance of tiny acute subdural hematoma overlying the right frontal convexity without significant mass effect. Electronically Signed   By: Rise Mu M.D.   On: 01/07/2016 23:19   Ct Head Wo  Contrast  01/07/2016  CLINICAL DATA:  Post fall hitting left frontal region of head now with headache, dizziness and blurred vision. Headache radiating to the left side of the neck. EXAM: CT HEAD WITHOUT CONTRAST CT CERVICAL SPINE WITHOUT CONTRAST TECHNIQUE: Multidetector CT imaging of the head and cervical spine was performed following the standard protocol without intravenous contrast. Multiplanar CT image reconstructions of the cervical spine were also generated. COMPARISON:  Head CT - 11/17/2015 ; 08/18/2009 FINDINGS: CT HEAD FINDINGS There is a minimal amount of asymmetric soft tissue swelling about the left parietal calvarium (image 22, series 2). This finding is without associated displaced calvarial fracture or radiopaque foreign body. There is a moderate sized crescentic subdural hematoma about primarily the left frontoparietal lobe which measures approximately 0.9 cm in greatest diameter (image 19, series 2). Additionally, there is a small amount of subarachnoid hemorrhage seen within several sulci about the left parietal lobe (Representative image 21, series 2). No intraventricular extension. Minimal regional mass effect with associated approximately 3 mm of left-to-right midline shift and very minimal effacement of the left lateral ventricle. There is a suspected very tiny (approximately 2 mm) subdural hematoma about the right frontal lobe (representative images 14 and 16, series 2) without associated adjacent mass effect. There is an old subcortical infarct involving the posterior aspect of the right parietal lobe (is 22, series 2). No CT evidence of superimposed acute large territory infarct. No intraparenchymal or extra-axial mass. No evidence of hydrocephalus. Extensive intracranial vascular calcifications. Additionally, there is exuberant vascular calcifications about the small vessels of the scalp as well as the tentorium and about the dura with mottled mixed lytic and sclerotic appearance  of the  calvarium, unchanged and likely compatible with renal osteodystrophy in the setting of a patient with known prior renal transplantation. CT CERVICAL SPINE FINDINGS C1 to the superior endplate of T4 is imaged. There is minimal (approximately 2 mm) of anterolisthesis of C5 upon C6. The bilateral facets are normally aligned. The dens is normally positioned between the lateral masses of C1. Mild degenerative change of the atlantodental articulation. Normal atlantoaxial articulations. No fracture or static subluxation of the cervical spine. Cervical vertebral and heights are preserved. Prevertebral soft tissues are normal. Mild multilevel cervical spine DDD, worse at C6-C7 with disc space height loss, endplate irregularity and sclerosis. Calcifications within the bilateral carotid bulbs, right greater than left. Extensive small vessel vascular calcifications about the neck and base of skull. No bulky cervical lymphadenopathy on this noncontrast examination. Normal noncontrast appearance of the thyroid gland. Limited visualization of lung apices demonstrates rather extensive biapical ill-defined nodular airspace opacities (representative image 105, series 3). Right anterior chest wall pacemaker. IMPRESSION: Head CT Impression: 1. Moderate-sized subdural hematoma about the left frontoparietal lobe measuring approximately 9 mm in diameter and resulting in approximately 3 mm left-to-right midline shift. 2. Small amount of subarachnoid hemorrhage located within several adjacent sulci of the left parietal lobe. No intraventricular extension. 3. Suspected tiny (approximately 2 mm) subdural hematoma about the contralateral right frontal lobe without associated mass effect 4. Minimal soft tissue swelling about the left parietal calvarium without associated fracture radiopaque foreign body. 5. Old infarct involving the posterior subcortical aspect of the right parietal lobe. Cervical spine CT Impression: 1. No fracture static  subluxation of cervical spine. 2. Rather extensive nodular airspace opacities within the imaged bilateral lung apices, incompletely evaluated. Further evaluation with nonemergent dedicated chest CT could be performed as clinically indicated. 3. 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 Critical Value/emergent results were called by telephone at the time of interpretation on 01/07/2016 at 4:54 pm to Dr. Jacalyn Lefevre , who verbally acknowledged these results. Electronically Signed   By: Simonne Come M.D.   On: 01/07/2016 17:01   Ct Cervical Spine Wo Contrast  01/07/2016  CLINICAL DATA:  Post fall hitting left frontal region of head now with headache, dizziness and blurred vision. Headache radiating to the left side of the neck. EXAM: CT HEAD WITHOUT CONTRAST CT CERVICAL SPINE WITHOUT CONTRAST TECHNIQUE: Multidetector CT imaging of the head and cervical spine was performed following the standard protocol without intravenous contrast. Multiplanar CT image reconstructions of the cervical spine were also generated. COMPARISON:  Head CT - 11/17/2015 ; 08/18/2009 FINDINGS: CT HEAD FINDINGS There is a minimal amount of asymmetric soft tissue swelling about the left parietal calvarium (image 22, series 2). This finding is without associated displaced calvarial fracture or radiopaque foreign body. There is a moderate sized crescentic subdural hematoma about primarily the left frontoparietal lobe which measures approximately 0.9 cm in greatest diameter (image 19, series 2). Additionally, there is a small amount of subarachnoid hemorrhage seen within several sulci about the left parietal lobe (Representative image 21, series 2). No intraventricular extension. Minimal regional mass effect with associated approximately 3 mm of left-to-right midline shift and very minimal effacement of the left lateral ventricle. There is a suspected very tiny (approximately 2 mm) subdural hematoma about the right  frontal lobe (representative images 14 and 16, series 2) without associated adjacent mass effect. There is an old subcortical infarct involving the posterior aspect of the right parietal lobe (is 22, series 2). No CT  evidence of superimposed acute large territory infarct. No intraparenchymal or extra-axial mass. No evidence of hydrocephalus. Extensive intracranial vascular calcifications. 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 CT CERVICAL SPINE FINDINGS C1 to the superior endplate of T4 is imaged. There is minimal (approximately 2 mm) of anterolisthesis of C5 upon C6. The bilateral facets are normally aligned. The dens is normally positioned between the lateral masses of C1. Mild degenerative change of the atlantodental articulation. Normal atlantoaxial articulations. No fracture or static subluxation of the cervical spine. Cervical vertebral and heights are preserved. Prevertebral soft tissues are normal. Mild multilevel cervical spine DDD, worse at C6-C7 with disc space height loss, endplate irregularity and sclerosis. Calcifications within the bilateral carotid bulbs, right greater than left. Extensive small vessel vascular calcifications about the neck and base of skull. No bulky cervical lymphadenopathy on this noncontrast examination. Normal noncontrast appearance of the thyroid gland. Limited visualization of lung apices demonstrates rather extensive biapical ill-defined nodular airspace opacities (representative image 105, series 3). Right anterior chest wall pacemaker. IMPRESSION: Head CT Impression: 1. Moderate-sized subdural hematoma about the left frontoparietal lobe measuring approximately 9 mm in diameter and  resulting in approximately 3 mm left-to-right midline shift. 2. Small amount of subarachnoid hemorrhage located within several adjacent sulci of the left parietal lobe. No intraventricular extension. 3. Suspected tiny (approximately 2 mm) subdural hematoma about the contralateral right frontal lobe without associated mass effect 4. Minimal soft tissue swelling about the left parietal calvarium without associated fracture radiopaque foreign body. 5. Old infarct involving the posterior subcortical aspect of the right parietal lobe. Cervical spine CT Impression: 1. No fracture static subluxation of cervical spine. 2. Rather extensive nodular airspace opacities within the imaged bilateral lung apices, incompletely evaluated. Further evaluation with nonemergent dedicated chest CT could be performed as clinically indicated. 3. 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 Critical Value/emergent results were called by telephone at the time of interpretation on 01/07/2016 at 4:54 pm to Dr. Jacalyn Lefevre , who verbally acknowledged these results. Electronically Signed   By: Simonne Come M.D.   On: 01/07/2016 17:01   Ct Pelvis Wo Contrast  01/08/2016  CLINICAL DATA:  Status post fall today with onset of left groin pain. Initial encounter. EXAM: CT PELVIS WITHOUT CONTRAST TECHNIQUE: Multidetector CT imaging of the pelvis was performed following the standard protocol without intravenous contrast. COMPARISON:  Plain films left hip this same day. FINDINGS: As seen on the comparison plain films, the patient has a left parasymphyseal pubic bone fracture which extends just into the left superior pubic ramus. No other fracture is identified. Specifically, there is no acetabular fracture as questioned on plain films. Bones are markedly osteopenic. The hips are located. Visualized lower lumbar spine is unremarkable. Imaged intrapelvic contents demonstrate extensive atherosclerosis. The patient has a pelvic  right kidney. Multiple small hyperintense lesions in the kidney cannot be definitively characterized but likely represent complex cysts. IMPRESSION: Left parasymphyseal pubic bone fracture extends just into the left superior pubic ramus fracture. No other acute abnormality is identified. Specifically, there is no acetabular fracture. Osteopenia. Atherosclerosis. Pelvic right kidney with small hyperintense lesions within it likely representing complex cysts. Electronically Signed   By: Drusilla Kanner M.D.   On: 01/08/2016 14:44  Dg Hip Unilat With Pelvis 2-3 Views Left  01/07/2016  CLINICAL DATA:  Fall, LEFT groin pain EXAM: DG HIP (WITH OR WITHOUT PELVIS) 2-3V LEFT COMPARISON:  None. FINDINGS: There is a fracture of the LEFT superior and inferior pubic rami. These fractures are minimally displaced. Potential extension of fracture line to the LEFT acetabulum. Hips are located. Dedicated view of the LEFT hip demonstrates no femoral neck fracture. IMPRESSION: Minimally displaced fractures of the LEFT superior and inferior pubic rami. Potential extension to the LEFT acetabulum. Electronically Signed   By: Genevive Bi M.D.   On: 01/07/2016 16:32    Anti-infectives: Anti-infectives    Start     Dose/Rate Route Frequency Ordered Stop   01/08/16 1000  valACYclovir (VALTREX) tablet 500 mg     500 mg Oral Every M-W-F 01/07/16 1751        Assessment/Plan: s/p  Would like to advance her diet and start he back on her medications.  LOS: 2 days   Marta Lamas. Gae Bon, MD, FACS [phone] Trauma Surgeon 01/09/2016

## 2016-01-09 NOTE — Evaluation (Signed)
Physical Therapy Evaluation Patient Details Name: [name] [name] MRN: [id number] DOB: Nov 01, 1945 Today's Date: 01/09/2016   History of Present Illness  70 y.o. female admitted to [facility] on 01/07/16 with fall out of bed with resultant L SDH/SAH and L inferior/superior pubic rami fx (ortho consulted and pt is WBAT L leg).  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   Clinical Impression  Evaluation very limited by high HR when moving EOB.  HR ranged 112-175 and would go up and down between 110s and 130s-40s within seconds.  Pt seems to be in pain as she was crying and grimacing with any movement of legs, left especially (which makes sense given pubic rami fxs).  Pt did not need much assist to EOB, but given pain reactions, I have a feeling walking and standing will be much more difficult.  Pt is unable to report her baseline given expressive speech difficulties.   PT to follow acutely for deficits listed below.       Follow Up Recommendations CIR    Equipment Recommendations  None recommended by PT    Recommendations for Other Services Rehab consult     Precautions / Restrictions Precautions Precautions: Fall      Mobility  Bed Mobility Overal bed mobility: Needs Assistance Bed Mobility: Supine to Sit;Sit to Supine     Supine to sit: Min guard Sit to supine: Min guard   General bed mobility comments: Min guard assist to manually facilitate moving EOB.  Pt sat up quickly and returned to supine quickly.    Transfers                 General transfer comment: NT as pt's HR ranged from 112 to 175 max observed and would go up and down in A-fib throughout just going EOB.          Balance Overall balance assessment: Needs assistance Sitting-balance support: Feet unsupported;Bilateral upper extremity supported Sitting  balance-Leahy Scale: Fair Sitting balance - Comments: supervision seated EOB.  Sat ~2 mins EOB, however, HR was significantly up and down 112-175 just EOB, so pt positioned back in supine.                                       Pertinent Vitals/Pain Pain Assessment: Faces Faces Pain Scale: Hurts whole lot Pain Location: pt rubbing left leg in sitting EOB.  Moaning and crying the entire session.  Pain Descriptors / Indicators: Grimacing;Guarding Pain Intervention(s): Limited activity within patient's tolerance;Monitored during session;Repositioned    Home Living Family/patient expects to be discharged to:: Private residence Living Arrangements: Alone Available Help at Discharge: Family;Available PRN/intermittently Type of Home: House Home Access: Level entry     Home Layout: One level Home Equipment: Walker - 2 wheels;Walker - 4 wheels;Cane - quad Additional Comments: History from previous admission notes, not sure if still accurate.      Prior Function Level of Independence: Independent with assistive device(s)         Comments: used rollator mainly for gait (from previous admission note)        Extremity/Trunk Assessment   Upper Extremity Assessment: Overall WFL for tasks assessed           Lower Extremity Assessment: RLE deficits/detail;LLE  deficits/detail RLE Deficits / Details: Pt was able to move bil legs to EOB and go back to supine in bed on her own power.  She is at least 3-/5 per functional assessment.  Pt with wounds on bil feet indicating ischemia.  Pt seems more painful on the left leg which would be more consistant with pubic rami fractures.   LLE Deficits / Details: Pt was able to move bil legs to EOB and go back to supine in bed on her own power.  She is at least 3-/5 per functional assessment.  Pt with wounds on bil feet indicating ischemia.  Pt seems more painful on the left leg which would be more consistant with pubic rami fractures.     Cervical / Trunk Assessment: Normal  Communication   Communication: Expressive difficulties  Cognition Arousal/Alertness: Awake/alert Behavior During Therapy: Restless Overall Cognitive Status: Impaired/Different from baseline Area of Impairment: Following commands       Following Commands: Follows one step commands inconsistently       General Comments: Pt followed some, but not all commands, did respond to manual cues             Assessment/Plan    PT Assessment Patient needs continued PT services  PT Diagnosis Difficulty walking;Abnormality of gait;Generalized weakness;Altered mental status   PT Problem List Decreased strength;Decreased activity tolerance;Decreased mobility;Decreased balance;Decreased cognition;Decreased knowledge of use of DME;Cardiopulmonary status limiting activity;Pain  PT Treatment Interventions DME instruction;Gait training;Functional mobility training;Therapeutic activities;Therapeutic exercise;Balance training;Cognitive remediation;Neuromuscular re-education;Patient/family education   PT Goals (Current goals can be found in the Care Plan section) Acute Rehab PT Goals Patient Stated Goal: unable to state PT Goal Formulation: Patient unable to participate in goal setting Time For Goal Achievement: 01/23/16 Potential to Achieve Goals: Good    Frequency Min 3X/week   Barriers to discharge Decreased caregiver support from previous admission, pt lives alone and has intermittent help       End of Session   Activity Tolerance: Patient limited by pain;Treatment limited secondary to medical complications (Comment) (limited by high HR) Patient left: in bed;with call bell/phone within reach;with bed alarm set Nurse Communication: Other (comment) (high HR)         Time: 1610-9604 PT Time Calculation (min) (ACUTE ONLY): 9 min   Charges:   PT Evaluation $PT Eval Moderate Complexity: 1 Procedure          Athene Schuhmacher B. Carra Brindley, PT, DPT  [phone]   01/09/2016, 2:52 PM

## 2016-01-09 NOTE — Care Management Note (Signed)
Case Management Note  Patient Details  Name: [name] [name] MRN: [id number] Date of Birth: [birth year]  Subjective/Objective:     Pt admitted on 01/07/16 after falling out of bed and hitting head on nightstand.  Pt suffered SDH/SAH and pubic rami fracture.  PTA, pt independent, lives alone.                  Action/Plan: Will follow for discharge planning as pt progresses.  PT/OT recommending CIR.  Consult pending.    Expected Discharge Date:                  Expected Discharge Plan:  IP Rehab Facility  In-House Referral:     Discharge planning Services  CM Consult  Post Acute Care Choice:    Choice offered to:     DME Arranged:    DME Agency:     HH Arranged:    HH Agency:     Status of Service:  In process, will continue to follow  Medicare Important Message Given:    Date Medicare IM Given:    Medicare IM give by:    Date Additional Medicare IM Given:    Additional Medicare Important Message give by:     If discussed at Long Length of Stay Meetings, dates discussed:    Additional Comments:  Quintella Baton, RN, BSN  Trauma/Neuro ICU Case Manager [phone]

## 2016-01-10 ENCOUNTER — Inpatient Hospital Stay (HOSPITAL_COMMUNITY): Payer: Medicare Other

## 2016-01-10 DIAGNOSIS — G35 Multiple sclerosis: Secondary | ICD-10-CM

## 2016-01-10 DIAGNOSIS — S069X0S Unspecified intracranial injury without loss of consciousness, sequela: Secondary | ICD-10-CM

## 2016-01-10 LAB — BASIC METABOLIC PANEL
Anion gap: 12 (ref 5–15)
BUN: 21 mg/dL — ABNORMAL HIGH (ref 6–20)
CALCIUM: 8.4 mg/dL — AB (ref 8.9–10.3)
CHLORIDE: 110 mmol/L (ref 101–111)
CO2: 20 mmol/L — ABNORMAL LOW (ref 22–32)
CREATININE: 1.07 mg/dL — AB (ref 0.44–1.00)
GFR, EST NON AFRICAN AMERICAN: 52 mL/min — AB (ref >60)
Glucose, Bld: 118 mg/dL — ABNORMAL HIGH (ref 65–99)
Potassium: 4 mmol/L (ref 3.5–5.1)
SODIUM: 142 mmol/L (ref 135–145)

## 2016-01-10 LAB — GLUCOSE, CAPILLARY
GLUCOSE-CAPILLARY: 133 mg/dL — AB (ref 65–99)
GLUCOSE-CAPILLARY: 105 mg/dL — AB (ref 65–99)
GLUCOSE-CAPILLARY: 146 mg/dL — AB (ref 65–99)
GLUCOSE-CAPILLARY: 124 mg/dL — AB (ref 65–99)
GLUCOSE-CAPILLARY: 131 mg/dL — AB (ref 65–99)
Glucose-Capillary: 42 mg/dL — CL (ref 65–99)
Glucose-Capillary: 98 mg/dL (ref 65–99)

## 2016-01-10 LAB — CBC
HCT: 33.9 % — ABNORMAL LOW (ref 36.0–46.0)
HEMOGLOBIN: 10.8 g/dL — AB (ref 12.0–15.0)
MCH: 31.7 pg (ref 26.0–34.0)
MCHC: 31.9 g/dL (ref 30.0–36.0)
MCV: 99.4 fL (ref 78.0–100.0)
PLATELETS: 209 10*3/uL (ref 150–400)
RBC: 3.41 MIL/uL — ABNORMAL LOW (ref 3.87–5.11)
RDW: 14.1 % (ref 11.5–15.5)
WBC: 16.2 10*3/uL — ABNORMAL HIGH (ref 4.0–10.5)

## 2016-01-10 LAB — PROTIME-INR
INR: 1.36 (ref 0.00–1.49)
PROTHROMBIN TIME: 16.8 s — AB (ref 11.6–15.2)

## 2016-01-10 MED ORDER — SODIUM CHLORIDE 0.9 % IV SOLN
500.0000 mg | Freq: Two times a day (BID) | INTRAVENOUS | Status: DC
  Administered 2016-01-10: 500 mg via INTRAVENOUS
  Filled 2016-01-10 (×3): qty 5

## 2016-01-10 MED ORDER — INSULIN GLARGINE 100 UNIT/ML ~~LOC~~ SOLN
5.0000 [IU] | Freq: Every day | SUBCUTANEOUS | Status: DC
  Filled 2016-01-10: qty 0.05

## 2016-01-10 MED ORDER — LEVETIRACETAM 500 MG/5ML IV SOLN
1000.0000 mg | Freq: Two times a day (BID) | INTRAVENOUS | Status: DC
  Administered 2016-01-10: 1000 mg via INTRAVENOUS
  Filled 2016-01-10 (×2): qty 10

## 2016-01-10 MED ORDER — DEXTROSE 50 % IV SOLN
INTRAVENOUS | Status: AC
  Administered 2016-01-10: 25 mL
  Filled 2016-01-10: qty 50

## 2016-01-10 MED ORDER — CETYLPYRIDINIUM CHLORIDE 0.05 % MT LIQD
7.0000 mL | Freq: Two times a day (BID) | OROMUCOSAL | Status: DC
  Administered 2016-01-13 – 2016-01-16 (×5): 7 mL via OROMUCOSAL

## 2016-01-10 NOTE — Progress Notes (Signed)
~  0320: Pt HR increase. Pt making noises in room. In to assess pt; noted eyebrows,eyes, and mouth twitching rhythmically. Pt follows commands with blank stare. MD on floor, ask to view pt. MD in room, pt stops abnormal motion. Describe event to MD, gave SBAR report. Discussed meds. Noted HR to continue to be elevated, AFIB rhythm. Updated MD on previous days of HR issues. Ask pt "Are you better", pt nodds and mumbles "yeah" while reaching to touch other nurses hands.  Instructed to continue to monitor and contact MD on call for any other issues.

## 2016-01-10 NOTE — Progress Notes (Signed)
Patient ID: [name] [name], female   DOB: [birth year], 70 y.o.   MRN: [id number]   LOS: 3 days   Subjective: Noted events of last night, expressive aphasia now complete, possible receptive aphasia as well, cannot get pt to follow commands. Focal motor seizure observed by myself.   Objective: Vital signs in last 24 hours: Temp:  [97.6 F (36.4 C)-98.6 F (37 C)] 97.6 F (36.4 C) (05/05 0800) Pulse Rate:  [46-175] 63 (05/05 0900) Resp:  [10-37] 26 (05/05 0900) BP: (91-162)/(63-112) 105/81 mmHg (05/05 0900) SpO2:  [43 %-100 %] 98 % (05/05 0900) Last BM Date: 01/09/16   Laboratory  CBC  Recent Labs  01/09/16 0256 01/10/16 0300  WBC 13.1* 16.2*  HGB 11.7* 10.8*  HCT 35.9* 33.9*  PLT 247 209   BMET  Recent Labs  01/09/16 0256 01/10/16 0300  NA 138 142  K 4.1 4.0  CL 105 110  CO2 17* 20*  GLUCOSE 297* 118*  BUN 19 21*  CREATININE 1.43* 1.07*  CALCIUM 8.6* 8.4*    Radiology Results CT HEAD WITHOUT CONTRAST  TECHNIQUE: Contiguous axial images were obtained from the base of the skull through the vertex without intravenous contrast.  COMPARISON: CT HEAD Jan 07, 2016  FINDINGS: INTRACRANIAL CONTENTS: Similar 23 x 14 mm LEFT frontal intraparenchymal hematoma with surrounding low-density vasogenic edema. Decreasing LEFT subarachnoid hemorrhage and subdural hematoma: Previously measuring up to 12 mm, now 6 mm. No midline shift. Trace probable residual subdural and hematoma at jugular foramen. Trace, decreased RIGHT frontal subdural hematoma. Small area RIGHT parietal encephalomalacia again noted. No acute large vascular territory infarcts. Ventricles and sulci are overall normal for patient's age. Marked dural calcifications. Basal cisterns are patent.  ORBITS: The included ocular globes and orbital contents are non-suspicious.  SINUSES: The mastoid aircells and included paranasal sinuses are well-aerated.  SKULL/SOFT TISSUES: Small LEFT frontal  scalp hematoma. No skull fracture. 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 Extensive scalp calcifications consistent with history of kidney disease.  IMPRESSION: Decreasing bifrontal acute subdural hematomas, without significant mass effect and no residual midline shift. Decreasing LEFT subarachnoid hemorrhage. Minimal probable subdural hematoma at skull base.  Stable appearance of LEFT frontal intraparenchymal hematoma.  RIGHT parietal encephalomalacia most consistent with old RIGHT MCA territory infarct.   Electronically Signed  By: Awilda Metro M.D.  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 Neurologic status worse despite improving head CT. Start Keppra. Left pubic rami fxs -- WBAT per Dr. Eulah Pont Multiple medical problems -- per IM Left foot gangrenous toes -- Will ask ortho to see, may need amputation during this admission, suspect source of leukocytosis though could be steroid. FEN -- No issues VTE -- SCD's Dispo -- Continue ICU today    Freeman Caldron, PA-C Pager: [phone] General Trauma PA Pager: [phone]  01/10/2016

## 2016-01-10 NOTE — Progress Notes (Signed)
Spoke with Dr Derrell Lolling about pt twitching especially eyelids. MD will followup this am rounds.

## 2016-01-10 NOTE — Progress Notes (Signed)
Hypoglycemic Event  CBG: 42  Treatment: 25 ml D50  Symptoms: sleepy  Follow-up CBG: Time 2045 CBG=131  Possible Reasons for Event: poor appetite  Comments/MD notified:[place]    Alesia Banda

## 2016-01-10 NOTE — Care Management Important Message (Signed)
Important Message  Patient Details  Name: [name] [name] MRN: [id number] Date of Birth: 18-Nov-1945   Medicare Important Message Given:  Yes    Kyla Balzarine 01/10/2016, 3:03 PM

## 2016-01-10 NOTE — Evaluation (Signed)
Speech Language Pathology Evaluation Patient Details Name: [name] [name] MRN: [id number] DOB: [birth year] Today's Date: 01/10/2016 Time: 1430-1500 SLP Time Calculation (min) (ACUTE ONLY): 30 min  Problem List:  Patient Active Problem List   Diagnosis Date Noted  . Anticoagulated on Coumadin   . Subdural hematoma (HCC)   . Chronic atrial fibrillation (HCC)   . IDDM (insulin dependent diabetes mellitus) (HCC)   . History of renal transplant   . PVD (peripheral vascular disease) (HCC)   . Ischemic ulcer of both feet (HCC)   . SDH (subdural hematoma) (HCC) 01/07/2016  . Type II diabetes mellitus (HCC) 11/23/2015  . Hypoglycemia 11/23/2015  . Critical lower limb ischemia 11/20/2015  . Encounter for therapeutic drug monitoring 10/06/2013  . Colon cancer screening 06/01/2012  . 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 PRIMARY HYPERPARATHYROIDISM 08/06/2010  . ANEMIA 08/06/2010  . DEPRESSION 08/06/2010  . Mitral stenosis 08/06/2010  . Atrial fibrillation (HCC) 08/06/2010  . 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 DIASTOLIC DYSFUNCTION 08/06/2010  . Cerebral artery occlusion with cerebral infarction (HCC) 08/06/2010  . PVD 08/06/2010  . RENAL FAILURE, END STAGE 08/06/2010  . PACEMAKER, PERMANENT 08/06/2010   Past Medical History:  Past Medical History  Diagnosis Date  . Hypertension   . Stroke [facility]) December 2011    left MCA distribution  . Permanent atrial fibrillation (HCC)     coumadin clinic at [facility]. Has previously required Lovenox->Coumadin bridge.  . Severe mitral stenosis by prior echocardiogram     last echo 07/2012  . Aortic stenosis   . Symptomatic bradycardia     s/p pacer  . Depression   . HSV (herpes simplex virus) infection   . Anemia   . Hyperparathyroidism, primary (HCC)     s/p parathyroidectomy  . PAD (peripheral artery disease) (HCC)     a. s/p RLE angioplasty with [name]. b. (by [name])  L popliteral artery orbital atherectomy and balloon  angioplasty & L SFA orbital atherectomy and balloon angioplasty 11/30/12.  [name] PVD (peripheral vascular disease) (HCC)   . Presence of permanent cardiac pacemaker   . High cholesterol   . CHF (congestive heart failure) (HCC)   . TIA (transient ischemic attack) 08/2009    [name] 09/05/2009  . Atrial thrombus (HCC)     appendage/notes 09/05/2009  . Type II diabetes mellitus (HCC)   . History of blood transfusion     related to "kidney transplant"  . Chronic kidney disease     s/p renal transplant;  follows with [name]  . GERD (gastroesophageal reflux disease)     hx  . History of stomach ulcers   . Diabetic foot ulcer (HCC)     "left is worse than right" (11/20/2015)   Past Surgical History:  Past Surgical History  Procedure Laterality Date  . Insert / replace / remove pacemaker    . Kidney transplant  December 1999  . Abdominal angiogram  11/30/2012    ABDOMINAL AORTIC ANGIOGRAM   DR ARDIA  . Dialysis fistula creation    . Abdominal aortagram N/A 11/30/2012    Procedure: ABDOMINAL [name];  Surgeon: [name], MD;  Location: [facility] CATH LAB;  Service: Cardiovascular;  Laterality: N/A;  . Peripheral vascular catheterization N/A 11/20/2015    Procedure: Abdominal Aortogram;  Surgeon: [name], MD;  Location: [facility] INVASIVE CV LAB;  Service: Cardiovascular;  Laterality: N/A;  . Peripheral vascular catheterization Bilateral 11/20/2015    Procedure: Lower Extremity Angiography;  Surgeon: [name],  MD;  Location: [facility] INVASIVE CV LAB;  Service: Cardiovascular;  Laterality: Bilateral;  . Peripheral vascular catheterization  11/20/2015    Procedure: Peripheral Vascular Balloon Angioplasty;  Surgeon: [name], MD;  Location: [facility] INVASIVE CV LAB;  Service: Cardiovascular;;  . Tonsillectomy    . Appendectomy    . Hernia repair    . Umbilical hernia repair     HPI:  70 y.o. female admitted after falling out of bed and sustaining SDH/SAH, left inf/sup rami  fractures.  CCT small left convexity SDH, left SAH; aphasia. 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    Assessment / Plan / Recommendation Clinical Impression  Patient presents with a severe expressive-receptive aphasia and is non-verbal at this time. Upon entering room, patient looked in direction of SLP and was puffing out from her mouth. She ceased this behavior after 10 seconds or so.Patient did attend to clinician, imitated to point to nose and open mouth but was not able to do so with only a verbal command. Patient did not make any attempts to gesture or communicate in any way with SLP. She would moan/cry and reach for her foot due to pain (RN stated she is to  have some toe amputations due to necrosis). She was able to feed her self when SLP presented her meal tray, and handed spoon or cup to her. She was impulsive with self-feeding but did fully clear oral cavity without need for suctioning, etc. Patient did respond "uh huh" when SLP asked her if she was done eating, however she did not verbally or non-verbally respond in any way other than this one time. Patient did appear to be somewhat tired and pain symptoms did impact her attention overall, however she exhibits a severe global aphasia.    SLP Assessment       Follow Up Recommendations  Skilled Nursing facility;24 hour supervision/assistance    Frequency and Duration min 3x week         SLP Evaluation Prior Functioning  Cognitive/Linguistic Baseline: Information not available Type of Home: House Available Help at Discharge: Family;Available PRN/intermittently   Cognition  Overall Cognitive Status: Impaired/Different from baseline Arousal/Alertness: Awake/alert Orientation Level: Disoriented X4 Attention: Focused;Sustained Focused Attention: Appears intact Sustained Attention: Impaired Sustained  Attention Impairment: Functional basic Memory:  (unable to assess, patient non-verbal) Awareness: Impaired Awareness Impairment: Intellectual impairment Problem Solving: Impaired Problem Solving Impairment: Functional basic Executive Function: Self Monitoring Self Monitoring: Impaired Self Monitoring Impairment: Functional basic Behaviors: Impulsive    Comprehension  Auditory Comprehension Overall Auditory Comprehension: Impaired Yes/No Questions: Impaired Basic Biographical Questions: 0-25% accurate Basic Immediate Environment Questions: 0-24% accurate Commands: Impaired One Step Basic Commands: 0-24% accurate Interfering Components: Attention;Processing speed;Pain EffectiveTechniques: Extra processing time;Visual/Gestural cues;Repetition    Expression Expression Primary Mode of Expression: Other (comment) (non-verbal and no attempts to gesture or communicate) Verbal Expression Overall Verbal Expression: Impaired Initiation: Impaired Other Verbal Expression Comments: Patient was able to imitate some gestures to point to nose, open mouth and nodded head and said, "uh huh" once when SLP asked her if she was done eating.   Oral / Motor  Oral Motor/Sensory Function Overall Oral Motor/Sensory Function: Other (comment) (appears WFL, however difficult to assess secondary to severe receptive and expressive aphasia)   GO  Elio Forget Tarrell 01/10/2016, 4:24 PM    Angela Nevin, MA, CCC-SLP 01/10/2016 4:24 PM

## 2016-01-10 NOTE — Progress Notes (Signed)
Patient seen and examined. Case d/w residents in detail. I agree with findings and plan as documented in Dr. Bo Merino note.  Patient now with expressive and possibly some receptive aphasia. Does not follow commands. Repeat CT scan with decreasing bifrontal subdural hematomas and decreasing SAH. No acute surgical intervention for now per surgery. Patient started on Keppra for possible seizure activity.  BP well controlled on labetolol prn. Blood sugars are well controlled as well. Will decrease lantus to 5 units given low of 72.

## 2016-01-10 NOTE — Consult Note (Signed)
ORTHOPAEDIC CONSULTATION  REQUESTING PHYSICIAN: Trauma Md, MD  Chief Complaint: Dry gangrene left foot  HPI: [name] [name] is a 70 y.o. female who presents with dry gangrene left foot. Patient most recently fell out of bed striking her head on the bedside table. Patient developed a subdural hematoma. Patient had been scheduled for bilateral lower extremity toe amputations in [facility] and she is seen today in consultation for evaluation of foot salvage.  Past Medical History  Diagnosis Date  . Hypertension   . Stroke [facility]) December 2011    left MCA distribution  . Permanent atrial fibrillation (HCC)     coumadin clinic at [facility]. Has previously required Lovenox->Coumadin bridge.  . Severe mitral stenosis by prior echocardiogram     last echo 07/2012  . Aortic stenosis   . Symptomatic bradycardia     s/p pacer  . Depression   . HSV (herpes simplex virus) infection   . Anemia   . Hyperparathyroidism, primary (HCC)     s/p parathyroidectomy  . PAD (peripheral artery disease) (HCC)     a. s/p RLE angioplasty with [name]. b. (by [name])  L popliteral artery orbital atherectomy and balloon angioplasty & L SFA orbital atherectomy and balloon angioplasty 11/30/12.  [name] PVD (peripheral vascular disease) (HCC)   . Presence of permanent cardiac pacemaker   . High cholesterol   . CHF (congestive heart failure) (HCC)   . TIA (transient ischemic attack) 08/2009    [name] 09/05/2009  . Atrial thrombus (HCC)     appendage/notes 09/05/2009  . Type II diabetes mellitus (HCC)   . History of blood transfusion     related to "kidney transplant"  . Chronic kidney disease     s/p renal transplant;  follows with [name]  . GERD (gastroesophageal reflux disease)     hx  . History of stomach ulcers   . Diabetic foot ulcer (HCC)     "left is worse than right" (11/20/2015)   Past Surgical History  Procedure Laterality Date  . Insert / replace / remove  pacemaker    . Kidney transplant  December 1999  . Abdominal angiogram  11/30/2012    ABDOMINAL AORTIC ANGIOGRAM   DR ARDIA  . Dialysis fistula creation    . Abdominal aortagram N/A 11/30/2012    Procedure: ABDOMINAL [name];  Surgeon: [name], MD;  Location: [facility] CATH LAB;  Service: Cardiovascular;  Laterality: N/A;  . Peripheral vascular catheterization N/A 11/20/2015    Procedure: Abdominal Aortogram;  Surgeon: [name], MD;  Location: [facility] INVASIVE CV LAB;  Service: Cardiovascular;  Laterality: N/A;  . Peripheral vascular catheterization Bilateral 11/20/2015    Procedure: Lower Extremity Angiography;  Surgeon: [name], MD;  Location: [facility] INVASIVE CV LAB;  Service: Cardiovascular;  Laterality: Bilateral;  . Peripheral vascular catheterization  11/20/2015    Procedure: Peripheral Vascular Balloon Angioplasty;  Surgeon: [name], MD;  Location: [facility] INVASIVE CV LAB;  Service: Cardiovascular;;  . Tonsillectomy    . Appendectomy    . Hernia repair    . Umbilical hernia repair     Social History   Social History  . Marital Status: Legally Separated    Spouse Name: N/A  . Number of Children: N/A  . Years of Education: N/A   Social History Main Topics  . Smoking status: Former Smoker -- 0.25 packs/day for 10 years    Types: Cigarettes  . Smokeless tobacco: Never Used  Comment: "quit smoking cigarettes in the 1990s"  . Alcohol Use: No  . Drug Use: No  . Sexual Activity: No   Other Topics Concern  . None   Social History Narrative   Family History  Problem Relation Age of Onset  . Adopted: Yes  . Kidney disease Brother     died from  . Kidney disease Sister     died from  . Heart attack Neg Hx   . Stroke Neg Hx    - negative except otherwise stated in the family history section Allergies  Allergen Reactions  . Bactrim [Sulfamethoxazole-Trimethoprim] Itching  . Aspirin Nausea Only    GI upset  . Shellfish Allergy Hives    Also nausea  .  Codeine Hives and Nausea Only  . Latex Rash  . Penicillins Nausea And Vomiting and Rash    Has patient had a PCN reaction causing immediate rash, facial/tongue/throat swelling, SOB or lightheadedness with hypotension:No Has patient had a PCN reaction causing severe rash involving mucus membranes or skin necrosis:No Has patient had a PCN reaction that required hospitalization:No Has patient had a PCN reaction occurring within the last 10 years:cannot recall If all of the above answers are "NO", then may proceed with Cephalosporin use.   . Tape Itching and Rash    Please use "paper" tape   Prior to Admission medications   Medication Sig Start Date End Date Taking? Authorizing Provider  acetaminophen (TYLENOL) 500 MG tablet Take 500 mg by mouth every 6 (six) hours as needed. For pain   Yes Historical Provider, MD  amLODipine (NORVASC) 5 MG tablet Take 1 tablet (5 mg total) by mouth daily. 10/08/15  Yes [name], MD  azelastine (OPTIVAR) 0.05 % ophthalmic solution Place 1 drop into both eyes 2 (two) times daily as needed (for dry eyes).    Yes Historical Provider, MD  b complex-vitamin c-folic acid (NEPHRO-VITE) 0.8 MG TABS Take 1 tablet by mouth daily.    Yes Historical Provider, MD  Calcium Carbonate-Vitamin D (CALTRATE 600+D) 600-400 MG-UNIT per tablet Take 1 tablet by mouth 2 (two) times daily.    Yes Historical Provider, MD  clopidogrel (PLAVIX) 75 MG tablet Take 1 tablet (75 mg total) by mouth daily. 11/12/15 11/11/16 Yes [name], MD  cycloSPORINE modified (NEORAL/GENGRAF) 25 MG capsule Take 25 mg by mouth 2 (two) times daily.     Yes Historical Provider, MD  digoxin (LANOXIN) 0.125 MG tablet Take 0.125 mg by mouth daily.  05/27/11  Yes Historical Provider, MD  ergocalciferol (VITAMIN D2) 50000 UNITS capsule Take 50,000 Units by mouth every 30 (thirty) days. First of the month   Yes Historical Provider, MD  furosemide (LASIX) 20 MG tablet TAKE 1 TABLET BY MOUTH EVERY OTHER DAY  10/04/15  Yes [name], MD  iron polysaccharides (NIFEREX) 150 MG capsule Take 150 mg by mouth daily.  06/03/07  Yes Historical Provider, MD  JANUVIA 50 MG tablet Take 1 tablet by mouth 2 (two) times daily.  11/25/15  Yes Historical Provider, MD  LEVEMIR FLEXTOUCH 100 UNIT/ML Pen Inject 10 Units as directed at bedtime.  11/12/15  Yes Historical Provider, MD  lidocaine (XYLOCAINE) 5 % ointment Apply 1 application topically as needed. For foot pain 11/04/15  Yes Asencion Islam, DPM  metoprolol (LOPRESSOR) 100 MG tablet TAKE 1 TABLET BY MOUTH TWICE A DAY 12/09/15  Yes [name], MD  Multiple Vitamins-Minerals (ONE-A-DAY WOMENS 50 PLUS PO) Take 1 tablet by mouth daily.   Yes  Historical Provider, MD  mycophenolate (CELLCEPT) 250 MG capsule Take 500 mg by mouth 2 (two) times daily.    Yes Historical Provider, MD  ONE TOUCH ULTRA TEST test strip TEST BLOOD SUGAR 3 TIMES A DAY (E11.9) 08/15/15  Yes Historical Provider, MD  pravastatin (PRAVACHOL) 20 MG tablet Take 1 tablet (20 mg total) by mouth every evening. 11/25/15  Yes Kristin L Alvstad, RPH-CPP  predniSONE (DELTASONE) 5 MG tablet Take 5 mg by mouth daily.     Yes Historical Provider, MD  traMADol (ULTRAM) 50 MG tablet Take 1 tablet (50 mg total) by mouth every 8 (eight) hours as needed for moderate pain. 12/31/15  Yes Asencion Islam, DPM  valACYclovir (VALTREX) 500 MG tablet Take 500 mg by mouth every Monday, Wednesday, and Friday. Only on Monday, Wednesday and Friday   Yes Historical Provider, MD  warfarin (COUMADIN) 2.5 MG tablet 1 Tab daily M,W,Th,F,Sun; 1/2 tab daily Tues/Sat. Patient taking differently: Take 2.5 mg by mouth every afternoon on Sun/Mon/Wed/Thurs/Fri and 1.25 mg on Tues/Sat 11/23/15  Yes Ok Anis, NP   Ct Head Wo Contrast  01/10/2016  CLINICAL DATA:  Followup subdural hematoma. History of TIA and diabetes, kidney disease. EXAM: CT HEAD WITHOUT CONTRAST TECHNIQUE: Contiguous axial images were obtained from the base of the  skull through the vertex without intravenous contrast. COMPARISON:  CT HEAD Jan 07, 2016 FINDINGS: INTRACRANIAL CONTENTS: Similar 23 x 14 mm LEFT frontal intraparenchymal hematoma with surrounding low-density vasogenic edema. Decreasing LEFT subarachnoid hemorrhage and subdural hematoma: Previously measuring up to 12 mm, now 6 mm. No midline shift. Trace probable residual subdural and hematoma at jugular foramen. Trace, decreased RIGHT frontal subdural hematoma. Small area RIGHT parietal encephalomalacia again noted. No acute large vascular territory infarcts. Ventricles and sulci are overall normal for patient's age. Marked dural calcifications. Basal cisterns are patent. ORBITS: The included ocular globes and orbital contents are non-suspicious. SINUSES: The mastoid aircells and included paranasal sinuses are well-aerated. SKULL/SOFT TISSUES: Small LEFT frontal scalp hematoma. No skull fracture. 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 Extensive scalp calcifications consistent with history of kidney disease. IMPRESSION: Decreasing bifrontal acute subdural hematomas, without significant mass effect and no residual midline shift. Decreasing LEFT subarachnoid hemorrhage. Minimal probable subdural hematoma at skull base. Stable appearance of LEFT frontal intraparenchymal hematoma. RIGHT parietal encephalomalacia most consistent with old RIGHT MCA territory infarct. Electronically Signed   By: Awilda Metro M.D.   On: 01/10/2016 05:53   - pertinent xrays, CT, MRI studies were reviewed and independently interpreted  Positive ROS: All other systems have been reviewed and were otherwise negative with the exception of those mentioned in the HPI and as above.  Physical Exam: General: Patient is not alert or oriented. She cannot respond to questions. Cardiovascular: No pedal edema Respiratory: No cyanosis, no use of accessory  musculature GI: No organomegaly, abdomen is soft and non-tender Skin: Patient has dry gangrenous changes of the toes of the left foot. Her foot is cold ischemic and atrophic. There are no open ulcers on the heel. Patient does not have a palpable pulse. Right lower extremity has thin atrophic skin but no dry gangrenous changes. Neurologic: No protective sensation Psychiatric: Patient is unable to respond to questions she is not oriented. Lymphatic: No axillary or cervical lymphadenopathy  MUSCULOSKELETAL:  On examination patient does not have a palpable dorsalis pedis pulse either lower extremity. Patient has thin atrophic skin in both legs. Her left  forefoot is cold to the touch with dry gangrenous changes no cellulitis no drainage no odor no signs of infection. 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  Plan: Plan: Patient's only surgical option would be a left below the knee amputation. A mid foot amputation would not heal due to her severe peripheral vascular disease. Patient is not septic from the dry gangrene to her feet and surgical intervention could be proceeded with electively once patient has recovered from her subdural hematoma. I will follow-up as needed. Patient does have a podiatrist who actively follows her and does have established orthopedic care at [facility]  Thank you for the consult and the opportunity to see Ms. Chauncey Reading, MD [facility] [phone] 4:39 PM

## 2016-01-10 NOTE — Clinical Documentation Improvement (Signed)
Internal Medicine Teaching Service  Please document query responses in the progress notes and discharge summary. Do not document query responses on the CDI BPA form. Thank you.  Query 1 of 2  Please document if a condition below provides greater specificity regarding the patient's renal function this admission:  - AKI on Stage 3 CKD, including any associated causes and/or conditions  - Other condition  - Unable to clinically determine  Clinical information: 0.40 mg/dL increase in Creatinine from 01/07/16 to 01/08/16 0.45 md/dL increase in Creatinine from 01/07/16 to 01/09/16 BUN/Cr/GFR trend this admission    (black female) Component     Latest Ref Rng 01/07/2016 01/07/2016 01/08/2016 01/09/2016 01/10/2016         4:36 PM  9:45 PM     BUN     6 - 20 mg/dL 17 14 18 19 21  (H)  Creatinine     0.44 - 1.00 mg/dL 0.98 0.94 1.38 (H) 1.43 (H) 1.07 (H)  EGFR (African American)     >60 mL/min >60 >60 45 (L) 43 (L) >60    Query 2 of 2 "CHF" is documented in the current medical record.  If know or able to determine, please document the ACUITY and TYPE of the patient's CHF this admission.  - Acute  - Acute on Chronic  - Chronic  - Other Acuity  - Unable to clinically determine  AND    - Systolic  - Diastolic  - Combined  - Other type  - Unable to clinically determine  Please exercise your independent, professional judgment when responding. A specific answer is not anticipated or expected.   Thank You, Erling Conte  RN BSN [facility]

## 2016-01-10 NOTE — Consult Note (Signed)
Physical Medicine and Rehabilitation Consult  Reason for Consult: TBI and left pelvic fractures Referring Physician: Dr. Lindie Spruce.    HPI: [name] [name] is a 70 y.o. female with history fo HTN, CVA, severe MS and symptomatic AS, T2DM, CKD,  PAF-on coumadin, gait disorder,  PAD with severe claudication LLE and bilateral foot ulcers (with plans for partial foot amputation next week) who was admitted on 01/07/16 due to fall out of bed--she struck her head and EMS called but patient refused to go to the hospital till later that evening due to inability to get out of bed.  She was noted to have multiple contusion on right side and  Minimally displaced left superior and inferior pubic rami with possible extension into left acetabulum. CT head showed moderate left frontoparietal SDH with  3 mm L to R shift and small SAH in left parietal lobe. She was evaluated by Dr. Jordan Likes who recommended reversal of coumadin and serial CT for monitoring. Dr. Renaye Rakers recommended WBAT on LLE.  Follow up CCT showed ncrease in bleed and edema and she did have worsening of expressive aphasia as well question focal motor seizure activity. CT head today showing decrease in bifrontal acute SDH, stable left frontal parenchymal hematoma and no residual midline shift.  PT evaluation done yesterday and CIR recommended for follow up therapy.    Review of Systems  Unable to perform ROS: language      Past Medical History  Diagnosis Date  . Hypertension   . Stroke [facility]) December 2011    left MCA distribution  . Permanent atrial fibrillation (HCC)     coumadin clinic at [facility]. Has previously required Lovenox->Coumadin bridge.  . Severe mitral stenosis by prior echocardiogram     last echo 07/2012  . Aortic stenosis   . Symptomatic bradycardia     s/p pacer  . Depression   . HSV (herpes simplex virus) infection   . Anemia   . Hyperparathyroidism, primary (HCC)     s/p parathyroidectomy  . PAD  (peripheral artery disease) (HCC)     a. s/p RLE angioplasty with [name]. b. (by [name])  L popliteral artery orbital atherectomy and balloon angioplasty & L SFA orbital atherectomy and balloon angioplasty 11/30/12.  [name] PVD (peripheral vascular disease) (HCC)   . Presence of permanent cardiac pacemaker   . High cholesterol   . CHF (congestive heart failure) (HCC)   . TIA (transient ischemic attack) 08/2009    [name] 09/05/2009  . Atrial thrombus (HCC)     appendage/notes 09/05/2009  . Type II diabetes mellitus (HCC)   . History of blood transfusion     related to "kidney transplant"  . Chronic kidney disease     s/p renal transplant;  follows with [name]  . GERD (gastroesophageal reflux disease)     hx  . History of stomach ulcers   . Diabetic foot ulcer (HCC)     "left is worse than right" (11/20/2015)    Past Surgical History  Procedure Laterality Date  . Insert / replace / remove pacemaker    . Kidney transplant  December 1999  . Abdominal angiogram  11/30/2012    ABDOMINAL AORTIC ANGIOGRAM   DR ARDIA  . Dialysis fistula creation    . Abdominal aortagram N/A 11/30/2012    Procedure: ABDOMINAL [name];  Surgeon: [name], MD;  Location: [facility] CATH LAB;  Service: Cardiovascular;  Laterality: N/A;  . Peripheral vascular  catheterization N/A 11/20/2015    Procedure: Abdominal Aortogram;  Surgeon: [name], MD;  Location: [facility] INVASIVE CV LAB;  Service: Cardiovascular;  Laterality: N/A;  . Peripheral vascular catheterization Bilateral 11/20/2015    Procedure: Lower Extremity Angiography;  Surgeon: [name], MD;  Location: [facility] INVASIVE CV LAB;  Service: Cardiovascular;  Laterality: Bilateral;  . Peripheral vascular catheterization  11/20/2015    Procedure: Peripheral Vascular Balloon Angioplasty;  Surgeon: [name], MD;  Location: [facility] INVASIVE CV LAB;  Service: Cardiovascular;;  . Tonsillectomy    . Appendectomy    . Hernia repair    .  Umbilical hernia repair      Family History  Problem Relation Age of Onset  . Adopted: Yes  . Kidney disease Brother     died from  . Kidney disease Sister     died from  . Heart attack Neg Hx   . Stroke Neg Hx     Social History:  Lives alone. Per reports that she has quit smoking. Her smoking use included Cigarettes. She has a 2.5 pack-year smoking history. She has never used smokeless tobacco. Per  reports that she does not drink alcohol or use illicit drugs.   Allergies  Allergen Reactions  . Bactrim [Sulfamethoxazole-Trimethoprim] Itching  . Aspirin Nausea Only    GI upset  . Shellfish Allergy Hives    Also nausea  . Codeine Hives and Nausea Only  . Latex Rash  . Penicillins Nausea And Vomiting and Rash    Has patient had a PCN reaction causing immediate rash, facial/tongue/throat swelling, SOB or lightheadedness with hypotension:No Has patient had a PCN reaction causing severe rash involving mucus membranes or skin necrosis:No Has patient had a PCN reaction that required hospitalization:No Has patient had a PCN reaction occurring within the last 10 years:cannot recall If all of the above answers are "NO", then may proceed with Cephalosporin use.   . Tape Itching and Rash    Please use "paper" tape    Medications Prior to Admission  Medication Sig Dispense Refill  . acetaminophen (TYLENOL) 500 MG tablet Take 500 mg by mouth every 6 (six) hours as needed. For pain    . amLODipine (NORVASC) 5 MG tablet Take 1 tablet (5 mg total) by mouth daily. 90 tablet 0  . azelastine (OPTIVAR) 0.05 % ophthalmic solution Place 1 drop into both eyes 2 (two) times daily as needed (for dry eyes).     [name] b complex-vitamin c-folic acid (NEPHRO-VITE) 0.8 MG TABS Take 1 tablet by mouth daily.     . Calcium Carbonate-Vitamin D (CALTRATE 600+D) 600-400 MG-UNIT per tablet Take 1 tablet by mouth 2 (two) times daily.     . clopidogrel (PLAVIX) 75 MG tablet Take 1 tablet (75 mg total) by mouth  daily. 30 tablet 11  . cycloSPORINE modified (NEORAL/GENGRAF) 25 MG capsule Take 25 mg by mouth 2 (two) times daily.      . digoxin (LANOXIN) 0.125 MG tablet Take 0.125 mg by mouth daily.     . ergocalciferol (VITAMIN D2) 50000 UNITS capsule Take 50,000 Units by mouth every 30 (thirty) days. First of the month    . furosemide (LASIX) 20 MG tablet TAKE 1 TABLET BY MOUTH EVERY OTHER DAY 30 tablet 0  . iron polysaccharides (NIFEREX) 150 MG capsule Take 150 mg by mouth daily.     [name] JANUVIA 50 MG tablet Take 1 tablet by mouth 2 (two) times daily.     [name] LEVEMIR  FLEXTOUCH 100 UNIT/ML Pen Inject 10 Units as directed at bedtime.     . lidocaine (XYLOCAINE) 5 % ointment Apply 1 application topically as needed. For foot pain 35.44 g 0  . metoprolol (LOPRESSOR) 100 MG tablet TAKE 1 TABLET BY MOUTH TWICE A DAY 60 tablet 6  . Multiple Vitamins-Minerals (ONE-A-DAY WOMENS 50 PLUS PO) Take 1 tablet by mouth daily.    . mycophenolate (CELLCEPT) 250 MG capsule Take 500 mg by mouth 2 (two) times daily.     . ONE TOUCH ULTRA TEST test strip TEST BLOOD SUGAR 3 TIMES A DAY (E11.9)  3  . pravastatin (PRAVACHOL) 20 MG tablet Take 1 tablet (20 mg total) by mouth every evening. 90 tablet 3  . predniSONE (DELTASONE) 5 MG tablet Take 5 mg by mouth daily.      . traMADol (ULTRAM) 50 MG tablet Take 1 tablet (50 mg total) by mouth every 8 (eight) hours as needed for moderate pain. 60 tablet 0  . valACYclovir (VALTREX) 500 MG tablet Take 500 mg by mouth every Monday, Wednesday, and Friday. Only on Monday, Wednesday and Friday    . warfarin (COUMADIN) 2.5 MG tablet 1 Tab daily M,W,Th,F,Sun; 1/2 tab daily Tues/Sat. (Patient taking differently: Take 2.5 mg by mouth every afternoon on Sun/Mon/Wed/Thurs/Fri and 1.25 mg on Tues/Sat) 30 tablet 3    Home: Home Living Family/patient expects to be discharged to:: Private residence Living Arrangements: Alone Available Help at Discharge: Family, Available PRN/intermittently Type of  Home: House Home Access: Level entry Home Layout: One level Bathroom Toilet: Standard Bathroom Accessibility: No Home Equipment: Environmental consultant - 2 wheels, Walker - 4 wheels, Cane - quad Additional Comments: History from previous admission notes, not sure if still accurate.    Functional History: Prior Function Level of Independence: Independent with assistive device(s) Comments: used rollator mainly for gait (from previous admission note) Functional Status:  Mobility: Bed Mobility Overal bed mobility: Needs Assistance Bed Mobility: Supine to Sit, Sit to Supine Supine to sit: Min guard Sit to supine: Min guard General bed mobility comments: Min guard assist to manually facilitate moving EOB.  Pt sat up quickly and returned to supine quickly.   Transfers General transfer comment: NT as pt's HR ranged from 112 to 175 max observed and would go up and down in A-fib throughout just going EOB.       ADL:    Cognition: Cognition Overall Cognitive Status: Impaired/Different from baseline Orientation Level: Other (comment) (expressive aphasia) Cognition Arousal/Alertness: Awake/alert Behavior During Therapy: Restless Overall Cognitive Status: Impaired/Different from baseline Area of Impairment: Following commands Following Commands: Follows one step commands inconsistently General Comments: Pt followed some, but not all commands, did respond to manual cues Difficult to assess due to: Impaired communication   Blood pressure 105/81, pulse 63, temperature 97.6 F (36.4 C), temperature source Oral, resp. rate 26, height 5\' 2"  (1.575 m), weight 36.2 kg (79 lb 12.9 oz), SpO2 98 %. Physical Exam  Nursing note and vitals reviewed. Constitutional: She appears well-developed. She appears cachectic.  Moaning with fluttering movements of eye lids and lips.   HENT:  Head: Normocephalic and atraumatic.  Eyes: Conjunctivae are normal. Pupils are equal, round, and reactive to light.  Neck: Normal  range of motion. Neck supple.  Cardiovascular: An irregular rhythm present. Tachycardia present.   Murmur heard. Heart rate in 130- 180 at times.  Respiratory: Effort normal and breath sounds normal. No respiratory distress. She has no wheezes.  GI: Soft. Bowel sounds are normal.  She exhibits no distension. There is no tenderness.  Musculoskeletal: She exhibits no edema.  Dry gangrene multiple toes on left foot with purple fluctuant blister left heel.   Neurological: She is alert. A cranial nerve deficit is present.  Does not blink to threat in right lateral field and tends to stare straight ahead. Anxious and moaning with puffing movements of lips. Unable to state name. She was able to follow occasional simple motor commands with tactile cues. Noted to have global aphasia.   Skin: Skin is warm and dry.  Psychiatric: Her mood appears anxious. She is agitated. Cognition and memory are impaired. She is inattentive.    Results for orders placed or performed during the hospital encounter of 01/07/16 (from the past 24 hour(s))  Glucose, capillary     Status: Abnormal   Collection Time: 01/09/16 11:55 AM  Result Value Ref Range   Glucose-Capillary 229 (H) 65 - 99 mg/dL  Glucose, capillary     Status: Abnormal   Collection Time: 01/09/16  3:30 PM  Result Value Ref Range   Glucose-Capillary 278 (H) 65 - 99 mg/dL  Glucose, capillary     Status: None   Collection Time: 01/09/16  7:20 PM  Result Value Ref Range   Glucose-Capillary 72 65 - 99 mg/dL  Glucose, capillary     Status: Abnormal   Collection Time: 01/09/16 11:19 PM  Result Value Ref Range   Glucose-Capillary 151 (H) 65 - 99 mg/dL  Protime-INR     Status: Abnormal   Collection Time: 01/10/16  3:00 AM  Result Value Ref Range   Prothrombin Time 16.8 (H) 11.6 - 15.2 seconds   INR 1.36 0.00 - 1.49  CBC     Status: Abnormal   Collection Time: 01/10/16  3:00 AM  Result Value Ref Range   WBC 16.2 (H) 4.0 - 10.5 K/uL   RBC 3.41 (L) 3.87  - 5.11 MIL/uL   Hemoglobin 10.8 (L) 12.0 - 15.0 g/dL   HCT 52.8 (L) 41.3 - 24.4 %   MCV 99.4 78.0 - 100.0 fL   MCH 31.7 26.0 - 34.0 pg   MCHC 31.9 30.0 - 36.0 g/dL   RDW 01.0 27.2 - 53.6 %   Platelets 209 150 - 400 K/uL  Basic metabolic panel     Status: Abnormal   Collection Time: 01/10/16  3:00 AM  Result Value Ref Range   Sodium 142 135 - 145 mmol/L   Potassium 4.0 3.5 - 5.1 mmol/L   Chloride 110 101 - 111 mmol/L   CO2 20 (L) 22 - 32 mmol/L   Glucose, Bld 118 (H) 65 - 99 mg/dL   BUN 21 (H) 6 - 20 mg/dL   Creatinine, Ser 6.44 (H) 0.44 - 1.00 mg/dL   Calcium 8.4 (L) 8.9 - 10.3 mg/dL   GFR calc non Af Amer 52 (L) >60 mL/min   GFR calc Af Amer >60 >60 mL/min   Anion gap 12 5 - 15  Glucose, capillary     Status: Abnormal   Collection Time: 01/10/16  3:12 AM  Result Value Ref Range   Glucose-Capillary 133 (H) 65 - 99 mg/dL  Glucose, capillary     Status: Abnormal   Collection Time: 01/10/16  8:19 AM  Result Value Ref Range   Glucose-Capillary 105 (H) 65 - 99 mg/dL   Ct Head Wo Contrast  01/10/2016  CLINICAL DATA:  Followup subdural hematoma. History of TIA and diabetes, kidney disease. EXAM: CT HEAD WITHOUT CONTRAST TECHNIQUE: Contiguous  axial images were obtained from the base of the skull through the vertex without intravenous contrast. COMPARISON:  CT HEAD Jan 07, 2016 FINDINGS: INTRACRANIAL CONTENTS: Similar 23 x 14 mm LEFT frontal intraparenchymal hematoma with surrounding low-density vasogenic edema. Decreasing LEFT subarachnoid hemorrhage and subdural hematoma: Previously measuring up to 12 mm, now 6 mm. No midline shift. Trace probable residual subdural and hematoma at jugular foramen. Trace, decreased RIGHT frontal subdural hematoma. Small area RIGHT parietal encephalomalacia again noted. No acute large vascular territory infarcts. Ventricles and sulci are overall normal for patient's age. Marked dural calcifications. Basal cisterns are patent. ORBITS: The included ocular globes  and orbital contents are non-suspicious. SINUSES: The mastoid aircells and included paranasal sinuses are well-aerated. SKULL/SOFT TISSUES: Small LEFT frontal scalp hematoma. No skull fracture. 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 Extensive scalp calcifications consistent with history of kidney disease. IMPRESSION: Decreasing bifrontal acute subdural hematomas, without significant mass effect and no residual midline shift. Decreasing LEFT subarachnoid hemorrhage. Minimal probable subdural hematoma at skull base. Stable appearance of LEFT frontal intraparenchymal hematoma. RIGHT parietal encephalomalacia most consistent with old RIGHT MCA territory infarct. Electronically Signed   By: Awilda Metro M.D.   On: 01/10/2016 05:53   Ct Pelvis Wo Contrast  01/08/2016  CLINICAL DATA:  Status post fall today with onset of left groin pain. Initial encounter. EXAM: CT PELVIS WITHOUT CONTRAST TECHNIQUE: Multidetector CT imaging of the pelvis was performed following the standard protocol without intravenous contrast. COMPARISON:  Plain films left hip this same day. FINDINGS: As seen on the comparison plain films, the patient has a left parasymphyseal pubic bone fracture which extends just into the left superior pubic ramus. No other fracture is identified. Specifically, there is no acetabular fracture as questioned on plain films. Bones are markedly osteopenic. The hips are located. Visualized lower lumbar spine is unremarkable. Imaged intrapelvic contents demonstrate extensive atherosclerosis. The patient has a pelvic right kidney. Multiple small hyperintense lesions in the kidney cannot be definitively characterized but likely represent complex cysts. IMPRESSION: Left parasymphyseal pubic bone fracture extends just into the left superior pubic ramus fracture. No other acute abnormality is identified. Specifically, there is no  acetabular fracture. Osteopenia. Atherosclerosis. Pelvic right kidney with small hyperintense lesions within it likely representing complex cysts. Electronically Signed   By: Drusilla Kanner M.D.   On: 01/08/2016 14:44    Assessment/Plan: Diagnosis: TBI/pelvic injuries after fall from bed. Hx of MS 1. Does the need for close, 24 hr/day medical supervision in concert with the patient's rehab needs make it unreasonable for this patient to be served in a less intensive setting? Yes 2. Co-Morbidities requiring supervision/potential complications: DM, anemia, afib 3. Due to bladder management, bowel management, safety, skin/wound care, disease management, medication administration, pain management and patient education, does the patient require 24 hr/day rehab nursing? Yes 4. Does the patient require coordinated care of a physician, rehab nurse, PT (1-2 hrs/day, 5 days/week), OT (1-2 hrs/day, 5 days/week) and SLP (1-2 hrs/day, 5 days/week) to address physical and functional deficits in the context of the above medical diagnosis(es)? Potentially Addressing deficits in the following areas: balance, endurance, locomotion, strength, transferring, bowel/bladder control, bathing, dressing, feeding, grooming, toileting, cognition, speech and psychosocial support 5. Can the patient actively participate in an intensive therapy program of at least 3 hrs of therapy per day at least 5 days per week? Potentially 6. The potential for patient to make measurable gains while on inpatient  rehab is good 7. Anticipated functional outcomes upon discharge from inpatient rehab are supervision  with PT, supervision with OT, supervision with SLP. 8. Estimated rehab length of stay to reach the above functional goals is: potentially 9-14 days 9. Does the patient have adequate social supports and living environment to accommodate these discharge functional goals? Potentially 10. Anticipated D/C setting: Home 11. Anticipated post  D/C treatments: HH therapy 12. Overall Rehab/Functional Prognosis: good and fair  RECOMMENDATIONS: This patient's condition is appropriate for continued rehabilitative care in the following setting: CIR Patient has agreed to participate in recommended program. Potentially Note that insurance prior authorization may be required for reimbursement for recommended care.  Comment: I have concerns about the activity tolerance of this frail woman with MS. Rehab Admissions Coordinator to follow up.  Thanks,  Ranelle Oyster, MD, [place] [facility]     01/10/2016

## 2016-01-10 NOTE — Progress Notes (Signed)
Internal Medicine Daily Consult Note   Date: 01/10/2016               Patient Name:  [name] [name] MRN: [id number]  DOB: Jan 14, 1946 Age / Sex: 70 y.o., female   PCP: Marva Panda, NP         Requesting Physician: Dr. Minerva Fester Md, MD    Consulting Reason:  Management of multiple medical comorbidities     Chief Complaint: Subdural hematoma, pubic ramus fracture  Subjective:   Pt now has complete expressive aphasia, and possibly some receptive aphasia as well. She was not following neurologic commands, but was intermittently nodding her head.   Otherwise no acute events- blood pressure stable   Meds: Current Facility-Administered Medications  Medication Dose Route Frequency Provider Last Rate Last Dose  . 0.45 % sodium chloride infusion   Intravenous Continuous Jimmye Norman, MD 75 mL/hr at 01/09/16 2131    . antiseptic oral rinse (CPC / CETYLPYRIDINIUM CHLORIDE 0.05%) solution 7 mL  7 mL Mouth Rinse q12n4p Jimmye Norman, MD   7 mL at 01/09/16 1600  . antiseptic oral rinse (CPC / CETYLPYRIDINIUM CHLORIDE 0.05%) solution 7 mL  7 mL Mouth Rinse BID Jimmye Norman, MD   7 mL at 01/10/16 1000  . chlorhexidine (PERIDEX) 0.12 % solution 15 mL  15 mL Mouth Rinse BID Jimmye Norman, MD   15 mL at 01/10/16 1030  . cycloSPORINE modified (NEORAL) capsule 25 mg  25 mg Oral BID Harriette Bouillon, MD   25 mg at 01/09/16 2125  . dexamethasone (DECADRON) injection 4 mg  4 mg Intravenous Q6H Julio Sicks, MD   4 mg at 01/10/16 [phone]  . digoxin (LANOXIN) 0.25 MG/ML injection 0.0625 mg  0.0625 mg Intravenous Daily Jimmye Norman, MD   0.0625 mg at 01/09/16 1125  . HYDROmorphone (DILAUDID) injection 0.5 mg  0.5 mg Intravenous Q2H PRN Harriette Bouillon, MD   0.5 mg at 01/10/16 0303  . hydroxypropyl methylcellulose / hypromellose (ISOPTO TEARS / GONIOVISC) 2.5 % ophthalmic solution 1 drop  1 drop Both Eyes QID PRN Harriette Bouillon, MD      . insulin aspart (novoLOG) injection 0-20 Units  0-20 Units Subcutaneous Q4H Harriette Bouillon, MD   3 Units at 01/10/16 0400  . insulin glargine (LANTUS) injection 7 Units  7 Units Subcutaneous Daily Deneise Lever, MD   7 Units at 01/10/16 1029  . labetalol (NORMODYNE,TRANDATE) injection 10 mg  10 mg Intravenous Q2H PRN Levert Feinstein, MD   10 mg at 01/10/16 0835  . mycophenolate (CELLCEPT) capsule 500 mg  500 mg Oral BID Harriette Bouillon, MD   500 mg at 01/09/16 2125  . ondansetron (ZOFRAN) tablet 4 mg  4 mg Oral Q6H PRN Harriette Bouillon, MD       Or  . ondansetron (ZOFRAN) injection 4 mg  4 mg Intravenous Q6H PRN Harriette Bouillon, MD      . pravastatin (PRAVACHOL) tablet 20 mg  20 mg Oral QPM Harriette Bouillon, MD      . valACYclovir (VALTREX) tablet 500 mg  500 mg Oral Q M,W,F Harriette Bouillon, MD      . Melene Muller ON 01/20/2016] Vitamin D (Ergocalciferol) (DRISDOL) capsule 50,000 Units  50,000 Units Oral Q30 days Harriette Bouillon, MD        Allergies: Allergies as of 01/07/2016 - Review Complete 01/07/2016  Allergen Reaction Noted  . Bactrim [sulfamethoxazole-trimethoprim] Itching 05/17/2012  . Aspirin Nausea Only 10/29/2015  . Shellfish allergy Hives 11/20/2015  . Codeine Hives  and Nausea Only   . Latex Rash 06/19/2013  . Penicillins Nausea And Vomiting and Rash 01/06/2012  . Tape Itching and Rash 11/19/2015   Past Medical History  Diagnosis Date  . Hypertension   . Stroke [facility]) December 2011    left MCA distribution  . Permanent atrial fibrillation (HCC)     coumadin clinic at [facility]. Has previously required Lovenox->Coumadin bridge.  . Severe mitral stenosis by prior echocardiogram     last echo 07/2012  . Aortic stenosis   . Symptomatic bradycardia     s/p pacer  . Depression   . HSV (herpes simplex virus) infection   . Anemia   . Hyperparathyroidism, primary (HCC)     s/p parathyroidectomy  . PAD (peripheral artery disease) (HCC)     a. s/p RLE angioplasty with [name]. b. (by [name])  L popliteral artery orbital atherectomy and balloon  angioplasty & L SFA orbital atherectomy and balloon angioplasty 11/30/12.  [name] PVD (peripheral vascular disease) (HCC)   . Presence of permanent cardiac pacemaker   . High cholesterol   . CHF (congestive heart failure) (HCC)   . TIA (transient ischemic attack) 08/2009    [name] 09/05/2009  . Atrial thrombus (HCC)     appendage/notes 09/05/2009  . Type II diabetes mellitus (HCC)   . History of blood transfusion     related to "kidney transplant"  . Chronic kidney disease     s/p renal transplant;  follows with [name]  . GERD (gastroesophageal reflux disease)     hx  . History of stomach ulcers   . Diabetic foot ulcer (HCC)     "left is worse than right" (11/20/2015)   Past Surgical History  Procedure Laterality Date  . Insert / replace / remove pacemaker    . Kidney transplant  December 1999  . Abdominal angiogram  11/30/2012    ABDOMINAL AORTIC ANGIOGRAM   DR ARDIA  . Dialysis fistula creation    . Abdominal aortagram N/A 11/30/2012    Procedure: ABDOMINAL [name];  Surgeon: [name], MD;  Location: [facility] CATH LAB;  Service: Cardiovascular;  Laterality: N/A;  . Peripheral vascular catheterization N/A 11/20/2015    Procedure: Abdominal Aortogram;  Surgeon: [name], MD;  Location: [facility] INVASIVE CV LAB;  Service: Cardiovascular;  Laterality: N/A;  . Peripheral vascular catheterization Bilateral 11/20/2015    Procedure: Lower Extremity Angiography;  Surgeon: [name], MD;  Location: [facility] INVASIVE CV LAB;  Service: Cardiovascular;  Laterality: Bilateral;  . Peripheral vascular catheterization  11/20/2015    Procedure: Peripheral Vascular Balloon Angioplasty;  Surgeon: [name], MD;  Location: [facility] INVASIVE CV LAB;  Service: Cardiovascular;;  . Tonsillectomy    . Appendectomy    . Hernia repair    . Umbilical hernia repair     Family History  Problem Relation Age of Onset  . Adopted: Yes  . Kidney disease Brother     died from  . Kidney disease Sister      died from  . Heart attack Neg Hx   . Stroke Neg Hx    Social History   Social History  . Marital Status: Legally Separated    Spouse Name: N/A  . Number of Children: N/A  . Years of Education: N/A   Occupational History  . Not on file.   Social History Main Topics  . Smoking status: Former Smoker -- 0.25 packs/day for 10 years    Types:  Cigarettes  . Smokeless tobacco: Never Used     Comment: "quit smoking cigarettes in the 1990s"  . Alcohol Use: No  . Drug Use: No  . Sexual Activity: No   Other Topics Concern  . Not on file   Social History Narrative    Physical Exam: Blood pressure 105/81, pulse 63, temperature 97.6 F (36.4 C), temperature source Oral, resp. rate 26, height 5\' 2"  (1.575 m), weight 79 lb 12.9 oz (36.2 kg), SpO2 98 %.  General: Vital signs reviewed. complete expressive aphasia  HEENT:  Can not follow commands for EOMI Cardiovascular: 2/6 systolic murmur, regular rate Pulmonary/Chest: unable to examine right lung due to discomfort, left lung showed no wheezing or crackles  Abdominal: Soft, non-tender, non-distended, BS +   Lab results: Basic Metabolic Panel:  Recent Labs  16/10/96 2213 01/09/16 0256 01/10/16 0300  NA 138 138 142  K 3.9 4.1 4.0  CL 106 105 110  CO2 19* 17* 20*  GLUCOSE 268* 297* 118*  BUN 18 19 21*  CREATININE 1.38* 1.43* 1.07*  CALCIUM 8.6* 8.6* 8.4*  MG 1.4*  --   --    Liver Function Tests:  Recent Labs  01/07/16 2145 01/09/16 0256  AST 53* 47*  ALT 39 33  ALKPHOS 100 80  BILITOT 1.0 0.9  PROT 6.4* 5.5*  ALBUMIN 3.1* 2.7*   No results for input(s): LIPASE, AMYLASE in the last 72 hours. No results for input(s): AMMONIA in the last 72 hours. CBC:  Recent Labs  01/09/16 0256 01/10/16 0300  WBC 13.1* 16.2*  HGB 11.7* 10.8*  HCT 35.9* 33.9*  MCV 98.4 99.4  PLT 247 209   Cardiac Enzymes:  Recent Labs  01/07/16 1636  TROPONINI <0.03   BNP: No results for input(s): PROBNP in the last 72  hours. D-Dimer: No results for input(s): DDIMER in the last 72 hours. CBG:  Recent Labs  01/09/16 1155 01/09/16 1530 01/09/16 1920 01/09/16 2319 01/10/16 0312 01/10/16 0819  GLUCAP 229* 278* 72 151* 133* 105*   Hemoglobin A1C: No results for input(s): HGBA1C in the last 72 hours. Fasting Lipid Panel: No results for input(s): CHOL, HDL, LDLCALC, TRIG, CHOLHDL, LDLDIRECT in the last 72 hours. Thyroid Function Tests: No results for input(s): TSH, T4TOTAL, FREET4, T3FREE, THYROIDAB in the last 72 hours. Anemia Panel: No results for input(s): VITAMINB12, FOLATE, FERRITIN, TIBC, IRON, RETICCTPCT in the last 72 hours. Coagulation:  Recent Labs  01/09/16 0256 01/10/16 0300  LABPROT 16.5* 16.8*  INR 1.32 1.36   Urine Drug Screen: Drugs of Abuse  No results found for: LABOPIA, COCAINSCRNUR, LABBENZ, AMPHETMU, THCU, LABBARB  Alcohol Level: No results for input(s): ETH in the last 72 hours. Urinalysis: No results for input(s): COLORURINE, LABSPEC, PHURINE, GLUCOSEU, HGBUR, BILIRUBINUR, KETONESUR, PROTEINUR, UROBILINOGEN, NITRITE, LEUKOCYTESUR in the last 72 hours.  Invalid input(s): APPERANCEUR   Imaging results:  Ct Head Wo Contrast  01/10/2016  CLINICAL DATA:  Followup subdural hematoma. History of TIA and diabetes, kidney disease. EXAM: CT HEAD WITHOUT CONTRAST TECHNIQUE: Contiguous axial images were obtained from the base of the skull through the vertex without intravenous contrast. COMPARISON:  CT HEAD Jan 07, 2016 FINDINGS: INTRACRANIAL CONTENTS: Similar 23 x 14 mm LEFT frontal intraparenchymal hematoma with surrounding low-density vasogenic edema. Decreasing LEFT subarachnoid hemorrhage and subdural hematoma: Previously measuring up to 12 mm, now 6 mm. No midline shift. Trace probable residual subdural and hematoma at jugular foramen. Trace, decreased RIGHT frontal subdural hematoma. Small area RIGHT parietal encephalomalacia again  noted. No acute large vascular territory  infarcts. Ventricles and sulci are overall normal for patient's age. Marked dural calcifications. Basal cisterns are patent. ORBITS: The included ocular globes and orbital contents are non-suspicious. SINUSES: The mastoid aircells and included paranasal sinuses are well-aerated. SKULL/SOFT TISSUES: Small LEFT frontal scalp hematoma. No skull fracture. 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 Extensive scalp calcifications consistent with history of kidney disease. IMPRESSION: Decreasing bifrontal acute subdural hematomas, without significant mass effect and no residual midline shift. Decreasing LEFT subarachnoid hemorrhage. Minimal probable subdural hematoma at skull base. Stable appearance of LEFT frontal intraparenchymal hematoma. RIGHT parietal encephalomalacia most consistent with old RIGHT MCA territory infarct. Electronically Signed   By: Awilda Metro M.D.   On: 01/10/2016 05:53   Ct Pelvis Wo Contrast  01/08/2016  CLINICAL DATA:  Status post fall today with onset of left groin pain. Initial encounter. EXAM: CT PELVIS WITHOUT CONTRAST TECHNIQUE: Multidetector CT imaging of the pelvis was performed following the standard protocol without intravenous contrast. COMPARISON:  Plain films left hip this same day. FINDINGS: As seen on the comparison plain films, the patient has a left parasymphyseal pubic bone fracture which extends just into the left superior pubic ramus. No other fracture is identified. Specifically, there is no acetabular fracture as questioned on plain films. Bones are markedly osteopenic. The hips are located. Visualized lower lumbar spine is unremarkable. Imaged intrapelvic contents demonstrate extensive atherosclerosis. The patient has a pelvic right kidney. Multiple small hyperintense lesions in the kidney cannot be definitively characterized but likely represent complex cysts. IMPRESSION: Left  parasymphyseal pubic bone fracture extends just into the left superior pubic ramus fracture. No other acute abnormality is identified. Specifically, there is no acetabular fracture. Osteopenia. Atherosclerosis. Pelvic right kidney with small hyperintense lesions within it likely representing complex cysts. Electronically Signed   By: Drusilla Kanner M.D.   On: 01/08/2016 14:44    Other results: EKG: normal EKG, normal sinus rhythm, unchanged from previous tracings, atrial fibrillation, rate 75bpm, low voltage recording.  Assessment, Plan, & Recommendations by Problem:   Acute subdural hematoma in a patient with chronic Coumadin use- supratherapeutic with INR around 4.65.- INR rapidly reversed L: Today PT had slightly increased and INR of 1.36. A seizure was witnessed by the trauma surgeon and keppra started . Follow up ct scan suggests decreasing bifrontal acute subdural hematomas, without significant mass effec and no residual midline shift. Also, decreasing left subarachnoid hemorrhage.   -neurosurgery and trauma following- they do not believe craniotomy and clot evacuation will improve her situation- will follow up ct scan  -decadron -started Keppra   Essential hypertension- blood pressure on the lower side -held amlodipine and other antihypertensives- discontinued metoprolol -decreased labetalol PRN frequency to q2 hours PRN  T2DM: Received 21 units of sliding scale insulin yesterday. CBGs stable with lowest of 72 -decreased lantus to 5 units -SSI  Atrial fibrillation likely physiologic : Supertherapeutic INR at presentation. Anticoagulation reversal and discontinuation at this time. She does have a history of prior CVA and left atrial appendage thrombus.  -digoxin 0.125 mg daily    Severe PAD with ischemia of LLE and ulceration: Nonpalpable lower extremity pulses. Ulceration of the toes on both feet L > R. Some gangrenous changes of left 4th toe. Per outpatient cardiology recs and  recent angioplasty LLE revascularization is unlikely to benefit and agree would benefit from BKA. No sign of infection at this time and lower acuity than  new SDH and ramus fracture.  -wound care following      Dispo: Disposition is deferred at this time, awaiting improvement of current medical problems.  The patient does have a current PCP Marva Panda, NP) and needs a hospital follow-up appointment after discharge.  Signed: Deneise Lever, MD 01/10/2016, 10:37 AM

## 2016-01-10 NOTE — Progress Notes (Signed)
Rehab admissions - I met with patient at the bedside.  She is globally aphasic.  Please see rehab consult done by Dr. Naaman Plummer.  He expresses concerns for tolerance of therapies on inpatient rehab.  I have left booklets at the bedside.  Will follow progress over the weekend and check back on Monday.  I have no rehab beds available over the next couple days.  Call me for questions.  #[phone]

## 2016-01-11 ENCOUNTER — Inpatient Hospital Stay (HOSPITAL_COMMUNITY): Payer: Medicare Other

## 2016-01-11 DIAGNOSIS — R569 Unspecified convulsions: Secondary | ICD-10-CM

## 2016-01-11 LAB — CBC
HCT: 32 % — ABNORMAL LOW (ref 36.0–46.0)
Hemoglobin: 10.5 g/dL — ABNORMAL LOW (ref 12.0–15.0)
MCH: 32.7 pg (ref 26.0–34.0)
MCHC: 32.8 g/dL (ref 30.0–36.0)
MCV: 99.7 fL (ref 78.0–100.0)
Platelets: 193 10*3/uL (ref 150–400)
RBC: 3.21 MIL/uL — AB (ref 3.87–5.11)
RDW: 13.8 % (ref 11.5–15.5)
WBC: 11.5 10*3/uL — AB (ref 4.0–10.5)

## 2016-01-11 LAB — BASIC METABOLIC PANEL
ANION GAP: 12 (ref 5–15)
BUN: 16 mg/dL (ref 6–20)
CALCIUM: 8.4 mg/dL — AB (ref 8.9–10.3)
CO2: 20 mmol/L — AB (ref 22–32)
CREATININE: 0.81 mg/dL (ref 0.44–1.00)
Chloride: 107 mmol/L (ref 101–111)
GFR calc Af Amer: >60 mL/min (ref >60)
GFR calc non Af Amer: >60 mL/min (ref >60)
GLUCOSE: 130 mg/dL — AB (ref 65–99)
Potassium: 4.5 mmol/L (ref 3.5–5.1)
Sodium: 139 mmol/L (ref 135–145)

## 2016-01-11 LAB — GLUCOSE, CAPILLARY
GLUCOSE-CAPILLARY: 217 mg/dL — AB (ref 65–99)
GLUCOSE-CAPILLARY: 188 mg/dL — AB (ref 65–99)
GLUCOSE-CAPILLARY: 182 mg/dL — AB (ref 65–99)
Glucose-Capillary: 129 mg/dL — ABNORMAL HIGH (ref 65–99)
Glucose-Capillary: 100 mg/dL — ABNORMAL HIGH (ref 65–99)
Glucose-Capillary: 249 mg/dL — ABNORMAL HIGH (ref 65–99)

## 2016-01-11 MED ORDER — SODIUM CHLORIDE 0.9 % IV SOLN
750.0000 mg | Freq: Once | INTRAVENOUS | Status: DC
  Filled 2016-01-11: qty 7.5

## 2016-01-11 MED ORDER — SODIUM CHLORIDE 0.9 % IV SOLN
250.0000 mg | Freq: Once | INTRAVENOUS | Status: AC
  Administered 2016-01-11: 250 mg via INTRAVENOUS
  Filled 2016-01-11: qty 2.5

## 2016-01-11 MED ORDER — LORAZEPAM 2 MG/ML IJ SOLN
1.0000 mg | Freq: Once | INTRAMUSCULAR | Status: AC
  Administered 2016-01-11: 1 mg via INTRAVENOUS
  Filled 2016-01-11: qty 1

## 2016-01-11 MED ORDER — INSULIN ASPART 100 UNIT/ML ~~LOC~~ SOLN
0.0000 [IU] | Freq: Three times a day (TID) | SUBCUTANEOUS | Status: DC
  Administered 2016-01-11: 2 [IU] via SUBCUTANEOUS
  Administered 2016-01-11: 3 [IU] via SUBCUTANEOUS
  Administered 2016-01-12: 5 [IU] via SUBCUTANEOUS

## 2016-01-11 MED ORDER — SODIUM CHLORIDE 0.9 % IV SOLN
750.0000 mg | Freq: Two times a day (BID) | INTRAVENOUS | Status: DC
  Administered 2016-01-11: 750 mg via INTRAVENOUS
  Filled 2016-01-11 (×2): qty 7.5

## 2016-01-11 MED ORDER — SODIUM CHLORIDE 0.9 % IV SOLN
1500.0000 mg | Freq: Two times a day (BID) | INTRAVENOUS | Status: DC
  Filled 2016-01-11: qty 15

## 2016-01-11 MED ORDER — SODIUM CHLORIDE 0.9 % IV SOLN
1000.0000 mg | Freq: Two times a day (BID) | INTRAVENOUS | Status: DC
  Administered 2016-01-12 – 2016-01-16 (×9): 1000 mg via INTRAVENOUS
  Filled 2016-01-11 (×12): qty 10

## 2016-01-11 NOTE — Progress Notes (Signed)
Pt seen and examined. Had apparent focal SZ this am despite Keppra consisting primarily of facial twitching. No decrease in level of consciousness.  EXAM: Temp:  [97.3 F (36.3 C)-98.1 F (36.7 C)] 97.5 F (36.4 C) (05/06 0846) Pulse Rate:  [40-111] 86 (05/06 0800) Resp:  [9-26] 21 (05/06 0800) BP: (107-142)/(53-104) 140/104 mmHg (05/06 0800) SpO2:  [93 %-100 %] 99 % (05/06 0800) Intake/Output      05/05 0701 - 05/06 0700 05/06 0701 - 05/07 0700   P.O. 300    I.V. (mL/kg) 1800 (49.7) 75 (2.1)   IV Piggyback 215    Total Intake(mL/kg) 2315 (64) 75 (2.1)   Net +2315 +75        Urine Occurrence 7 x     Awake, alert Global aphasia Does not follow verbal commands, but will mimic briskly MAE well  LABS: Lab Results  Component Value Date   CREATININE 0.81 01/11/2016   BUN 16 01/11/2016   NA 139 01/11/2016   K 4.5 01/11/2016   CL 107 01/11/2016   CO2 20* 01/11/2016   Lab Results  Component Value Date   WBC 11.5* 01/11/2016   HGB 10.5* 01/11/2016   HCT 32.0* 01/11/2016   MCV 99.7 01/11/2016   PLT 193 01/11/2016    IMAGING: CTH yesterday demonstrates stable to improved bifrontal SDH without significant mass effect. Stable left frontal contusions. No HCP.  IMPRESSION: - 70 y.o. female s/p fall with small SDH and contusions resulting in severe global aphasia. Appears to have had some focal SZ despite initiation of Keppra  PLAN: - Will increase Keppra to 750 BID - Get EEG

## 2016-01-11 NOTE — Progress Notes (Addendum)
Internal Medicine Daily Consult Note   Date: 01/11/2016               Patient Name:  [name] [name] MRN: [id number]  DOB: 22-Oct-1945 Age / Sex: 70 y.o., female   PCP: Marva Panda, NP         Requesting Physician: Dr. Minerva Fester Md, MD    Consulting Reason:  Management of multiple medical comorbidities     Chief Complaint: Subdural hematoma, pubic ramus fracture  Subjective:   Pt had one time hypoglycemia to 42 despite decreasing the lantus yesterday, which improved to 131 after receiving d50.\ Pt today had much worse neurological function and had global aphasia and not able to follow commands at all.   Meds: Current Facility-Administered Medications  Medication Dose Route Frequency Provider Last Rate Last Dose  . 0.45 % sodium chloride infusion   Intravenous Continuous Jimmye Norman, MD 75 mL/hr at 01/10/16 2330 1,000 mL at 01/10/16 2330  . antiseptic oral rinse (CPC / CETYLPYRIDINIUM CHLORIDE 0.05%) solution 7 mL  7 mL Mouth Rinse q12n4p Jimmye Norman, MD   7 mL at 01/10/16 1600  . antiseptic oral rinse (CPC / CETYLPYRIDINIUM CHLORIDE 0.05%) solution 7 mL  7 mL Mouth Rinse BID Jimmye Norman, MD   7 mL at 01/10/16 1000  . chlorhexidine (PERIDEX) 0.12 % solution 15 mL  15 mL Mouth Rinse BID Jimmye Norman, MD   15 mL at 01/10/16 2100  . cycloSPORINE modified (NEORAL) capsule 25 mg  25 mg Oral BID Harriette Bouillon, MD   25 mg at 01/10/16 2104  . dexamethasone (DECADRON) injection 4 mg  4 mg Intravenous Q6H Julio Sicks, MD   4 mg at 01/11/16 0548  . digoxin (LANOXIN) 0.25 MG/ML injection 0.0625 mg  0.0625 mg Intravenous Daily Jimmye Norman, MD   0.0625 mg at 01/10/16 1039  . HYDROmorphone (DILAUDID) injection 0.5 mg  0.5 mg Intravenous Q2H PRN Harriette Bouillon, MD   0.5 mg at 01/11/16 0316  . hydroxypropyl methylcellulose / hypromellose (ISOPTO TEARS / GONIOVISC) 2.5 % ophthalmic solution 1 drop  1 drop Both Eyes QID PRN Harriette Bouillon, MD      . insulin aspart (novoLOG) injection 0-20 Units  0-20  Units Subcutaneous Q4H Harriette Bouillon, MD   3 Units at 01/11/16 0327  . insulin glargine (LANTUS) injection 5 Units  5 Units Subcutaneous Daily Deneise Lever, MD      . labetalol (NORMODYNE,TRANDATE) injection 10 mg  10 mg Intravenous Q2H PRN Levert Feinstein, MD   10 mg at 01/10/16 2335  . levETIRAcetam (KEPPRA) 500 mg in sodium chloride 0.9 % 100 mL IVPB  500 mg Intravenous Q12H Freeman Caldron, PA-C   500 mg at 01/10/16 2208  . mycophenolate (CELLCEPT) capsule 500 mg  500 mg Oral BID Harriette Bouillon, MD   500 mg at 01/10/16 2103  . ondansetron (ZOFRAN) tablet 4 mg  4 mg Oral Q6H PRN Harriette Bouillon, MD       Or  . ondansetron [facility]) injection 4 mg  4 mg Intravenous Q6H PRN Harriette Bouillon, MD      . pravastatin (PRAVACHOL) tablet 20 mg  20 mg Oral QPM Harriette Bouillon, MD   20 mg at 01/10/16 1753  . valACYclovir (VALTREX) tablet 500 mg  500 mg Oral Q M,W,F Harriette Bouillon, MD   500 mg at 01/10/16 1055  . [START ON 01/20/2016] Vitamin D (Ergocalciferol) (DRISDOL) capsule 50,000 Units  50,000 Units Oral Q30 days Harriette Bouillon, MD  Allergies: Allergies as of 01/07/2016 - Review Complete 01/07/2016  Allergen Reaction Noted  . Bactrim [sulfamethoxazole-trimethoprim] Itching 05/17/2012  . Aspirin Nausea Only 10/29/2015  . Shellfish allergy Hives 11/20/2015  . Codeine Hives and Nausea Only   . Latex Rash 06/19/2013  . Penicillins Nausea And Vomiting and Rash 01/06/2012  . Tape Itching and Rash 11/19/2015   Past Medical History  Diagnosis Date  . Hypertension   . Stroke [facility]) December 2011    left MCA distribution  . Permanent atrial fibrillation (HCC)     coumadin clinic at [facility]. Has previously required Lovenox->Coumadin bridge.  . Severe mitral stenosis by prior echocardiogram     last echo 07/2012  . Aortic stenosis   . Symptomatic bradycardia     s/p pacer  . Depression   . HSV (herpes simplex virus) infection   . Anemia   . Hyperparathyroidism, primary  (HCC)     s/p parathyroidectomy  . PAD (peripheral artery disease) (HCC)     a. s/p RLE angioplasty with [name]. b. (by [name])  L popliteral artery orbital atherectomy and balloon angioplasty & L SFA orbital atherectomy and balloon angioplasty 11/30/12.  [name] PVD (peripheral vascular disease) (HCC)   . Presence of permanent cardiac pacemaker   . High cholesterol   . CHF (congestive heart failure) (HCC)   . TIA (transient ischemic attack) 08/2009    [name] 09/05/2009  . Atrial thrombus (HCC)     appendage/notes 09/05/2009  . Type II diabetes mellitus (HCC)   . History of blood transfusion     related to "kidney transplant"  . Chronic kidney disease     s/p renal transplant;  follows with [name]  . GERD (gastroesophageal reflux disease)     hx  . History of stomach ulcers   . Diabetic foot ulcer (HCC)     "left is worse than right" (11/20/2015)   Past Surgical History  Procedure Laterality Date  . Insert / replace / remove pacemaker    . Kidney transplant  December 1999  . Abdominal angiogram  11/30/2012    ABDOMINAL AORTIC ANGIOGRAM   DR ARDIA  . Dialysis fistula creation    . Abdominal aortagram N/A 11/30/2012    Procedure: ABDOMINAL [name];  Surgeon: [name], MD;  Location: [facility] CATH LAB;  Service: Cardiovascular;  Laterality: N/A;  . Peripheral vascular catheterization N/A 11/20/2015    Procedure: Abdominal Aortogram;  Surgeon: [name], MD;  Location: [facility] INVASIVE CV LAB;  Service: Cardiovascular;  Laterality: N/A;  . Peripheral vascular catheterization Bilateral 11/20/2015    Procedure: Lower Extremity Angiography;  Surgeon: [name], MD;  Location: [facility] INVASIVE CV LAB;  Service: Cardiovascular;  Laterality: Bilateral;  . Peripheral vascular catheterization  11/20/2015    Procedure: Peripheral Vascular Balloon Angioplasty;  Surgeon: [name], MD;  Location: [facility] INVASIVE CV LAB;  Service: Cardiovascular;;  . Tonsillectomy    .  Appendectomy    . Hernia repair    . Umbilical hernia repair     Family History  Problem Relation Age of Onset  . Adopted: Yes  . Kidney disease Brother     died from  . Kidney disease Sister     died from  . Heart attack Neg Hx   . Stroke Neg Hx    Social History   Social History  . Marital Status: Legally Separated    Spouse Name: N/A  . Number of Children: N/A  .  Years of Education: N/A   Occupational History  . Not on file.   Social History Main Topics  . Smoking status: Former Smoker -- 0.25 packs/day for 10 years    Types: Cigarettes  . Smokeless tobacco: Never Used     Comment: "quit smoking cigarettes in the 1990s"  . Alcohol Use: No  . Drug Use: No  . Sexual Activity: No   Other Topics Concern  . Not on file   Social History Narrative    Physical Exam: Blood pressure 127/67, pulse 53, temperature 97.6 F (36.4 C), temperature source Axillary, resp. rate 14, height  (1.575 m), weight 79 lb 12.9 oz (36.2 kg), SpO2 99 %.  General: Vital signs reviewed. global aphasia  HEENT:  Can not follow commands for EOMI Cardiovascular: 2/6 systolic murmur, regular rate Pulmonary/Chest: unable to examine due to pt discomfort Abdominal: Soft, non-tender, non-distended, BS +   Lab results: Basic Metabolic Panel:  Recent Labs  09/81/19 2213  01/10/16 0300 01/11/16 0312  NA 138  < > 142 139  K 3.9  < > 4.0 4.5  CL 106  < > 110 107  CO2 19*  < > 20* 20*  GLUCOSE 268*  < > 118* 130*  BUN 18  < > 21* 16  CREATININE 1.38*  < > 1.07* 0.81  CALCIUM 8.6*  < > 8.4* 8.4*  MG 1.4*  --   --   --   < > = values in this interval not displayed. Liver Function Tests:  Recent Labs  01/09/16 0256  AST 47*  ALT 33  ALKPHOS 80  BILITOT 0.9  PROT 5.5*  ALBUMIN 2.7*   No results for input(s): LIPASE, AMYLASE in the last 72 hours. No results for input(s): AMMONIA in the last 72 hours. CBC:  Recent Labs  01/10/16 0300 01/11/16 0312  WBC 16.2* 11.5*  HGB  10.8* 10.5*  HCT 33.9* 32.0*  MCV 99.4 99.7  PLT 209 193   Cardiac Enzymes: No results for input(s): CKTOTAL, CKMB, CKMBINDEX, TROPONINI in the last 72 hours. BNP: No results for input(s): PROBNP in the last 72 hours. D-Dimer: No results for input(s): DDIMER in the last 72 hours. CBG:  Recent Labs  01/10/16 1154 01/10/16 1607 01/10/16 2024 01/10/16 2043 01/10/16 2314 01/11/16 0321  GLUCAP 146* 124* 42* 131* 98 129*   Hemoglobin A1C: No results for input(s): HGBA1C in the last 72 hours. Fasting Lipid Panel: No results for input(s): CHOL, HDL, LDLCALC, TRIG, CHOLHDL, LDLDIRECT in the last 72 hours. Thyroid Function Tests: No results for input(s): TSH, T4TOTAL, FREET4, T3FREE, THYROIDAB in the last 72 hours. Anemia Panel: No results for input(s): VITAMINB12, FOLATE, FERRITIN, TIBC, IRON, RETICCTPCT in the last 72 hours. Coagulation:  Recent Labs  01/09/16 0256 01/10/16 0300  LABPROT 16.5* 16.8*  INR 1.32 1.36   Urine Drug Screen: Drugs of Abuse  No results found for: LABOPIA, COCAINSCRNUR, LABBENZ, AMPHETMU, THCU, LABBARB  Alcohol Level: No results for input(s): ETH in the last 72 hours. Urinalysis: No results for input(s): COLORURINE, LABSPEC, PHURINE, GLUCOSEU, HGBUR, BILIRUBINUR, KETONESUR, PROTEINUR, UROBILINOGEN, NITRITE, LEUKOCYTESUR in the last 72 hours.  Invalid input(s): APPERANCEUR   Imaging results:  Ct Head Wo Contrast  01/10/2016  CLINICAL DATA:  Followup subdural hematoma. History of TIA and diabetes, kidney disease. EXAM: CT HEAD WITHOUT CONTRAST TECHNIQUE: Contiguous axial images were obtained from the base of the skull through the vertex without intravenous contrast. COMPARISON:  CT HEAD Jan 07, 2016 FINDINGS: INTRACRANIAL  CONTENTS: Similar 23 x 14 mm LEFT frontal intraparenchymal hematoma with surrounding low-density vasogenic edema. Decreasing LEFT subarachnoid hemorrhage and subdural hematoma: Previously measuring up to 12 mm, now 6 mm. No midline  shift. Trace probable residual subdural and hematoma at jugular foramen. Trace, decreased RIGHT frontal subdural hematoma. Small area RIGHT parietal encephalomalacia again noted. No acute large vascular territory infarcts. Ventricles and sulci are overall normal for patient's age. Marked dural calcifications. Basal cisterns are patent. ORBITS: The included ocular globes and orbital contents are non-suspicious. SINUSES: The mastoid aircells and included paranasal sinuses are well-aerated. SKULL/SOFT TISSUES: Small LEFT frontal scalp hematoma. No skull fracture. 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 Extensive scalp calcifications consistent with history of kidney disease. IMPRESSION: Decreasing bifrontal acute subdural hematomas, without significant mass effect and no residual midline shift. Decreasing LEFT subarachnoid hemorrhage. Minimal probable subdural hematoma at skull base. Stable appearance of LEFT frontal intraparenchymal hematoma. RIGHT parietal encephalomalacia most consistent with old RIGHT MCA territory infarct. Electronically Signed   By: Awilda Metro M.D.   On: 01/10/2016 05:53    Other results: EKG: normal EKG, normal sinus rhythm, unchanged from previous tracings, atrial fibrillation, rate 75bpm, low voltage recording.  Assessment, Plan, & Recommendations by Problem:   Acute subdural hematoma in a patient with chronic Coumadin use- supratherapeutic with INR around 4.65.- INR rapidly reversed L:  Follow up ct scan suggests decreasing bifrontal acute subdural hematomas, without significant mass effect and no residual midline shift. Also, decreasing left subarachnoid hemorrhage.   -neurosurgery and trauma following- they do not believe craniotomy and clot evacuation will improve her situation- will follow up ct scan -decadron -started Keppra   Essential hypertension- blood pressure stable -held  amlodipine and other antihypertensives- discontinued metoprolol -labetalol PRN frequency to q2 hours PRN  T2DM: CBGs stable with lowest of 42 improved to 131  -stopped lantus -SSI-S  Atrial fibrillation likely physiologic : Supertherapeutic INR at presentation. Anticoagulation reversal and discontinuation at this time. She does have a history of prior CVA and left atrial appendage thrombus.  -digoxin 0.125 mg daily    Severe PAD with ischemia of LLE and ulceration: Nonpalpable lower extremity pulses. Ulceration of the toes on both feet L > R. Some gangrenous changes of left 4th toe. Per outpatient cardiology recs and recent angioplasty LLE revascularization is unlikely to benefit and agree would benefit from BKA. No sign of infection at this time and lower acuity than new SDH and ramus fracture.  -wound care following   HF with severe mitral stenosis- per last echo- EF 60-65%  AKI on Chronic Kidney Disease ;likely stage 1- Patient with baseline creatinine around 1.1, which went up to 1.4 this admission, now trended down to 0.8     Dispo: Disposition is deferred at this time, awaiting improvement of current medical problems.  The patient does have a current PCP Marva Panda, NP) and needs a hospital follow-up appointment after discharge.  Signed: Deneise Lever, MD 01/11/2016, 8:28 AM

## 2016-01-11 NOTE — Progress Notes (Signed)
Subjective: SZ last night getting EEG  Objective: Vital signs in last 24 hours: Temp:  [97.4 F (36.3 C)-98.1 F (36.7 C)] 97.4 F (36.3 C) (05/06 1158) Pulse Rate:  [40-111] 68 (05/06 1000) Resp:  [9-24] 18 (05/06 1000) BP: (107-142)/(53-104) 125/61 mmHg (05/06 1000) SpO2:  [93 %-100 %] 99 % (05/06 1000) Last BM Date: 01/09/16  Intake/Output from previous day: 05/05 0701 - 05/06 0700 In: 2315 [P.O.:300; I.V.:1800; IV Piggyback:215] Out: -  Intake/Output this shift: Total I/O In: 345 [P.O.:120; I.V.:225] Out: -   Resp: clear to auscultation bilaterally Cardio: regular rate and rhythm, S1, S2 normal, no murmur, click, rub or gallop GI: soft, non-tender; bowel sounds normal; no masses,  no organomegaly txp SCARS  Neuro aphasia will mimic gestures  Lab Results:   Recent Labs  01/10/16 0300 01/11/16 0312  WBC 16.2* 11.5*  HGB 10.8* 10.5*  HCT 33.9* 32.0*  PLT 209 193   BMET  Recent Labs  01/10/16 0300 01/11/16 0312  NA 142 139  K 4.0 4.5  CL 110 107  CO2 20* 20*  GLUCOSE 118* 130*  BUN 21* 16  CREATININE 1.07* 0.81  CALCIUM 8.4* 8.4*   PT/INR  Recent Labs  01/09/16 0256 01/10/16 0300  LABPROT 16.5* 16.8*  INR 1.32 1.36   ABG No results for input(s): PHART, HCO3 in the last 72 hours.  Invalid input(s): PCO2, PO2  Studies/Results: Ct Head Wo Contrast  01/10/2016  CLINICAL DATA:  Followup subdural hematoma. History of TIA and diabetes, kidney disease. EXAM: CT HEAD WITHOUT CONTRAST TECHNIQUE: Contiguous axial images were obtained from the base of the skull through the vertex without intravenous contrast. COMPARISON:  CT HEAD Jan 07, 2016 FINDINGS: INTRACRANIAL CONTENTS: Similar 23 x 14 mm LEFT frontal intraparenchymal hematoma with surrounding low-density vasogenic edema. Decreasing LEFT subarachnoid hemorrhage and subdural hematoma: Previously measuring up to 12 mm, now 6 mm. No midline shift. Trace probable residual subdural and hematoma at jugular  foramen. Trace, decreased RIGHT frontal subdural hematoma. Small area RIGHT parietal encephalomalacia again noted. No acute large vascular territory infarcts. Ventricles and sulci are overall normal for patient's age. Marked dural calcifications. Basal cisterns are patent. ORBITS: The included ocular globes and orbital contents are non-suspicious. SINUSES: The mastoid aircells and included paranasal sinuses are well-aerated. SKULL/SOFT TISSUES: Small LEFT frontal scalp hematoma. No skull fracture. 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 Extensive scalp calcifications consistent with history of kidney disease. IMPRESSION: Decreasing bifrontal acute subdural hematomas, without significant mass effect and no residual midline shift. Decreasing LEFT subarachnoid hemorrhage. Minimal probable subdural hematoma at skull base. Stable appearance of LEFT frontal intraparenchymal hematoma. RIGHT parietal encephalomalacia most consistent with old RIGHT MCA territory infarct. Electronically Signed   By: Awilda Metro M.D.   On: 01/10/2016 05:53    Anti-infectives: Anti-infectives    Start     Dose/Rate Route Frequency Ordered Stop   01/08/16 1000  valACYclovir (VALTREX) tablet 500 mg     500 mg Oral Every M-W-F 01/07/16 1751        Assessment/Plan: Patient Active Problem List   Diagnosis Date Noted  . Anticoagulated on Coumadin   . Subdural hematoma (HCC)   . Chronic atrial fibrillation (HCC)   . IDDM (insulin dependent diabetes mellitus) (HCC)   . History of renal transplant   . PVD (peripheral vascular disease) (HCC)   . Ischemic ulcer of both feet (HCC)   . SDH (subdural hematoma) (HCC)  01/07/2016  . Type II diabetes mellitus (HCC) 11/23/2015  . Hypoglycemia 11/23/2015  . Critical lower limb ischemia 11/20/2015  . Encounter for therapeutic drug monitoring 10/06/2013  . Colon cancer screening 06/01/2012  . HSV  08/06/2010  . PRIMARY HYPERPARATHYROIDISM 08/06/2010  . ANEMIA 08/06/2010  . DEPRESSION 08/06/2010  . Mitral stenosis 08/06/2010  . Atrial fibrillation (HCC) 08/06/2010  . 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 DIASTOLIC DYSFUNCTION 08/06/2010  . Cerebral artery occlusion with cerebral infarction (HCC) 08/06/2010  . PVD 08/06/2010  . RENAL FAILURE, END STAGE 08/06/2010  . PACEMAKER, PERMANENT 08/06/2010     Aphasia Getting EEG today Seizure last night addressed by [facility]     LOS: 4 days    [name] [name] A. 01/11/2016

## 2016-01-11 NOTE — Progress Notes (Signed)
EEG completed; results pending.    

## 2016-01-11 NOTE — Consult Note (Signed)
Neurology Consultation Reason for Consult: Seizures Referring Physician: Sharion Dove  CC: Seizures  History is obtained from: Chart  HPI: [name] [name] is a 70 y.o. female with a history of atrial fibrillation and was on anticoagulation and presented with fall from bed. She bumped her head on the nightstand. She did not lose consciousness and EMS came in but she refused transport. She had headache and she therefore called EMS and was brought to the emergency department. Initially had a GCS of 15. On arrival on 5/3, she was noted to be complaining of pain. CT head demonstrated cerebral contusions as well as SDH. She had progressively worsening speech over the next few days. Today, she is essentially globally aphasic. Also, overnight, it has been noted that she has been having partial seizures.   ROS:  Unable to obtain due to altered mental status.   Past Medical History  Diagnosis Date  . Hypertension   . Stroke [facility]) December 2011    left MCA distribution  . Permanent atrial fibrillation (HCC)     coumadin clinic at [facility]. Has previously required Lovenox->Coumadin bridge.  . Severe mitral stenosis by prior echocardiogram     last echo 07/2012  . Aortic stenosis   . Symptomatic bradycardia     s/p pacer  . Depression   . HSV (herpes simplex virus) infection   . Anemia   . Hyperparathyroidism, primary (HCC)     s/p parathyroidectomy  . PAD (peripheral artery disease) (HCC)     a. s/p RLE angioplasty with [name]. b. (by [name])  L popliteral artery orbital atherectomy and balloon angioplasty & L SFA orbital atherectomy and balloon angioplasty 11/30/12.  [name] PVD (peripheral vascular disease) (HCC)   . Presence of permanent cardiac pacemaker   . High cholesterol   . CHF (congestive heart failure) (HCC)   . TIA (transient ischemic attack) 08/2009    [name] 09/05/2009  . Atrial thrombus (HCC)     appendage/notes 09/05/2009  . Type II diabetes mellitus (HCC)   .  History of blood transfusion     related to "kidney transplant"  . Chronic kidney disease     s/p renal transplant;  follows with [name]  . GERD (gastroesophageal reflux disease)     hx  . History of stomach ulcers   . Diabetic foot ulcer (HCC)     "left is worse than right" (11/20/2015)     Family History  Problem Relation Age of Onset  . Adopted: Yes  . Kidney disease Brother     died from  . Kidney disease Sister     died from  . Heart attack Neg Hx   . Stroke Neg Hx      Social History:  reports that she has quit smoking. Her smoking use included Cigarettes. She has a 2.5 pack-year smoking history. She has never used smokeless tobacco. She reports that she does not drink alcohol or use illicit drugs.   Exam: Current vital signs: BP 125/61 mmHg  Pulse 68  Temp(Src) 97.4 F (36.3 C) (Oral)  Resp 18  Ht  (1.575 m)  Wt 36.2 kg (79 lb 12.9 oz)  BMI 14.59 kg/m2  SpO2 99% Vital signs in last 24 hours: Temp:  [97.4 F (36.3 C)-98.1 F (36.7 C)] 97.4 F (36.3 C) (05/06 1158) Pulse Rate:  [40-111] 68 (05/06 1000) Resp:  [9-24] 18 (05/06 1000) BP: (107-142)/(53-104) 125/61 mmHg (05/06 1000) SpO2:  [93 %-100 %] 99 % (  05/06 1000)   Physical Exam  Constitutional: Appears well-developed and well-nourished.  Psych: Affect appropriate to situation Eyes: No scleral injection HENT: No OP obstrucion Head: Normocephalic.  Cardiovascular: Normal rate and regular rhythm.  Respiratory: Effort normal and breath sounds normal to anterior ascultation GI: Soft.  No distension. There is no tenderness.  Skin: WDI  Neuro: Mental Status: Patient is awake, alert. She follows a single command to show thumbs bilaterally, but otherwise does not follow any clear verbal commands but does mimic when demonstrated things readily. Cranial Nerves: II: Blinks to threat bilaerally Pupils are equal, round, and reactive to light.   III,IV, VI: She fixates and tracks to the left,  but does not clearly cross midline to the right.  V: Facial sensation is symmetric to temperature VII: Facial movement is symmetric.  VIII: hearing is intact to voice X: Uvula elevates symmetrically XI: Shoulder shrug is symmetric. XII: tongue is midline without atrophy or fasciculations.  Motor: Tone is normal. Bulk is normal. She may move her right arm slightly less than her left, but does move bilaterally.  Sensory: Responds to nox stim x 4.  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 She is on a relatively low dose of keppra, and will increase this. Seizures associated with structural lesions can sometimes be difficult to control. Given the frequency, will also give a one time dose of ativan to try to calm things down some.   Recommendations: 1) EEG being performed 2) increase keppra to 1gm BID 3) Will continue to follow.    Ritta Slot, MD [facility] [phone]  If 7pm- 7am, please page neurology on call as listed in AMION.

## 2016-01-11 NOTE — Procedures (Signed)
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 One channel was dedicated to EKG recording.    Background: The background consists predominantly of intermixed delta and theta frequencies. There is a focal prominance to the slow activity as well as frequent epileptiform discharges in the left frontal region, maximal at Fp1, F3. These become periodic at times.   There was a seizure recorded arising from Fp1, F3. This spreads to involve C3, then the rest of the left hemisphere. It had evolution in field, frequency, amplitude and morphology. There was a clinical correlate of right facial twitching, the body is covered, but appears that there might be some arm twitching as well.    Photic stimulation: Physiologic driving is not performed  EEG Abnormalities: 1) Seizure lasting just over 3 minutes arising from the left frontal region.  2) Periodic lateralized epileptiform discharges in the left frontal region.  3) generalized irregular slow activity.   Clinical Interpretation: This EEG recorded a focal seizure of left frontal origination lasting 3 minutes. There is interictal evidence of cortical irritability in the left frontal region as well.   Ritta Slot, MD [facility] [phone]  If 7pm- 7am, please page neurology on call as listed in AMION.

## 2016-01-12 DIAGNOSIS — I6200 Nontraumatic subdural hemorrhage, unspecified: Secondary | ICD-10-CM

## 2016-01-12 LAB — CBC
HEMATOCRIT: 35.5 % — AB (ref 36.0–46.0)
HEMOGLOBIN: 11.4 g/dL — AB (ref 12.0–15.0)
MCH: 31.9 pg (ref 26.0–34.0)
MCHC: 32.1 g/dL (ref 30.0–36.0)
MCV: 99.4 fL (ref 78.0–100.0)
Platelets: 190 10*3/uL (ref 150–400)
RBC: 3.57 MIL/uL — AB (ref 3.87–5.11)
RDW: 13.7 % (ref 11.5–15.5)
WBC: 7.8 10*3/uL (ref 4.0–10.5)

## 2016-01-12 LAB — BASIC METABOLIC PANEL
Anion gap: 12 (ref 5–15)
BUN: 13 mg/dL (ref 6–20)
CHLORIDE: 103 mmol/L (ref 101–111)
CO2: 20 mmol/L — ABNORMAL LOW (ref 22–32)
CREATININE: 0.9 mg/dL (ref 0.44–1.00)
Calcium: 8 mg/dL — ABNORMAL LOW (ref 8.9–10.3)
GFR calc non Af Amer: >60 mL/min (ref >60)
Glucose, Bld: 305 mg/dL — ABNORMAL HIGH (ref 65–99)
POTASSIUM: 4.2 mmol/L (ref 3.5–5.1)
SODIUM: 135 mmol/L (ref 135–145)

## 2016-01-12 LAB — GLUCOSE, CAPILLARY
GLUCOSE-CAPILLARY: 293 mg/dL — AB (ref 65–99)
Glucose-Capillary: 324 mg/dL — ABNORMAL HIGH (ref 65–99)
Glucose-Capillary: 280 mg/dL — ABNORMAL HIGH (ref 65–99)
Glucose-Capillary: 289 mg/dL — ABNORMAL HIGH (ref 65–99)

## 2016-01-12 MED ORDER — INSULIN GLARGINE 100 UNIT/ML ~~LOC~~ SOLN
3.0000 [IU] | Freq: Every day | SUBCUTANEOUS | Status: DC
  Administered 2016-01-12: 3 [IU] via SUBCUTANEOUS
  Filled 2016-01-12 (×3): qty 0.03

## 2016-01-12 MED ORDER — INSULIN ASPART 100 UNIT/ML ~~LOC~~ SOLN
0.0000 [IU] | Freq: Three times a day (TID) | SUBCUTANEOUS | Status: DC
  Administered 2016-01-12: 8 [IU] via SUBCUTANEOUS
  Administered 2016-01-12: 11 [IU] via SUBCUTANEOUS
  Administered 2016-01-13: 8 [IU] via SUBCUTANEOUS
  Administered 2016-01-13: 11 [IU] via SUBCUTANEOUS
  Administered 2016-01-13 – 2016-01-14 (×2): 8 [IU] via SUBCUTANEOUS
  Administered 2016-01-14: 11 [IU] via SUBCUTANEOUS
  Administered 2016-01-14: 8 [IU] via SUBCUTANEOUS
  Administered 2016-01-15: 3 [IU] via SUBCUTANEOUS
  Administered 2016-01-15: 8 [IU] via SUBCUTANEOUS
  Administered 2016-01-15: 5 [IU] via SUBCUTANEOUS

## 2016-01-12 NOTE — Progress Notes (Signed)
Internal Medicine Daily Consult Note   Date: 01/12/2016               Patient Name:  [name] [name] MRN: [id number]  DOB: 01/10/1946 Age / Sex: 70 y.o., female   PCP: Marva Panda, NP         Requesting Physician: Dr. Minerva Fester Md, MD    Consulting Reason:  Management of multiple medical comorbidities     Chief Complaint: Subdural hematoma, pubic ramus fracture  Subjective:   Pt's mental status was remarkably improved, and she was pleasantly eating breakfast when examining. She still was aphasic, more receptive than expressive and had some expressive echolalia. She was able to nod her head in response to her name.   Meds: Current Facility-Administered Medications  Medication Dose Route Frequency Provider Last Rate Last Dose  . 0.45 % sodium chloride infusion   Intravenous Continuous Jimmye Norman, MD 75 mL/hr at 01/12/16 1000    . antiseptic oral rinse (CPC / CETYLPYRIDINIUM CHLORIDE 0.05%) solution 7 mL  7 mL Mouth Rinse q12n4p Jimmye Norman, MD   7 mL at 01/11/16 1600  . antiseptic oral rinse (CPC / CETYLPYRIDINIUM CHLORIDE 0.05%) solution 7 mL  7 mL Mouth Rinse BID Jimmye Norman, MD   7 mL at 01/10/16 1000  . chlorhexidine (PERIDEX) 0.12 % solution 15 mL  15 mL Mouth Rinse BID Jimmye Norman, MD   15 mL at 01/12/16 1003  . cycloSPORINE modified (NEORAL) capsule 25 mg  25 mg Oral BID Harriette Bouillon, MD   25 mg at 01/12/16 1003  . dexamethasone (DECADRON) injection 4 mg  4 mg Intravenous Q6H Julio Sicks, MD   4 mg at 01/12/16 0538  . digoxin (LANOXIN) 0.25 MG/ML injection 0.0625 mg  0.0625 mg Intravenous Daily Jimmye Norman, MD   0.0625 mg at 01/12/16 1003  . HYDROmorphone (DILAUDID) injection 0.5 mg  0.5 mg Intravenous Q2H PRN Harriette Bouillon, MD   0.5 mg at 01/12/16 0251  . hydroxypropyl methylcellulose / hypromellose (ISOPTO TEARS / GONIOVISC) 2.5 % ophthalmic solution 1 drop  1 drop Both Eyes QID PRN Harriette Bouillon, MD      . insulin aspart (novoLOG) injection 0-9 Units  0-9 Units  Subcutaneous TID WC Deneise Lever, MD   5 Units at 01/12/16 0737  . insulin glargine (LANTUS) injection 3 Units  3 Units Subcutaneous Daily Deneise Lever, MD      . labetalol (NORMODYNE,TRANDATE) injection 10 mg  10 mg Intravenous Q2H PRN Levert Feinstein, MD   10 mg at 01/10/16 2335  . levETIRAcetam (KEPPRA) 1,000 mg in sodium chloride 0.9 % 100 mL IVPB  1,000 mg Intravenous Q12H Rejeana Brock, MD   1,000 mg at 01/12/16 0320  . mycophenolate (CELLCEPT) capsule 500 mg  500 mg Oral BID Harriette Bouillon, MD   500 mg at 01/12/16 1003  . ondansetron (ZOFRAN) tablet 4 mg  4 mg Oral Q6H PRN Harriette Bouillon, MD       Or  . ondansetron (ZOFRAN) injection 4 mg  4 mg Intravenous Q6H PRN Harriette Bouillon, MD      . pravastatin (PRAVACHOL) tablet 20 mg  20 mg Oral QPM Harriette Bouillon, MD   20 mg at 01/11/16 1725  . valACYclovir (VALTREX) tablet 500 mg  500 mg Oral Q M,W,F Harriette Bouillon, MD   500 mg at 01/10/16 1055  . [START ON 01/20/2016] Vitamin D (Ergocalciferol) (DRISDOL) capsule 50,000 Units  50,000 Units Oral Q30 days Harriette Bouillon, MD  Allergies: Allergies as of 01/07/2016 - Review Complete 01/07/2016  Allergen Reaction Noted  . Bactrim [sulfamethoxazole-trimethoprim] Itching 05/17/2012  . Aspirin Nausea Only 10/29/2015  . Shellfish allergy Hives 11/20/2015  . Codeine Hives and Nausea Only   . Latex Rash 06/19/2013  . Penicillins Nausea And Vomiting and Rash 01/06/2012  . Tape Itching and Rash 11/19/2015   Past Medical History  Diagnosis Date  . Hypertension   . Stroke [facility]) December 2011    left MCA distribution  . Permanent atrial fibrillation (HCC)     coumadin clinic at [facility]. Has previously required Lovenox->Coumadin bridge.  . Severe mitral stenosis by prior echocardiogram     last echo 07/2012  . Aortic stenosis   . Symptomatic bradycardia     s/p pacer  . Depression   . HSV (herpes simplex virus) infection   . Anemia   . Hyperparathyroidism, primary  (HCC)     s/p parathyroidectomy  . PAD (peripheral artery disease) (HCC)     a. s/p RLE angioplasty with [name]. b. (by [name])  L popliteral artery orbital atherectomy and balloon angioplasty & L SFA orbital atherectomy and balloon angioplasty 11/30/12.  [name] PVD (peripheral vascular disease) (HCC)   . Presence of permanent cardiac pacemaker   . High cholesterol   . CHF (congestive heart failure) (HCC)   . TIA (transient ischemic attack) 08/2009    [name] 09/05/2009  . Atrial thrombus (HCC)     appendage/notes 09/05/2009  . Type II diabetes mellitus (HCC)   . History of blood transfusion     related to "kidney transplant"  . Chronic kidney disease     s/p renal transplant;  follows with [name]  . GERD (gastroesophageal reflux disease)     hx  . History of stomach ulcers   . Diabetic foot ulcer (HCC)     "left is worse than right" (11/20/2015)   Past Surgical History  Procedure Laterality Date  . Insert / replace / remove pacemaker    . Kidney transplant  December 1999  . Abdominal angiogram  11/30/2012    ABDOMINAL AORTIC ANGIOGRAM   DR ARDIA  . Dialysis fistula creation    . Abdominal aortagram N/A 11/30/2012    Procedure: ABDOMINAL [name];  Surgeon: [name], MD;  Location[facility] CATH LAB;  Service: Cardiovascular;  Laterality: N/A;  . Peripheral vascular catheterization N/A 11/20/2015    Procedure: Abdominal Aortogram;  Surgeon: [name], MD;  Location: [facility] INVASIVE CV LAB;  Service: Cardiovascular;  Laterality: N/A;  . Peripheral vascular catheterization Bilateral 11/20/2015    Procedure: Lower Extremity Angiography;  Surgeon: [name], MD;  Location: [facility] INVASIVE CV LAB;  Service: Cardiovascular;  Laterality: Bilateral;  . Peripheral vascular catheterization  11/20/2015    Procedure: Peripheral Vascular Balloon Angioplasty;  Surgeon: [name], MD;  Location: [facility] INVASIVE CV LAB;  Service: Cardiovascular;;  . Tonsillectomy    .  Appendectomy    . Hernia repair    . Umbilical hernia repair     Family History  Problem Relation Age of Onset  . Adopted: Yes  . Kidney disease Brother     died from  . Kidney disease Sister     died from  . Heart attack Neg Hx   . Stroke Neg Hx    Social History   Social History  . Marital Status: Legally Separated    Spouse Name: N/A  . Number of Children: N/A  .  Years of Education: N/A   Occupational History  . Not on file.   Social History Main Topics  . Smoking status: Former Smoker -- 0.25 packs/day for 10 years    Types: Cigarettes  . Smokeless tobacco: Never Used     Comment: "quit smoking cigarettes in the 1990s"  . Alcohol Use: No  . Drug Use: No  . Sexual Activity: No   Other Topics Concern  . Not on file   Social History Narrative    Physical Exam: Blood pressure 133/73, pulse 87, temperature 97.5 F (36.4 C), temperature source Oral, resp. rate 18, height 5\' 2"  (1.575 m), weight 79 lb 12.9 oz (36.2 kg), SpO2 97 %.  General: Vital signs reviewed. global aphasia , some echolalia HEENT:  Can not continue to  follow commands for EOMI Cardiovascular: 2/6 systolic murmur, regular rate Pulmonary/Chest: unable to examine due to pt discomfort Abdominal: Soft, non-tender, non-distended, BS + Extremities: foot bandaged   Lab results: Basic Metabolic Panel:  Recent Labs  22/63/33 0312 01/12/16 0300  NA 139 135  K 4.5 4.2  CL 107 103  CO2 20* 20*  GLUCOSE 130* 305*  BUN 16 13  CREATININE 0.81 0.90  CALCIUM 8.4* 8.0*   Liver Function Tests: No results for input(s): AST, ALT, ALKPHOS, BILITOT, PROT, ALBUMIN in the last 72 hours. No results for input(s): LIPASE, AMYLASE in the last 72 hours. No results for input(s): AMMONIA in the last 72 hours. CBC:  Recent Labs  01/11/16 0312 01/12/16 0300  WBC 11.5* 7.8  HGB 10.5* 11.4*  HCT 32.0* 35.5*  MCV 99.7 99.4  PLT 193 190   Cardiac Enzymes: No results for input(s): CKTOTAL, CKMB,  CKMBINDEX, TROPONINI in the last 72 hours. BNP: No results for input(s): PROBNP in the last 72 hours. D-Dimer: No results for input(s): DDIMER in the last 72 hours. CBG:  Recent Labs  01/11/16 0844 01/11/16 1156 01/11/16 1655 01/11/16 1926 01/11/16 2312 01/12/16 0724  GLUCAP 100* 217* 188* 182* 249* 293*   Hemoglobin A1C: No results for input(s): HGBA1C in the last 72 hours. Fasting Lipid Panel: No results for input(s): CHOL, HDL, LDLCALC, TRIG, CHOLHDL, LDLDIRECT in the last 72 hours. Thyroid Function Tests: No results for input(s): TSH, T4TOTAL, FREET4, T3FREE, THYROIDAB in the last 72 hours. Anemia Panel: No results for input(s): VITAMINB12, FOLATE, FERRITIN, TIBC, IRON, RETICCTPCT in the last 72 hours. Coagulation:  Recent Labs  01/10/16 0300  LABPROT 16.8*  INR 1.36   Urine Drug Screen: Drugs of Abuse  No results found for: LABOPIA, COCAINSCRNUR, LABBENZ, AMPHETMU, THCU, LABBARB  Alcohol Level: No results for input(s): ETH in the last 72 hours. Urinalysis: No results for input(s): COLORURINE, LABSPEC, PHURINE, GLUCOSEU, HGBUR, BILIRUBINUR, KETONESUR, PROTEINUR, UROBILINOGEN, NITRITE, LEUKOCYTESUR in the last 72 hours.  Invalid input(s): APPERANCEUR   Imaging results:  No results found.  Other results: EKG: normal EKG, normal sinus rhythm, unchanged from previous tracings, atrial fibrillation, rate 75bpm, low voltage recording.  Assessment, Plan, & Recommendations by Problem:   Acute subdural hematoma in a patient with chronic Coumadin use- supratherapeutic with INR around 4.65.- INR rapidly reversed L:  Follow up ct scan suggests decreasing bifrontal acute subdural hematomas, without significant mass effect and no residual midline shift. Also, decreasing left subarachnoid hemorrhage.  Pt's aphasia is improving, likely due to increased AED and better seizure control. No further clinical seizure noted. 5/6 EEG showed seizure lasting 3 minutes in left frontal  region and periodic epileptiform changes in the left  frontal region   -neurosurgery and trauma following -decadron -Keppra 1 mg BID -repeat EEG tomorrow   Essential hypertension- blood pressure stable -held amlodipine and other antihypertensives- discontinued metoprolol -labetalol PRN frequency to q2 hours PRN  T2DM: CBGs have been elevated to high 200s, and will likely increase now that patient is eating   -restarted Lantus 3 units , titrate as needed -SSI-S  Atrial fibrillation likely physiologic : Supertherapeutic INR at presentation. Anticoagulation reversal and discontinuation at this time. She does have a history of prior CVA and left atrial appendage thrombus.  -digoxin 0.125 mg daily    Severe PAD with ischemia of LLE and ulceration: Nonpalpable lower extremity pulses. Ulceration of the toes on both feet L > R. Some gangrenous changes of left 4th toe. Per outpatient cardiology recs and recent angioplasty LLE revascularization is unlikely to benefit and agree would benefit from BKA. No sign of infection at this time and lower acuity than new SDH and ramus fracture.  -wound care following  -orthopedics following  HF with severe mitral stenosis- per last echo- EF 60-65%  AKI on Chronic Kidney Disease ;likely stage 1- Patient with baseline creatinine around 1.1, which went up to 1.4 this admission, now trended down to 0.8     Dispo: Disposition is deferred at this time, awaiting improvement of current medical problems.  The patient does have a current PCP Marva Panda, NP) and needs a hospital follow-up appointment after discharge.  Signed: Deneise Lever, MD 01/12/2016, 10:23 AM

## 2016-01-12 NOTE — Progress Notes (Signed)
  Subjective: Pt awake repeats her name mimics words very pleasant   Objective: Vital signs in last 24 hours: Temp:  [97.4 F (36.3 C)-98.2 F (36.8 C)] 97.5 F (36.4 C) (05/07 0800) Pulse Rate:  [30-115] 57 (05/07 0800) Resp:  [13-38] 15 (05/07 0800) BP: (114-156)/(61-94) 152/69 mmHg (05/07 0800) SpO2:  [96 %-100 %] 97 % (05/07 0800) Last BM Date: 01/09/16  Intake/Output from previous day: 05/06 0701 - 05/07 0700 In: 2045 [P.O.:140; I.V.:1800; IV Piggyback:105] Out: -  Intake/Output this shift: Total I/O In: 75 [I.V.:75] Out: -   Awake  Mimics words aphasia   Moves all 4 extremities   CV: some bradycardia    Pulm  CTA   Ext.  Gangrenous toes left foot     Lab Results:   Recent Labs  01/11/16 0312 01/12/16 0300  WBC 11.5* 7.8  HGB 10.5* 11.4*  HCT 32.0* 35.5*  PLT 193 190   BMET  Recent Labs  01/11/16 0312 01/12/16 0300  NA 139 135  K 4.5 4.2  CL 107 103  CO2 20* 20*  GLUCOSE 130* 305*  BUN 16 13  CREATININE 0.81 0.90  CALCIUM 8.4* 8.0*   PT/INR  Recent Labs  01/10/16 0300  LABPROT 16.8*  INR 1.36   ABG No results for input(s): PHART, HCO3 in the last 72 hours.  Invalid input(s): PCO2, PO2  Studies/Results: No results found.  Anti-infectives: Anti-infectives    Start     Dose/Rate Route Frequency Ordered Stop   01/08/16 1000  valACYclovir (VALTREX) tablet 500 mg     500 mg Oral Every M-W-F 01/07/16 1751        Assessment/Plan: Patient Active Problem List   Diagnosis Date Noted  . Anticoagulated on Coumadin   . Subdural hematoma (HCC)   . Chronic atrial fibrillation (HCC)   . IDDM (insulin dependent diabetes mellitus) (HCC)   . History of renal transplant   . PVD (peripheral vascular disease) (HCC)   . Ischemic ulcer of both feet (HCC)   . SDH (subdural hematoma) (HCC) 01/07/2016  . Type II diabetes mellitus (HCC) 11/23/2015  . Hypoglycemia 11/23/2015  . Critical lower limb ischemia 11/20/2015  . Encounter for  therapeutic drug monitoring 10/06/2013  . Colon cancer screening 06/01/2012  . 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 PRIMARY HYPERPARATHYROIDISM 08/06/2010  . ANEMIA 08/06/2010  . DEPRESSION 08/06/2010  . Mitral stenosis 08/06/2010  . Atrial fibrillation (HCC) 08/06/2010  . 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 DIASTOLIC DYSFUNCTION 08/06/2010  . Cerebral artery occlusion with cerebral infarction (HCC) 08/06/2010  . PVD 08/06/2010  . RENAL FAILURE, END STAGE 08/06/2010  . PACEMAKER, PERMANENT 08/06/2010    Neurology consult appreciated  Focal sz activity noted and keppra dose increased  Still has aphasia but more alert today   Medicine following for multiple medica problems   Lajoyce Corners following toes  Needs amputation at some point    LOS: 5 days    Addy Mcmannis A. 01/12/2016

## 2016-01-12 NOTE — Progress Notes (Signed)
Patient seen and examined. Case d/w residents in detail. I agree with findings and plan as documented in Dr. Bo Merino note.  Speech is much improved today. No further evidence of focal seizures on increased keppra dose. She is noted to have elevated blood sugars today. Would restart lantus at 3 units and titrate up as required. Now that she is eating would consider changing SSI to moderate if her BS remain elevated

## 2016-01-12 NOTE — Progress Notes (Signed)
Subjective: Markedly improved from yesterday.   Exam: Filed Vitals:   01/12/16 0700 01/12/16 0800  BP: 142/71 152/69  Pulse: 30 57  Temp:  97.5 F (36.4 C)  Resp: 19 15   Gen: In bed, NAD Resp: non-labored breathing, no acute distress Abd: soft, nt  Neuro: MS: awake, alert, follows commmands briskly. Tells me her name, but has very perseverative speech, also some echolalia.  LJ:QGBEE, blinks to threat bilaterally Motor: moves all extremities to command Sensory:responds to stim x 4.   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 She has significantly improved with no further clinical seizure noted. Speech greatly improved.   Recommendations: 1) continue keppra 1gm BID 2) repeat EEG tomorrow.   Ritta Slot, MD [facility] [phone]  If 7pm- 7am, please page neurology on call as listed in AMION.

## 2016-01-12 NOTE — Progress Notes (Signed)
No issues overnight.  EXAM:  BP 152/69 mmHg  Pulse 57  Temp(Src) 97.5 F (36.4 C) (Oral)  Resp 15  Ht 5\' 2"  (1.575 m)  Wt 36.2 kg (79 lb 12.9 oz)  BMI 14.59 kg/m2  SpO2 97%  Awake, alert Able to say name, able to name pen and glasses Follows verbal commands briskly x4  IMPRESSION:  70 y.o. female s/p fall with SDH/IPH. Aphasia improving, likely with increased AED and better SZ control.   PLAN: - Cont current Keppra - Would be stable for transfer to floor from neurosurgical standpoint

## 2016-01-13 ENCOUNTER — Inpatient Hospital Stay (HOSPITAL_COMMUNITY): Payer: Medicare Other

## 2016-01-13 DIAGNOSIS — R569 Unspecified convulsions: Secondary | ICD-10-CM

## 2016-01-13 DIAGNOSIS — R4701 Aphasia: Secondary | ICD-10-CM

## 2016-01-13 DIAGNOSIS — N179 Acute kidney failure, unspecified: Secondary | ICD-10-CM

## 2016-01-13 LAB — BASIC METABOLIC PANEL
ANION GAP: 12 (ref 5–15)
BUN: 22 mg/dL — ABNORMAL HIGH (ref 6–20)
CALCIUM: 8.2 mg/dL — AB (ref 8.9–10.3)
CO2: 22 mmol/L (ref 22–32)
Chloride: 101 mmol/L (ref 101–111)
Creatinine, Ser: 1.12 mg/dL — ABNORMAL HIGH (ref 0.44–1.00)
GFR, EST AFRICAN AMERICAN: 58 mL/min — AB (ref >60)
GFR, EST NON AFRICAN AMERICAN: 50 mL/min — AB (ref >60)
GLUCOSE: 344 mg/dL — AB (ref 65–99)
POTASSIUM: 4.4 mmol/L (ref 3.5–5.1)
Sodium: 135 mmol/L (ref 135–145)

## 2016-01-13 LAB — GLUCOSE, CAPILLARY
GLUCOSE-CAPILLARY: 317 mg/dL — AB (ref 65–99)
GLUCOSE-CAPILLARY: 287 mg/dL — AB (ref 65–99)
GLUCOSE-CAPILLARY: 276 mg/dL — AB (ref 65–99)
Glucose-Capillary: 291 mg/dL — ABNORMAL HIGH (ref 65–99)

## 2016-01-13 LAB — CBC
HEMATOCRIT: 37.3 % (ref 36.0–46.0)
Hemoglobin: 12.3 g/dL (ref 12.0–15.0)
MCH: 32.6 pg (ref 26.0–34.0)
MCHC: 33 g/dL (ref 30.0–36.0)
MCV: 98.9 fL (ref 78.0–100.0)
PLATELETS: 205 10*3/uL (ref 150–400)
RBC: 3.77 MIL/uL — AB (ref 3.87–5.11)
RDW: 13.7 % (ref 11.5–15.5)
WBC: 9.6 10*3/uL (ref 4.0–10.5)

## 2016-01-13 LAB — PROTIME-INR
INR: 1.42 (ref 0.00–1.49)
Prothrombin Time: 17.4 seconds — ABNORMAL HIGH (ref 11.6–15.2)

## 2016-01-13 MED ORDER — DIGOXIN 125 MCG PO TABS
0.1250 mg | ORAL_TABLET | Freq: Every day | ORAL | Status: DC
  Administered 2016-01-14 – 2016-01-16 (×3): 0.125 mg via ORAL
  Filled 2016-01-13 (×3): qty 1

## 2016-01-13 MED ORDER — INSULIN GLARGINE 100 UNIT/ML ~~LOC~~ SOLN
6.0000 [IU] | Freq: Every day | SUBCUTANEOUS | Status: DC
  Administered 2016-01-13: 6 [IU] via SUBCUTANEOUS
  Filled 2016-01-13 (×2): qty 0.06

## 2016-01-13 MED ORDER — AMLODIPINE BESYLATE 5 MG PO TABS
5.0000 mg | ORAL_TABLET | Freq: Every day | ORAL | Status: DC
  Administered 2016-01-13 – 2016-01-16 (×4): 5 mg via ORAL
  Filled 2016-01-13 (×4): qty 1

## 2016-01-13 MED ORDER — GLUCERNA SHAKE PO LIQD
237.0000 mL | Freq: Three times a day (TID) | ORAL | Status: DC
Start: 2016-01-13 — End: 2016-01-16
  Administered 2016-01-13 – 2016-01-16 (×8): 237 mL via ORAL
  Filled 2016-01-13 (×12): qty 237

## 2016-01-13 MED ORDER — PREDNISONE 5 MG PO TABS
5.0000 mg | ORAL_TABLET | Freq: Every day | ORAL | Status: DC
  Administered 2016-01-16: 5 mg via ORAL
  Filled 2016-01-13: qty 1

## 2016-01-13 MED ORDER — SODIUM CHLORIDE 0.45 % IV SOLN
INTRAVENOUS | Status: DC
  Administered 2016-01-13: 21:00:00 via INTRAVENOUS

## 2016-01-13 MED ORDER — DEXAMETHASONE SODIUM PHOSPHATE 4 MG/ML IJ SOLN
4.0000 mg | Freq: Two times a day (BID) | INTRAMUSCULAR | Status: DC
  Administered 2016-01-14: 4 mg via INTRAVENOUS
  Filled 2016-01-13 (×2): qty 1

## 2016-01-13 NOTE — Evaluation (Signed)
Occupational Therapy Evaluation Patient Details Name: [name] [name] MRN: [id number] DOB: 08-23-1946 Today's Date: 01/13/2016    History of Present Illness 70 y.o. female admitted to [facility] on 01/07/16 with fall out of bed with resultant L SDH/SAH and L inferior/superior pubic rami fx (ortho consulted and pt is WBAT L leg).  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    Clinical Impression   Pt admitted with above. She demonstrates the below listed deficits and will benefit from continued OT to maximize safety and independence with BADLs.  Pt presents to OT with communication deficits.  Unable to accurately assess cognition due to severity of communication deficits.  Pt able to don socks with supervision, and moves well to the EOB, however, eval limited by HR into 180s with all attempts toward moving OOB.  She tolerated EOB sitting only briefly.  Pt does indicate significant pain Lt LE - unable to determine if pain in foot, hip, or both.   Progress will be dependent on pain control.  Will follow.  Optimally recommend CIR, but may need SNF if she doesn't have adequate assist at discharge.       Follow Up Recommendations  CIR;Supervision/Assistance - 24 hour    Equipment Recommendations  None recommended by OT (TBD)    Recommendations for Other Services Rehab consult     Precautions / Restrictions Precautions Precautions: Fall      Mobility Bed Mobility Overal bed mobility: Needs Assistance Bed Mobility: Supine to Sit;Sit to Supine     Supine to sit: Min assist Sit to supine: Min guard   General bed mobility comments: Pain limited ability to move to EOB   Transfers                 General transfer comment: Unable to attempt due to HR increased to 187 and pt returned to supine     Balance Overall balance  assessment: Needs assistance Sitting-balance support: Feet supported Sitting balance-Leahy Scale: Good                                      ADL Overall ADL's : Needs assistance/impaired Eating/Feeding: Set up;Sitting   Grooming: Wash/dry hands;Wash/dry face;Oral care;Brushing hair;Set up;Sitting   Upper Body Bathing: Supervision/ safety;Set up;Sitting   Lower Body Bathing: Moderate assistance;Bed level   Upper Body Dressing : Minimal assistance   Lower Body Dressing: Moderate assistance;Bed level Lower Body Dressing Details (indicate cue type and reason): Pt able to don bil. socks in long sitting  Toilet Transfer: Total assistance Toilet Transfer Details (indicate cue type and reason): Pt unable to attempt due to increased HR  Toileting- Clothing Manipulation and Hygiene: Total assistance;Bed level         General ADL Comments: Increased HR limited activity      Vision     Perception     Praxis      Pertinent Vitals/Pain Pain Assessment: Faces Faces Pain Scale: Hurts worst Pain Location: Lt LE and foot  Pain Descriptors / Indicators: Crying;Grimacing;Guarding;Other (Comment) (increased HR ) Pain Intervention(s): Monitored during session;Limited activity within patient's tolerance;Repositioned     Hand Dominance     Extremity/Trunk Assessment Upper Extremity Assessment Upper Extremity Assessment: Generalized weakness   Lower Extremity Assessment Lower Extremity  Assessment: Defer to PT evaluation   Cervical / Trunk Assessment Cervical / Trunk Assessment: Normal   Communication Communication Communication: Expressive difficulties;Receptive difficulties   Cognition Arousal/Alertness: Awake/alert Behavior During Therapy: WFL for tasks assessed/performed Overall Cognitive Status: Difficult to assess                     General Comments       Exercises       Shoulder Instructions      Home Living Family/patient expects to be  discharged to:: Unsure Living Arrangements: Alone Available Help at Discharge: Family;Available PRN/intermittently Type of Home: House Home Access: Level entry     Home Layout: One level         Bathroom Toilet: Standard Bathroom Accessibility: No   Home Equipment: Walker - 2 wheels;Walker - 4 wheels;Cane - quad   Additional Comments: Pt unable to provide info.  PLOF and home living populated from previous admission       Prior Functioning/Environment Level of Independence: Independent with assistive device(s)        Comments: used rollator mainly for gait (from previous admission note)    OT Diagnosis: Generalized weakness;Cognitive deficits   OT Problem List: Decreased strength;Decreased activity tolerance;Impaired balance (sitting and/or standing);Decreased safety awareness;Decreased cognition;Decreased knowledge of use of DME or AE;Cardiopulmonary status limiting activity;Pain   OT Treatment/Interventions: Self-care/ADL training;DME and/or AE instruction;Energy conservation;Therapeutic activities;Cognitive remediation/compensation;Patient/family education;Balance training    OT Goals(Current goals can be found in the care plan section) Acute Rehab OT Goals OT Goal Formulation: Patient unable to participate in goal setting Time For Goal Achievement: 01/27/16 Potential to Achieve Goals: Good ADL Goals Pt Will Perform Upper Body Bathing: with set-up;sitting Pt Will Perform Lower Body Bathing: with min assist;sit to/from stand Pt Will Perform Upper Body Dressing: with set-up;sitting Pt Will Perform Lower Body Dressing: with min assist;sit to/from stand Pt Will Transfer to Toilet: with min assist;ambulating;regular height toilet;bedside commode;grab bars Pt Will Perform Toileting - Clothing Manipulation and hygiene: with min assist;sit to/from stand  OT Frequency: Min 2X/week   Barriers to D/C: Decreased caregiver support          Co-evaluation PT/OT/SLP  Co-Evaluation/Treatment: Yes Reason for Co-Treatment: For patient/therapist safety          End of Session Nurse Communication: Mobility status;Other (comment) (HR)  Activity Tolerance: Patient limited by pain;Other (comment) (increased HR ) Patient left: in bed;with call bell/phone within reach;with bed alarm set   Time: 1610-9604 OT Time Calculation (min): 24 min Charges:  OT General Charges $OT Visit: 1 Procedure OT Evaluation $OT Eval Moderate Complexity: 1 Procedure G-Codes:    Catelynn Sparger M Jan 19, 2016, 4:08 PM

## 2016-01-13 NOTE — Progress Notes (Addendum)
Physical Therapy Treatment Patient Details Name: [name] [name] MRN: [id number] DOB: [birth year] Today's Date: 01/13/2016    History of Present Illness 70 y.o. female admitted to [facility] on 01/07/16 with fall out of bed with resultant L SDH/SAH and L inferior/superior pubic rami fx (ortho consulted and pt is WBAT L leg).  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     PT Comments    Patient with limited tolerance to mobility at this time due to likely ischemic pain in L foot and possibly L thigh/hip pain due to pelvic fx and HR elevation close to 200, but lower with rest in bed down to 60's.  Patient could benefit from CIR level rehab if able to obtain assist at home after d/c and control pain for improved upright tolerance to progress to OOB.  Follow Up Recommendations  CIR     Equipment Recommendations  None recommended by PT    Recommendations for Other Services       Precautions / Restrictions Precautions Precautions: Fall Precaution Comments: watch HR Restrictions Weight Bearing Restrictions: Yes LLE Weight Bearing: Weight bearing as tolerated    Mobility  Bed Mobility Overal bed mobility: Needs Assistance Bed Mobility: Supine to Sit;Sit to Supine     Supine to sit: Min assist Sit to supine: Min guard   General bed mobility comments: able to move to EOB with little assist, but limited by pain esp with LE dependency and HR up to 187  Transfers                 General transfer comment: Unable to attempt due to HR increased to 187 and pt returned to supine   Ambulation/Gait                 Stairs            Wheelchair Mobility    Modified Rankin (Stroke Patients Only)       Balance Overall balance assessment: Needs assistance Sitting-balance support: Feet supported Sitting balance-Leahy  Scale: Good Sitting balance - Comments: sat EOB < 2 minutes due to HR up to 187 and pt crying in pain                             Cognition Arousal/Alertness: Awake/alert Behavior During Therapy: WFL for tasks assessed/performed Overall Cognitive Status: Difficult to assess Area of Impairment: Following commands       Following Commands: Follows one step commands inconsistently       General Comments: follows commands better wtih multimodal cues    Exercises      General Comments General comments (skin integrity, edema, etc.): HR increased to 187 with activity.  After ~2-3 min rest HR in the 60s, however repeated attempt to move to EOB resulted in increaed HR into 180s (non sustained)      Pertinent Vitals/Pain Pain Assessment: Faces Faces Pain Scale: Hurts worst Pain Location: L LE and foot Pain Descriptors / Indicators: Crying;Grimacing;Guarding;Aching Pain Intervention(s): Monitored during session;Repositioned;Limited activity within patient's tolerance    Home Living Family/patient expects to be discharged to:: Unsure Living Arrangements: Alone Available Help at Discharge: Family;Available PRN/intermittently Type of Home: House Home Access: Level entry   Home Layout: One level Home Equipment: Walker - 2 wheels;Walker - 4 wheels;Cane -  quad Additional Comments: Pt unable to provide info.  PLOF and home living populated from previous admission     Prior Function Level of Independence: Independent with assistive device(s)      Comments: used rollator mainly for gait (from previous admission note)   PT Goals (current goals can now be found in the care plan section) Progress towards PT goals: Not progressing toward goals - comment (due to high HR and pain)    Frequency  Min 3X/week    PT Plan Current plan remains appropriate    Co-evaluation PT/OT/SLP Co-Evaluation/Treatment: Yes Reason for Co-Treatment: Complexity of the patient's impairments  (multi-system involvement);For patient/therapist safety PT goals addressed during session: Mobility/safety with mobility;Balance       End of Session   Activity Tolerance: Patient limited by pain;Treatment limited secondary to medical complications (Comment) Patient left: in bed     Time: 1349-1407 PT Time Calculation (min) (ACUTE ONLY): 18 min  Charges:  $Therapeutic Activity: 8-22 mins                    G Codes:      Elray Mcgregor 01/15/2016, 4:43 PM  Sheran Lawless, PT [phone] 2016/01/15

## 2016-01-13 NOTE — Progress Notes (Signed)
Inpatient Diabetes Program Recommendations  AACE/ADA: New Consensus Statement on Inpatient Glycemic Control (2015)  Target Ranges:  Prepandial:   less than 140 mg/dL      Peak postprandial:   less than 180 mg/dL (1-2 hours)      Critically ill patients:  140 - 180 mg/dL   Review of Glycemic Control  Inpatient Diabetes Program Recommendations:  Insulin - Basal: please increase Lantus to home dose basal 10 units  Thank you  Piedad Climes BSN, RN,CDE Inpatient Diabetes Coordinator [phone] (team pager)

## 2016-01-13 NOTE — Progress Notes (Signed)
Rehab admissions - I spoke with patient's daughter by phone.  Daughter works.  Another daughter lives in [place].  Patient lived by her self PTA.  Daughter is not sure who will stay with patient after a potential inpatient rehab admission.  Patient is not doing much with therapies yet.  Will need to see how she tolerates more intense therapies and then can decide if patient will need CIR or SNF for therapies.  Call me for questions.  #[phone]

## 2016-01-13 NOTE — Progress Notes (Signed)
EEG completed; results pending.    

## 2016-01-13 NOTE — Progress Notes (Signed)
Received from neuro ICU; patient is alert to persons, her name and date of birth, disoriented to time and situation. Oriented to room and unit routine; bed alarm and fall risk assessed and utilized. Patient c/o pain with touch or movement of her feet bilaterally, SCD's and boots in place. Will monitor for pain; denies need for medication for pain; just repositioned in bed.

## 2016-01-13 NOTE — Progress Notes (Signed)
Overall stable. Patient awake and alert. She is pleasant. She follows commands rapidly. She has no weakness. Her aphasia is slightly better but still reasonably complete nonfluent.  Overall progressing reasonably well. Continue efforts at rehabilitation. Okay to wean off steroids.

## 2016-01-13 NOTE — Progress Notes (Signed)
Patient ID: [name] [name], female   DOB: 08-Jun-1946, 70 y.o.   MRN: [id number] Medicine attending: I examined this patient this morning and reviewed pertinent clinical data along with resident physician Dr. Donnamarie Rossetti and I concur with his evaluation and management plan which we discussed together. She has shown some neurologic improvement over the last few days. No new seizure activity. Speech is fluent but still with some component of receptive and expressive aphasia. She can follow some commands. Blood pressure well controlled. Blood sugars running high. Given overall improvement and decrease in size of subdural hematomas, we will like to taper her off the dexamethasone.

## 2016-01-13 NOTE — Progress Notes (Signed)
Subjective: Continues to improve  Exam: Filed Vitals:   01/13/16 0600 01/13/16 0700  BP: 142/95 153/78  Pulse: 42 63  Temp:    Resp: 29 18   Gen: In bed, NAD Resp: non-labored breathing, no acute distress Abd: soft, nt  Neuro: MS: awake, alert, follows commmands briskly. She is able to answer some yes/no questions, but clearly has to work to get the correct response, some echolalia XN:ATFTD, blinks to threat bilaterally Motor: moves all extremities to command Sensory:responds to stim x 4.   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 She has significantly improved with no further clinical seizure noted. Speech greatly improved on treatment. Motor aphasia that she continues to exhibit I suspect may be more due to structural lesion.  If she were to continue to have frequent sharps or PLEDs, would be more aggressive with AED therapy.   Recommendations: 1) continue keppra 1gm BID 2) repeat EEG today  Ritta Slot, MD [facility] [phone]  If 7pm- 7am, please page neurology on call as listed in AMION.

## 2016-01-13 NOTE — Progress Notes (Signed)
Internal Medicine Daily Consult Note   Date: 01/13/2016               Patient Name:  [name] [name] MRN: [id number]  DOB: 1946-02-08 Age / Sex: 70 y.o., female   PCP: Marva Panda, NP         Requesting Physician: Dr. Minerva Fester Md, MD    Consulting Reason:  Management of multiple medical comorbidities     Chief Complaint: Subdural hematoma, pubic ramus fracture  Subjective:   Pt's mental status is improving, and she was pleasantly interactive. She still was aphasic, more receptive than expressive and had some expressive echolalia.  Today, she was able to fully enunciate her name, and the location, and able to tell the name of her mother.   Meds: Current Facility-Administered Medications  Medication Dose Route Frequency Provider Last Rate Last Dose  . 0.45 % sodium chloride infusion   Intravenous Continuous Jimmye Norman, MD 75 mL/hr at 01/13/16 0557 1,000 mL at 01/13/16 0557  . antiseptic oral rinse (CPC / CETYLPYRIDINIUM CHLORIDE 0.05%) solution 7 mL  7 mL Mouth Rinse q12n4p Jimmye Norman, MD   7 mL at 01/12/16 1600  . antiseptic oral rinse (CPC / CETYLPYRIDINIUM CHLORIDE 0.05%) solution 7 mL  7 mL Mouth Rinse BID Jimmye Norman, MD   7 mL at 01/13/16 0929  . chlorhexidine (PERIDEX) 0.12 % solution 15 mL  15 mL Mouth Rinse BID Jimmye Norman, MD   15 mL at 01/13/16 0917  . cycloSPORINE modified (NEORAL) capsule 25 mg  25 mg Oral BID Harriette Bouillon, MD   25 mg at 01/13/16 0916  . dexamethasone (DECADRON) injection 4 mg  4 mg Intravenous Q6H Julio Sicks, MD   4 mg at 01/13/16 0514  . digoxin (LANOXIN) 0.25 MG/ML injection 0.0625 mg  0.0625 mg Intravenous Daily Jimmye Norman, MD   0.0625 mg at 01/13/16 0919  . HYDROmorphone (DILAUDID) injection 0.5 mg  0.5 mg Intravenous Q2H PRN Harriette Bouillon, MD   0.5 mg at 01/13/16 4098  . hydroxypropyl methylcellulose / hypromellose (ISOPTO TEARS / GONIOVISC) 2.5 % ophthalmic solution 1 drop  1 drop Both Eyes QID PRN Harriette Bouillon, MD      . insulin aspart  (novoLOG) injection 0-15 Units  0-15 Units Subcutaneous TID WC Emelia Loron, MD   11 Units at 01/13/16 0847  . insulin glargine (LANTUS) injection 6 Units  6 Units Subcutaneous Daily Deneise Lever, MD   6 Units at 01/13/16 0933  . labetalol (NORMODYNE,TRANDATE) injection 10 mg  10 mg Intravenous Q2H PRN Levert Feinstein, MD   10 mg at 01/10/16 2335  . levETIRAcetam (KEPPRA) 1,000 mg in sodium chloride 0.9 % 100 mL IVPB  1,000 mg Intravenous Q12H Rejeana Brock, MD   1,000 mg at 01/13/16 0320  . mycophenolate (CELLCEPT) capsule 500 mg  500 mg Oral BID Harriette Bouillon, MD   500 mg at 01/13/16 0913  . ondansetron (ZOFRAN) tablet 4 mg  4 mg Oral Q6H PRN Harriette Bouillon, MD       Or  . ondansetron [facility]) injection 4 mg  4 mg Intravenous Q6H PRN Harriette Bouillon, MD      . pravastatin (PRAVACHOL) tablet 20 mg  20 mg Oral QPM Harriette Bouillon, MD   20 mg at 01/12/16 1731  . valACYclovir (VALTREX) tablet 500 mg  500 mg Oral Q M,W,F Harriette Bouillon, MD   500 mg at 01/13/16 0916  . [START ON 01/20/2016] Vitamin D (Ergocalciferol) (DRISDOL) capsule 50,000 Units  50,000 Units Oral Q30 days Harriette Bouillon, MD        Allergies: Allergies as of 01/07/2016 - Review Complete 01/07/2016  Allergen Reaction Noted  . Bactrim [sulfamethoxazole-trimethoprim] Itching 05/17/2012  . Aspirin Nausea Only 10/29/2015  . Shellfish allergy Hives 11/20/2015  . Codeine Hives and Nausea Only   . Latex Rash 06/19/2013  . Penicillins Nausea And Vomiting and Rash 01/06/2012  . Tape Itching and Rash 11/19/2015   Past Medical History  Diagnosis Date  . Hypertension   . Stroke [facility]) December 2011    left MCA distribution  . Permanent atrial fibrillation (HCC)     coumadin clinic at [facility]. Has previously required Lovenox->Coumadin bridge.  . Severe mitral stenosis by prior echocardiogram     last echo 07/2012  . Aortic stenosis   . Symptomatic bradycardia     s/p pacer  . Depression   . HSV (herpes  simplex virus) infection   . Anemia   . Hyperparathyroidism, primary (HCC)     s/p parathyroidectomy  . PAD (peripheral artery disease) (HCC)     a. s/p RLE angioplasty with [name]. b. (by [name])  L popliteral artery orbital atherectomy and balloon angioplasty & L SFA orbital atherectomy and balloon angioplasty 11/30/12.  [name] PVD (peripheral vascular disease) (HCC)   . Presence of permanent cardiac pacemaker   . High cholesterol   . CHF (congestive heart failure) (HCC)   . TIA (transient ischemic attack) 08/2009    [name] 09/05/2009  . Atrial thrombus (HCC)     appendage/notes 09/05/2009  . Type II diabetes mellitus (HCC)   . History of blood transfusion     related to "kidney transplant"  . Chronic kidney disease     s/p renal transplant;  follows with [name]  . GERD (gastroesophageal reflux disease)     hx  . History of stomach ulcers   . Diabetic foot ulcer (HCC)     "left is worse than right" (11/20/2015)   Past Surgical History  Procedure Laterality Date  . Insert / replace / remove pacemaker    . Kidney transplant  December 1999  . Abdominal angiogram  11/30/2012    ABDOMINAL AORTIC ANGIOGRAM   DR ARDIA  . Dialysis fistula creation    . Abdominal aortagram N/A 11/30/2012    Procedure: ABDOMINAL [name];  Surgeon: [name], MD;  Location[facility] CATH LAB;  Service: Cardiovascular;  Laterality: N/A;  . Peripheral vascular catheterization N/A 11/20/2015    Procedure: Abdominal Aortogram;  Surgeon: [name], MD;  Location: [facility] INVASIVE CV LAB;  Service: Cardiovascular;  Laterality: N/A;  . Peripheral vascular catheterization Bilateral 11/20/2015    Procedure: Lower Extremity Angiography;  Surgeon: [name], MD;  Location: [facility] INVASIVE CV LAB;  Service: Cardiovascular;  Laterality: Bilateral;  . Peripheral vascular catheterization  11/20/2015    Procedure: Peripheral Vascular Balloon Angioplasty;  Surgeon: [name], MD;  Location: [facility]  INVASIVE CV LAB;  Service: Cardiovascular;;  . Tonsillectomy    . Appendectomy    . Hernia repair    . Umbilical hernia repair     Family History  Problem Relation Age of Onset  . Adopted: Yes  . Kidney disease Brother     died from  . Kidney disease Sister     died from  . Heart attack Neg Hx   . Stroke Neg Hx    Social History   Social History  . Marital Status: Legally  Separated    Spouse Name: N/A  . Number of Children: N/A  . Years of Education: N/A   Occupational History  . Not on file.   Social History Main Topics  . Smoking status: Former Smoker -- 0.25 packs/day for 10 years    Types: Cigarettes  . Smokeless tobacco: Never Used     Comment: "quit smoking cigarettes in the 1990s"  . Alcohol Use: No  . Drug Use: No  . Sexual Activity: No   Other Topics Concern  . Not on file   Social History Narrative    Physical Exam: Blood pressure 114/73, pulse 42, temperature 97.4 F (36.3 C), temperature source Oral, resp. rate 18, height 5\' 2"  (1.575 m), weight 79 lb 12.9 oz (36.2 kg), SpO2 99 %.  General: Vital signs reviewed. global aphasia , some echolalia HEENT:  Can not continue to  follow commands for EOMI Cardiovascular: 2/6 systolic murmur, regular rate Pulmonary/Chest: unable to examine due to pt discomfort Abdominal: Soft, non-tender, non-distended, BS + Extremities: both feet has casts Neuro: Alert and oriented to name, location. Trying to say president name of obama   Lab results: Basic Metabolic Panel:  Recent Labs  16/10/96 0300 01/13/16 0318  NA 135 135  K 4.2 4.4  CL 103 101  CO2 20* 22  GLUCOSE 305* 344*  BUN 13 22*  CREATININE 0.90 1.12*  CALCIUM 8.0* 8.2*   Liver Function Tests: No results for input(s): AST, ALT, ALKPHOS, BILITOT, PROT, ALBUMIN in the last 72 hours. No results for input(s): LIPASE, AMYLASE in the last 72 hours. No results for input(s): AMMONIA in the last 72 hours. CBC:  Recent Labs  01/12/16 0300  01/13/16 0318  WBC 7.8 9.6  HGB 11.4* 12.3  HCT 35.5* 37.3  MCV 99.4 98.9  PLT 190 205   Cardiac Enzymes: No results for input(s): CKTOTAL, CKMB, CKMBINDEX, TROPONINI in the last 72 hours. BNP: No results for input(s): PROBNP in the last 72 hours. D-Dimer: No results for input(s): DDIMER in the last 72 hours. CBG:  Recent Labs  01/11/16 2312 01/12/16 0724 01/12/16 1133 01/12/16 1553 01/12/16 2124 01/13/16 0743  GLUCAP 249* 293* 324* 280* 289* 317*   Hemoglobin A1C: No results for input(s): HGBA1C in the last 72 hours. Fasting Lipid Panel: No results for input(s): CHOL, HDL, LDLCALC, TRIG, CHOLHDL, LDLDIRECT in the last 72 hours. Thyroid Function Tests: No results for input(s): TSH, T4TOTAL, FREET4, T3FREE, THYROIDAB in the last 72 hours. Anemia Panel: No results for input(s): VITAMINB12, FOLATE, FERRITIN, TIBC, IRON, RETICCTPCT in the last 72 hours. Coagulation:  Recent Labs  01/13/16 0318  LABPROT 17.4*  INR 1.42   Urine Drug Screen: Drugs of Abuse  No results found for: LABOPIA, COCAINSCRNUR, LABBENZ, AMPHETMU, THCU, LABBARB  Alcohol Level: No results for input(s): ETH in the last 72 hours. Urinalysis: No results for input(s): COLORURINE, LABSPEC, PHURINE, GLUCOSEU, HGBUR, BILIRUBINUR, KETONESUR, PROTEINUR, UROBILINOGEN, NITRITE, LEUKOCYTESUR in the last 72 hours.  Invalid input(s): APPERANCEUR   Imaging results:  No results found.  Other results: EKG: normal EKG, normal sinus rhythm, unchanged from previous tracings, atrial fibrillation, rate 75bpm, low voltage recording.  Assessment, Plan, & Recommendations by Problem:   Acute subdural hematoma in a patient with chronic Coumadin use- supratherapeutic with INR around 4.65.- INR rapidly reversed L:  Follow up ct scan suggests decreasing bifrontal acute subdural hematomas, without significant mass effect and no residual midline shift. Also, decreasing left subarachnoid hemorrhage.  Pt's aphasia is  improving, likely  due to increased AED and better seizure control. No further clinical seizure noted. 5/6 EEG showed seizure lasting 3 minutes in left frontal region and periodic epileptiform changes in the left frontal region. A repeat EEG is performed today and results are pending.   I contacted Dr Conchita Paris who recommended contacting Dr Dutch Quint to see if the patient can be weaned off from decadron. Unable to reach so secretary will inform me once she is able to reach him.   -neurosurgery and trauma and neurology following -decadron- continue for now- will adjust once we know -Keppra 1 mg BID   Essential hypertension- blood pressure stable -held amlodipine and other antihypertensives- discontinued metoprolol -labetalol PRN frequency to q2 hours PRN  T2DM: CBGs have been elevated to high 200s, and will likely increase now that patient is eating   -Lantus 6 units , titrate as needed -SSI-S  Atrial fibrillation likely physiologic : Supertherapeutic INR at presentation. Anticoagulation reversal and discontinuation at this time. She does have a history of prior CVA and left atrial appendage thrombus.  -digoxin 0.125 mg daily    Severe PAD with ischemia of LLE and ulceration: Nonpalpable lower extremity pulses. Ulceration of the toes on both feet L > R. Some gangrenous changes of left 4th toe. Per outpatient cardiology recs and recent angioplasty LLE revascularization is unlikely to benefit and agree would benefit from BKA. No sign of infection at this time and lower acuity than new SDH and ramus fracture.  -wound care following  -orthopedics following  HF with severe mitral stenosis- per last echo- EF 60-65%  AKI on Chronic Kidney Disease ;likely stage 1- Patient with baseline creatinine around 1.1, which went up to 1.4 this admission, now trended down to 0.8  -repeat Cr today is 1.1- will trend      Dispo: Disposition is deferred at this time, awaiting improvement of current medical  problems.  The patient does have a current PCP Marva Panda, NP) and needs a hospital follow-up appointment after discharge.  Signed: Deneise Lever, MD 01/13/2016, 11:32 AM

## 2016-01-13 NOTE — Progress Notes (Signed)
Pt having frequent PVCs, asymptomatic. Also had run of 7 PVCs. VSS. MD aware. No orders received. Will continue to monitor.

## 2016-01-13 NOTE — Progress Notes (Signed)
Initial Nutrition Assessment  DOCUMENTATION CODES:   Severe malnutrition in context of chronic illness, Underweight  INTERVENTION:  -Glucerna Shake TID. Each supplement provides 220 kcals and 10 grams of protein.  -Continue to monitor nutritional needs.  NUTRITION DIAGNOSIS:   Malnutrition related to chronic illness as evidenced by severe depletion of body fat, severe depletion of muscle mass.  GOAL:   Patient will meet greater than or equal to 90% of their needs   MONITOR:   PO intake, Supplement acceptance, Labs, Weight trends, Skin, I & O's  REASON FOR ASSESSMENT:    (BMI)    ASSESSMENT:   70 year old woman with multiple medical problems including Insulin-dependent diabetes, ischemic cardiac disease with chronic atrial fibrillation on chronic Coumadin anticoagulation, symptomatic bradycardia status post pacemaker placement, severe mitral stenosis, heart failure with preserved ejection fraction, cerebrovascular disease with previous left MCA stroke , chronic renal disease status post renal transplant in 1999 on chronic immunosuppressive therapy , and severe peripheral vascular disease with ischemic ulcers on the toes of both of her feet currently under evaluation for amputation. She presented with worsening headache after a fall at home. She struck her head on the bedside table.   Pt with   Pt pleasant but unable to respond appropriately to questions at time of visit. Per chart, pt is still aphasic. PO's 0-75%.  RN reports pt ate 40-50% of breakfast and that pt is doing well on the dysphagia diet and thin liquids.  Per chart reviewed, pt has experienced a 21% weight loss since 11/23/15. This is significant for time frame.  Will order Glucerna shake to supplement diet.  NFPE: Severe muscle depletion, severe fat depletion, unable to assess edema.  Labs reviewed; creat 1.12, ca 8.2, GFR 50, CBGs 280-317.  Meds reviewed; Vit D 50,000 IU.   Diet Order:  DIET DYS 3 Room  service appropriate?: Yes; Fluid consistency:: Thin  Skin:  Wound (see comment) (PU on toes, Diabetic PU ankle )  Last BM:  5/4  Height:   Ht Readings from Last 1 Encounters:  01/07/16 5\' 2"  (1.575 m)    Weight:   Wt Readings from Last 1 Encounters:  01/07/16 79 lb 12.9 oz (36.2 kg)    Ideal Body Weight:  50 kg  BMI:  Body mass index is 14.59 kg/(m^2).  Estimated Nutritional Needs:   Kcal:  1500-1650 (30-33 kcals/kg IBW)  Protein:  75 g (1.5 g/kg IBW)  Fluid:  1.5 L  EDUCATION NEEDS:   No education needs identified at this time  Beryle Quant, MS [facility] Dietetic Intern Pager [phone]

## 2016-01-13 NOTE — Progress Notes (Signed)
Trauma Service Note  Subjective: Patient very pleasant and talking today.   Objective: Vital signs in last 24 hours: Temp:  [97.4 F (36.3 C)-98.5 F (36.9 C)] 97.4 F (36.3 C) (05/08 0800) Pulse Rate:  [37-107] 64 (05/08 0905) Resp:  [10-34] 28 (05/08 0900) BP: (110-157)/(62-106) 117/102 mmHg (05/08 0900) SpO2:  [93 %-100 %] 100 % (05/08 0900) Last BM Date: 01/09/16  Intake/Output from previous day: 05/07 0701 - 05/08 0700 In: 2145 [P.O.:240; I.V.:1800; IV Piggyback:105] Out: -  Intake/Output this shift: Total I/O In: 250 [P.O.:100; I.V.:150] Out: -   General: No distress.  Very pleasant  Lungs: Clear  Abd: Soft, good bowel sounds.  Extremities: No changes  Neuro: Oriented to person and place today.  Lab Results: CBC   Recent Labs  01/12/16 0300 01/13/16 0318  WBC 7.8 9.6  HGB 11.4* 12.3  HCT 35.5* 37.3  PLT 190 205   BMET  Recent Labs  01/12/16 0300 01/13/16 0318  NA 135 135  K 4.2 4.4  CL 103 101  CO2 20* 22  GLUCOSE 305* 344*  BUN 13 22*  CREATININE 0.90 1.12*  CALCIUM 8.0* 8.2*   PT/INR  Recent Labs  01/13/16 0318  LABPROT 17.4*  INR 1.42   ABG No results for input(s): PHART, HCO3 in the last 72 hours.  Invalid input(s): PCO2, PO2  Studies/Results: No results found.  Anti-infectives: Anti-infectives    Start     Dose/Rate Route Frequency Ordered Stop   01/08/16 1000  valACYclovir (VALTREX) tablet 500 mg     500 mg Oral Every M-W-F 01/07/16 1751        Assessment/Plan: s/p  Advance diet transfer to NS floor.  LOS: 6 days   Marta Lamas. Gae Bon, MD, FACS [phone] Trauma Surgeon 01/13/2016

## 2016-01-13 NOTE — Procedures (Signed)
History: 70 year old female with traumatic brain injury and seizures  Sedation: None  Technique: This is a 21 channel routine scalp EEG performed at the bedside with bipolar and monopolar montages arranged in accordance to the international 10/20 system of electrode placement. One channel was dedicated to EKG recording.    Background: There is a posterior dominant rhythm of 8.5 Hz which is seen symmetrically. There is also mild generalized irregular delta activity. In addition, there is focal delta activity over the left frontal temporal region with occasional sharp wave discharges in this area.  Photic stimulation: Physiologic driving is now performed  EEG Abnormalities: 1) left frontotemporal sharp wave discharges 2) generalized irregular delta activity  Clinical Interpretation: This EEG is suggestive of a potential area of epileptogenicity the left frontotemporal region. There was no seizure recorded on this study. Overall, this represents an improvement compared to the previous recording.   [name] Slot, MD [facility] [phone]  If 7pm- 7am, please page neurology on call as listed in AMION.

## 2016-01-14 ENCOUNTER — Inpatient Hospital Stay (HOSPITAL_COMMUNITY): Payer: Medicare Other

## 2016-01-14 ENCOUNTER — Ambulatory Visit: Payer: Medicare Other | Admitting: Sports Medicine

## 2016-01-14 DIAGNOSIS — I10 Essential (primary) hypertension: Secondary | ICD-10-CM | POA: Insufficient documentation

## 2016-01-14 DIAGNOSIS — I609 Nontraumatic subarachnoid hemorrhage, unspecified: Secondary | ICD-10-CM | POA: Insufficient documentation

## 2016-01-14 DIAGNOSIS — E43 Unspecified severe protein-calorie malnutrition: Secondary | ICD-10-CM | POA: Insufficient documentation

## 2016-01-14 LAB — GLUCOSE, CAPILLARY
GLUCOSE-CAPILLARY: 341 mg/dL — AB (ref 65–99)
Glucose-Capillary: 288 mg/dL — ABNORMAL HIGH (ref 65–99)
Glucose-Capillary: 271 mg/dL — ABNORMAL HIGH (ref 65–99)
Glucose-Capillary: 204 mg/dL — ABNORMAL HIGH (ref 65–99)

## 2016-01-14 LAB — BASIC METABOLIC PANEL
Anion gap: 18 — ABNORMAL HIGH (ref 5–15)
BUN: 20 mg/dL (ref 6–20)
CHLORIDE: 98 mmol/L — AB (ref 101–111)
CO2: 19 mmol/L — AB (ref 22–32)
CREATININE: 0.79 mg/dL (ref 0.44–1.00)
Calcium: 7.8 mg/dL — ABNORMAL LOW (ref 8.9–10.3)
GFR calc Af Amer: >60 mL/min (ref >60)
GFR calc non Af Amer: >60 mL/min (ref >60)
Glucose, Bld: 319 mg/dL — ABNORMAL HIGH (ref 65–99)
Potassium: 4.2 mmol/L (ref 3.5–5.1)
Sodium: 135 mmol/L (ref 135–145)

## 2016-01-14 MED ORDER — INSULIN GLARGINE 100 UNIT/ML ~~LOC~~ SOLN
9.0000 [IU] | Freq: Every day | SUBCUTANEOUS | Status: DC
  Filled 2016-01-14: qty 0.09

## 2016-01-14 MED ORDER — INSULIN GLARGINE 100 UNIT/ML ~~LOC~~ SOLN
13.0000 [IU] | Freq: Every day | SUBCUTANEOUS | Status: DC
  Administered 2016-01-14 – 2016-01-16 (×3): 13 [IU] via SUBCUTANEOUS
  Filled 2016-01-14 (×3): qty 0.13

## 2016-01-14 MED ORDER — DEXAMETHASONE SODIUM PHOSPHATE 4 MG/ML IJ SOLN
2.0000 mg | Freq: Two times a day (BID) | INTRAMUSCULAR | Status: DC
  Administered 2016-01-14: 2 mg via INTRAVENOUS
  Filled 2016-01-14: qty 1

## 2016-01-14 NOTE — Progress Notes (Signed)
Subjective: Significantly more aphasic this morning.   Exam: Filed Vitals:   01/14/16 0130 01/14/16 0525  BP: 159/76 149/73  Pulse: 69 72  Temp: 97.8 F (36.6 C) 97.5 F (36.4 C)  Resp: 20 20   Gen: In bed, NAD Resp: non-labored breathing, no acute distress Abd: soft, nt  Neuro: MS: awake, alert, does not follow commands.  Or answer questions correctly, repeatedly says "yes" JX:BJYNW, blinks to threat bilaterally Motor: moves all extremities to command Sensory:responds to stim x 4.   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 She has significantly improved with no further clinical seizure noted. Speech greatly improved on treatment, but now has worsened again. I am concenred about possibl echange in ICH vs recurrent subclincial seizures(latter I feel is more likely).   Recommendations: 1) Head CT 2) overnight EEG to quantify seizure activity. 3) continue keppra 1gm BID  Ritta Slot, MD [facility] [phone]  If 7pm- 7am, please page neurology on call as listed in AMION.

## 2016-01-14 NOTE — Progress Notes (Addendum)
Internal Medicine Daily Consult Note   Date: 01/14/2016               Patient Name:  [name] [name] MRN: [id number]  DOB: Dec 12, 1945 Age / Sex: 70 y.o., female   PCP: Marva Panda, NP         Requesting Physician: Dr. Minerva Fester Md, MD    Consulting Reason:  Management of multiple medical comorbidities     Chief Complaint: Subdural hematoma, pubic ramus fracture  Subjective:   Pt's mental status has improved. She is able to give the name of the location, her name, and children. Her children live in [place]. Will attempt to contact them when they are here if they have any questions regarding the care. She still was aphasic, more receptive than expressive and had some expressive echolalia.   I also updated the family at bedside at 2 PM and answered their question about the overall long term course.   Meds: Current Facility-Administered Medications  Medication Dose Route Frequency Provider Last Rate Last Dose  . 0.45 % sodium chloride infusion   Intravenous Continuous Jimmye Norman, MD 10 mL/hr at 01/13/16 2052    . amLODipine (NORVASC) tablet 5 mg  5 mg Oral Daily Jimmye Norman, MD   5 mg at 01/14/16 1054  . antiseptic oral rinse (CPC / CETYLPYRIDINIUM CHLORIDE 0.05%) solution 7 mL  7 mL Mouth Rinse BID Jimmye Norman, MD   7 mL at 01/14/16 1000  . chlorhexidine (PERIDEX) 0.12 % solution 15 mL  15 mL Mouth Rinse BID Jimmye Norman, MD   15 mL at 01/14/16 1054  . cycloSPORINE modified (NEORAL) capsule 25 mg  25 mg Oral BID Harriette Bouillon, MD   25 mg at 01/14/16 1054  . dexamethasone (DECADRON) injection 2 mg  2 mg Intravenous Q12H Deneise Lever, MD      . digoxin (LANOXIN) tablet 0.125 mg  0.125 mg Oral Daily Jimmye Norman, MD   0.125 mg at 01/14/16 1054  . feeding supplement (GLUCERNA SHAKE) (GLUCERNA SHAKE) liquid 237 mL  237 mL Oral TID BM Arlyss Gandy, RD   237 mL at 01/14/16 1053  . HYDROmorphone (DILAUDID) injection 0.5 mg  0.5 mg Intravenous Q2H PRN Harriette Bouillon, MD   0.5 mg at  01/13/16 2341  . hydroxypropyl methylcellulose / hypromellose (ISOPTO TEARS / GONIOVISC) 2.5 % ophthalmic solution 1 drop  1 drop Both Eyes QID PRN Harriette Bouillon, MD      . insulin aspart (novoLOG) injection 0-15 Units  0-15 Units Subcutaneous TID WC Emelia Loron, MD   11 Units at 01/14/16 [phone]  . insulin glargine (LANTUS) injection 13 Units  13 Units Subcutaneous Daily Freeman Caldron, PA-C   13 Units at 01/14/16 1058  . labetalol (NORMODYNE,TRANDATE) injection 10 mg  10 mg Intravenous Q2H PRN Levert Feinstein, MD   10 mg at 01/13/16 2342  . levETIRAcetam (KEPPRA) 1,000 mg in sodium chloride 0.9 % 100 mL IVPB  1,000 mg Intravenous Q12H Rejeana Brock, MD   1,000 mg at 01/14/16 0354  . mycophenolate (CELLCEPT) capsule 500 mg  500 mg Oral BID Harriette Bouillon, MD   500 mg at 01/14/16 1054  . ondansetron (ZOFRAN) tablet 4 mg  4 mg Oral Q6H PRN Harriette Bouillon, MD       Or  . ondansetron (ZOFRAN) injection 4 mg  4 mg Intravenous Q6H PRN Harriette Bouillon, MD      . pravastatin (PRAVACHOL) tablet 20 mg  20 mg Oral QPM  Harriette Bouillon, MD   20 mg at 01/13/16 2045  . [START ON 01/16/2016] predniSONE (DELTASONE) tablet 5 mg  5 mg Oral Daily Jimmye Norman, MD      . valACYclovir (VALTREX) tablet 500 mg  500 mg Oral Q M,W,F Harriette Bouillon, MD   500 mg at 01/13/16 0916  . [START ON 01/20/2016] Vitamin D (Ergocalciferol) (DRISDOL) capsule 50,000 Units  50,000 Units Oral Q30 days Harriette Bouillon, MD        Allergies: Allergies as of 01/07/2016 - Review Complete 01/07/2016  Allergen Reaction Noted  . Bactrim [sulfamethoxazole-trimethoprim] Itching 05/17/2012  . Aspirin Nausea Only 10/29/2015  . Shellfish allergy Hives 11/20/2015  . Codeine Hives and Nausea Only   . Latex Rash 06/19/2013  . Penicillins Nausea And Vomiting and Rash 01/06/2012  . Tape Itching and Rash 11/19/2015   Past Medical History  Diagnosis Date  . Hypertension   . Stroke [facility]) December 2011    left MCA distribution  .  Permanent atrial fibrillation (HCC)     coumadin clinic at [facility]. Has previously required Lovenox->Coumadin bridge.  . Severe mitral stenosis by prior echocardiogram     last echo 07/2012  . Aortic stenosis   . Symptomatic bradycardia     s/p pacer  . Depression   . HSV (herpes simplex virus) infection   . Anemia   . Hyperparathyroidism, primary (HCC)     s/p parathyroidectomy  . PAD (peripheral artery disease) (HCC)     a. s/p RLE angioplasty with [name]. b. (by [name])  L popliteral artery orbital atherectomy and balloon angioplasty & L SFA orbital atherectomy and balloon angioplasty 11/30/12.  [name] PVD (peripheral vascular disease) (HCC)   . Presence of permanent cardiac pacemaker   . High cholesterol   . CHF (congestive heart failure) (HCC)   . TIA (transient ischemic attack) 08/2009    [name] 09/05/2009  . Atrial thrombus (HCC)     appendage/notes 09/05/2009  . Type II diabetes mellitus (HCC)   . History of blood transfusion     related to "kidney transplant"  . Chronic kidney disease     s/p renal transplant;  follows with [name]  . GERD (gastroesophageal reflux disease)     hx  . History of stomach ulcers   . Diabetic foot ulcer (HCC)     "left is worse than right" (11/20/2015)   Past Surgical History  Procedure Laterality Date  . Insert / replace / remove pacemaker    . Kidney transplant  December 1999  . Abdominal angiogram  11/30/2012    ABDOMINAL AORTIC ANGIOGRAM   DR ARDIA  . Dialysis fistula creation    . Abdominal aortagram N/A 11/30/2012    Procedure: ABDOMINAL [name];  Surgeon: [name], MD;  Location: [facility] CATH LAB;  Service: Cardiovascular;  Laterality: N/A;  . Peripheral vascular catheterization N/A 11/20/2015    Procedure: Abdominal Aortogram;  Surgeon: [name], MD;  Location: [facility] INVASIVE CV LAB;  Service: Cardiovascular;  Laterality: N/A;  . Peripheral vascular catheterization Bilateral 11/20/2015    Procedure:  Lower Extremity Angiography;  Surgeon: [name], MD;  Location: [facility] INVASIVE CV LAB;  Service: Cardiovascular;  Laterality: Bilateral;  . Peripheral vascular catheterization  11/20/2015    Procedure: Peripheral Vascular Balloon Angioplasty;  Surgeon: [name], MD;  Location: [facility] INVASIVE CV LAB;  Service: Cardiovascular;;  . Tonsillectomy    . Appendectomy    . Hernia repair    .  Umbilical hernia repair     Family History  Problem Relation Age of Onset  . Adopted: Yes  . Kidney disease Brother     died from  . Kidney disease Sister     died from  . Heart attack Neg Hx   . Stroke Neg Hx    Social History   Social History  . Marital Status: Legally Separated    Spouse Name: N/A  . Number of Children: N/A  . Years of Education: N/A   Occupational History  . Not on file.   Social History Main Topics  . Smoking status: Former Smoker -- 0.25 packs/day for 10 years    Types: Cigarettes  . Smokeless tobacco: Never Used     Comment: "quit smoking cigarettes in the 1990s"  . Alcohol Use: No  . Drug Use: No  . Sexual Activity: No   Other Topics Concern  . Not on file   Social History Narrative    Physical Exam: Blood pressure 175/78, pulse 70, temperature 97.9 F (36.6 C), temperature source Oral, resp. rate 20, height 5\' 2"  (1.575 m), weight 79 lb 12.9 oz (36.2 kg), SpO2 100 %.  General: Vital signs reviewed. aphasia has improved  HEENT:  Can not continue to  follow commands for EOMI, PERRLA Cardiovascular: 2/6 systolic murmur, regular rate Pulmonary/Chest: CTAB Abdominal: Soft, non-tender, non-distended, BS + Extremities: both feet has casts, toes are ischemic  Neuro: Alert and oriented to name, location.   Lab results: Basic Metabolic Panel:  Recent Labs  16/10/96 0318 01/14/16 0639  NA 135 135  K 4.4 4.2  CL 101 98*  CO2 22 19*  GLUCOSE 344* 319*  BUN 22* 20  CREATININE 1.12* 0.79  CALCIUM 8.2* 7.8*   Liver Function Tests: No results  for input(s): AST, ALT, ALKPHOS, BILITOT, PROT, ALBUMIN in the last 72 hours. No results for input(s): LIPASE, AMYLASE in the last 72 hours. No results for input(s): AMMONIA in the last 72 hours. CBC:  Recent Labs  01/12/16 0300 01/13/16 0318  WBC 7.8 9.6  HGB 11.4* 12.3  HCT 35.5* 37.3  MCV 99.4 98.9  PLT 190 205   Cardiac Enzymes: No results for input(s): CKTOTAL, CKMB, CKMBINDEX, TROPONINI in the last 72 hours. BNP: No results for input(s): PROBNP in the last 72 hours. D-Dimer: No results for input(s): DDIMER in the last 72 hours. CBG:  Recent Labs  01/12/16 2124 01/13/16 0743 01/13/16 1104 01/13/16 1615 01/13/16 2123 01/14/16 0528  GLUCAP 289* 317* 287* 291* 276* 341*   Hemoglobin A1C: No results for input(s): HGBA1C in the last 72 hours. Fasting Lipid Panel: No results for input(s): CHOL, HDL, LDLCALC, TRIG, CHOLHDL, LDLDIRECT in the last 72 hours. Thyroid Function Tests: No results for input(s): TSH, T4TOTAL, FREET4, T3FREE, THYROIDAB in the last 72 hours. Anemia Panel: No results for input(s): VITAMINB12, FOLATE, FERRITIN, TIBC, IRON, RETICCTPCT in the last 72 hours. Coagulation:  Recent Labs  01/13/16 0318  LABPROT 17.4*  INR 1.42   Urine Drug Screen: Drugs of Abuse  No results found for: LABOPIA, COCAINSCRNUR, LABBENZ, AMPHETMU, THCU, LABBARB  Alcohol Level: No results for input(s): ETH in the last 72 hours. Urinalysis: No results for input(s): COLORURINE, LABSPEC, PHURINE, GLUCOSEU, HGBUR, BILIRUBINUR, KETONESUR, PROTEINUR, UROBILINOGEN, NITRITE, LEUKOCYTESUR in the last 72 hours.  Invalid input(s): APPERANCEUR   Imaging results:  Ct Head Wo Contrast  01/14/2016  CLINICAL DATA:  Follow-up intracranial hemorrhage. Worsening aphasia. EXAM: CT HEAD WITHOUT CONTRAST TECHNIQUE: Contiguous axial images were  obtained from the base of the skull through the vertex without intravenous contrast. COMPARISON:  Multiple priors, most recent 01/10/2016.  FINDINGS: Acute LEFT subdural hematoma redemonstrated, not demonstrating significant interval improvement compared with the study of 01/10/2016. Maximum thickness in the frontal region up to 10 mm. Parenchymal hematoma, within the LEFT frontal lobe, not significantly changed in size, with measurements of 15 x 24 x 18 mm (R-L x A-P x C-C); this corresponds to a volume of approximately 13 mL. Surrounding edema is increased. The associated subarachnoid blood is redemonstrated but not clearly worse. Mild LEFT-to-RIGHT shift is noted of 2 mm. No hydrocephalus. 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 IMPRESSION: LEFT frontal lobe Parenchymal hematoma roughly stable, 15 x 24 x 18 mm. Surrounding edema is mildly increased. 2 mm LEFT-to-RIGHT shift. Associated subarachnoid and subdural hemorrhage on the LEFT without significant improvement since 01/10/16. Maximum thickness of the subdural hemorrhage in LEFT frontal region up to 10 mm. Electronically Signed   By: Elsie Stain M.D.   On: 01/14/2016 09:44    Assessment, Plan, & Recommendations by Problem:   Acute subdural hematoma in a patient with chronic Coumadin use- supratherapeutic with INR around 4.65.- INR rapidly reversed L:  Follow up ct scan suggests decreasing bifrontal acute subdural hematomas, without significant mass effect and no residual midline shift. Also, decreasing left subarachnoid hemorrhage.  Pt's aphasia is improving, likely due to increased AED and better seizure control. No further clinical seizure noted. 5/6 EEG showed seizure lasting 3 minutes in left frontal region and periodic epileptiform changes in the left frontal region. A repeat EEG is performed today and  Shows improvement. Per neurosurgery, she was more aphasic so ordered repeat CT scan.    -neurosurgery and trauma and neurology following -repeat CT scan -decreased decadron  to 2 mg- will stop the decadron tomorrow  -Keppra 1 mg BID   Essential hypertension- blood pressure stable -held amlodipine and other antihypertensives- discontinued metoprolol -labetalol PRN frequency to q2 hours PRN  T2DM: CBGs have been elevated to high 200s, and will likely increase now that patient is eating . Overnight got 27 units of SSI  -Lantus 9 units -SSI- moderate  Atrial fibrillation likely physiologic : Supertherapeutic INR at presentation. Anticoagulation reversal and discontinuation at this time. She does have a history of prior CVA and left atrial appendage thrombus.  -digoxin 0.125 mg daily    Severe PAD with ischemia of LLE and ulceration: Nonpalpable lower extremity pulses. Ulceration of the toes on both feet L > R. Some gangrenous changes of left 4th toe. Per outpatient cardiology recs and recent angioplasty LLE revascularization is unlikely to benefit and agree would benefit from BKA. No sign of infection at this time and lower acuity than new SDH and ramus fracture. Surgery unlikely to be of benefit.   -wound care following  -orthopedics following  HF with severe mitral stenosis- per last echo- EF 60-65%  AKI on Chronic Kidney Disease ;likely stage 1- Patient with baseline creatinine around 1.1, which went up to 1.4 this admission, now trended down to 0.8     Dispo: Disposition is deferred at this time, awaiting improvement of current medical problems.  The patient does have a current PCP Marva Panda, NP) and needs a hospital follow-up appointment after discharge.  Signed: Deneise Lever, MD 01/14/2016, 11:36 AM

## 2016-01-14 NOTE — Progress Notes (Signed)
Patient ID: [name] [name], female   DOB: [birth year], 70 y.o.   MRN: [id number] Medicine attending: I examined this patient this morning together with resident physician Dr. Deneise Lever and I concur with his evaluation and management plan which we discussed together. Considerable neurologic improvement this morning. Much more lucid. Fluent speech. Able to follow more commands than yesterday. Decreased receptive and expressive aphasia. No seizure activity noted. Follow-up CT brain this morning with a stable 1.5 x 2.4 x 1.8 cm left parietal subdural hematoma. 2 mm left-to-right shift. Additional frontal subarachnoid and subdural dural hemorrhage on the left persistent but unchanged compared with May 5 study. Blood pressure fluctuating but acceptable range. Blood sugar still running high and we will start a rapid Decadron taper today. No change in chronic ischemic and dry gangrenous changes of multiple toes on both feet. Physical therapy recommending [facility] inpatient rehabilitation evaluation.

## 2016-01-14 NOTE — Progress Notes (Signed)
Physical Therapy Treatment Patient Details Name: [name] [name] MRN: [id number] DOB: March 02, 1946 Today's Date: 01/14/2016    History of Present Illness 70 y.o. female admitted to [facility] on 01/07/16 with fall out of bed with resultant L SDH/SAH and L inferior/superior pubic rami fx (ortho consulted and pt is WBAT L leg).  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     PT Comments    Worked on bed mobility this session as pt only able to do bed level activity per RN. Pt able to communicate needs well despite expressive aphasia but followed one step commands inconsistently with need for multimodal cueing. Pt incontinent of urine and nursing staff made aware. Continue to progress as tolerated.   Follow Up Recommendations  CIR     Equipment Recommendations  None recommended by PT    Recommendations for Other Services Rehab consult     Precautions / Restrictions Precautions Precautions: Fall Precaution Comments: watch HR Restrictions Weight Bearing Restrictions: Yes LLE Weight Bearing: Weight bearing as tolerated    Mobility  Bed Mobility Overal bed mobility: Needs Assistance Bed Mobility: Rolling Rolling: Min assist         General bed mobility comments: multiple rolls side to side; use of bed rails to maintain sidelying; assist needed to roll R and L with multimodal cues needed when rolling to L and verbal cues needed for rolling R; pt able to bridge and scoot up in bed without assist  Transfers                 General transfer comment: unable to attempt per RN due to EEG  Ambulation/Gait                 Stairs            Wheelchair Mobility    Modified Rankin (Stroke Patients Only)       Balance                                    Cognition Arousal/Alertness:  Awake/alert Behavior During Therapy: WFL for tasks assessed/performed Overall Cognitive Status: Difficult to assess Area of Impairment: Following commands       Following Commands: Follows one step commands inconsistently       General Comments: follows commands better wtih multimodal cues    Exercises      General Comments General comments (skin integrity, edema, etc.): pt incontinent of urine; nursing staff called to clean pt and change linens      Pertinent Vitals/Pain Pain Assessment: Faces Faces Pain Scale: Hurts even more Pain Location: bilat toes and L hip Pain Descriptors / Indicators: Grimacing;Guarding;Moaning Pain Intervention(s): Limited activity within patient's tolerance;Monitored during session;Repositioned    Home Living                      Prior Function            PT Goals (current goals can now be found in the care plan section) Acute Rehab PT Goals Patient Stated Goal: pt stated "food" and pointed to glucerna Progress towards PT goals: Not progressing toward goals - comment    Frequency  Min 3X/week    PT Plan Current plan remains appropriate  Co-evaluation             End of Session   Activity Tolerance: Patient tolerated treatment well Patient left: in bed;with call bell/phone within reach;with bed alarm set     Time: 4098-1191 PT Time Calculation (min) (ACUTE ONLY): 15 min  Charges:  $Therapeutic Activity: 8-22 mins                    G Codes:      Derek Mound, PTA Pager: [phone]   01/14/2016, 4:32 PM

## 2016-01-14 NOTE — Progress Notes (Signed)
vLTM EEG running. Tested event button. Educated Conservation officer, nature, notified neuro

## 2016-01-14 NOTE — Progress Notes (Signed)
Patient ID: [name] [name], female   DOB: 1945/12/11, 70 y.o.   MRN: [id number]   LOS: 7 days   Subjective: Aphasic but able to give name, location.   Objective: Vital signs in last 24 hours: Temp:  [97.5 F (36.4 C)-98.7 F (37.1 C)] 97.5 F (36.4 C) (05/09 0525) Pulse Rate:  [34-96] 72 (05/09 0525) Resp:  [17-28] 20 (05/09 0525) BP: (114-171)/(73-102) 149/73 mmHg (05/09 0525) SpO2:  [97 %-100 %] 100 % (05/09 0525) Last BM Date: 01/09/16   Laboratory  BMET  Recent Labs  01/13/16 0318 01/14/16 0639  NA 135 135  K 4.4 4.2  CL 101 98*  CO2 22 19*  GLUCOSE 344* 319*  BUN 22* 20  CREATININE 1.12* 0.79  CALCIUM 8.2* 7.8*   CBG (last 3)   Recent Labs  01/13/16 1615 01/13/16 2123 01/14/16 0528  GLUCAP 291* 276* 341*    Physical Exam General appearance: alert and no distress Resp: clear to auscultation bilaterally Cardio: irregularly irregular rhythm GI: normal findings: bowel sounds normal and soft, non-tender Neuro: Ox2, +FC   Assessment/Plan: Fall from bed TBI w/SDH on coumadin s/p rapid reversal -- TBI team. For head CT with worsened aphasia again. Plan for continuous nocturnal EEG if CT not worse. Left pubic rami fxs -- WBAT per Dr. Eulah Pont Multiple medical problems -- per IM, will increase Lantus with CBG elevation. ? LOT on decadron. Left foot gangrenous toes -- Appreciate ortho consult, they recommend no acute intervention FEN -- No issues VTE -- SCD's Dispo -- Head CT    Freeman Caldron, PA-C Pager: [phone] General Trauma PA Pager: [phone]  01/14/2016

## 2016-01-15 DIAGNOSIS — I011 Acute rheumatic endocarditis: Secondary | ICD-10-CM

## 2016-01-15 LAB — GLUCOSE, CAPILLARY
GLUCOSE-CAPILLARY: 258 mg/dL — AB (ref 65–99)
Glucose-Capillary: 189 mg/dL — ABNORMAL HIGH (ref 65–99)
Glucose-Capillary: 184 mg/dL — ABNORMAL HIGH (ref 65–99)
Glucose-Capillary: 109 mg/dL — ABNORMAL HIGH (ref 65–99)

## 2016-01-15 LAB — BASIC METABOLIC PANEL
ANION GAP: 15 (ref 5–15)
BUN: 14 mg/dL (ref 6–20)
CALCIUM: 8.1 mg/dL — AB (ref 8.9–10.3)
CO2: 27 mmol/L (ref 22–32)
Chloride: 96 mmol/L — ABNORMAL LOW (ref 101–111)
Creatinine, Ser: 0.74 mg/dL (ref 0.44–1.00)
GFR calc Af Amer: >60 mL/min (ref >60)
GFR calc non Af Amer: >60 mL/min (ref >60)
GLUCOSE: 230 mg/dL — AB (ref 65–99)
Potassium: 3.5 mmol/L (ref 3.5–5.1)
Sodium: 138 mmol/L (ref 135–145)

## 2016-01-15 MED ORDER — METOPROLOL TARTRATE 25 MG PO TABS
25.0000 mg | ORAL_TABLET | Freq: Two times a day (BID) | ORAL | Status: DC
  Administered 2016-01-15 – 2016-01-16 (×3): 25 mg via ORAL
  Filled 2016-01-15 (×3): qty 1

## 2016-01-15 NOTE — Consult Note (Signed)
   [facility] CM Inpatient Consult   01/15/2016  [name] [name] Apr 17, 1946 [id number]   Patient screened for potential [facility] Care Management services for [facility] ACO Registry. Patient is eligible for [facility] Care Management services under patient's Medicare  plan. Chart review reveals patient is being considered for inpatient rehab for post hospital discharge needs.  Admitted with L SDH/SAH and L inferior/superior pubic rami fx (ortho consulted and pt is WBAT L leg). 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  This is the patient's 2nd admission noted in 6 months.   Please place a [facility] Care Management consult, if this plan changes and community care management is needed or for questions contact:   Charlesetta Shanks, RN BSN CCM [facility]  [phone] business mobile phone Toll free office [phone]

## 2016-01-15 NOTE — Progress Notes (Signed)
Rehab admissions - Noted heart rate elevated with any attempts at out of bed.  Patient will need to be able to tolerate out of bed and hare heart rate under control before I can consider her for inpatient rehab admission.  She is not ready for rehab at this point.  Call me for questions.  #[phone]

## 2016-01-15 NOTE — Progress Notes (Signed)
Subjective: Awake and showing no overt seizure. Still expressively aphasic. Able to repeat, name objects (tends to perseverate on object), unable to express and follow verbal commands.   Exam: Filed Vitals:   01/15/16 1035 01/15/16 1037  BP: 144/101 146/107  Pulse: 84   Temp: 98.2 F (36.8 C)   Resp: 22        Gen: In bed, NAD MS: alert, able to follow visual commands, verbal as above.  OE:VOJJK, blinks to threat bilaterally Motor: moves all extremities to command Sensory:responds to stim x 4.   Pertinent Labs/Diagnostics: IMPRESSION CT repeat: LEFT frontal lobe Parenchymal hematoma roughly stable, 15 x 24 x 18 mm. Surrounding edema is mildly increased. 2 mm LEFT-to-RIGHT shift.  Associated subarachnoid and subdural hemorrhage on the LEFT without significant improvement since 01/10/16. Maximum thickness of the subdural hemorrhage in LEFT frontal region up to 10 mm.  Felicie Morn PA-C [facility] [phone]  She follows commands and tells me her name.   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 She has significantly improved with no further clinical seizure noted. I susepct the worsening I saw yesterday was due to drowsiness. She appears more like previous days today. No indication of subclincial seizures on overnight monitoring.   Her seizures have now been under control for multiple days.   Recommendations: 1) continue keppra 1gm BID 2) No further recommendations at this time. Please call with any further questions or concerns. Neurology will sign off.   Ritta Slot, MD [facility] [phone]  If 7pm- 7am, please page neurology on call as listed in AMION.   01/15/2016, 10:44 AM

## 2016-01-15 NOTE — Progress Notes (Signed)
Patient ID: [name] [name], female   DOB: [birth year], 70 y.o.   MRN: [id number] Medicine attending: Management discussed at the bedside with resident physician Dr. Deneise Lever and I concur with his treatment plan. Slow but continued neurologic progress. Still with partial expressive and receptive aphasia. Hypertension: Stable to discontinue IV beta blocker and continue oral metoprolol. Type 2 diabetes: Now receiving Lantus insulin 9 units. Blood sugars coming down as the Decadron stopped. Chronic atrial fibrillation: It is not unexpected that when she ambulates her heart rate will go up. Heart rates are controlled at rest on current regimen. Anticoagulation discontinued in view of subdural and subarachnoid hemorrhage after fall at home and finding of supratherapeutic INR. I do not think that there is a absolute contraindication to resuming anticoagulation at some point in the near future with close monitoring. Peripheral vascular disease with ischemic and gangrenous changes of the toes on both feet. Initial plan for amputations. Deferred at current time in view of complications noted above. Overall medically stable for transfer to inpatient rehabilitation.

## 2016-01-15 NOTE — Progress Notes (Signed)
Aphasia slowly improving. No other significant neurologic issues. Continue to hold Coumadin indefinitely. Contact me for further needs.

## 2016-01-15 NOTE — Progress Notes (Signed)
Speech Language Pathology Treatment: Cognitive-Linquistic  Patient Details Name: [name] [name] MRN: [id number] DOB: Sep 07, 1946 Today's Date: 01/15/2016 Time: 9030-0923 SLP Time Calculation (min) (ACUTE ONLY): 24 min  Assessment / Plan / Recommendation Clinical Impression  Pt has improved significantly since initial SLE; able to follow 2-3 step commands given minimal verbal/visual cues with 80% accuracy; named common items within environment with 65% accuracy; answered personal information questions with 50% accuracy; utilized MIT with answering questions using phrases with 50% accuracy as well; pt emotionally labile within session intermittently, but was responsive to change of activity to redirect this behavior; updated goals on POC; recommend CIR for continued therapy as pt is progressing with all goals for ST.   HPI HPI: 70 y.o. female admitted after falling out of bed and sustaining SDH/SAH, left inf/sup rami fractures.  CCT small left convexity SDH, left SAH; aphasia. 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       SLP Plan  Continue with current plan of care     Recommendations   CIR             General recommendations: Rehab consult Follow up Recommendations: Inpatient Rehab Plan: Continue with current plan of care                     ADAMS,PAT, M.S., CCC-SLP 01/15/2016, 10:41 AM

## 2016-01-15 NOTE — Progress Notes (Signed)
Internal Medicine Daily Consult Note   Date: 01/15/2016               Patient Name:  [name] [name] MRN: [id number]  DOB: October 01, 1945 Age / Sex: 70 y.o., female   PCP: Marva Panda, NP         Requesting Physician: Dr. Minerva Fester Md, MD    Consulting Reason:  Management of multiple medical comorbidities     Chief Complaint: Subdural hematoma, pubic ramus fracture  Subjective:   Pt's mental status continues to show improvement and she was eating breakfast. She is able to pronounce her name, the location, and the year.  I also updated the family at bedside yesterday and answered their question about the overall long term course.  PT recommending inpatient rehab. She was evaluated by PM&R and they noted that her heart rate had increased while she was going out of the bed.   Meds: Current Facility-Administered Medications  Medication Dose Route Frequency Provider Last Rate Last Dose  . 0.45 % sodium chloride infusion   Intravenous Continuous Jimmye Norman, MD 10 mL/hr at 01/13/16 2052    . amLODipine (NORVASC) tablet 5 mg  5 mg Oral Daily Jimmye Norman, MD   5 mg at 01/15/16 1024  . antiseptic oral rinse (CPC / CETYLPYRIDINIUM CHLORIDE 0.05%) solution 7 mL  7 mL Mouth Rinse BID Jimmye Norman, MD   7 mL at 01/15/16 1000  . chlorhexidine (PERIDEX) 0.12 % solution 15 mL  15 mL Mouth Rinse BID Jimmye Norman, MD   15 mL at 01/15/16 1025  . cycloSPORINE modified (NEORAL) capsule 25 mg  25 mg Oral BID Harriette Bouillon, MD   25 mg at 01/15/16 1024  . digoxin (LANOXIN) tablet 0.125 mg  0.125 mg Oral Daily Jimmye Norman, MD   0.125 mg at 01/15/16 1024  . feeding supplement (GLUCERNA SHAKE) (GLUCERNA SHAKE) liquid 237 mL  237 mL Oral TID BM Arlyss Gandy, RD   237 mL at 01/15/16 1000  . HYDROmorphone (DILAUDID) injection 0.5 mg  0.5 mg Intravenous Q2H PRN Harriette Bouillon, MD   0.5 mg at 01/13/16 2341  . hydroxypropyl methylcellulose / hypromellose (ISOPTO TEARS / GONIOVISC) 2.5 % ophthalmic solution 1 drop  1  drop Both Eyes QID PRN Harriette Bouillon, MD      . insulin aspart (novoLOG) injection 0-15 Units  0-15 Units Subcutaneous TID WC Emelia Loron, MD   8 Units at 01/15/16 [id number]  . insulin glargine (LANTUS) injection 13 Units  13 Units Subcutaneous Daily Freeman Caldron, PA-C   13 Units at 01/15/16 1024  . levETIRAcetam (KEPPRA) 1,000 mg in sodium chloride 0.9 % 100 mL IVPB  1,000 mg Intravenous Q12H Rejeana Brock, MD   1,000 mg at 01/15/16 1191  . metoprolol tartrate (LOPRESSOR) tablet 25 mg  25 mg Oral BID Freeman Caldron, PA-C      . mycophenolate (CELLCEPT) capsule 500 mg  500 mg Oral BID Harriette Bouillon, MD   500 mg at 01/15/16 1024  . ondansetron (ZOFRAN) tablet 4 mg  4 mg Oral Q6H PRN Harriette Bouillon, MD       Or  . ondansetron [facility]) injection 4 mg  4 mg Intravenous Q6H PRN Harriette Bouillon, MD      . pravastatin (PRAVACHOL) tablet 20 mg  20 mg Oral QPM Harriette Bouillon, MD   20 mg at 01/14/16 1753  . [START ON 01/16/2016] predniSONE (DELTASONE) tablet 5 mg  5 mg Oral Daily Jimmye Norman, MD      .  valACYclovir (VALTREX) tablet 500 mg  500 mg Oral Q M,W,F Harriette Bouillon, MD   500 mg at 01/15/16 1024  . [START ON 01/20/2016] Vitamin D (Ergocalciferol) (DRISDOL) capsule 50,000 Units  50,000 Units Oral Q30 days Harriette Bouillon, MD        Allergies: Allergies as of 01/07/2016 - Review Complete 01/07/2016  Allergen Reaction Noted  . Bactrim [sulfamethoxazole-trimethoprim] Itching 05/17/2012  . Aspirin Nausea Only 10/29/2015  . Shellfish allergy Hives 11/20/2015  . Codeine Hives and Nausea Only   . Latex Rash 06/19/2013  . Penicillins Nausea And Vomiting and Rash 01/06/2012  . Tape Itching and Rash 11/19/2015   Past Medical History  Diagnosis Date  . Hypertension   . Stroke [facility]) December 2011    left MCA distribution  . Permanent atrial fibrillation (HCC)     coumadin clinic at [facility]. Has previously required Lovenox->Coumadin bridge.  . Severe mitral stenosis by  prior echocardiogram     last echo 07/2012  . Aortic stenosis   . Symptomatic bradycardia     s/p pacer  . Depression   . HSV (herpes simplex virus) infection   . Anemia   . Hyperparathyroidism, primary (HCC)     s/p parathyroidectomy  . PAD (peripheral artery disease) (HCC)     a. s/p RLE angioplasty with [name]. b. (by [name])  L popliteral artery orbital atherectomy and balloon angioplasty & L SFA orbital atherectomy and balloon angioplasty 11/30/12.  [name] PVD (peripheral vascular disease) (HCC)   . Presence of permanent cardiac pacemaker   . High cholesterol   . CHF (congestive heart failure) (HCC)   . TIA (transient ischemic attack) 08/2009    [name] 09/05/2009  . Atrial thrombus (HCC)     appendage/notes 09/05/2009  . Type II diabetes mellitus (HCC)   . History of blood transfusion     related to "kidney transplant"  . Chronic kidney disease     s/p renal transplant;  follows with [name]  . GERD (gastroesophageal reflux disease)     hx  . History of stomach ulcers   . Diabetic foot ulcer (HCC)     "left is worse than right" (11/20/2015)   Past Surgical History  Procedure Laterality Date  . Insert / replace / remove pacemaker    . Kidney transplant  December 1999  . Abdominal angiogram  11/30/2012    ABDOMINAL AORTIC ANGIOGRAM   DR ARDIA  . Dialysis fistula creation    . Abdominal aortagram N/A 11/30/2012    Procedure: ABDOMINAL [name];  Surgeon: [name], MD;  Location: [facility] CATH LAB;  Service: Cardiovascular;  Laterality: N/A;  . Peripheral vascular catheterization N/A 11/20/2015    Procedure: Abdominal Aortogram;  Surgeon: [name], MD;  Location: [facility] INVASIVE CV LAB;  Service: Cardiovascular;  Laterality: N/A;  . Peripheral vascular catheterization Bilateral 11/20/2015    Procedure: Lower Extremity Angiography;  Surgeon: [name], MD;  Location: [facility] INVASIVE CV LAB;  Service: Cardiovascular;  Laterality: Bilateral;  . Peripheral  vascular catheterization  11/20/2015    Procedure: Peripheral Vascular Balloon Angioplasty;  Surgeon: [name], MD;  Location: [facility] INVASIVE CV LAB;  Service: Cardiovascular;;  . Tonsillectomy    . Appendectomy    . Hernia repair    . Umbilical hernia repair     Family History  Problem Relation Age of Onset  . Adopted: Yes  . Kidney disease Brother     died from  . Kidney  disease Sister     died from  . Heart attack Neg Hx   . Stroke Neg Hx    Social History   Social History  . Marital Status: Legally Separated    Spouse Name: N/A  . Number of Children: N/A  . Years of Education: N/A   Occupational History  . Not on file.   Social History Main Topics  . Smoking status: Former Smoker -- 0.25 packs/day for 10 years    Types: Cigarettes  . Smokeless tobacco: Never Used     Comment: "quit smoking cigarettes in the 1990s"  . Alcohol Use: No  . Drug Use: No  . Sexual Activity: No   Other Topics Concern  . Not on file   Social History Narrative    Physical Exam: Blood pressure 129/76, pulse 89, temperature 98.2 F (36.8 C), temperature source Oral, resp. rate 20, height 5\' 2"  (1.575 m), weight 79 lb 12.9 oz (36.2 kg), SpO2 98 %.  General: Vital signs reviewed. some echolalia but fluent speech , eating breakfast when examined  HEENT:  PERRLA Cardiovascular: 2/6 systolic murmur, regular rate Pulmonary/Chest: CTAB Abdominal: Soft, non-tender, non-distended, BS + Extremities: both feet has casts, toes are ischemic  Neuro: Alert and oriented to name, location.   Lab results: Basic Metabolic Panel:  Recent Labs  45/40/98 0639 01/15/16 0727  NA 135 138  K 4.2 3.5  CL 98* 96*  CO2 19* 27  GLUCOSE 319* 230*  BUN 20 14  CREATININE 0.79 0.74  CALCIUM 7.8* 8.1*   Liver Function Tests: No results for input(s): AST, ALT, ALKPHOS, BILITOT, PROT, ALBUMIN in the last 72 hours. No results for input(s): LIPASE, AMYLASE in the last 72 hours. No results for  input(s): AMMONIA in the last 72 hours. CBC:  Recent Labs  01/13/16 0318  WBC 9.6  HGB 12.3  HCT 37.3  MCV 98.9  PLT 205   Cardiac Enzymes: No results for input(s): CKTOTAL, CKMB, CKMBINDEX, TROPONINI in the last 72 hours. BNP: No results for input(s): PROBNP in the last 72 hours. D-Dimer: No results for input(s): DDIMER in the last 72 hours. CBG:  Recent Labs  01/13/16 2123 01/14/16 0528 01/14/16 1138 01/14/16 1634 01/14/16 2149 01/15/16 0555  GLUCAP 276* 341* 288* 271* 204* 258*   Hemoglobin A1C: No results for input(s): HGBA1C in the last 72 hours. Fasting Lipid Panel: No results for input(s): CHOL, HDL, LDLCALC, TRIG, CHOLHDL, LDLDIRECT in the last 72 hours. Thyroid Function Tests: No results for input(s): TSH, T4TOTAL, FREET4, T3FREE, THYROIDAB in the last 72 hours. Anemia Panel: No results for input(s): VITAMINB12, FOLATE, FERRITIN, TIBC, IRON, RETICCTPCT in the last 72 hours. Coagulation:  Recent Labs  01/13/16 0318  LABPROT 17.4*  INR 1.42   Urine Drug Screen: Drugs of Abuse  No results found for: LABOPIA, COCAINSCRNUR, LABBENZ, AMPHETMU, THCU, LABBARB  Alcohol Level: No results for input(s): ETH in the last 72 hours. Urinalysis: No results for input(s): COLORURINE, LABSPEC, PHURINE, GLUCOSEU, HGBUR, BILIRUBINUR, KETONESUR, PROTEINUR, UROBILINOGEN, NITRITE, LEUKOCYTESUR in the last 72 hours.  Invalid input(s): APPERANCEUR   Imaging results:  Ct Head Wo Contrast  01/14/2016  CLINICAL DATA:  Follow-up intracranial hemorrhage. Worsening aphasia. EXAM: CT HEAD WITHOUT CONTRAST TECHNIQUE: Contiguous axial images were obtained from the base of the skull through the vertex without intravenous contrast. COMPARISON:  Multiple priors, most recent 01/10/2016. FINDINGS: Acute LEFT subdural hematoma redemonstrated, not demonstrating significant interval improvement compared with the study of 01/10/2016. Maximum thickness in the frontal  region up to 10 mm.  Parenchymal hematoma, within the LEFT frontal lobe, not significantly changed in size, with measurements of 15 x 24 x 18 mm (R-L x A-P x C-C); this corresponds to a volume of approximately 13 mL. Surrounding edema is increased. The associated subarachnoid blood is redemonstrated but not clearly worse. Mild LEFT-to-RIGHT shift is noted of 2 mm. No hydrocephalus. 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 IMPRESSION: LEFT frontal lobe Parenchymal hematoma roughly stable, 15 x 24 x 18 mm. Surrounding edema is mildly increased. 2 mm LEFT-to-RIGHT shift. Associated subarachnoid and subdural hemorrhage on the LEFT without significant improvement since 01/10/16. Maximum thickness of the subdural hemorrhage in LEFT frontal region up to 10 mm. Electronically Signed   By: Elsie Stain M.D.   On: 01/14/2016 09:44    Assessment, Plan, & Recommendations by Problem:   Acute subdural hematoma in a patient with chronic Coumadin use- supratherapeutic with INR around 4.65.- INR rapidly reversed L:  Pt's aphasia is improving, likely due to increased AED and better seizure control. No further clinical seizure noted. 5/6 EEG showed seizure lasting 3 minutes in left frontal region and periodic epileptiform changes in the left frontal region. A repeat EEG was performed and  Showed improvement. Per neurosurgery, she was more aphasic so ordered repeat CT scan which shows stable hematoma. She continues to show improvement in her speech and had fluent speech today.    -neurosurgery and trauma and neurology following -stopped decadron today  -Keppra 1 mg BID   Essential hypertension- blood pressure stable. Pt has not been receiving PO antiHTNs.  -stopped IV labetalol, and continue PO metoprolol 25 mg bid  T2DM: CBGs have been elevated to mid-200s. Given that the decadron is stopped today, the CBGs will come down, so we  will continue lantus 9 units.   -Lantus 9 units -SSI- moderate  Atrial fibrillation likely physiologic : Supertherapeutic INR at presentation. Anticoagulation reversal and discontinuation at this time. She does have a history of prior CVA and left atrial appendage thrombus. Per neurosurgery, coumadin will be held indefinitely.  -digoxin 0.125 mg daily  - stop coumadin on discharge and holding indefinitely    HF with severe mitral stenosis- per last echo- EF 60-65%  AKI on Chronic Kidney Disease ;likely stage 1- Patient with baseline creatinine around 1.1, which went up to 1.4 this admission, now trended down to 0.8  Disposition: PT recommending CIR.   The patient does have a current PCP Marva Panda, NP) and needs a hospital follow-up appointment after discharge.  Signed: Deneise Lever, MD 01/15/2016, 10:32 AM

## 2016-01-15 NOTE — Progress Notes (Signed)
Patient ID: [name] [name], female   DOB: [birth year], 70 y.o.   MRN: [id number]   LOS: 8 days   Subjective: Very animated and happy this morning. Expressive aphasia a bit more pronounced than yesterday.   Objective: Vital signs in last 24 hours: Temp:  [97.3 F (36.3 C)-98.6 F (37 C)] 98.2 F (36.8 C) (05/10 0551) Pulse Rate:  [70-89] 89 (05/10 0551) Resp:  [20] 20 (05/10 0551) BP: (123-175)/(71-79) 129/76 mmHg (05/10 0551) SpO2:  [98 %-100 %] 98 % (05/10 0551) Last BM Date: 01/09/16   Laboratory  BMET  Recent Labs  01/14/16 0639 01/15/16 0727  NA 135 138  K 4.2 3.5  CL 98* 96*  CO2 19* 27  GLUCOSE 319* 230*  BUN 20 14  CREATININE 0.79 0.74  CALCIUM 7.8* 8.1*   CBG (last 3)   Recent Labs  01/14/16 1634 01/14/16 2149 01/15/16 0555  GLUCAP 271* 204* 258*    Physical Exam General appearance: alert and no distress Resp: clear to auscultation bilaterally Cardio: regular rate and rhythm GI: normal findings: bowel sounds normal and soft, non-tender   Assessment/Plan: Fall from bed TBI w/SDH on coumadin s/p rapid reversal -- TBI team. Undergoing continuous EEG. Left pubic rami fxs -- WBAT per Dr. Eulah Pont Multiple medical problems -- per IM Left foot gangrenous toes -- Appreciate ortho consult, they recommend no acute intervention FEN -- No issues VTE -- SCD's Dispo -- CIR when bed available    Freeman Caldron, PA-C Pager: [phone] General Trauma PA Pager: [phone]  01/15/2016

## 2016-01-15 NOTE — Progress Notes (Signed)
vLTM EEG complete. No skin breakdown. No event push buttons overnight. Results pending

## 2016-01-15 NOTE — Procedures (Signed)
  Electroencephalogram report- LTM   Ordering Physician : DR Amada Jupiter  EEG number: [id number]  Data acquisition: 10-20 electrode placement.  Additional T1, T2, and EKG electrodes; 26 channel digital referential acquisition reformatted to 18 channel/7 channel coronal bipolar     Beginning time:   01/14/16 at 10 33 21 am  Ending time: 01/15/16 at 08 32 45 am   Day of study: day 1    This 24 hours of intensive EEG monitoring with simultaneous video monitoring was performed for this patient with spells of AMS  as a part of ongoing series to capture events of interest and determine if these are seizures and rule our subclinical seizures to explain his symptoms.    Medications: Keppra prednisone vitamin D labetalol digoxin  There was no pushbutton activations events during this recording.   Waking background activities were marked by 6-7 cps disorganized background activities with admixed generalized delta slowing  Superimposed occasional left frontotemporal sharp waves and spikes were present throughout the recording.  Independent polymorphic irregular delta slowing was present across right frontotemporal regions throughout the recording however without well defined  interictal epileptiform discharges or subclinical seizures.   Clinical interpretation: This 24 hours of intensive EEG monitoring with simultaneous monitoring was abnormal for several reasons: #1 background activity slowing and disorganization suggestive of a mild encephalopathy of nonspecific etiologies including toxic metabolic pharmacologic or multifocal degenerative etiologies. #2 right frontotemporal irregular delta slowing was present throughout the recording and may be suggestive of neuronal dysfunction on a structural vascular or degenerative basis in that region. #3 left frontotemporal sharp waves and spikes suggestive of neuronal dysfunction and cortical irritability in that region.  There is no clinical or subclinical  seizures present.

## 2016-01-15 NOTE — Care Management Important Message (Signed)
Important Message  Patient Details  Name: [name] [name] MRN: [id number] Date of Birth: 11/03/1945   Medicare Important Message Given:  Yes    Kallon Caylor P Abdulahad Mederos 01/15/2016, 1:40 PM

## 2016-01-16 ENCOUNTER — Inpatient Hospital Stay (HOSPITAL_COMMUNITY)
Admission: RE | Admit: 2016-01-16 | Discharge: 2016-01-30 | DRG: 949 | Disposition: A | Discharge status: Skilled Nursing Facility | Payer: Medicare Other | Source: Intra-hospital | Attending: Physical Medicine & Rehabilitation | Admitting: Physical Medicine & Rehabilitation

## 2016-01-16 DIAGNOSIS — E1165 Type 2 diabetes mellitus with hyperglycemia: Secondary | ICD-10-CM | POA: Diagnosis not present

## 2016-01-16 DIAGNOSIS — I080 Rheumatic disorders of both mitral and aortic valves: Secondary | ICD-10-CM | POA: Diagnosis present

## 2016-01-16 DIAGNOSIS — Z87891 Personal history of nicotine dependence: Secondary | ICD-10-CM | POA: Diagnosis not present

## 2016-01-16 DIAGNOSIS — I480 Paroxysmal atrial fibrillation: Secondary | ICD-10-CM | POA: Diagnosis present

## 2016-01-16 DIAGNOSIS — I10 Essential (primary) hypertension: Secondary | ICD-10-CM | POA: Diagnosis present

## 2016-01-16 DIAGNOSIS — S069XAA Unspecified intracranial injury with loss of consciousness status unknown, initial encounter: Secondary | ICD-10-CM | POA: Diagnosis present

## 2016-01-16 DIAGNOSIS — R4701 Aphasia: Secondary | ICD-10-CM | POA: Diagnosis present

## 2016-01-16 DIAGNOSIS — R7989 Other specified abnormal findings of blood chemistry: Secondary | ICD-10-CM | POA: Diagnosis not present

## 2016-01-16 DIAGNOSIS — S069X4S Unspecified intracranial injury with loss of consciousness of 6 hours to 24 hours, sequela: Secondary | ICD-10-CM | POA: Diagnosis not present

## 2016-01-16 DIAGNOSIS — E1152 Type 2 diabetes mellitus with diabetic peripheral angiopathy with gangrene: Secondary | ICD-10-CM | POA: Diagnosis present

## 2016-01-16 DIAGNOSIS — E1122 Type 2 diabetes mellitus with diabetic chronic kidney disease: Secondary | ICD-10-CM | POA: Diagnosis present

## 2016-01-16 DIAGNOSIS — Z794 Long term (current) use of insulin: Secondary | ICD-10-CM

## 2016-01-16 DIAGNOSIS — Z7952 Long term (current) use of systemic steroids: Secondary | ICD-10-CM | POA: Diagnosis not present

## 2016-01-16 DIAGNOSIS — Z9862 Peripheral vascular angioplasty status: Secondary | ICD-10-CM

## 2016-01-16 DIAGNOSIS — Z79899 Other long term (current) drug therapy: Secondary | ICD-10-CM

## 2016-01-16 DIAGNOSIS — Z8673 Personal history of transient ischemic attack (TIA), and cerebral infarction without residual deficits: Secondary | ICD-10-CM | POA: Diagnosis not present

## 2016-01-16 DIAGNOSIS — E11621 Type 2 diabetes mellitus with foot ulcer: Secondary | ICD-10-CM | POA: Diagnosis present

## 2016-01-16 DIAGNOSIS — S32592D Other specified fracture of left pubis, subsequent encounter for fracture with routine healing: Secondary | ICD-10-CM

## 2016-01-16 DIAGNOSIS — I129 Hypertensive chronic kidney disease with stage 1 through stage 4 chronic kidney disease, or unspecified chronic kidney disease: Secondary | ICD-10-CM | POA: Diagnosis present

## 2016-01-16 DIAGNOSIS — Z7902 Long term (current) use of antithrombotics/antiplatelets: Secondary | ICD-10-CM

## 2016-01-16 DIAGNOSIS — S069X9A Unspecified intracranial injury with loss of consciousness of unspecified duration, initial encounter: Secondary | ICD-10-CM | POA: Diagnosis present

## 2016-01-16 DIAGNOSIS — R32 Unspecified urinary incontinence: Secondary | ICD-10-CM | POA: Diagnosis present

## 2016-01-16 DIAGNOSIS — R569 Unspecified convulsions: Secondary | ICD-10-CM | POA: Diagnosis present

## 2016-01-16 DIAGNOSIS — W06XXXD Fall from bed, subsequent encounter: Secondary | ICD-10-CM | POA: Diagnosis present

## 2016-01-16 DIAGNOSIS — L97529 Non-pressure chronic ulcer of other part of left foot with unspecified severity: Secondary | ICD-10-CM | POA: Diagnosis present

## 2016-01-16 DIAGNOSIS — D649 Anemia, unspecified: Secondary | ICD-10-CM | POA: Diagnosis present

## 2016-01-16 DIAGNOSIS — N189 Chronic kidney disease, unspecified: Secondary | ICD-10-CM | POA: Diagnosis present

## 2016-01-16 DIAGNOSIS — S065X0D Traumatic subdural hemorrhage without loss of consciousness, subsequent encounter: Secondary | ICD-10-CM | POA: Diagnosis present

## 2016-01-16 DIAGNOSIS — S069X3S Unspecified intracranial injury with loss of consciousness of 1 hour to 5 hours 59 minutes, sequela: Secondary | ICD-10-CM | POA: Diagnosis not present

## 2016-01-16 DIAGNOSIS — E11649 Type 2 diabetes mellitus with hypoglycemia without coma: Secondary | ICD-10-CM | POA: Diagnosis present

## 2016-01-16 DIAGNOSIS — S066X0D Traumatic subarachnoid hemorrhage without loss of consciousness, subsequent encounter: Secondary | ICD-10-CM

## 2016-01-16 DIAGNOSIS — S32810A Multiple fractures of pelvis with stable disruption of pelvic ring, initial encounter for closed fracture: Secondary | ICD-10-CM

## 2016-01-16 DIAGNOSIS — L97519 Non-pressure chronic ulcer of other part of right foot with unspecified severity: Secondary | ICD-10-CM | POA: Diagnosis present

## 2016-01-16 DIAGNOSIS — I482 Chronic atrial fibrillation, unspecified: Secondary | ICD-10-CM | POA: Diagnosis present

## 2016-01-16 DIAGNOSIS — B009 Herpesviral infection, unspecified: Secondary | ICD-10-CM | POA: Diagnosis present

## 2016-01-16 DIAGNOSIS — Z940 Kidney transplant status: Secondary | ICD-10-CM

## 2016-01-16 DIAGNOSIS — R269 Unspecified abnormalities of gait and mobility: Secondary | ICD-10-CM | POA: Diagnosis present

## 2016-01-16 DIAGNOSIS — S069X2S Unspecified intracranial injury with loss of consciousness of 31 minutes to 59 minutes, sequela: Secondary | ICD-10-CM | POA: Diagnosis not present

## 2016-01-16 DIAGNOSIS — Z418 Encounter for other procedures for purposes other than remedying health state: Secondary | ICD-10-CM | POA: Diagnosis not present

## 2016-01-16 DIAGNOSIS — Z681 Body mass index (BMI) 19 or less, adult: Secondary | ICD-10-CM

## 2016-01-16 DIAGNOSIS — Z950 Presence of cardiac pacemaker: Secondary | ICD-10-CM

## 2016-01-16 DIAGNOSIS — S069X9S Unspecified intracranial injury with loss of consciousness of unspecified duration, sequela: Secondary | ICD-10-CM

## 2016-01-16 DIAGNOSIS — E46 Unspecified protein-calorie malnutrition: Secondary | ICD-10-CM | POA: Diagnosis present

## 2016-01-16 LAB — GLUCOSE, CAPILLARY
GLUCOSE-CAPILLARY: 58 mg/dL — AB (ref 65–99)
GLUCOSE-CAPILLARY: 74 mg/dL (ref 65–99)
GLUCOSE-CAPILLARY: 287 mg/dL — AB (ref 65–99)
Glucose-Capillary: 71 mg/dL (ref 65–99)
Glucose-Capillary: 99 mg/dL (ref 65–99)

## 2016-01-16 MED ORDER — DIPHENHYDRAMINE HCL 12.5 MG/5ML PO ELIX: 12.5000 mg | ORAL_SOLUTION | Freq: Four times a day (QID) | ORAL | Status: DC | PRN

## 2016-01-16 MED ORDER — PRO-STAT SUGAR FREE PO LIQD
30.0000 mL | Freq: Two times a day (BID) | ORAL | Status: DC
  Administered 2016-01-16 – 2016-01-17 (×2): 30 mL via ORAL
  Filled 2016-01-16 (×2): qty 30

## 2016-01-16 MED ORDER — PROCHLORPERAZINE EDISYLATE 5 MG/ML IJ SOLN
5.0000 mg | Freq: Four times a day (QID) | INTRAMUSCULAR | Status: DC | PRN
Start: 2016-01-16 — End: 2016-01-30

## 2016-01-16 MED ORDER — SENNOSIDES-DOCUSATE SODIUM 8.6-50 MG PO TABS: 1.0000 | ORAL_TABLET | Freq: Every evening | ORAL | Status: DC | PRN

## 2016-01-16 MED ORDER — GUAIFENESIN-DM 100-10 MG/5ML PO SYRP: 5.0000 mL | ORAL_SOLUTION | Freq: Four times a day (QID) | ORAL | Status: DC | PRN

## 2016-01-16 MED ORDER — HYPROMELLOSE (GONIOSCOPIC) 2.5 % OP SOLN
1.0000 [drp] | Freq: Four times a day (QID) | OPHTHALMIC | Status: DC | PRN
  Filled 2016-01-16: qty 15

## 2016-01-16 MED ORDER — PRAVASTATIN SODIUM 20 MG PO TABS
20.0000 mg | ORAL_TABLET | Freq: Every evening | ORAL | Status: DC
  Administered 2016-01-16 – 2016-01-29 (×14): 20 mg via ORAL
  Filled 2016-01-16 (×15): qty 1

## 2016-01-16 MED ORDER — VALACYCLOVIR HCL 500 MG PO TABS
500.0000 mg | ORAL_TABLET | ORAL | Status: DC
  Administered 2016-01-17 – 2016-01-29 (×6): 500 mg via ORAL
  Filled 2016-01-16 (×6): qty 1

## 2016-01-16 MED ORDER — METOPROLOL TARTRATE 25 MG PO TABS
25.0000 mg | ORAL_TABLET | Freq: Two times a day (BID) | ORAL | Status: DC
  Administered 2016-01-16 – 2016-01-30 (×25): 25 mg via ORAL
  Filled 2016-01-16 (×28): qty 1

## 2016-01-16 MED ORDER — LEVETIRACETAM 500 MG PO TABS
1000.0000 mg | ORAL_TABLET | Freq: Two times a day (BID) | ORAL | Status: DC
  Administered 2016-01-16: 1000 mg via ORAL
  Filled 2016-01-16 (×2): qty 2

## 2016-01-16 MED ORDER — BISACODYL 10 MG RE SUPP: 10.0000 mg | Freq: Every day | RECTAL | Status: DC | PRN

## 2016-01-16 MED ORDER — ONDANSETRON HCL 4 MG PO TABS: 4.0000 mg | ORAL_TABLET | Freq: Four times a day (QID) | ORAL | Status: DC | PRN

## 2016-01-16 MED ORDER — ONDANSETRON HCL 4 MG/2ML IJ SOLN: 4.0000 mg | Freq: Four times a day (QID) | INTRAMUSCULAR | Status: DC | PRN

## 2016-01-16 MED ORDER — GLUCERNA SHAKE PO LIQD
237.0000 mL | Freq: Three times a day (TID) | ORAL | Status: DC
  Administered 2016-01-16: 237 mL via ORAL

## 2016-01-16 MED ORDER — DIGOXIN 125 MCG PO TABS
0.1250 mg | ORAL_TABLET | Freq: Every day | ORAL | Status: DC
Start: 2016-01-17 — End: 2016-01-30
  Administered 2016-01-17 – 2016-01-30 (×14): 0.125 mg via ORAL
  Filled 2016-01-16 (×14): qty 1

## 2016-01-16 MED ORDER — AMLODIPINE BESYLATE 5 MG PO TABS
5.0000 mg | ORAL_TABLET | Freq: Every day | ORAL | Status: DC
  Administered 2016-01-17 – 2016-01-30 (×14): 5 mg via ORAL
  Filled 2016-01-16 (×14): qty 1

## 2016-01-16 MED ORDER — PREDNISONE 5 MG PO TABS
5.0000 mg | ORAL_TABLET | Freq: Every day | ORAL | Status: DC
  Administered 2016-01-17 – 2016-01-30 (×14): 5 mg via ORAL
  Filled 2016-01-16 (×14): qty 1

## 2016-01-16 MED ORDER — PROCHLORPERAZINE 25 MG RE SUPP: 12.5000 mg | Freq: Four times a day (QID) | RECTAL | Status: DC | PRN

## 2016-01-16 MED ORDER — MYCOPHENOLATE MOFETIL 250 MG PO CAPS
500.0000 mg | ORAL_CAPSULE | Freq: Two times a day (BID) | ORAL | Status: DC
  Administered 2016-01-16 – 2016-01-30 (×28): 500 mg via ORAL
  Filled 2016-01-16 (×29): qty 2

## 2016-01-16 MED ORDER — POLYSACCHARIDE IRON COMPLEX 150 MG PO CAPS
150.0000 mg | ORAL_CAPSULE | Freq: Every day | ORAL | Status: DC
  Administered 2016-01-17 – 2016-01-30 (×14): 150 mg via ORAL
  Filled 2016-01-16 (×14): qty 1

## 2016-01-16 MED ORDER — CYCLOSPORINE MODIFIED (NEORAL) 25 MG PO CAPS
25.0000 mg | ORAL_CAPSULE | Freq: Two times a day (BID) | ORAL | Status: DC
  Administered 2016-01-16 – 2016-01-30 (×28): 25 mg via ORAL
  Filled 2016-01-16 (×28): qty 1

## 2016-01-16 MED ORDER — PROCHLORPERAZINE MALEATE 5 MG PO TABS
5.0000 mg | ORAL_TABLET | Freq: Four times a day (QID) | ORAL | Status: DC | PRN
Start: 2016-01-16 — End: 2016-01-30

## 2016-01-16 MED ORDER — VITAMIN D (ERGOCALCIFEROL) 1.25 MG (50000 UNIT) PO CAPS: 50000.0000 [IU] | ORAL_CAPSULE | ORAL | Status: DC

## 2016-01-16 MED ORDER — INSULIN GLARGINE 100 UNIT/ML ~~LOC~~ SOLN
13.0000 [IU] | Freq: Every day | SUBCUTANEOUS | Status: DC
  Administered 2016-01-16: 13 [IU] via SUBCUTANEOUS
  Filled 2016-01-16: qty 0.13

## 2016-01-16 MED ORDER — CHLORHEXIDINE GLUCONATE 0.12 % MT SOLN
15.0000 mL | Freq: Two times a day (BID) | OROMUCOSAL | Status: DC
  Administered 2016-01-17 – 2016-01-30 (×24): 15 mL via OROMUCOSAL
  Filled 2016-01-16 (×26): qty 15

## 2016-01-16 MED ORDER — CETYLPYRIDINIUM CHLORIDE 0.05 % MT LIQD
7.0000 mL | Freq: Two times a day (BID) | OROMUCOSAL | Status: DC
  Administered 2016-01-17 – 2016-01-30 (×26): 7 mL via OROMUCOSAL

## 2016-01-16 MED ORDER — ALUM & MAG HYDROXIDE-SIMETH 200-200-20 MG/5ML PO SUSP
30.0000 mL | ORAL | Status: DC | PRN
  Administered 2016-01-26: 30 mL via ORAL
  Filled 2016-01-16: qty 30

## 2016-01-16 MED ORDER — LEVETIRACETAM 500 MG PO TABS
1000.0000 mg | ORAL_TABLET | Freq: Two times a day (BID) | ORAL | Status: DC
  Administered 2016-01-16 – 2016-01-17 (×3): 1000 mg via ORAL
  Administered 2016-01-18: 500 mg via ORAL
  Filled 2016-01-16 (×4): qty 2

## 2016-01-16 MED ORDER — FLEET ENEMA 7-19 GM/118ML RE ENEM: 1.0000 | ENEMA | Freq: Once | RECTAL | Status: DC | PRN

## 2016-01-16 MED ORDER — ACETAMINOPHEN 325 MG PO TABS
325.0000 mg | ORAL_TABLET | ORAL | Status: DC | PRN
  Administered 2016-01-17 – 2016-01-20 (×6): 650 mg via ORAL
  Filled 2016-01-16 (×6): qty 2

## 2016-01-16 NOTE — Discharge Summary (Signed)
Physician Discharge Summary  Patient ID: [name] [name] MRN: [id number] DOB/AGE: [birth year] 70 y.o.  Admit date: 01/07/2016 Discharge date: 01/16/2016  Discharge Diagnoses Patient Active Problem List   Diagnosis Date Noted  . Protein-calorie malnutrition, severe 01/14/2016  . SAH (subarachnoid hemorrhage) (HCC)   . Benign essential HTN   . Seizures (HCC) 01/13/2016  . Anticoagulated on Coumadin   . Subdural hematoma (HCC)   . Chronic atrial fibrillation (HCC)   . IDDM (insulin dependent diabetes mellitus) (HCC)   . History of renal transplant   . PVD (peripheral vascular disease) (HCC)   . Ischemic ulcer of both feet (HCC)   . SDH (subdural hematoma) (HCC) 01/07/2016  . Type II diabetes mellitus (HCC) 11/23/2015  . Hypoglycemia 11/23/2015  . Critical lower limb ischemia 11/20/2015  . Encounter for therapeutic drug monitoring 10/06/2013  . Colon cancer screening 06/01/2012  . 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 PRIMARY HYPERPARATHYROIDISM 08/06/2010  . ANEMIA 08/06/2010  . DEPRESSION 08/06/2010  . Mitral stenosis 08/06/2010  . Atrial fibrillation (HCC) 08/06/2010  . 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 DIASTOLIC DYSFUNCTION 08/06/2010  . Cerebral artery occlusion with cerebral infarction (HCC) 08/06/2010  . PVD 08/06/2010  . RENAL FAILURE, END STAGE 08/06/2010  . PACEMAKER, PERMANENT 08/06/2010    Consultants Drs. Margarita Rana and Aldean Baker for orthopedic surgery  Dr. Altamease Oiler for neurosurgery  Dr. Cephas Darby for internal medicine  Dr. Faith Rogue for PM&R  Dr. Ritta Slot for neurology   Procedures None   HPI: Shereece fell out of bed and bumped her head on the nightstand. There was no loss of consciousness. EMS was called but the patient refused transport. She stayed in bed all day and had developed a bad headache by the evening so EMS was called again and she was transported to the ED. She underwent CT scans of the head and cervical spine and  the brain injury was identified. She was on coumadin, Plavix, and aspirin. She underwent rapid reversal of her coumadin. She was admitted to the trauma service and orthopedic surgery, neurosurgery, and internal medicine were consulted.   Hospital Course: Orthopedic surgery recommended non-operative treatment for her pelvic fractures. Because of some suggestion of extension into her acetabulum on the plain x-ray a CT scan of the pelvis was obtained that showed the fracture was limited to the ramus. Internal medicine was brought on to help manage her medical problems. Neurosurgery recommended non-operative treatment and a follow-up head CT the next day after some neurologic decline did show worsening hemorrhage. She became aphasic, first expressively then globally. Shortly thereafter she developed focal motor seizures and Keppra was started. Neurology was consulted. She was due to have some toe amputations the following week by podiatry and they seemed to be bothering her. Orthopedic surgery brought in a specialist who did not feel that was the right treatment for her but recommended no intervention this admission. Her neurologic symptoms waxed and waned despite her head CT stabilizing. She underwent several EEG's in the course of getting her seizures under control. She was evaluated by the traumatic brain injury therapy team who recommended inpatient rehabilitation. They were consulted and agreed with admission. She was discharged there in good condition.   Inpatient Medications Scheduled Meds: . amLODipine  5 mg Oral Daily  . antiseptic oral rinse  7 mL Mouth Rinse BID  . chlorhexidine  15 mL Mouth Rinse BID  . cycloSPORINE modified  25 mg Oral BID  . digoxin  0.125 mg Oral Daily  .  feeding supplement (GLUCERNA SHAKE)  237 mL Oral TID BM  . insulin aspart  0-15 Units Subcutaneous TID WC  . insulin glargine  13 Units Subcutaneous Daily  . levETIRAcetam  1,000 mg Oral BID  . metoprolol tartrate  25 mg  Oral BID  . mycophenolate  500 mg Oral BID  . pravastatin  20 mg Oral QPM  . predniSONE  5 mg Oral Daily  . valACYclovir  500 mg Oral Q M,W,F  . [START ON 01/20/2016] Vitamin D (Ergocalciferol)  50,000 Units Oral Q30 days   Continuous Infusions:  PRN Meds:.HYDROmorphone (DILAUDID) injection, hydroxypropyl methylcellulose / hypromellose, ondansetron **OR** ondansetron (ZOFRAN) IV  Home Medications   Medication List    TAKE these medications        acetaminophen 500 MG tablet  Commonly known as:  TYLENOL  Take 500 mg by mouth every 6 (six) hours as needed. For pain     amLODipine 5 MG tablet  Commonly known as:  NORVASC  Take 1 tablet (5 mg total) by mouth daily.     azelastine 0.05 % ophthalmic solution  Commonly known as:  OPTIVAR  Place 1 drop into both eyes 2 (two) times daily as needed (for dry eyes).     b complex-vitamin c-folic acid 0.8 MG Tabs tablet  Take 1 tablet by mouth daily.     CALTRATE 600+D 600-400 MG-UNIT tablet  Generic drug:  Calcium Carbonate-Vitamin D  Take 1 tablet by mouth 2 (two) times daily.     clopidogrel 75 MG tablet  Commonly known as:  PLAVIX  Take 1 tablet (75 mg total) by mouth daily.     cycloSPORINE modified 25 MG capsule  Commonly known as:  NEORAL  Take 25 mg by mouth 2 (two) times daily.     digoxin 0.125 MG tablet  Commonly known as:  LANOXIN  Take 0.125 mg by mouth daily.     ergocalciferol 50000 units capsule  Commonly known as:  VITAMIN D2  Take 50,000 Units by mouth every 30 (thirty) days. First of the month     furosemide 20 MG tablet  Commonly known as:  LASIX  TAKE 1 TABLET BY MOUTH EVERY OTHER DAY     iron polysaccharides 150 MG capsule  Commonly known as:  NIFEREX  Take 150 mg by mouth daily.     JANUVIA 50 MG tablet  Generic drug:  sitaGLIPtin  Take 1 tablet by mouth 2 (two) times daily.     LEVEMIR FLEXTOUCH 100 UNIT/ML Pen  Generic drug:  Insulin Detemir  Inject 10 Units as directed at bedtime.      lidocaine 5 % ointment  Commonly known as:  XYLOCAINE  Apply 1 application topically as needed. For foot pain     metoprolol 100 MG tablet  Commonly known as:  LOPRESSOR  TAKE 1 TABLET BY MOUTH TWICE A DAY     mycophenolate 250 MG capsule  Commonly known as:  CELLCEPT  Take 500 mg by mouth 2 (two) times daily.     ONE TOUCH ULTRA TEST test strip  Generic drug:  glucose blood  TEST BLOOD SUGAR 3 TIMES A DAY (E11.9)     ONE-A-DAY WOMENS 50 PLUS PO  Take 1 tablet by mouth daily.     pravastatin 20 MG tablet  Commonly known as:  PRAVACHOL  Take 1 tablet (20 mg total) by mouth every evening.     predniSONE 5 MG tablet  Commonly known as:  DELTASONE  Take  5 mg by mouth daily.     traMADol 50 MG tablet  Commonly known as:  ULTRAM  Take 1 tablet (50 mg total) by mouth every 8 (eight) hours as needed for moderate pain.     valACYclovir 500 MG tablet  Commonly known as:  VALTREX  Take 500 mg by mouth every Monday, Wednesday, and Friday. Only on Monday, Wednesday and Friday     warfarin 2.5 MG tablet  Commonly known as:  COUMADIN  1 Tab daily M,W,Th,F,Sun; 1/2 tab daily Tues/Sat.            Follow-up Information    Schedule an appointment as soon as possible for a visit with Sheral Apley, MD.   Specialty:  Orthopedic Surgery   Contact information:   [address] [place] [address] [phone]       Schedule an appointment as soon as possible for a visit with [facility].   Contact information:   [address]     [address] [place] [address] [phone]      Call [facility] TRAUMA [facility].   Why:  As needed   Contact information:   [address] [id number] mc [place] [address] [phone]       Signed: Freeman Caldron, PA-C Pager: [phone] General Trauma PA Pager: [phone] 01/16/2016, 2:46 PM

## 2016-01-16 NOTE — Progress Notes (Addendum)
Patient A with confusion. Bruising on bony prominence (right hip/cocyxx) and RUE, no noted open areas. BLE (toes) necrotic. Treatment order betadine once a day and dry; tender when touched. IV 24g Rt F/A (01/15/16). Incont B/B. Patient unable to answer assessment questions resulted in cognitive deficit. Writer fed patient at dinner 75%.  Administered meds in applesauce. At the time of assessment, total care unable to do ADL and toileting. Staff will continue to meet needs and maintain safety.

## 2016-01-16 NOTE — Progress Notes (Signed)
Patient ID: [name] [name], female   DOB: 06-Jul-1946, 70 y.o.   MRN: [id number] Medicine attending: I examined this patient this morning with resident physician Dr. Deneise Lever and I concur with his evaluation and management plan. She is medically stable for transfer to rehabilitation service. Blood pressure and sugars are controlled. Atrial fibrillation, ventricular rate controlled and stable. From the hematology standpoint, there is no reason why warfarin cannot be resumed with careful monitoring of her PT/INR if she is in an otherwise stable and supervised situation. She had a single fall at home. She has no motor deficits from the subdural hematoma. Her stroke risk with chronic atrial fibrillation and severe mitral stenosis,CHADS2VASC2 score 6,  Outweighs  the risk of recurrent cerebral hemorrhage.     Cephas Darby, MD, FACP  Hematology-Oncology/Internal Medicine

## 2016-01-16 NOTE — Progress Notes (Signed)
Rehab admissions - I spoke with daughter by phone.  Dtr in agreement to inpatient rehab admission.  Dtr plans for patient's ex-husband to stay with patient after discharge or daughter plans to get assistance for patient.  Daughter concerned about the need for toe amputations.  I spoke with trauma PA and patient is medically ready for rehab today.  Bed available and will admit to acute inpatient rehab today.  Call me for questions.  #[phone]

## 2016-01-16 NOTE — Progress Notes (Signed)
Speech Language Pathology Treatment: Dysphagia;Cognitive-Linquistic  Patient Details Name: [name] [name] MRN: [id number] DOB: 06/22/1946 Today's Date: 01/16/2016 Time: 4888-9169 SLP Time Calculation (min) (ACUTE ONLY): 23 min  Assessment / Plan / Recommendation Clinical Impression  Pt seen with am meal very late. Pt was able to self feed without evidence of aspiration or oral dysphagia. Pt will benefit from upgrade to diet texture to regular thin, but given impending evaluation on CIR, will defer diet upgrade to next SLP. Also provided therapeutic interventions to facilitate function communication. During functional task, pt was able to follow one step commands and demonstrated basic functional problem solving. When asked Yes/No question in in functional task pt observed to be perseverative and inaccurate. Branched to naming of familiar objects and was pt able to improve from 0/3 accuracy to 2/3 accuracy in repeated trials with visual and phonemic cues. Pt also struggled with Y/N accuracy with basic biographical questions; utilized visual and verbal cues to reduce perseveration in responding to correct name over 10 trials, success in last three trials only. Pt responds well to verbal reinforcement, but can get easily discouraged. Will benefit from CIR.    HPI HPI: 70 y.o. female admitted after falling out of bed and sustaining SDH/SAH, left inf/sup rami fractures.  CCT small left convexity SDH, left SAH; aphasia. 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       SLP Plan  Continue with current plan of care     Recommendations  Diet recommendations: Regular;Thin liquid Liquids provided via: Cup;Straw Medication Administration: Whole meds with liquid Supervision: Patient able to self feed Postural Changes and/or Swallow Maneuvers: Out of bed for meals              Plan: Continue with current plan of care     GO                Harry Shuck, Riley Nearing 01/16/2016, 2:15 PM

## 2016-01-16 NOTE — Progress Notes (Signed)
Patient ID: [name] [name], female   DOB: [birth year], 70 y.o.   MRN: [id number]   LOS: 9 days   Subjective: Aphasia continues.   Objective: Vital signs in last 24 hours: Temp:  [97.7 F (36.5 C)-98.5 F (36.9 C)] 98.5 F (36.9 C) (05/11 0602) Pulse Rate:  [48-96] 96 (05/11 0602) Resp:  [16-22] 18 (05/11 0602) BP: (121-183)/(75-107) 143/91 mmHg (05/11 0602) SpO2:  [97 %-100 %] 100 % (05/11 0602) Last BM Date: 01/09/16   Laboratory Results CBG (last 3)   Recent Labs  01/15/16 1625 01/15/16 2115 01/16/16 0641  GLUCAP 184* 109* 71    Physical Exam General appearance: alert and no distress Resp: clear to auscultation bilaterally Cardio: regular rate and rhythm GI: normal findings: bowel sounds normal and soft, non-tender   Assessment/Plan: Fall from bed TBI w/SDH on coumadin s/p rapid reversal -- TBI team. Continuous EEG showed no ongoing seizure activity. Left pubic rami fxs -- WBAT per Dr. Eulah Pont Multiple medical problems -- per IM Left foot gangrenous toes -- Appreciate ortho consult, they recommend no acute intervention FEN -- No issues VTE -- SCD's Dispo -- CIR when bed available    Freeman Caldron, PA-C Pager: [phone] General Trauma PA Pager: [phone]  01/16/2016

## 2016-01-16 NOTE — PMR Pre-admission (Signed)
PMR Admission Coordinator Pre-Admission Assessment  Patient: [name] [name] is an 70 y.o., female MRN: [id number] DOB: 16-May-1946 Height: 5\' 2"  (157.5 cm) Weight: 36.2 kg (79 lb 12.9 oz)              Insurance Information HMO:      PPO:       PCP:       IPA:       80/20:       OTHER:   PRIMARY: Medicare A/B      Policy#: [id number]      Subscriber: Charleston Ropes CM Name:        Phone#:       Fax#:   Pre-Cert#:        Employer: Retired Benefits:  Phone #:       Name: Checked in [place]. Date: 09/07/90     Deduct: $1316      Out of Pocket Max: None      Life Max: unlimited CIR: 100%      SNF: 100 days Outpatient: 80%     Co-Pay: 20% Home Health: 100%      Co-Pay: none DME: 80%     Co-Pay: 20% Providers: patient's choice  SECONDARY: BCBS supplement      Policy#: [id number]      Subscriber: Charleston Ropes CM Name:        Phone#:       Fax#:   Pre-Cert#:        Employer:  Retired Benefits:  Phone #: [phone]     Name:   Eff. Date:       Deduct:        Out of Pocket Max:        Life Max:   CIR:        SNF:   Outpatient:       Co-Pay:   Home Health:        Co-Pay:   DME:       Co-Pay:    Emergency Contact Information Contact Information    Name Relation Home Work Mobile   Cuthrell,Tammy Daughter   [phone]     Current Medical History  Patient Admitting Diagnosis:TBI/pelvic injuries after fall from bed. Hx of MS    History of Present Illness: A 70 y.o. female with history fo HTN, CVA, severe mitral stenosis, and symptomatic AS, T2DM, CKD s/p renal transplant, PAF-on coumadin, gait disorder, PAD with severe claudication LLE and bilateral foot ulcers (with plans for partial foot amputation next week) who was admitted on 01/07/16 due to fall out of bed--she struck her head and EMS called but patient refused to go to the hospital till later that evening due to inability to get out of bed. She was noted to have multiple contusion on right side and Minimally  displaced left superior and inferior pubic rami with possible extension into left acetabulum. 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 She was evaluated by Dr. Jordan Likes who recommended reversal of coumadin and serial CT for monitoring. Dr. Renaye Rakers recommended WBAT on LLE. Follow up CCT showed ncrease in bleed and edema and she did have worsening of expressive aphasia as well focal motor seizure activity.   She was placed on keppra with improvement in symptoms. Serial CCT monitored--last of 05/09 showed mild increase in edema left frontal lobe and associated SAH/SDH without significant  improvement. Patient globally aphasic and 24 hour EEG 5/10 showed slowing across right frontoparietal regions question toxic metabolic encephalopathy v/s neuronal dysfunction or cortical irritability. Dr. Jordan Likes recommended holding coumadin indefinitely. Plans for amputations deferred per IM. She has had some improvement in ability to answer Y/N questions, has had issue with tachybrady issues --HR 40-90 and pain BLE with weight bearing. CIR recommended by rehab team and patient admitted today.   Note:  Patient was scheduled to have a partial left foot amputation at [facility], but it was cancelled due to accident.  Dr. Lajoyce Corners feels patient will need a BKA due to poor blood supply.  Trauma recommending follow-up with pending amputation as an outpatient after rehab discharge.     Past Medical History  Past Medical History  Diagnosis Date  . Hypertension   . Stroke [facility]) December 2011    left MCA distribution  . Permanent atrial fibrillation (HCC)     coumadin clinic at [facility]. Has previously required Lovenox->Coumadin bridge.  . Severe mitral stenosis by prior echocardiogram     last echo 07/2012  . Aortic stenosis   . Symptomatic bradycardia     s/p pacer  . Depression   . HSV  (herpes simplex virus) infection   . Anemia   . Hyperparathyroidism, primary (HCC)     s/p parathyroidectomy  . PAD (peripheral artery disease) (HCC)     a. s/p RLE angioplasty with [name]. b. (by [name])  L popliteral artery orbital atherectomy and balloon angioplasty & L SFA orbital atherectomy and balloon angioplasty 11/30/12.  [name] PVD (peripheral vascular disease) (HCC)   . Presence of permanent cardiac pacemaker   . High cholesterol   . CHF (congestive heart failure) (HCC)   . TIA (transient ischemic attack) 08/2009    [name] 09/05/2009  . Atrial thrombus (HCC)     appendage/notes 09/05/2009  . Type II diabetes mellitus (HCC)   . History of blood transfusion     related to "kidney transplant"  . Chronic kidney disease     s/p renal transplant;  follows with [name]  . GERD (gastroesophageal reflux disease)     hx  . History of stomach ulcers   . Diabetic foot ulcer (HCC)     "left is worse than right" (11/20/2015)    Family History  family history includes Kidney disease in her brother and sister. There is no history of Heart attack or Stroke. She was adopted.  Prior Rehab/Hospitalizations: No previous rehab admissions.  Has the patient had major surgery during 100 days prior to admission? Yes.  Had a stent placed in her leg around 11/23/15  Current Medications   Current facility-administered medications:  .  amLODipine (NORVASC) tablet 5 mg, 5 mg, Oral, Daily, Jimmye Norman, MD, 5 mg at 01/16/16 0956 .  antiseptic oral rinse (CPC / CETYLPYRIDINIUM CHLORIDE 0.05%) solution 7 mL, 7 mL, Mouth Rinse, BID, Jimmye Norman, MD, 7 mL at 01/16/16 0957 .  chlorhexidine (PERIDEX) 0.12 % solution 15 mL, 15 mL, Mouth Rinse, BID, Jimmye Norman, MD, 15 mL at 01/16/16 0957 .  cycloSPORINE modified (NEORAL) capsule 25 mg, 25 mg, Oral, BID, Harriette Bouillon, MD, 25 mg at 01/16/16 0957 .  digoxin (LANOXIN) tablet 0.125 mg, 0.125 mg, Oral, Daily, Jimmye Norman, MD, 0.125 mg at 01/16/16  0956 .  feeding supplement (GLUCERNA SHAKE) (GLUCERNA SHAKE) liquid 237 mL, 237 mL, Oral, TID BM, Heather C Pitts, RD, 237 mL at 01/16/16 1020 .  HYDROmorphone (DILAUDID)  injection 0.5 mg, 0.5 mg, Intravenous, Q2H PRN, Harriette Bouillon, MD, 0.5 mg at 01/13/16 2341 .  hydroxypropyl methylcellulose / hypromellose (ISOPTO TEARS / GONIOVISC) 2.5 % ophthalmic solution 1 drop, 1 drop, Both Eyes, QID PRN, Harriette Bouillon, MD .  insulin aspart (novoLOG) injection 0-15 Units, 0-15 Units, Subcutaneous, TID WC, Emelia Loron, MD, 3 Units at 01/15/16 1731 .  insulin glargine (LANTUS) injection 13 Units, 13 Units, Subcutaneous, Daily, Freeman Caldron, PA-C, 13 Units at 01/16/16 [phone] .  levETIRAcetam (KEPPRA) tablet 1,000 mg, 1,000 mg, Oral, BID, Freeman Caldron, PA-C, 1,000 mg at 01/16/16 1220 .  metoprolol tartrate (LOPRESSOR) tablet 25 mg, 25 mg, Oral, BID, Freeman Caldron, PA-C, 25 mg at 01/16/16 0957 .  mycophenolate (CELLCEPT) capsule 500 mg, 500 mg, Oral, BID, Harriette Bouillon, MD, 500 mg at 01/16/16 0957 .  ondansetron (ZOFRAN) tablet 4 mg, 4 mg, Oral, Q6H PRN **OR** ondansetron (ZOFRAN) injection 4 mg, 4 mg, Intravenous, Q6H PRN, Harriette Bouillon, MD .  pravastatin (PRAVACHOL) tablet 20 mg, 20 mg, Oral, QPM, Harriette Bouillon, MD, 20 mg at 01/15/16 1731 .  predniSONE (DELTASONE) tablet 5 mg, 5 mg, Oral, Daily, Jimmye Norman, MD, 5 mg at 01/16/16 0958 .  valACYclovir (VALTREX) tablet 500 mg, 500 mg, Oral, Q M,W,F, Harriette Bouillon, MD, 500 mg at 01/15/16 1024 .  [START ON 01/20/2016] Vitamin D (Ergocalciferol) (DRISDOL) capsule 50,000 Units, 50,000 Units, Oral, Q30 days, Harriette Bouillon, MD  Patients Current Diet: DIET DYS 3 Room service appropriate?: Yes; Fluid consistency:: Thin  Precautions / Restrictions Precautions Precautions: Fall Precaution Comments: watch HR Restrictions Weight Bearing Restrictions: Yes LLE Weight Bearing: Weight bearing as tolerated   Has the patient had 2 or more falls or a  fall with injury in the past year?Yes.  Daughter reports 6 falls with injury including severe bruising and the latest injuries related to current hospital admission.  Prior Activity Level Community (5-7x/wk): Went out daily, was driving.  Home Assistive Devices / Equipment Home Assistive Devices/Equipment: Dan Humphreys (specify type) Home Equipment: Walker - 2 wheels, Walker - 4 wheels, Cane - quad  Prior Device Use: Indicate devices/aids used by the patient prior to current illness, exacerbation or injury? Walker  Prior Functional Level Prior Function Level of Independence: Independent with assistive device(s) Comments: used rollator mainly for gait (from previous admission note)  Self Care: Did the patient need help bathing, dressing, using the toilet or eating?  Independent  Indoor Mobility: Did the patient need assistance with walking from room to room (with or without device)? Independent  Stairs: Did the patient need assistance with internal or external stairs (with or without device)? Independent  Functional Cognition: Did the patient need help planning regular tasks such as shopping or remembering to take medications? Independent  Current Functional Level Cognition  Arousal/Alertness: Awake/alert Overall Cognitive Status: Difficult to assess Difficult to assess due to: Impaired communication (aphasia) Current Attention Level: Focused (progressing to sustained) Orientation Level: Oriented to person, Disoriented to time, Disoriented to situation, Oriented to place Following Commands: Follows one step commands with increased time General Comments: Able to answer some questions appropriately and state a few words, "food,"  Perseverates on yes. "the foot is in the chair" Attention: Focused, Sustained Focused Attention: Appears intact Sustained Attention: Impaired Sustained Attention Impairment: Functional basic Memory:  (unable to assess, patient non-verbal) Awareness:  Impaired Awareness Impairment: Intellectual impairment Problem Solving: Impaired Problem Solving Impairment: Functional basic Executive Function: Self Monitoring Self Monitoring: Impaired Self Monitoring Impairment: Functional basic Behaviors: Impulsive  Extremity Assessment (includes Sensation/Coordination)  Upper Extremity Assessment: Generalized weakness  Lower Extremity Assessment: Defer to PT evaluation RLE Deficits / Details: Pt was able to move bil legs to EOB and go back to supine in bed on her own power.  She is at least 3-/5 per functional assessment.  Pt with wounds on bil feet indicating ischemia.  Pt seems more painful on the left leg which would be more consistant with pubic rami fractures.   LLE Deficits / Details: Pt was able to move bil legs to EOB and go back to supine in bed on her own power.  She is at least 3-/5 per functional assessment.  Pt with wounds on bil feet indicating ischemia.  Pt seems more painful on the left leg which would be more consistant with pubic rami fractures.      ADLs  Overall ADL's : Needs assistance/impaired Eating/Feeding: Set up, Sitting Grooming: Wash/dry hands, Wash/dry face, Oral care, Brushing hair, Set up, Sitting Upper Body Bathing: Supervision/ safety, Set up, Sitting Lower Body Bathing: Moderate assistance, Bed level Upper Body Dressing : Minimal assistance Lower Body Dressing: Moderate assistance, Bed level Lower Body Dressing Details (indicate cue type and reason): Pt able to don bil. socks in long sitting  Toilet Transfer: Total assistance Toilet Transfer Details (indicate cue type and reason): Pt unable to attempt due to increased HR  Toileting- Clothing Manipulation and Hygiene: Total assistance, Bed level General ADL Comments: Increased HR limited activity     Mobility  Overal bed mobility: Needs Assistance Bed Mobility: Supine to Sit Rolling: Min assist Supine to sit: HOB elevated, Mod assist Sit to supine: Min  guard General bed mobility comments: Able to initiate bringing BLEs to EOB, requires multimodal cues to scoot bottom to EOB; assist to elevate trunk. + dizziness noted with sway.    Transfers  Overall transfer level: Needs assistance Equipment used: Rolling walker (2 wheeled) Transfers: Sit to/from Stand, [employer] Transfers Sit to Stand: Mod assist Stand pivot transfers: Mod assist General transfer comment: Mod A to boost from EOB with cues for hand placement/technique. Stood Clinical research associate. SPT to chair with Mod A due to posterior bias. Wincing in pain with WB. HR variable 38-92 bpm.    Ambulation / Gait / Stairs / Wheelchair Mobility       Posture / Balance Dynamic Sitting Balance Sitting balance - Comments: Able to sit EOB with BUe support.  Balance Overall balance assessment: Needs assistance Sitting-balance support: Feet supported, No upper extremity supported Sitting balance-Leahy Scale: Good Sitting balance - Comments: Able to sit EOB with BUe support.  Standing balance support: During functional activity, Bilateral upper extremity supported Standing balance-Leahy Scale: Poor Standing balance comment: Reliant on BUEs for support with RW in standing and Min A for pericare. Able to stand ~1 minute.    Special needs/care consideration BiPAP/CPAP No CPM No Continuous Drip IV No Dialysis No         Life Vest No Oxygen No Special Bed No Trach Size No Wound Vac (area) No      Skin Toes are ischemic and amputation will be needed at some point after rehab is completed.                            Bowel mgmt: Last BM documented 01/09/16 Bladder mgmt: voiding with incontinence Diabetic mgmt Yes, on insulin at home    Previous Home Environment Living Arrangements: Alone Available Help at Discharge: Family,  Available PRN/intermittently Type of Home: House Home Layout: One level Home Access: Level entry Bathroom Toilet: Standard Bathroom Accessibility: No Home Care Services:  No Additional Comments: Pt unable to provide info.  PLOF and home living populated from previous admission   Discharge Living Setting Plans for Discharge Living Setting: Patient's home, Alone, House (Lives alone in a condo.) Type of Home at Discharge: House (Condominium) Discharge Home Layout: One level Discharge Home Access: Level entry Does the patient have any problems obtaining your medications?: No  Social/Family/Support Systems Patient Roles: Parent, Other (Comment) (Has ex-husband, and 2 daughters.) Contact Information: Tammy Cuthrell - daughter Anticipated Caregiver: ex-husband, daughters, hire help if needed Anticipated Caregiver's Contact Information: [name] - daughter - [phone] Ability/Limitations of Caregiver: One daughter lives in [place].  Another daughter works locally.  Ex-husband Caregiver Availability: 24/7 (Daughter aware patient will likely need 24/7 supervision) Discharge Plan Discussed with Primary Caregiver: Yes Is Caregiver In Agreement with Plan?: Yes Does Caregiver/Family have Issues with Lodging/Transportation while Pt is in Rehab?: No  Goals/Additional Needs Patient/Family Goal for Rehab: PT/OT/ST supervision goals Expected length of stay: 9-14 days Cultural Considerations: None Dietary Needs: Dys 3, thin liquids Equipment Needs: TBD Pt/Family Agrees to Admission and willing to participate: Yes Program Orientation Provided & Reviewed with Pt/Caregiver Including Roles  & Responsibilities: Yes  Decrease burden of Care through IP rehab admission: N/A  Possible need for SNF placement upon discharge: Yes, if daughter unable to work out help after rehab discharge.  Patient Condition: This patient's medical and functional status has changed since the consult dated: 01/10/16 in which the Rehabilitation Physician determined and documented that the patient's condition is appropriate for intensive rehabilitative care in an inpatient rehabilitation facility. See  "History of Present Illness" (above) for medical update. Functional changes are: Currently requiring mod assist for stand pivot transfers. Patient's medical and functional status update has been discussed with the Rehabilitation physician and patient remains appropriate for inpatient rehabilitation. Will admit to inpatient rehab today.  Preadmission Screen Completed By:  Trish Mage, 01/16/2016 2:06 PM ______________________________________________________________________   Discussed status with Dr. Wynn Banker on 01/16/16 at 1403 and received telephone approval for admission today.  Admission Coordinator:  Trish Mage, time1406/Date05/11/17

## 2016-01-16 NOTE — Care Management Note (Signed)
Case Management Note  Patient Details  Name: EWELINA ARCIERO MRN: [id number] Date of Birth: [birth year]  Subjective/Objective:   Pt medically stable for dc to IP Rehab today.                   Action/Plan: Plan dc to [facility] IP rehab later today.    Expected Discharge Date:      01/16/16            Expected Discharge Plan:  IP Rehab Facility  In-House Referral:     Discharge planning Services  CM Consult  Post Acute Care Choice:    Choice offered to:     DME Arranged:    DME Agency:     HH Arranged:    HH Agency:     Status of Service:  Completed, signed off  Medicare Important Message Given:  Yes Date Medicare IM Given:    Medicare IM give by:    Date Additional Medicare IM Given:    Additional Medicare Important Message give by:     If discussed at Long Length of Stay Meetings, dates discussed:    Additional Comments:  Quintella Baton, RN, BSN  Trauma/Neuro ICU Case Manager [phone]

## 2016-01-16 NOTE — Progress Notes (Signed)
Internal Medicine Daily Consult Note   Date: 01/16/2016               Patient Name:  JAREN KEARN MRN: [id number]  DOB: 1945/09/29 Age / Sex: 69 y.o., female   PCP: Marva Panda, NP         Requesting Physician: Dr. Minerva Fester Md, MD    Consulting Reason:  Management of diabetes and hypertension     Chief Complaint: Subdural hematoma, pubic ramus fracture  Subjective:   Pt's mental status continues to show improvement and she was eating breakfast. No acute events overnight  Meds: Current Facility-Administered Medications  Medication Dose Route Frequency Provider Last Rate Last Dose  . amLODipine (NORVASC) tablet 5 mg  5 mg Oral Daily Jimmye Norman, MD   5 mg at 01/16/16 0956  . antiseptic oral rinse (CPC / CETYLPYRIDINIUM CHLORIDE 0.05%) solution 7 mL  7 mL Mouth Rinse BID Jimmye Norman, MD   7 mL at 01/16/16 0957  . chlorhexidine (PERIDEX) 0.12 % solution 15 mL  15 mL Mouth Rinse BID Jimmye Norman, MD   15 mL at 01/16/16 0957  . cycloSPORINE modified (NEORAL) capsule 25 mg  25 mg Oral BID Harriette Bouillon, MD   25 mg at 01/16/16 0957  . digoxin (LANOXIN) tablet 0.125 mg  0.125 mg Oral Daily Jimmye Norman, MD   0.125 mg at 01/16/16 0956  . feeding supplement (GLUCERNA SHAKE) (GLUCERNA SHAKE) liquid 237 mL  237 mL Oral TID BM Arlyss Gandy, RD   237 mL at 01/16/16 1020  . HYDROmorphone (DILAUDID) injection 0.5 mg  0.5 mg Intravenous Q2H PRN Harriette Bouillon, MD   0.5 mg at 01/13/16 2341  . hydroxypropyl methylcellulose / hypromellose (ISOPTO TEARS / GONIOVISC) 2.5 % ophthalmic solution 1 drop  1 drop Both Eyes QID PRN Harriette Bouillon, MD      . insulin aspart (novoLOG) injection 0-15 Units  0-15 Units Subcutaneous TID WC Emelia Loron, MD   3 Units at 01/15/16 1731  . insulin glargine (LANTUS) injection 13 Units  13 Units Subcutaneous Daily Freeman Caldron, PA-C   13 Units at 01/16/16 0956  . levETIRAcetam (KEPPRA) tablet 1,000 mg  1,000 mg Oral BID Freeman Caldron, PA-C   1,000 mg at  01/16/16 1220  . metoprolol tartrate (LOPRESSOR) tablet 25 mg  25 mg Oral BID Freeman Caldron, PA-C   25 mg at 01/16/16 0957  . mycophenolate (CELLCEPT) capsule 500 mg  500 mg Oral BID Harriette Bouillon, MD   500 mg at 01/16/16 0957  . ondansetron (ZOFRAN) tablet 4 mg  4 mg Oral Q6H PRN Harriette Bouillon, MD       Or  . ondansetron [facility]) injection 4 mg  4 mg Intravenous Q6H PRN Harriette Bouillon, MD      . pravastatin (PRAVACHOL) tablet 20 mg  20 mg Oral QPM Harriette Bouillon, MD   20 mg at 01/15/16 1731  . predniSONE (DELTASONE) tablet 5 mg  5 mg Oral Daily Jimmye Norman, MD   5 mg at 01/16/16 0958  . valACYclovir (VALTREX) tablet 500 mg  500 mg Oral Q M,W,F Harriette Bouillon, MD   500 mg at 01/15/16 1024  . [START ON 01/20/2016] Vitamin D (Ergocalciferol) (DRISDOL) capsule 50,000 Units  50,000 Units Oral Q30 days Harriette Bouillon, MD        Allergies: Allergies as of 01/07/2016 - Review Complete 01/07/2016  Allergen Reaction Noted  . Bactrim [sulfamethoxazole-trimethoprim] Itching 05/17/2012  . Aspirin Nausea Only 10/29/2015  .  Shellfish allergy Hives 11/20/2015  . Codeine Hives and Nausea Only   . Latex Rash 06/19/2013  . Penicillins Nausea And Vomiting and Rash 01/06/2012  . Tape Itching and Rash 11/19/2015   Past Medical History  Diagnosis Date  . Hypertension   . Stroke [facility]) December 2011    left MCA distribution  . Permanent atrial fibrillation (HCC)     coumadin clinic at [facility]. Has previously required Lovenox->Coumadin bridge.  . Severe mitral stenosis by prior echocardiogram     last echo 07/2012  . Aortic stenosis   . Symptomatic bradycardia     s/p pacer  . Depression   . HSV (herpes simplex virus) infection   . Anemia   . Hyperparathyroidism, primary (HCC)     s/p parathyroidectomy  . PAD (peripheral artery disease) (HCC)     a. s/p RLE angioplasty with [name]. b. (by [name])  L popliteral artery orbital atherectomy and balloon angioplasty & L SFA orbital  atherectomy and balloon angioplasty 11/30/12.  [name] PVD (peripheral vascular disease) (HCC)   . Presence of permanent cardiac pacemaker   . High cholesterol   . CHF (congestive heart failure) (HCC)   . TIA (transient ischemic attack) 08/2009    [name] 09/05/2009  . Atrial thrombus (HCC)     appendage/notes 09/05/2009  . Type II diabetes mellitus (HCC)   . History of blood transfusion     related to "kidney transplant"  . Chronic kidney disease     s/p renal transplant;  follows with [name]  . GERD (gastroesophageal reflux disease)     hx  . History of stomach ulcers   . Diabetic foot ulcer (HCC)     "left is worse than right" (11/20/2015)   Past Surgical History  Procedure Laterality Date  . Insert / replace / remove pacemaker    . Kidney transplant  December 1999  . Abdominal angiogram  11/30/2012    ABDOMINAL AORTIC ANGIOGRAM   DR ARDIA  . Dialysis fistula creation    . Abdominal aortagram N/A 11/30/2012    Procedure: ABDOMINAL [name];  Surgeon: [name], MD;  Location: [facility] CATH LAB;  Service: Cardiovascular;  Laterality: N/A;  . Peripheral vascular catheterization N/A 11/20/2015    Procedure: Abdominal Aortogram;  Surgeon: [name], MD;  Location: [facility] INVASIVE CV LAB;  Service: Cardiovascular;  Laterality: N/A;  . Peripheral vascular catheterization Bilateral 11/20/2015    Procedure: Lower Extremity Angiography;  Surgeon: [name], MD;  Location: [facility] INVASIVE CV LAB;  Service: Cardiovascular;  Laterality: Bilateral;  . Peripheral vascular catheterization  11/20/2015    Procedure: Peripheral Vascular Balloon Angioplasty;  Surgeon: [name], MD;  Location: [facility] INVASIVE CV LAB;  Service: Cardiovascular;;  . Tonsillectomy    . Appendectomy    . Hernia repair    . Umbilical hernia repair     Family History  Problem Relation Age of Onset  . Adopted: Yes  . Kidney disease Brother     died from  . Kidney disease Sister     died from  . Heart  attack Neg Hx   . Stroke Neg Hx    Social History   Social History  . Marital Status: Legally Separated    Spouse Name: N/A  . Number of Children: N/A  . Years of Education: N/A   Occupational History  . Not on file.   Social History Main Topics  . Smoking status: Former Smoker -- 0.25  packs/day for 10 years    Types: Cigarettes  . Smokeless tobacco: Never Used     Comment: "quit smoking cigarettes in the 1990s"  . Alcohol Use: No  . Drug Use: No  . Sexual Activity: No   Other Topics Concern  . Not on file   Social History Narrative    Physical Exam: Blood pressure 129/74, pulse 62, temperature 97.9 F (36.6 C), temperature source Oral, resp. rate 20, height 5\' 2"  (1.575 m), weight 79 lb 12.9 oz (36.2 kg), SpO2 100 %.  General: Vital signs reviewed.speech more fluent HEENT:  PERRLA Cardiovascular: 2/6 systolic murmur, regular rate Pulmonary/Chest: CTAB Abdominal: Soft, non-tender, non-distended, BS + Extremities: both feet has casts, toes are ischemic  Neuro: Alert and oriented to name, location.   Lab results: Basic Metabolic Panel:  Recent Labs  16/10/96 0639 01/15/16 0727  NA 135 138  K 4.2 3.5  CL 98* 96*  CO2 19* 27  GLUCOSE 319* 230*  BUN 20 14  CREATININE 0.79 0.74  CALCIUM 7.8* 8.1*   Liver Function Tests: No results for input(s): AST, ALT, ALKPHOS, BILITOT, PROT, ALBUMIN in the last 72 hours. No results for input(s): LIPASE, AMYLASE in the last 72 hours. No results for input(s): AMMONIA in the last 72 hours. CBC: No results for input(s): WBC, NEUTROABS, HGB, HCT, MCV, PLT in the last 72 hours. Cardiac Enzymes: No results for input(s): CKTOTAL, CKMB, CKMBINDEX, TROPONINI in the last 72 hours. BNP: No results for input(s): PROBNP in the last 72 hours. D-Dimer: No results for input(s): DDIMER in the last 72 hours. CBG:  Recent Labs  01/15/16 0555 01/15/16 1154 01/15/16 1625 01/15/16 2115 01/16/16 0641 01/16/16 1129  GLUCAP 258*  189* 184* 109* 71 99   Hemoglobin A1C: No results for input(s): HGBA1C in the last 72 hours. Fasting Lipid Panel: No results for input(s): CHOL, HDL, LDLCALC, TRIG, CHOLHDL, LDLDIRECT in the last 72 hours. Thyroid Function Tests: No results for input(s): TSH, T4TOTAL, FREET4, T3FREE, THYROIDAB in the last 72 hours. Anemia Panel: No results for input(s): VITAMINB12, FOLATE, FERRITIN, TIBC, IRON, RETICCTPCT in the last 72 hours. Coagulation: No results for input(s): LABPROT, INR in the last 72 hours. Urine Drug Screen: Drugs of Abuse  No results found for: LABOPIA, COCAINSCRNUR, LABBENZ, AMPHETMU, THCU, LABBARB  Alcohol Level: No results for input(s): ETH in the last 72 hours. Urinalysis: No results for input(s): COLORURINE, LABSPEC, PHURINE, GLUCOSEU, HGBUR, BILIRUBINUR, KETONESUR, PROTEINUR, UROBILINOGEN, NITRITE, LEUKOCYTESUR in the last 72 hours.  Invalid input(s): APPERANCEUR   Imaging results:  No results found.  Assessment, Plan, & Recommendations by Problem:   Acute subdural hematoma in a patient with chronic Coumadin use- supratherapeutic with INR around 4.65.- INR rapidly reversed L:  She continues to show improvement in her speech and had fluent speech today.    -neurosurgery and trauma and neurology following -stopped decadron  -Keppra 1 mg BID   Essential hypertension- blood pressure stable. Pt has not been receiving PO antiHTNs.  -stopped IV labetalol, and continue PO metoprolol 25 mg bid  T2DM: CBGs have been much improved after stopping the decadron and continuing the lantus.  -Lantus 9 units -SSI- moderate  Atrial fibrillation likely physiologic : Supertherapeutic INR at presentation. Anticoagulation reversal and discontinuation at this time. She does have a history of prior CVA and left atrial appendage thrombus. Per neurosurgery, coumadin will be held indefinitely, but coumadin can be restarted in patientss 10 days after the hematoma.   -digoxin 0.125 mg  daily  -  holding coumadin -will contact pcp about discussing coumadin use   HF with severe mitral stenosis- per last echo- EF 60-65%  AKI on Chronic Kidney Disease ;likely stage 1- Patient with baseline creatinine around 1.1, which went up to 1.4 this admission, now trended down to 0.8  Disposition: PT recommending CIR.    Patient will continue Lantus 9 units- can be titrated upwards if she is eating more. Her HTN is also better controlled after re-starting metoprolol and amlodipine   THANK YOU FOR THE CONSULT. WE WILL SIGN OFF     The patient does have a current PCP Marva Panda, NP) and needs a hospital follow-up appointment after discharge.  Signed: Deneise Lever, MD 01/16/2016, 2:53 PM

## 2016-01-16 NOTE — H&P (Signed)
Physical Medicine and Rehabilitation Admission H&P   Chief Complaint  Patient presents with  . TBI and left pelvic fracture    HPI: [name] [name] is a 70 y.o. female with history fo HTN, CVA, severe mitral stenosis, and symptomatic AS, T2DM, CKD s/p renal transplant, PAF-on coumadin, gait disorder, PAD with severe claudication LLE and bilateral foot ulcers (with plans for partial foot amputation next week) who was admitted on 01/07/16 due to fall out of bed--she struck her head and EMS called but patient refused to go to the hospital till later that evening due to inability to get out of bed. She was noted to have multiple contusion on right side and Minimally displaced left superior and inferior pubic rami with possible extension into left acetabulum. 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 She was evaluated by Dr. Annette Stable who recommended reversal of coumadin and serial CT for monitoring. Dr. Fredonia Highland recommended WBAT on LLE. Follow up CCT showed ncrease in bleed and edema and she did have worsening of expressive aphasia as well focal motor seizure activity.   She was placed on keppra with improvement in symptoms. Serial CCT monitored--last of 05/09 showed mild increase in edema left frontal lobe and associated SAH/SDH without significant improvement. Patient globally aphasic and 24 hour EEG 5/10 showed slowing across right frontoparietal regions question toxic metabolic encephalopathy v/s neuronal dysfunction or cortical irritability. Dr. Annette Stable recommended holding coumadin indefinitely. Plans for amputations deferred per IM. She has had some improvement in ability to answer Y/N questions, has had issue with tachybrady issues --HR 40-90 and pain BLE with weight bearing. CIR recommended by rehab team and patient admitted today.   Patient is globally aphasic Review of  Systems  Unable to perform ROS: language      Past Medical History  Diagnosis Date  . Hypertension   . Stroke [facility]) December 2011    left MCA distribution  . Permanent atrial fibrillation ([place])     coumadin clinic at [facility]. Has previously required Lovenox->Coumadin bridge.  . Severe mitral stenosis by prior echocardiogram     last echo 07/2012  . Aortic stenosis   . Symptomatic bradycardia     s/p pacer  . Depression   . HSV (herpes simplex virus) infection   . Anemia   . Hyperparathyroidism, primary ([place])     s/p parathyroidectomy  . PAD (peripheral artery disease) ([facility])     a. s/p RLE angioplasty with Dr. Kathlene Cote. b. (by Dr. Fletcher Anon) L popliteral artery orbital atherectomy and balloon angioplasty & L SFA orbital atherectomy and balloon angioplasty 11/30/12.  [name] PVD (peripheral vascular disease) ([place])   . Presence of permanent cardiac pacemaker   . High cholesterol   . CHF (congestive heart failure) ([place])   . TIA (transient ischemic attack) 08/2009    Archie Endo 09/05/2009  . Atrial thrombus ([place])     appendage/notes 09/05/2009  . Type II diabetes mellitus ([place])   . History of blood transfusion     related to "kidney transplant"  . Chronic kidney disease     s/p renal transplant; follows with Dr. Edrick Oh  . GERD (gastroesophageal reflux disease)     hx  . History of stomach ulcers   . Diabetic foot ulcer ([place])     "left is worse than right" (11/20/2015)    Past Surgical History  Procedure Laterality  Date  . Insert / replace / remove pacemaker    . Kidney transplant  December 1999  . Abdominal angiogram  11/30/2012    ABDOMINAL AORTIC ANGIOGRAM DR ARDIA  . Dialysis fistula creation    . Abdominal aortagram N/A 11/30/2012    Procedure: ABDOMINAL Maxcine Ham; Surgeon: Wellington Hampshire, MD; Location: [facility] CATH LAB;  Service: Cardiovascular; Laterality: N/A;  . Peripheral vascular catheterization N/A 11/20/2015    Procedure: Abdominal Aortogram; Surgeon: Wellington Hampshire, MD; Location: [place] CV LAB; Service: Cardiovascular; Laterality: N/A;  . Peripheral vascular catheterization Bilateral 11/20/2015    Procedure: Lower Extremity Angiography; Surgeon: Wellington Hampshire, MD; Location: [place] CV LAB; Service: Cardiovascular; Laterality: Bilateral;  . Peripheral vascular catheterization  11/20/2015    Procedure: Peripheral Vascular Balloon Angioplasty; Surgeon: Wellington Hampshire, MD; Location: [place] CV LAB; Service: Cardiovascular;;  . Tonsillectomy    . Appendectomy    . Hernia repair    . Umbilical hernia repair      Family History  Problem Relation Age of Onset  . Adopted: Yes  . Kidney disease Brother     died from  . Kidney disease Sister     died from  . Heart attack Neg Hx   . Stroke Neg Hx     Social History:  reports that she has quit smoking. Her smoking use included Cigarettes. She has a 2.5 pack-year smoking history. She has never used smokeless tobacco. She reports that she does not drink alcohol or use illicit drugs.    Allergies  Allergen Reactions  . Bactrim [Sulfamethoxazole-Trimethoprim] Itching  . Aspirin Nausea Only    GI upset  . Shellfish Allergy Hives    Also nausea  . Codeine Hives and Nausea Only  . Latex Rash  . Penicillins Nausea And Vomiting and Rash    Has patient had a PCN reaction causing immediate rash, facial/tongue/throat swelling, SOB or lightheadedness with hypotension:No Has patient had a PCN reaction causing severe rash involving mucus membranes or skin necrosis:No Has patient had a PCN reaction that required hospitalization:No Has patient had a PCN reaction occurring within the last 10 years:cannot recall If all of the above answers  are "NO", then may proceed with Cephalosporin use.   . Tape Itching and Rash    Please use "paper" tape    Medications Prior to Admission  Medication Sig Dispense Refill  . acetaminophen (TYLENOL) 500 MG tablet Take 500 mg by mouth every 6 (six) hours as needed. For pain    . amLODipine (NORVASC) 5 MG tablet Take 1 tablet (5 mg total) by mouth daily. 90 tablet 0  . azelastine (OPTIVAR) 0.05 % ophthalmic solution Place 1 drop into both eyes 2 (two) times daily as needed (for dry eyes).     [name] b complex-vitamin c-folic acid (NEPHRO-VITE) 0.8 MG TABS Take 1 tablet by mouth daily.     . Calcium Carbonate-Vitamin D (CALTRATE 600+D) 600-400 MG-UNIT per tablet Take 1 tablet by mouth 2 (two) times daily.     . clopidogrel (PLAVIX) 75 MG tablet Take 1 tablet (75 mg total) by mouth daily. 30 tablet 11  . cycloSPORINE modified (NEORAL/GENGRAF) 25 MG capsule Take 25 mg by mouth 2 (two) times daily.     . digoxin (LANOXIN) 0.125 MG tablet Take 0.125 mg by mouth daily.     . ergocalciferol (VITAMIN D2) 50000 UNITS capsule Take 50,000 Units by mouth every 30 (thirty) days. First of the month    . furosemide (  LASIX) 20 MG tablet TAKE 1 TABLET BY MOUTH EVERY OTHER DAY 30 tablet 0  . iron polysaccharides (NIFEREX) 150 MG capsule Take 150 mg by mouth daily.     [name] JANUVIA 50 MG tablet Take 1 tablet by mouth 2 (two) times daily.     [name] LEVEMIR FLEXTOUCH 100 UNIT/ML Pen Inject 10 Units as directed at bedtime.     . lidocaine (XYLOCAINE) 5 % ointment Apply 1 application topically as needed. For foot pain 35.44 g 0  . metoprolol (LOPRESSOR) 100 MG tablet TAKE 1 TABLET BY MOUTH TWICE A DAY 60 tablet 6  . Multiple Vitamins-Minerals (ONE-A-DAY WOMENS 50 PLUS PO) Take 1 tablet by mouth daily.    . mycophenolate (CELLCEPT) 250 MG capsule Take 500 mg by mouth 2 (two) times daily.     . ONE TOUCH ULTRA TEST test strip TEST  BLOOD SUGAR 3 TIMES A DAY (E11.9)  3  . pravastatin (PRAVACHOL) 20 MG tablet Take 1 tablet (20 mg total) by mouth every evening. 90 tablet 3  . predniSONE (DELTASONE) 5 MG tablet Take 5 mg by mouth daily.     . traMADol (ULTRAM) 50 MG tablet Take 1 tablet (50 mg total) by mouth every 8 (eight) hours as needed for moderate pain. 60 tablet 0  . valACYclovir (VALTREX) 500 MG tablet Take 500 mg by mouth every Monday, Wednesday, and Friday. Only on Monday, Wednesday and Friday    . warfarin (COUMADIN) 2.5 MG tablet 1 Tab daily M,W,Th,F,Sun; 1/2 tab daily Tues/Sat. (Patient taking differently: Take 2.5 mg by mouth every afternoon on Sun/Mon/Wed/Thurs/Fri and 1.25 mg on Tues/Sat) 30 tablet 3    Home: Home Living Family/patient expects to be discharged to:: Unsure Living Arrangements: Alone Available Help at Discharge: Family, Available PRN/intermittently Type of Home: House Home Access: Level entry Home Layout: One level Bathroom Toilet: Standard Bathroom Accessibility: No Home Equipment: Environmental consultant - 2 wheels, Walker - 4 wheels, Cane - quad Additional Comments: Pt unable to provide info. PLOF and home living populated from previous admission   Functional History: Prior Function Level of Independence: Independent with assistive device(s) Comments: used rollator mainly for gait (from previous admission note)  Functional Status:  Mobility: Bed Mobility Overal bed mobility: Needs Assistance Bed Mobility: Supine to Sit Rolling: Min assist Supine to sit: HOB elevated, Mod assist Sit to supine: Min guard General bed mobility comments: Able to initiate bringing BLEs to EOB, requires multimodal cues to scoot bottom to EOB; assist to elevate trunk. + dizziness noted with sway. Transfers Overall transfer level: Needs assistance Equipment used: Rolling walker (2 wheeled) Transfers: Sit to/from Stand, [employer] Transfers Sit to Stand: Mod assist Stand pivot  transfers: Mod assist General transfer comment: Mod A to boost from EOB with cues for hand placement/technique. Stood Gaffer. SPT to chair with Mod A due to posterior bias. Wincing in pain with WB. HR variable 38-92 bpm.      ADL: ADL Overall ADL's : Needs assistance/impaired Eating/Feeding: Set up, Sitting Grooming: Wash/dry hands, Wash/dry face, Oral care, Brushing hair, Set up, Sitting Upper Body Bathing: Supervision/ safety, Set up, Sitting Lower Body Bathing: Moderate assistance, Bed level Upper Body Dressing : Minimal assistance Lower Body Dressing: Moderate assistance, Bed level Lower Body Dressing Details (indicate cue type and reason): Pt able to don bil. socks in long sitting  Toilet Transfer: Total assistance Toilet Transfer Details (indicate cue type and reason): Pt unable to attempt due to increased HR  Toileting- Clothing Manipulation and Hygiene:  Total assistance, Bed level General ADL Comments: Increased HR limited activity   Cognition: Cognition Overall Cognitive Status: Difficult to assess Arousal/Alertness: Awake/alert Orientation Level: Oriented to person, Disoriented to time, Disoriented to situation, Oriented to place Attention: Focused, Sustained Focused Attention: Appears intact Sustained Attention: Impaired Sustained Attention Impairment: Functional basic Memory: (unable to assess, patient non-verbal) Awareness: Impaired Awareness Impairment: Intellectual impairment Problem Solving: Impaired Problem Solving Impairment: Functional basic Executive Function: Self Monitoring Self Monitoring: Impaired Self Monitoring Impairment: Functional basic Behaviors: Impulsive Cognition Arousal/Alertness: Awake/alert Behavior During Therapy: WFL for tasks assessed/performed Overall Cognitive Status: Difficult to assess Area of Impairment: Following commands, Attention, Problem solving, Orientation Orientation Level: (Able to state name and Zacarias Pontes.) Current  Attention Level: Focused (progressing to sustained) Following Commands: Follows one step commands with increased time Problem Solving: Slow processing, Requires verbal cues General Comments: Able to answer some questions appropriately and state a few words, "food," Perseverates on yes. "the foot is in the chair" Difficult to assess due to: Impaired communication (aphasia)   Blood pressure 127/63, pulse 60, temperature 99 F (37.2 C), temperature source Oral, resp. rate 20, height _0  (1.575 m), weight 36.2 kg (79 lb 12.9 oz), SpO2 100 %. Physical Exam  Nursing note and vitals reviewed. Constitutional: She appears well-developed. She appears cachectic. She is sleeping. She is easily aroused. She has a sickly appearance. No distress.  HENT:  Head: Normocephalic and atraumatic.  Mouth/Throat: Abnormal dentition.  Teeth with crusted material  Eyes: Conjunctivae and EOM are normal. Pupils are equal, round, and reactive to light.  Neck: Normal range of motion. Neck supple.  Cardiovascular: An irregularly irregular rhythm present.  Murmur heard. Respiratory: Effort normal and breath sounds normal. No respiratory distress. She has no wheezes.  GI: Soft. Bowel sounds are normal. She exhibits no distension. There is no tenderness.  Musculoskeletal: She exhibits tenderness. She exhibits no edema.  Left toes painful to touch--Dry gangrene with atrophy left 2-5th toes and left great toe tip. Right 1st and 2nd toes ischemic appearing with early blister--painted with betadine. Pain with ROM left hip.  Neurological: She is alert and easily aroused.  Tends to slumped to the right. Occasionally moaned and laughed inappropriately. Globally aphasic--verbal output limited to yes. She is unable to state or recognize her name and had occasional echolaly. She is able to follow occasional simple motor commands with Gestural cues.  Skin: Skin is warm and dry. She is not diaphoretic.  Psychiatric: Her affect  is inappropriate. She is slowed. She expresses inappropriate judgment.  Unable to follow commands for manual muscle testing but grossly is 4/5 in both upper limbs and lower limbs with the exception of toe flexion and extension which is nil Sensation cannot assess secondary to aphasia. She does have pain with mild palpation of the left toes  Lab Results Last 48 Hours    Results for orders placed or performed during the hospital encounter of 01/07/16 (from the past 48 hour(s))  Glucose, capillary Status: Abnormal   Collection Time: 01/14/16 4:34 PM  Result Value Ref Range   Glucose-Capillary 271 (H) 65 - 99 mg/dL  Glucose, capillary Status: Abnormal   Collection Time: 01/14/16 9:49 PM  Result Value Ref Range   Glucose-Capillary 204 (H) 65 - 99 mg/dL   Comment 1 Notify RN    Comment 2 Document in Chart   Glucose, capillary Status: Abnormal   Collection Time: 01/15/16 5:55 AM  Result Value Ref Range   Glucose-Capillary 258 (H) 65 - 99  mg/dL   Comment 1 Notify RN    Comment 2 Document in Chart   Basic metabolic panel Status: Abnormal   Collection Time: 01/15/16 7:27 AM  Result Value Ref Range   Sodium 138 135 - 145 mmol/L   Potassium 3.5 3.5 - 5.1 mmol/L   Chloride 96 (L) 101 - 111 mmol/L   CO2 27 22 - 32 mmol/L   Glucose, Bld 230 (H) 65 - 99 mg/dL   BUN 14 6 - 20 mg/dL   Creatinine, Ser 0.74 0.44 - 1.00 mg/dL   Calcium 8.1 (L) 8.9 - 10.3 mg/dL   GFR calc non Af Amer >60 >60 mL/min   GFR calc Af Amer >60 >60 mL/min    Comment: (NOTE) The eGFR has been calculated using the CKD EPI equation. This calculation has not been validated in all clinical situations. eGFR's persistently <60 mL/min signify possible Chronic Kidney Disease.    Anion gap 15 5 - 15  Glucose, capillary Status: Abnormal   Collection Time: 01/15/16 11:54 AM  Result Value Ref Range    Glucose-Capillary 189 (H) 65 - 99 mg/dL  Glucose, capillary Status: Abnormal   Collection Time: 01/15/16 4:25 PM  Result Value Ref Range   Glucose-Capillary 184 (H) 65 - 99 mg/dL  Glucose, capillary Status: Abnormal   Collection Time: 01/15/16 9:15 PM  Result Value Ref Range   Glucose-Capillary 109 (H) 65 - 99 mg/dL   Comment 1 Notify RN    Comment 2 Document in Chart   Glucose, capillary Status: None   Collection Time: 01/16/16 6:41 AM  Result Value Ref Range   Glucose-Capillary 71 65 - 99 mg/dL   Comment 1 Notify RN    Comment 2 Document in Chart   Glucose, capillary Status: None   Collection Time: 01/16/16 11:29 AM  Result Value Ref Range   Glucose-Capillary 99 65 - 99 mg/dL   Comment 1 Notify RN    Comment 2 Document in Chart       Imaging Results (Last 48 hours)    No results found.       Medical Problem List and Plan: 1. Aphasia, gait disorder, cognitive deficits secondary to Left subdural hematoma, left superior inferior pubic ramus fracture status post fall 2. DVT Prophylaxis/Anticoagulation: Mechanical: Sequential compression devices, below knee Bilateral lower extremities 3. Pain Management:  4. Mood: LCSW to follow for evaluation and support.  5. Neuropsych: This patient is not capable of making decisions on her own behalf. 6. Skin/Wound Care: Prevalon boots bilateral feet--elevate when in bed chair. Needs to have shoes on when out of bed.  7. Fluids/Electrolytes/Nutrition: Monitor I/O. Needs assistance with meals.  8. A fib with severe mitral stenosis and symptomatic AS: Monitor for CP or orthostatic symptoms.  9. DM type 2: monitor BS ac/hs. Continue lantus insulin with SSI. Monitor intake--needs to be feed at this time.  10. Malnutrition: Will add protein supplement. 11. CKD s/p renal transplant: ON cyclosporine, prednisone and cellcept.  12. PAD with  claudication and gangrenous change left toes:  13. HTN: Monitor BP bid. Continue norvasc 14. New onset seizures: On keppra 1000 mg bid.      Post Admission Physician Evaluation: 1. Functional deficits secondary to Aphasia, gait disorder, cognitive deficits secondary to Left subdural hematoma, left superior inferior pubic ramus fracture status post fall. 2. Patient is admitted to receive collaborative, interdisciplinary care between the physiatrist, rehab nursing staff, and therapy team. 3. Patient's level of medical complexity and substantial therapy needs in  context of that medical necessity cannot be provided at a lesser intensity of care such as a SNF. 4. Patient has experienced substantial functional loss from his/her baseline which was documented above under the "Functional History" and "Functional Status" headings. Judging by the patient's diagnosis, physical exam, and functional history, the patient has potential for functional progress which will result in measurable gains while on inpatient rehab. These gains will be of substantial and practical use upon discharge in facilitating mobility and self-care at the household level. 5. Physiatrist will provide 24 hour management of medical needs as well as oversight of the therapy plan/treatment and provide guidance as appropriate regarding the interaction of the two. 6. 24 hour rehab nursing will assist with bladder management, bowel management, safety, skin/wound care, disease management, medication administration, pain management and patient education and help integrate therapy concepts, techniques,education, etc. 7. PT will assess and treat for/with: pre gait, gait training, endurance , safety, equipment, neuromuscular re education. Goals are: MinA. 8. OT will assess and treat for/with: ADLs, Cognitive perceptual skills, Neuromuscular re education, safety, endurance, equipment. Goals are: Min A. Therapy may  proceed with showering  this patient. 9. SLP will assess and treat for/with: A aphasia, cognition. Goals are: Communicate basic needs. 10. Case Management and Social Worker will assess and treat for psychological issues and discharge planning. 11. Team conference will be held weekly to assess progress toward goals and to determine barriers to discharge. 12. Patient will receive at least 3 hours of therapy per day at least 5 days per week. 13. ELOS: 14-16 days  14. Prognosis: fair     Charlett Blake M.D. [facility] FAAPM&R (Sports Med, Neuromuscular Med) Diplomate Am Board of Electrodiagnostic Med  01/16/2016

## 2016-01-16 NOTE — Progress Notes (Signed)
Physical Therapy Treatment Patient Details Name: [name] [name] MRN: [id number] DOB: 1946-06-15 Today's Date: 01/16/2016    History of Present Illness 70 y.o. female admitted [facility] on [facility]07[facility]16 with fall out of bed with resultant L SDH/SAH and L inferior/superior pubic rami fx (ortho consulted and pt is WBAT L leg).  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     PT Comments    Patient more verbal today. Continues to have difficulty communicating due to aphasia but able to nod yes appropriately with increased time to basic questions of orientation and making needs known (food). HR variable throughout 38-92 bpm. Tolerated SPT to chair with Mod A of 2 due to weakness and for safety. Eager to get OOB. Pain seems improved but worsens with weightbearing. Will continue to follow.   Follow Up Recommendations  CIR     Equipment Recommendations  None recommended by PT    Recommendations for Other Services       Precautions / Restrictions Precautions Precautions: Fall Precaution Comments: watch HR Restrictions Weight Bearing Restrictions: Yes LLE Weight Bearing: Weight bearing as tolerated    Mobility  Bed Mobility Overal bed mobility: Needs Assistance Bed Mobility: Supine to Sit     Supine to sit: HOB elevated;Mod assist     General bed mobility comments: Able to initiate bringing BLEs to EOB, requires multimodal cues to scoot bottom to EOB; assist to elevate trunk. + dizziness noted with sway.  Transfers Overall transfer level: Needs assistance Equipment used: Rolling walker (2 wheeled) Transfers: Sit to/from [employer] Sit to Stand: Mod assist Stand pivot transfers: Mod assist       General transfer comment: Mod A to boost from EOB with cues for hand placement/technique. Stood Clinical research associate. SPT to chair with  Mod A due to posterior bias. Wincing in pain with WB. HR variable 38-92 bpm.  Ambulation/Gait                 Stairs            Wheelchair Mobility    Modified Rankin (Stroke Patients Only)       Balance Overall balance assessment: Needs assistance Sitting-balance support: Feet supported;No upper extremity supported Sitting balance-Leahy Scale: Good Sitting balance - Comments: Able to sit EOB with BUe support.    Standing balance support: During functional activity;Bilateral upper extremity supported Standing balance-Leahy Scale: Poor Standing balance comment: Reliant on BUEs for support with RW in standing and Min A for pericare. Able to stand ~1 minute.                    Cognition Arousal/Alertness: Awake/alert Behavior During Therapy: WFL for tasks assessed/performed Overall Cognitive Status: Difficult to assess Area of Impairment: Following commands;Attention;Problem solving;Orientation Orientation Level:  (Able to state name and [facility].) Current Attention Level: Focused (progressing to sustained)   Following Commands: Follows one step commands with increased time     Problem Solving: Slow processing;Requires verbal cues General Comments: Able to answer some questions appropriately and state a few words, "food,"  Perseverates on yes. "the foot is in the chair"    Exercises      General Comments General comments (skin integrity, edema, etc.): Pt incontinent of stool. Performed clean up and pericare and changed gown. BP post transfer 130/66; HR 60 bpm 100%  Sp02.      Pertinent Vitals/Pain Pain Assessment: Faces Faces Pain Scale: Hurts even more Pain Location: during mobility of LEs Pain Descriptors / Indicators: Grimacing;Guarding;Moaning Pain Intervention(s): Monitored during session;Repositioned;Limited activity within patient's tolerance    Home Living                      Prior Function            PT Goals (current  goals can now be found in the care plan section) Progress towards PT goals: Progressing toward goals    Frequency  Min 3X/week    PT Plan Current plan remains appropriate    Co-evaluation             End of Session Equipment Utilized During Treatment: Gait belt Activity Tolerance: Patient tolerated treatment well Patient left: in chair;with call bell/phone within reach;with chair alarm set;Other (comment) (SLP in room)     Time: 1026-1100 PT Time Calculation (min) (ACUTE ONLY): 34 min  Charges:  $Therapeutic Activity: 23-37 mins                    G Codes:      Esbeydi Manago A Amiyah Shryock 01/16/2016, 11:17 AM Mylo Red, PT, DPT [phone]

## 2016-01-17 ENCOUNTER — Inpatient Hospital Stay (HOSPITAL_COMMUNITY): Payer: Medicare Other | Admitting: Physical Therapy

## 2016-01-17 ENCOUNTER — Inpatient Hospital Stay (HOSPITAL_COMMUNITY): Payer: Medicare Other | Admitting: Speech Pathology

## 2016-01-17 ENCOUNTER — Inpatient Hospital Stay (HOSPITAL_COMMUNITY): Payer: Medicare Other | Admitting: Occupational Therapy

## 2016-01-17 DIAGNOSIS — S069X2S Unspecified intracranial injury with loss of consciousness of 31 minutes to 59 minutes, sequela: Secondary | ICD-10-CM

## 2016-01-17 LAB — COMPREHENSIVE METABOLIC PANEL
ALT: 136 U/L — AB (ref 14–54)
ANION GAP: 15 (ref 5–15)
AST: 135 U/L — ABNORMAL HIGH (ref 15–41)
Albumin: 3 g/dL — ABNORMAL LOW (ref 3.5–5.0)
Alkaline Phosphatase: 141 U/L — ABNORMAL HIGH (ref 38–126)
BUN: 21 mg/dL — ABNORMAL HIGH (ref 6–20)
CALCIUM: 8 mg/dL — AB (ref 8.9–10.3)
CHLORIDE: 97 mmol/L — AB (ref 101–111)
CO2: 28 mmol/L (ref 22–32)
CREATININE: 0.82 mg/dL (ref 0.44–1.00)
GFR calc Af Amer: >60 mL/min (ref >60)
Glucose, Bld: 50 mg/dL — ABNORMAL LOW (ref 65–99)
Potassium: 2.9 mmol/L — ABNORMAL LOW (ref 3.5–5.1)
Sodium: 140 mmol/L (ref 135–145)
Total Bilirubin: 1.6 mg/dL — ABNORMAL HIGH (ref 0.3–1.2)
Total Protein: 5.8 g/dL — ABNORMAL LOW (ref 6.5–8.1)

## 2016-01-17 LAB — CBC WITH DIFFERENTIAL/PLATELET
BASOS PCT: 0 %
Basophils Absolute: 0 10*3/uL (ref 0.0–0.1)
EOS ABS: 0.1 10*3/uL (ref 0.0–0.7)
EOS PCT: 1 %
HCT: 41.9 % (ref 36.0–46.0)
Hemoglobin: 13.7 g/dL (ref 12.0–15.0)
LYMPHS ABS: 1.2 10*3/uL (ref 0.7–4.0)
Lymphocytes Relative: 9 %
MCH: 31.7 pg (ref 26.0–34.0)
MCHC: 32.7 g/dL (ref 30.0–36.0)
MCV: 97 fL (ref 78.0–100.0)
MONO ABS: 0.8 10*3/uL (ref 0.1–1.0)
Monocytes Relative: 6 %
NEUTROS ABS: 11.5 10*3/uL — AB (ref 1.7–7.7)
NEUTROS PCT: 84 %
PLATELETS: 158 10*3/uL (ref 150–400)
RBC: 4.32 MIL/uL (ref 3.87–5.11)
RDW: 13.8 % (ref 11.5–15.5)
WBC: 13.6 10*3/uL — AB (ref 4.0–10.5)

## 2016-01-17 LAB — GLUCOSE, CAPILLARY
GLUCOSE-CAPILLARY: 113 mg/dL — AB (ref 65–99)
GLUCOSE-CAPILLARY: 258 mg/dL — AB (ref 65–99)
Glucose-Capillary: 182 mg/dL — ABNORMAL HIGH (ref 65–99)
Glucose-Capillary: 164 mg/dL — ABNORMAL HIGH (ref 65–99)
Glucose-Capillary: 99 mg/dL (ref 65–99)

## 2016-01-17 LAB — DIGOXIN LEVEL: DIGOXIN LVL: 0.4 ng/mL — AB (ref 0.8–2.0)

## 2016-01-17 MED ORDER — POTASSIUM CHLORIDE CRYS ER 20 MEQ PO TBCR
20.0000 meq | EXTENDED_RELEASE_TABLET | Freq: Two times a day (BID) | ORAL | Status: DC
  Administered 2016-01-17 (×2): 20 meq via ORAL
  Filled 2016-01-17 (×3): qty 1

## 2016-01-17 MED ORDER — INSULIN ASPART 100 UNIT/ML ~~LOC~~ SOLN
0.0000 [IU] | Freq: Three times a day (TID) | SUBCUTANEOUS | Status: DC
Start: 2016-01-17 — End: 2016-01-23
  Administered 2016-01-17: 2 [IU] via SUBCUTANEOUS
  Administered 2016-01-17: 5 [IU] via SUBCUTANEOUS
  Administered 2016-01-18: 2 [IU] via SUBCUTANEOUS
  Administered 2016-01-18 – 2016-01-19 (×2): 3 [IU] via SUBCUTANEOUS
  Administered 2016-01-19: 5 [IU] via SUBCUTANEOUS
  Administered 2016-01-19: 1 [IU] via SUBCUTANEOUS
  Administered 2016-01-20: 3 [IU] via SUBCUTANEOUS
  Administered 2016-01-20: 1 [IU] via SUBCUTANEOUS
  Administered 2016-01-20: 3 [IU] via SUBCUTANEOUS
  Administered 2016-01-21: 5 [IU] via SUBCUTANEOUS
  Administered 2016-01-21: 2 [IU] via SUBCUTANEOUS
  Administered 2016-01-21 – 2016-01-22 (×3): 3 [IU] via SUBCUTANEOUS
  Administered 2016-01-22: 2 [IU] via SUBCUTANEOUS

## 2016-01-17 MED ORDER — INSULIN GLARGINE 100 UNIT/ML ~~LOC~~ SOLN
13.0000 [IU] | Freq: Every day | SUBCUTANEOUS | Status: DC
  Administered 2016-01-17: 13 [IU] via SUBCUTANEOUS
  Filled 2016-01-17 (×2): qty 0.13

## 2016-01-17 MED ORDER — GLUCERNA SHAKE PO LIQD
237.0000 mL | Freq: Three times a day (TID) | ORAL | Status: DC
  Administered 2016-01-17 – 2016-01-29 (×27): 237 mL via ORAL

## 2016-01-17 MED ORDER — INSULIN ASPART 100 UNIT/ML ~~LOC~~ SOLN
0.0000 [IU] | Freq: Every day | SUBCUTANEOUS | Status: DC
Start: 2016-01-17 — End: 2016-01-23
  Administered 2016-01-19: 2 [IU] via SUBCUTANEOUS
  Administered 2016-01-20: 3 [IU] via SUBCUTANEOUS
  Administered 2016-01-21: 4 [IU] via SUBCUTANEOUS
  Administered 2016-01-22: 2 [IU] via SUBCUTANEOUS

## 2016-01-17 NOTE — Progress Notes (Signed)
Physical Therapy Session Note  Patient Details  Name: [name] [name] MRN: [id number] Date of Birth: 11-27-1945  Today's Date: 01/17/2016 PT Individual Time: 1430-1455 PT Individual Time Calculation (min): 25 min   Short Term Goals: Week 1:  PT Short Term Goal 1 (Week 1): Patient will tolerate standing x 3 min while engaging in functional task.  PT Short Term Goal 2 (Week 1): Patient will retrieve object from floor with min A.  PT Short Term Goal 3 (Week 1): Patient will negotiate up/down 4 stairs using 2 rails with min A.  PT Short Term Goal 4 (Week 1): Patient will ambulate 50 ft using RW with steady assist.  PT Short Term Goal 5 (Week 1): Patient will perform bed mobility with supervision.   Skilled Therapeutic Interventions/Progress Updates:  Patient in bed upon arrival with friend from church at bedside. Patient's friend reported that patient appeared to be trying to rest but was restless. Patient keeping eyes closed unless being actively asked to perform a task. Patient transferred supine > sit with mod A and pulled to stand using RW despite cues and HOH assist to push up from bed with min A. Patient ambulated to/from bathroom using RW with min A and verbal/visual cues for R attention to negotiate obstacles. Patient performed sit <> stand transfers from toilet with min A and hygiene in standing with setup/steadying assist and cues for sequencing. Attempted to have patient ambulate to sink for hand hygiene but patient returning to end of bed. Patient sitting with eyes closed with sudden forward LOB requiring total A to prevent fall/injury. Patient returned to bed and repositioned higher in bed with +2A. Supine BP 96/76, HR 82, Sp02 100% on ra. RN notified of urinary continence and small bowel movement and discussed need for timed toileting due to communication impairments, safety plan updated. Patient left in bed with needs in reach and bed alarm on, RN present.   Therapy  Documentation Precautions:  Precautions Precautions: Fall Precaution Comments: bilateral DARCO shoes Restrictions Weight Bearing Restrictions: No LLE Weight Bearing: Weight bearing as tolerated Pain: Pain Assessment Pain Assessment: Faces Faces Pain Scale: Hurts little more Pain Type: Acute pain Pain Location: Leg Pain Orientation: Left Pain Descriptors / Indicators: Grimacing Pain Onset: With Activity Pain Intervention(s): Rest   See Function Navigator for Current Functional Status.   Therapy/Group: Individual Therapy  Kerney Elbe 01/17/2016, 2:59 PM

## 2016-01-17 NOTE — Progress Notes (Signed)
Blood sugars was 287 Lantus dose was given at approximately 10pm last night. Harvel Ricks, PA was contacted & informed. Orders were given to recheck the blood sugars in 4 hours & monitor. Rechecked blood sugar was 182. No signs & symptoms of hypoglycemia noted.  Will continue to monitor.

## 2016-01-17 NOTE — Evaluation (Signed)
Occupational Therapy Assessment and Plan  Patient Details  Name: [name] [name] MRN: [id number] Date of Birth: 31-Mar-1946  OT Diagnosis: acute pain, apraxia, cognitive deficits, disturbance of vision and muscle weakness (generalized) Rehab Potential: Rehab Potential (ACUTE ONLY): Fair ELOS: 12-14 days   Today's Date: 01/17/2016 OT Individual Time: 0945-1100 OT Individual Time Calculation (min): 75 min     Problem List:  Patient Active Problem List   Diagnosis Date Noted  . TBI (traumatic brain injury) ([place]) 01/16/2016  . Protein-calorie malnutrition, severe 01/14/2016  . SAH (subarachnoid hemorrhage) ([place])   . Benign essential HTN   . Seizures ([place]) 01/13/2016  . Anticoagulated on Coumadin   . Subdural hematoma ([place])   . Chronic atrial fibrillation ([place])   . IDDM (insulin dependent diabetes mellitus) ([place])   . History of renal transplant   . PVD (peripheral vascular disease) ([place])   . Ischemic ulcer of both feet ([place])   . SDH (subdural hematoma) ([place]) 01/07/2016  . Type II diabetes mellitus ([place]) 11/23/2015  . Hypoglycemia 11/23/2015  . Critical lower limb ischemia 11/20/2015  . Encounter for therapeutic drug monitoring 10/06/2013  . Colon cancer screening 06/01/2012  . 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 PRIMARY HYPERPARATHYROIDISM 08/06/2010  . ANEMIA 08/06/2010  . DEPRESSION 08/06/2010  . Mitral stenosis 08/06/2010  . Atrial fibrillation ([place]) 08/06/2010  . 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 DIASTOLIC DYSFUNCTION 40/98/1191  . Cerebral artery occlusion with cerebral infarction ([place]) 08/06/2010  . PVD 08/06/2010  . RENAL FAILURE, END STAGE 08/06/2010  . PACEMAKER, PERMANENT 08/06/2010    Past Medical History:  Past Medical History  Diagnosis Date  . Hypertension   . Stroke [facility]) December 2011    left MCA distribution  . Permanent atrial fibrillation ([place])     coumadin clinic at [facility]. Has previously required Lovenox->Coumadin bridge.  . Severe mitral  stenosis by prior echocardiogram     last echo 07/2012  . Aortic stenosis   . Symptomatic bradycardia     s/p pacer  . Depression   . HSV (herpes simplex virus) infection   . Anemia   . Hyperparathyroidism, primary ([place])     s/p parathyroidectomy  . PAD (peripheral artery disease) ([facility])     a. s/p RLE angioplasty with Dr. Kathlene Cote. b. (by Dr. Fletcher Anon)  L popliteral artery orbital atherectomy and balloon angioplasty & L SFA orbital atherectomy and balloon angioplasty 11/30/12.  [name] PVD (peripheral vascular disease) ([place])   . Presence of permanent cardiac pacemaker   . High cholesterol   . CHF (congestive heart failure) ([place])   . TIA (transient ischemic attack) 08/2009    Archie Endo 09/05/2009  . Atrial thrombus ([place])     appendage/notes 09/05/2009  . Type II diabetes mellitus ([place])   . History of blood transfusion     related to "kidney transplant"  . Chronic kidney disease     s/p renal transplant;  follows with Dr. Edrick Oh  . GERD (gastroesophageal reflux disease)     hx  . History of stomach ulcers   . Diabetic foot ulcer ([place])     "left is worse than right" (11/20/2015)   Past Surgical History:  Past Surgical History  Procedure Laterality Date  . Insert / replace / remove pacemaker    . Kidney transplant  December 1999  . Abdominal angiogram  11/30/2012    ABDOMINAL AORTIC ANGIOGRAM   DR ARDIA  . Dialysis fistula creation    . Abdominal aortagram N/A 11/30/2012    Procedure:  ABDOMINAL AORTAGRAM;  Surgeon: Wellington Hampshire, MD;  Location: [facility] CATH LAB;  Service: Cardiovascular;  Laterality: N/A;  . Peripheral vascular catheterization N/A 11/20/2015    Procedure: Abdominal Aortogram;  Surgeon: Wellington Hampshire, MD;  Location: [place] CV LAB;  Service: Cardiovascular;  Laterality: N/A;  . Peripheral vascular catheterization Bilateral 11/20/2015    Procedure: Lower Extremity Angiography;  Surgeon: Wellington Hampshire, MD;  Location: [place] CV LAB;  Service: Cardiovascular;   Laterality: Bilateral;  . Peripheral vascular catheterization  11/20/2015    Procedure: Peripheral Vascular Balloon Angioplasty;  Surgeon: Wellington Hampshire, MD;  Location: [place] CV LAB;  Service: Cardiovascular;;  . Tonsillectomy    . Appendectomy    . Hernia repair    . Umbilical hernia repair      Assessment & Plan Clinical Impression:[name] [name] is a 70 y.o. female with history fo HTN, CVA, severe mitral stenosis, and symptomatic AS, T2DM, CKD s/p renal transplant,  PAF-on coumadin, gait disorder,  PAD with severe claudication LLE and bilateral foot ulcers (with plans for partial foot amputation next week) who was admitted on 01/07/16 due to fall out of bed--she struck her head and EMS called but patient refused to go to the hospital till later that evening due to inability to get out of bed.  She was noted to have multiple contusion on right side and  Minimally displaced left superior and inferior pubic rami with possible extension into left acetabulum. CT head showed moderate left frontoparietal SDH with  3 mm L to R shift, small SAH in left parietal lobe and right parietal encephalomalacia c/w old R-MCA infarct . She was evaluated by Dr. Annette Stable who recommended reversal of coumadin and serial CT for monitoring. Dr. Fredonia Highland recommended WBAT on LLE.  Follow up CCT showed ncrease in bleed and edema and she did have worsening of expressive aphasia as well focal motor seizure activity.   She was placed on keppra with improvement in symptoms. Serial CCT monitored--last of 05/09 showed mild increase in edema left frontal lobe and associated SAH/SDH without significant improvement. Patient globally aphasic and 24 hour EEG 5/10 showed slowing across right frontoparietal regions question toxic metabolic encephalopathy v/s neuronal dysfunction or cortical irritability. Dr. Annette Stable recommended holding coumadin indefinitely. Plans for amputations deferred per IM. She has had some improvement in ability  to answer Y/N questions,  has had issue with tachybrady issues --HR 40-90 and pain BLE with weight bearing. CIR recommended by rehab team and patient admitted today.    [name] [name] is a 70 y.o. female with history fo HTN, CVA, severe mitral stenosis, and symptomatic AS, T2DM, CKD s/p renal transplant,  PAF-on coumadin, gait disorder,  PAD with severe claudication LLE and bilateral foot ulcers (with plans for partial foot amputation next week) who was admitted on 01/07/16 due to fall out of bed--she struck her head and EMS called but patient refused to go to the hospital till later that evening due to inability to get out of bed.  She was noted to have multiple contusion on right side and  Minimally displaced left superior and inferior pubic rami with possible extension into left acetabulum. CT head showed moderate left frontoparietal SDH with  3 mm L to R shift, small SAH in left parietal lobe and right parietal encephalomalacia c/w old R-MCA infarct . She was evaluated by Dr. Annette Stable who recommended reversal of coumadin and serial CT for monitoring. Dr. Fredonia Highland recommended WBAT on LLE.  Follow up CCT showed ncrease in bleed and edema and she did have worsening of expressive aphasia as well focal motor seizure activity.   She was placed on keppra with improvement in symptoms. Serial CCT monitored--last of 05/09 showed mild increase in edema left frontal lobe and associated SAH/SDH without significant improvement. Patient globally aphasic and 24 hour EEG 5/10 showed slowing across right frontoparietal regions question toxic metabolic encephalopathy v/s neuronal dysfunction or cortical irritability. Dr. Annette Stable recommended holding coumadin indefinitely. Plans for amputations deferred per IM. She has had some improvement in ability to answer Y/N questions,  has had issue with tachybrady issues --HR 40-90 and pain BLE with weight bearing. CIR recommended by rehab team and patient admitted today.     Patient  transferred to CIR on 01/16/2016 .    Patient currently requires mod with basic self-care skills secondary to muscle weakness, decreased cardiorespiratoy endurance, motor apraxia, decreased visual perceptual skills, decreased attention to right, decreased initiation, decreased attention, decreased awareness, decreased problem solving, decreased safety awareness, decreased memory and delayed processing and decreased sitting balance and decreased standing balance.  Prior to hospitalization, patient lived alone and was independent, but with frequent falls.  Patient will benefit from skilled intervention to decrease level of assist with basic self-care skills prior to discharge to SNF or to home with 24 hr S.  Anticipate patient will require 24 hour supervision and OT in SNF setting or HHOT in the home setting.  OT - End of Session Activity Tolerance: Tolerates < 10 min activity, no significant change in vital signs Endurance Deficit: Yes OT Assessment Rehab Potential (ACUTE ONLY): Fair Barriers to Discharge: Decreased caregiver support Barriers to Discharge Comments: pt lives alone, has had several falls in last year OT Patient demonstrates impairments in the following area(s): Balance;Behavior;Cognition;Endurance;Motor;Pain;Perception;Safety;Sensory;Skin Integrity;Vision OT Basic ADL's Functional Problem(s): Eating;Grooming;Bathing;Dressing;Toileting OT Transfers Functional Problem(s): Toilet;Tub/Shower OT Additional Impairment(s): None OT Plan OT Intensity: Minimum of 1-2 x/day, 45 to 90 minutes OT Frequency: 5 out of 7 days OT Duration/Estimated Length of Stay: 12-14 days OT Treatment/Interventions: Balance/vestibular training;Cognitive remediation/compensation;Discharge planning;Pain management;Neuromuscular re-education;Functional mobility training;Patient/family education;Self Care/advanced ADL retraining;Therapeutic Activities;Therapeutic Exercise;UE/LE Strength taining/ROM;UE/LE Coordination  activities;Visual/perceptual remediation/compensation OT Self Feeding Anticipated Outcome(s): S OT Basic Self-Care Anticipated Outcome(s): S OT Toileting Anticipated Outcome(s): S OT Bathroom Transfers Anticipated Outcome(s): S OT Recommendation Patient destination: [facility] (SNF) vs. Home with 24 hr/S Follow Up Recommendations: Skilled nursing facility or home with 24 hr/S Equipment Recommended: To be determined   Skilled Therapeutic Intervention Pt seen for initial evaluation and ADL retraining. Pt sound asleep in bed, with mod facilitation to awaken. Pt with frequent moaning, vocalizations, and yes responses to all questions. She was able to state "Inella Higginson" when asked her name and repeated the words sock, blue, bed clearly.  She was able to follow visual/ tactile commands well, but had more difficulty with verbal commands.  Pt did well with transfers to w/c and then to toilet. Did not attempt shower, due to pain levels and pt making gestures that she was cold. Sat at sink to bathe and pt was able to stand for short periods of time with min A. No clothing available today.  Pt taken to gym at end of session for her next PT session.    OT Evaluation Precautions/Restrictions  Precautions Precautions: Fall Precaution Comments: watch HR Restrictions LLE Weight Bearing: Weight bearing as tolerated      Pain Pain Assessment Pain Assessment: Faces Faces Pain Scale: Hurts whole lot Pain Type:  Acute pain Pain Location: Hip Pain Orientation: Left Pain Descriptors / Indicators: Aching Pain Onset: With Activity Pain Intervention(s): RN made aware Home Living/Prior Functioning Home Living Type of Home: Apartment Home Access: Level entry Bathroom Shower/Tub:  (pt answered yes to both tub and shower)  Lives With: Alone Prior Function Comments: Per admission report, lived alone and drove.  Numerous falls in last year. ADL ADL ADL Comments: refer to functional  navigator Vision/Perception  Vision- History Baseline Vision/History:  (pt unable to report) Vision- Assessment Vision Assessment?: Vision impaired- to be further tested in functional context Additional Comments: Pt unable to follow directions for testing.  Cognition Overall Cognitive Status: No family/caregiver present to determine baseline cognitive functioning Arousal/Alertness: Awake/alert Orientation Level: Person Year: Other (Comment) (cued two thousand, pt said 10) Month:  (could not state) Day of Week:  (could not state) Memory: Impaired Memory Impairment: Other (comment);Retrieval deficit (unable to fully assess to due aphasia) Immediate Memory Recall: Sock;Blue;Bed Memory Recall: Sock;Blue (could not state bed with 3 cues) Memory Recall Sock: With Cue (visual cue) Memory Recall Blue: With Cue (visual cue) Attention: Sustained Sustained Attention: Impaired Sustained Attention Impairment: Verbal basic Awareness: Impaired Awareness Impairment: Intellectual impairment Problem Solving: Impaired Problem Solving Impairment: Functional basic Self Monitoring: Impaired Self Monitoring Impairment: Functional basic Behaviors: Impulsive;Lability Safety/Judgment: Impaired [employer] Scales of Cognitive Functioning: Confused/inappropriate/non-agitated Sensation Sensation Light Touch: Appears Intact (hypersensitive to touch on feet) Stereognosis: Not tested Hot/Cold: Appears Intact Proprioception: Appears Intact (appears intact during transfers) Coordination Gross Motor Movements are Fluid and Coordinated: No Fine Motor Movements are Fluid and Coordinated: No Coordination and Movement Description: pt has full AROM, 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 Motor  Motor Motor: Motor apraxia Mobility    min to mod A with transfers Trunk/Postural Assessment  Thoracic Assessment Thoracic Assessment: Within Functional Limits Lumbar Assessment Lumbar Assessment: Within Functional  Limits Postural Control Postural Control:  (posterior pelvic tilt)  Balance Static Sitting Balance Static Sitting - Level of Assistance: 5: Stand by assistance Dynamic Sitting Balance Dynamic Sitting - Level of Assistance: 4: Min assist Static Standing Balance Static Standing - Level of Assistance: 4: Min assist Dynamic Standing Balance Dynamic Standing - Level of Assistance: 2: Max assist (limited by L hip/ pelvic pain) Extremity/Trunk Assessment RUE Assessment RUE Assessment: Within Functional Limits LUE Assessment LUE Assessment: Within Functional Limits   See Function Navigator for Current Functional Status.   Refer to Care Plan for Long Term Goals  Recommendations for other services: None  Discharge Criteria: Patient will be discharged from OT if patient refuses treatment 3 consecutive times without medical reason, if treatment goals not met, if there is a change in medical status, if patient makes no progress towards goals or if patient is discharged from hospital.  The above assessment, treatment plan, treatment alternatives and goals were discussed and mutually agreed upon: No family available/patient unable  [facility] 01/17/2016, 10:50 AM

## 2016-01-17 NOTE — Progress Notes (Signed)
Speech Language Pathology Daily Session Note  Patient Details  Name: [name] [name] MRN: [id number] Date of Birth: 12/26/1945  Today's Date: 01/17/2016 SLP Individual Time: 1300-1345 SLP Individual Time Calculation (min): 45 min  Short Term Goals: Week 1: SLP Short Term Goal 1 (Week 1): Pt to communicate basic needs via any means with mod A. SLP Short Term Goal 2 (Week 1): Pt able to 1-step directives with mod A. SLP Short Term Goal 3 (Week 1): Pt able to produce single words with mod A for compensatory strategies- semantic cueing, sentence completion, etc. SLP Short Term Goal 4 (Week 1): Pt able to maintain attention to task for 5 minutes with mod A. SLP Short Term Goal 5 (Week 1): Pt able to demonstrate yes/no response with 70% acc with max A.  Skilled Therapeutic Interventions: Pt alert and pleasant. Hard working with encouragement to participate. Pt with approximately 20% success with utilizing sentence completion with repetition. Mod A of visual and tactile cueing to shift attention to R. Discussed communication and cognition strategies with the pt's daughter, [name]. Pt demo'd ability to read spontaneously at the single word level. Pt continued to answer all  all questions with a "yes" response, but shook her head "no" at the same time when "no" was the intended response.   Function:  Eating Eating   Modified Consistency Diet: Yes Eating Assist Level: Set up assist for;Supervision or verbal cues;Helper checks for pocketed food;Helper scoops food on utensil;Helper brings food to mouth;Help managing cup/glass;Help with picking up utensils   Eating Set Up Assist For: Opening containers;Cutting food Helper Scoops Food on Utensil: Every scoop Helper Brings Food to Mouth: Occasionally   Cognition Comprehension Comprehension assist level: Understands basic less than 25% of the time/ requires cueing >75% of the time  Expression   Expression assist level: Expresses basis less than 25%  of the time/requires cueing >75% of the time.  Social Interaction Social Interaction assist level: Interacts appropriately less than 25% of the time. May be withdrawn or combative.  Problem Solving Problem solving assist level: Solves basic less than 25% of the time - needs direction nearly all the time or does not effectively solve problems and may need a restraint for safety  Memory Memory assist level: Recognizes or recalls less than 25% of the time/requires cueing greater than 75% of the time    Pain Pain Assessment Pain Assessment: Faces Faces Pain Scale: No hurt Pain Type: Acute pain Pain Location: Generalized Pain Orientation: Left Pain Descriptors / Indicators: Grimacing;Moaning Pain Frequency: Intermittent Pain Onset: With Activity Pain Intervention(s): Repositioned;Ambulation/increased activity;Rest  Therapy/Group: Individual Therapy  Rocky Crafts MA, CCC-SLP 01/17/2016, 2:06 PM

## 2016-01-17 NOTE — Evaluation (Signed)
Speech Language Pathology Assessment and Plan  Patient Details  Name: [name] [name] MRN: [id number] Date of Birth: 1946/09/04  SLP Diagnosis: Aphasia;Cognitive Impairments;Dysphagia  Rehab Potential: Good ELOS: 12-14 days    Today's Date: 01/17/2016 SLP Individual Time: 0800-0900 SLP Individual Time Calculation (min): 60 min   Problem List:  Patient Active Problem List   Diagnosis Date Noted  . TBI (traumatic brain injury) ([place]) 01/16/2016  . Protein-calorie malnutrition, severe 01/14/2016  . SAH (subarachnoid hemorrhage) ([place])   . Benign essential HTN   . Seizures ([place]) 01/13/2016  . Anticoagulated on Coumadin   . Subdural hematoma ([place])   . Chronic atrial fibrillation ([place])   . IDDM (insulin dependent diabetes mellitus) ([place])   . History of renal transplant   . PVD (peripheral vascular disease) ([place])   . Ischemic ulcer of both feet ([place])   . SDH (subdural hematoma) ([place]) 01/07/2016  . Type II diabetes mellitus ([place]) 11/23/2015  . Hypoglycemia 11/23/2015  . Critical lower limb ischemia 11/20/2015  . Encounter for therapeutic drug monitoring 10/06/2013  . Colon cancer screening 06/01/2012  . 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 PRIMARY HYPERPARATHYROIDISM 08/06/2010  . ANEMIA 08/06/2010  . DEPRESSION 08/06/2010  . Mitral stenosis 08/06/2010  . Atrial fibrillation ([place]) 08/06/2010  . 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 DIASTOLIC DYSFUNCTION 85/63/1497  . Cerebral artery occlusion with cerebral infarction ([place]) 08/06/2010  . PVD 08/06/2010  . RENAL FAILURE, END STAGE 08/06/2010  . PACEMAKER, PERMANENT 08/06/2010   Past Medical History:  Past Medical History  Diagnosis Date  . Hypertension   . Stroke [facility]) December 2011    left MCA distribution  . Permanent atrial fibrillation ([place])     coumadin clinic at [facility]. Has previously required Lovenox->Coumadin bridge.  . Severe mitral stenosis by prior echocardiogram     last echo 07/2012  . Aortic stenosis    . Symptomatic bradycardia     s/p pacer  . Depression   . HSV (herpes simplex virus) infection   . Anemia   . Hyperparathyroidism, primary ([place])     s/p parathyroidectomy  . PAD (peripheral artery disease) ([facility])     a. s/p RLE angioplasty with Dr. Kathlene Cote. b. (by Dr. Fletcher Anon)  L popliteral artery orbital atherectomy and balloon angioplasty & L SFA orbital atherectomy and balloon angioplasty 11/30/12.  [name] PVD (peripheral vascular disease) ([place])   . Presence of permanent cardiac pacemaker   . High cholesterol   . CHF (congestive heart failure) ([place])   . TIA (transient ischemic attack) 08/2009    Archie Endo 09/05/2009  . Atrial thrombus ([place])     appendage/notes 09/05/2009  . Type II diabetes mellitus ([place])   . History of blood transfusion     related to "kidney transplant"  . Chronic kidney disease     s/p renal transplant;  follows with Dr. Edrick Oh  . GERD (gastroesophageal reflux disease)     hx  . History of stomach ulcers   . Diabetic foot ulcer ([place])     "left is worse than right" (11/20/2015)   Past Surgical History:  Past Surgical History  Procedure Laterality Date  . Insert / replace / remove pacemaker    . Kidney transplant  December 1999  . Abdominal angiogram  11/30/2012    ABDOMINAL AORTIC ANGIOGRAM   DR ARDIA  . Dialysis fistula creation    . Abdominal aortagram N/A 11/30/2012    Procedure: ABDOMINAL Maxcine Ham;  Surgeon: Wellington Hampshire, MD;  Location: [facility] CATH LAB;  Service: Cardiovascular;  Laterality: N/A;  . Peripheral vascular catheterization N/A 11/20/2015    Procedure: Abdominal Aortogram;  Surgeon: Wellington Hampshire, MD;  Location: [place] CV LAB;  Service: Cardiovascular;  Laterality: N/A;  . Peripheral vascular catheterization Bilateral 11/20/2015    Procedure: Lower Extremity Angiography;  Surgeon: Wellington Hampshire, MD;  Location: [place] CV LAB;  Service: Cardiovascular;  Laterality: Bilateral;  . Peripheral vascular catheterization  11/20/2015     Procedure: Peripheral Vascular Balloon Angioplasty;  Surgeon: Wellington Hampshire, MD;  Location: [place] CV LAB;  Service: Cardiovascular;;  . Tonsillectomy    . Appendectomy    . Hernia repair    . Umbilical hernia repair      Assessment / Plan / Recommendation Clinical Impression   [name] [name] is a 70 y.o. female with history fo HTN, CVA, severe mitral stenosis, and symptomatic AS, T2DM, CKD s/p renal transplant, PAF-on coumadin, gait disorder, PAD with severe claudication LLE and bilateral foot ulcers (with plans for partial foot amputation next week) who was admitted on 01/07/16 due to fall out of bed--she struck her head and EMS called but patient refused to go to the hospital till later that evening due to inability to get out of bed. She was noted to have multiple contusion on right side and Minimally displaced left superior and inferior pubic rami with possible extension into left acetabulum. CT head showed moderate left frontoparietal SDH with 3 mm L to R shift, small SAH in left parietal lobe and right parietal encephalomalacia c/w old R-MCA infarct.  Pt participated in speech/language/cognitive evaluation and swallow assessment upon transfer to CIR. Pt demonstrates global aphasia and indication for cognitive impairment as well. Pt is tolerating a Dys 3 diet which appears to be the most appropriate given pt's dentition and impulsivity. Will focus on cognitive-linguistic goals and if appropriate consider upgrade to regular diet later during rehab stay.  Skilled Therapeutic Interventions          Pt was unable to participate in standardized assessment, however language is characterized by echolalia and perseveration. Intermittent tearfulness noted, however pt was able to be redirected. Pt answered "yes" to all yes/no questions and followed directives during functional tasks only.   SLP Assessment  Patient will need skilled Speech Lanaguage Pathology Services during CIR admission     Recommendations  SLP Diet Recommendations: Dysphagia 3 (Mech soft);Thin Liquid Administration via: Cup;Straw Medication Administration: Whole meds with liquid Supervision: Staff to assist with self feeding;Full supervision/cueing for compensatory strategies Compensations: Minimize environmental distractions;Slow rate;Small sips/bites Postural Changes and/or Swallow Maneuvers: Seated upright 90 degrees Oral Care Recommendations: Oral care BID Patient destination:  (pending family support) Follow up Recommendations: 24 hour supervision/assistance;Home Health SLP;Outpatient SLP;Skilled Nursing facility Equipment Recommended: None recommended by SLP    SLP Frequency 3 to 5 out of 7 days   SLP Duration  SLP Intensity  SLP Treatment/Interventions 12-14 days  Minumum of 1-2 x/day, 30 to 90 minutes  Cognitive remediation/compensation;Multimodal communication approach;Speech/Language facilitation;Functional tasks;Cueing hierarchy;Therapeutic Activities;Dysphagia/aspiration precaution training;Patient/family education    Pain Pain Assessment Pain Assessment: Faces Faces Pain Scale: Hurts even more Pain Type: Acute pain Pain Location: Generalized Pain Orientation: Left Pain Descriptors / Indicators: Grimacing;Moaning Pain Frequency: Intermittent Pain Onset: With Activity Pain Intervention(s): Repositioned;Ambulation/increased activity;Rest  Prior Functioning Cognitive/Linguistic Baseline: Information not available Type of Home: Apartment  Lives With: Alone  Function:  Eating Eating   Modified Consistency Diet: Yes Eating Assist Level: Set up assist for;Supervision or verbal cues;Helper checks  for pocketed food;Helper scoops food on utensil;Helper brings food to mouth;Help managing cup/glass;Help with picking up utensils   Eating Set Up Assist For: Opening containers;Cutting food Helper [place] on Utensil: Every scoop Helper Brings Food to Mouth: Occasionally    Cognition Comprehension Comprehension assist level: Understands basic less than 25% of the time/ requires cueing >75% of the time  Expression   Expression assist level: Expresses basis less than 25% of the time/requires cueing >75% of the time.  Social Interaction Social Interaction assist level: Interacts appropriately less than 25% of the time. May be withdrawn or combative.  Problem Solving Problem solving assist level: Solves basic less than 25% of the time - needs direction nearly all the time or does not effectively solve problems and may need a restraint for safety  Memory Memory assist level: Recognizes or recalls less than 25% of the time/requires cueing greater than 75% of the time   Short Term Goals: Week 1: SLP Short Term Goal 1 (Week 1): Pt to communicate basic needs via any means with mod A. SLP Short Term Goal 2 (Week 1): Pt able to 1-step directives with mod A. SLP Short Term Goal 3 (Week 1): Pt able to produce single words with mod A for compensatory strategies- semantic cueing, sentence completion, etc. SLP Short Term Goal 4 (Week 1): Pt able to maintain attention to task for 5 minutes with mod A. SLP Short Term Goal 5 (Week 1): Pt able to demonstrate yes/no response with 70% acc with max A.  Refer to Care Plan for Long Term Goals  Recommendations for other services: None  Discharge Criteria: Patient will be discharged from SLP if patient refuses treatment 3 consecutive times without medical reason, if treatment goals not met, if there is a change in medical status, if patient makes no progress towards goals or if patient is discharged from hospital.  The above assessment, treatment plan, treatment alternatives and goals were discussed and mutually agreed upon: No family available/patient unable  Vinetta Bergamo MA, [place]  01/17/2016, 12:15 PM

## 2016-01-17 NOTE — Progress Notes (Signed)
Patient information reviewed and entered into eRehab system by Layaan Mott, RN, CRRN, PPS Coordinator.  Information including medical coding and functional independence measure will be reviewed and updated through discharge.     Per nursing patient was given "Data Collection Information Summary for Patients in Inpatient Rehabilitation Facilities with attached "Privacy Act Statement-Health Care Records" upon admission.  

## 2016-01-17 NOTE — Evaluation (Signed)
Physical Therapy Assessment and Plan  Patient Details  Name: PAISLI SILFIES MRN: [id number] Date of Birth: 08/26/1946  PT Diagnosis: Cognitive deficits, Difficulty walking, Muscle weakness and Pain in LLE Rehab Potential: Good ELOS: 12-14 days   Today's Date: 01/17/2016 PT Individual Time: 1105-1200 PT Individual Time Calculation (min): 55 min    Problem List:  Patient Active Problem List   Diagnosis Date Noted  . TBI (traumatic brain injury) ([place]) 01/16/2016  . Protein-calorie malnutrition, severe 01/14/2016  . SAH (subarachnoid hemorrhage) ([place])   . Benign essential HTN   . Seizures ([place]) 01/13/2016  . Anticoagulated on Coumadin   . Subdural hematoma ([place])   . Chronic atrial fibrillation ([place])   . IDDM (insulin dependent diabetes mellitus) ([place])   . History of renal transplant   . PVD (peripheral vascular disease) ([place])   . Ischemic ulcer of both feet ([place])   . SDH (subdural hematoma) ([place]) 01/07/2016  . Type II diabetes mellitus ([place]) 11/23/2015  . Hypoglycemia 11/23/2015  . Critical lower limb ischemia 11/20/2015  . Encounter for therapeutic drug monitoring 10/06/2013  . Colon cancer screening 06/01/2012  . 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 PRIMARY HYPERPARATHYROIDISM 08/06/2010  . ANEMIA 08/06/2010  . DEPRESSION 08/06/2010  . Mitral stenosis 08/06/2010  . Atrial fibrillation ([place]) 08/06/2010  . 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 DIASTOLIC DYSFUNCTION 66/29/4765  . Cerebral artery occlusion with cerebral infarction ([place]) 08/06/2010  . PVD 08/06/2010  . RENAL FAILURE, END STAGE 08/06/2010  . PACEMAKER, PERMANENT 08/06/2010    Past Medical History:  Past Medical History  Diagnosis Date  . Hypertension   . Stroke [facility]) December 2011    left MCA distribution  . Permanent atrial fibrillation ([place])     coumadin clinic at [facility]. Has previously required Lovenox->Coumadin bridge.  . Severe mitral stenosis by prior echocardiogram     last echo 07/2012  .  Aortic stenosis   . Symptomatic bradycardia     s/p pacer  . Depression   . HSV (herpes simplex virus) infection   . Anemia   . Hyperparathyroidism, primary ([place])     s/p parathyroidectomy  . PAD (peripheral artery disease) ([facility])     a. s/p RLE angioplasty with Dr. Kathlene Cote. b. (by Dr. Fletcher Anon)  L popliteral artery orbital atherectomy and balloon angioplasty & L SFA orbital atherectomy and balloon angioplasty 11/30/12.  [name] PVD (peripheral vascular disease) ([place])   . Presence of permanent cardiac pacemaker   . High cholesterol   . CHF (congestive heart failure) ([place])   . TIA (transient ischemic attack) 08/2009    Archie Endo 09/05/2009  . Atrial thrombus ([place])     appendage/notes 09/05/2009  . Type II diabetes mellitus ([place])   . History of blood transfusion     related to "kidney transplant"  . Chronic kidney disease     s/p renal transplant;  follows with Dr. Edrick Oh  . GERD (gastroesophageal reflux disease)     hx  . History of stomach ulcers   . Diabetic foot ulcer ([place])     "left is worse than right" (11/20/2015)   Past Surgical History:  Past Surgical History  Procedure Laterality Date  . Insert / replace / remove pacemaker    . Kidney transplant  December 1999  . Abdominal angiogram  11/30/2012    ABDOMINAL AORTIC ANGIOGRAM   DR ARDIA  . Dialysis fistula creation    . Abdominal aortagram N/A 11/30/2012    Procedure: ABDOMINAL Maxcine Ham;  Surgeon: Wellington Hampshire,  MD;  Location: [facility] CATH LAB;  Service: Cardiovascular;  Laterality: N/A;  . Peripheral vascular catheterization N/A 11/20/2015    Procedure: Abdominal Aortogram;  Surgeon: Wellington Hampshire, MD;  Location: [place] CV LAB;  Service: Cardiovascular;  Laterality: N/A;  . Peripheral vascular catheterization Bilateral 11/20/2015    Procedure: Lower Extremity Angiography;  Surgeon: Wellington Hampshire, MD;  Location: [place] CV LAB;  Service: Cardiovascular;  Laterality: Bilateral;  . Peripheral vascular catheterization   11/20/2015    Procedure: Peripheral Vascular Balloon Angioplasty;  Surgeon: Wellington Hampshire, MD;  Location: [place] CV LAB;  Service: Cardiovascular;;  . Tonsillectomy    . Appendectomy    . Hernia repair    . Umbilical hernia repair      Assessment & Plan Clinical Impression: PAULA BUSENBARK is a 70 y.o. female with history fo HTN, CVA, severe mitral stenosis, and symptomatic AS, T2DM, CKD s/p renal transplant, PAF-on coumadin, gait disorder, PAD with severe claudication LLE and bilateral foot ulcers (with plans for partial foot amputation next week) who was admitted on 01/07/16 due to fall out of bed--she struck her head and EMS called but patient refused to go to the hospital till later that evening due to inability to get out of bed. She was noted to have multiple contusion on right side and Minimally displaced left superior and inferior pubic rami with possible extension into left acetabulum. 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 She was evaluated by Dr. Annette Stable who recommended reversal of coumadin and serial CT for monitoring. Dr. Fredonia Highland recommended WBAT on LLE. Follow up CCT showed ncrease in bleed and edema and she did have worsening of expressive aphasia as well focal motor seizure activity.   She was placed on keppra with improvement in symptoms. Serial CCT monitored--last of 05/09 showed mild increase in edema left frontal lobe and associated SAH/SDH without significant improvement. Patient globally aphasic and 24 hour EEG 5/10 showed slowing across right frontoparietal regions question toxic metabolic encephalopathy v/s neuronal dysfunction or cortical irritability. Dr. Annette Stable recommended holding coumadin indefinitely. Plans for amputations deferred per IM. She has had some improvement in ability to answer Y/N questions, has had issue with tachybrady issues  --HR 40-90 and pain BLE with weight bearing. Patient transferred to CIR on 01/16/2016.   Patient currently requires min with mobility secondary to muscle weakness, decreased cardiorespiratoy endurance, unbalanced muscle activation and motor apraxia, decreased attention to right, decreased attention, decreased awareness, decreased problem solving, decreased safety awareness, decreased memory and delayed processing and decreased standing balance and decreased balance strategies.  Prior to hospitalization, patient was modified independent  with mobility and lived with Alone in a Apartment ([place]) home.  Home access is  Level entry.  Patient will benefit from skilled PT intervention to maximize safe functional mobility, minimize fall risk and decrease caregiver burden for planned discharge home with 24 hour supervision.  Anticipate patient will benefit from follow up [place] at discharge.  PT - End of Session Activity Tolerance: Tolerates 30+ min activity with multiple rests Endurance Deficit: Yes Endurance Deficit Description: required supine rest break and multiple seated rest breaks due to fatigue PT Assessment Rehab Potential (ACUTE/IP ONLY): Good Barriers to Discharge: Decreased caregiver support PT Patient demonstrates impairments in the following area(s): Balance;Behavior;Endurance;Motor;Nutrition;Pain;Safety PT Transfers Functional Problem(s): Bed Mobility;Bed to Chair;Car;Furniture PT Locomotion Functional Problem(s): Ambulation;Wheelchair Mobility;Stairs  PT Plan PT Intensity: Minimum of 1-2 x/day ,45 to 90 minutes PT Frequency: 5 out of 7 days PT Duration Estimated Length of Stay: 12-14 days PT Treatment/Interventions: Ambulation/gait training;Balance/vestibular training;Cognitive remediation/compensation;Community reintegration;Discharge planning;Disease management/prevention;DME/adaptive equipment instruction;Functional mobility training;Neuromuscular re-education;Pain management;Patient/family  education;Psychosocial support;Stair training;Therapeutic Activities;Therapeutic Exercise;UE/LE Strength taining/ROM;UE/LE Coordination activities;Wheelchair propulsion/positioning;Visual/perceptual remediation/compensation PT Transfers Anticipated Outcome(s): supervision PT Locomotion Anticipated Outcome(s): supervision household ambulator PT Recommendation Follow Up Recommendations: Home health PT;24 hour supervision/assistance Patient destination: Home Equipment Recommended: To be determined Equipment Details: per chart, patient owns RW, rollator, and quad cane  Skilled Therapeutic Intervention Skilled therapeutic intervention initiated after completion of evaluation. No family present. Patient followed simple functional commands with gestures with increased time or impulsively due to fatigue/pain. Patient moaning and rocking back and forth intermittently throughout evaluation but easily redirected and provided multiple rest breaks including 1 supine rest. Patient answered, "yes" to all questions or statements. At end of session, patient sat edge of bed to take medications and then left in L sidelying with RN present.   PT Evaluation Precautions/Restrictions Precautions Precautions: Fall Precaution Comments: bilateral DARCO shoes Restrictions LLE Weight Bearing: Weight bearing as tolerated General Chart Reviewed: Yes Family/Caregiver Present: No   Pain Pain Assessment Pain Assessment: Faces Faces Pain Scale: Hurts even more Pain Type: Acute pain Pain Location: Generalized Pain Orientation: Left Pain Descriptors / Indicators: Grimacing;Moaning Pain Frequency: Intermittent Pain Onset: With Activity Pain Intervention(s): Repositioned;Ambulation/increased activity;Rest Home Living/Prior Functioning Home Living Available Help at Discharge: Family;Available PRN/intermittently Type of Home: Apartment (Condo) Home Access: Level entry Home Layout: One level Bathroom Toilet:  Standard  Lives With: Alone Prior Function Level of Independence: Independent with gait;Requires assistive device for independence;Independent with transfers Comments: Per admission report, lived alone and drove.  Vision/Perception  Vision - Assessment Additional Comments: Pt unable to follow directions for testing.  Cognition Overall Cognitive Status: No family/caregiver present to determine baseline cognitive functioning Arousal/Alertness: Awake/alert Attention: Sustained Sustained Attention: Impaired Sustained Attention Impairment: Verbal basic Memory: Impaired Memory Impairment: Other (comment);Retrieval deficit (unable to fully assess to due aphasia) Awareness: Impaired Awareness Impairment: Intellectual impairment Problem Solving: Impaired Problem Solving Impairment: Functional basic Self Monitoring: Impaired Self Monitoring Impairment: Functional basic Behaviors: Impulsive;Lability Safety/Judgment: Impaired Rancho Los Amigos Scales of Cognitive Functioning: Confused/inappropriate/non-agitated Sensation Sensation Light Touch: Appears Intact (hypersensitive to touch on feet) Stereognosis: Not tested Hot/Cold: Appears Intact Proprioception: Appears Intact Coordination Gross Motor Movements are Fluid and Coordinated: No Fine Motor Movements are Fluid and Coordinated: No Coordination and Movement Description: pt has full AROM, 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 Motor  Motor Motor: Motor apraxia Motor - Skilled Clinical Observations: limited by pain/generalized deconditioning  Mobility Bed Mobility Bed Mobility: Rolling Right;Rolling Left;Sit to Supine;Supine to Sit Rolling Right: 4: Min guard Rolling Left: 5: Supervision Supine to Sit: 3: Mod assist Sit to Supine: 5: Supervision Transfers Transfers: Yes Sit to Stand: 4: Min guard;With upper extremity assist Stand to Sit: 4: Min assist;With upper extremity assist Stand Pivot Transfers: 2: Max assist Locomotion   Ambulation Ambulation: Yes Ambulation/Gait Assistance: 4: Min assist Ambulation Distance (Feet): 25 Feet Assistive device: Rolling walker Gait Gait: Yes Gait Pattern: Impaired Gait Pattern: Step-to pattern;Antalgic;Narrow base of support;Decreased stride length;Decreased weight shift to left Gait velocity: decreased Stairs / Additional Locomotion Stairs: Yes Stairs Assistance: 4: Min assist Stair Management Technique: Two rails;Step to pattern;Forwards Number of Stairs: 2 Height of Stairs: 3 Architect: Yes Wheelchair Assistance: 2: Max Lexicographer: Both upper extremities Wheelchair Parts Management: Needs assistance Distance: 10  ft  Trunk/Postural Assessment  Cervical Assessment Cervical Assessment: Within Functional Limits Thoracic Assessment Thoracic Assessment: Within Functional Limits Lumbar Assessment Lumbar Assessment: Exceptions to [facility] (posterior pelvic tilt) Postural Control Postural Control: Within Functional Limits  Balance Static Sitting Balance Static Sitting - Level of Assistance: 5: Stand by assistance Dynamic Sitting Balance Dynamic Sitting - Level of Assistance: 4: Min assist Static Standing Balance Static Standing - Level of Assistance: 4: Min assist Dynamic Standing Balance Dynamic Standing - Level of Assistance: 4: Min assist Extremity Assessment  RUE Assessment RUE Assessment: Within Functional Limits LUE Assessment LUE Assessment: Within Functional Limits RLE Assessment RLE Assessment: Within Functional Limits LLE Assessment LLE Assessment: Exceptions to [facility] LLE Strength LLE Overall Strength: Deficits;Due to pain LLE Overall Strength Comments: unable to formally asssess, antalgic gait, avoids LLE WB   See Function Navigator for Current Functional Status.   Refer to Care Plan for Long Term Goals  Recommendations for other services: None  Discharge Criteria: Patient will be discharged from  PT if patient refuses treatment 3 consecutive times without medical reason, if treatment goals not met, if there is a change in medical status, if patient makes no progress towards goals or if patient is discharged from hospital.  The above assessment, treatment plan, treatment alternatives and goals were discussed and mutually agreed upon: No family available/patient unable  Laretta Alstrom 01/17/2016, 12:25 PM

## 2016-01-17 NOTE — Progress Notes (Signed)
Orthopedic Tech Progress Note Patient Details:  [name] [name] 1945-09-10 [id number]      [name] Called [facility] for bilateral Darco shoes.  01/17/2016, 12:06 PM

## 2016-01-17 NOTE — Progress Notes (Signed)
[place] PHYSICAL MEDICINE & REHABILITATION     PROGRESS NOTE    Subjective/Complaints: Sleeping but arouses easily. Appears comfortable  ROS: limited due to aphasia/cognition  Objective: Vital Signs: Blood pressure 121/74, pulse 70, temperature 97.7 F (36.5 C), temperature source Oral, resp. rate 18, height 4\' 11"  (1.499 m), weight 36.4 kg (80 lb 4 oz), SpO2 100 %. No results found.  Recent Labs  01/17/16 0606  WBC 13.6*  HGB 13.7  HCT 41.9  PLT 158    Recent Labs  01/15/16 0727 01/17/16 0606  NA 138 140  K 3.5 2.9*  CL 96* 97*  GLUCOSE 230* 50*  BUN 14 21*  CREATININE 0.74 0.82  CALCIUM 8.1* 8.0*   CBG (last 3)   Recent Labs  01/16/16 2055 01/17/16 0113 01/17/16 0639  GLUCAP 287* 182* 113*    Wt Readings from Last 3 Encounters:  01/16/16 36.4 kg (80 lb 4 oz)  01/07/16 36.2 kg (79 lb 12.9 oz)  12/24/15 41.912 kg (92 lb 6.4 oz)    Physical Exam:  Constitutional: She appears well-developed. She appears cachectic. She is sleeping. She is easily aroused. She has a sickly appearance. No distress.  HENT:  Head: Normocephalic and atraumatic.  Mouth/Throat: Abnormal dentition.  Teeth with crusted material  Eyes: Conjunctivae and EOM are normal. Pupils are equal, round, and reactive to light.  Neck: Normal range of motion. Neck supple.  Cardiovascular: An irregularly irregular rhythm present.  Murmur heard. Respiratory: Effort normal and breath sounds normal. No respiratory distress. She has no wheezes.  GI: Soft. Bowel sounds are normal. She exhibits no distension. There is no tenderness.  Musculoskeletal: She exhibits tenderness. She exhibits no edema.  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 Right 1st and 2nd toes ischemic appearing with early blister--painted with betadine.  Pain with ROM left hip persistent.  Neurological: She is alert and easily aroused.  Globally  aphasic--verbal output limited to yes.  Did protrude tonge and squeeze my hand when cued. Couldn't follow other simple commands. She is unable to state or recognize her name and had occasional echolaly.   Skin: Skin is warm and dry. She is not diaphoretic.  Psychiatric: Her affect is inappropriate. She is slowed.  She expresses inappropriate judgment.  Unable to follow commands for manual muscle testing but grossly is 4/5 in both upper limbs and lower limbs with the exception of toe flexion and extension which is nil Sensation cannot assess secondary to aphasia. She does have pain with mild palpation of the left toes  Assessment/Plan: 1. Aphasia, cognitive, and functional deficits  secondary to TBI which require 3+ hours per day of interdisciplinary therapy in a comprehensive inpatient rehab setting. Physiatrist is providing close team supervision and 24 hour management of active medical problems listed below. Physiatrist and rehab team continue to assess barriers to discharge/monitor patient progress toward functional and medical goals.  Function:  Bathing Bathing position Bathing activity did not occur: Safety/medical concerns Position: Bed  Bathing parts   Body parts bathed by helper: Front perineal area, Buttocks  Bathing assist Assist Level: Touching or steadying assistance(Pt > 75%)      Upper Body Dressing/Undressing Upper body dressing Upper body dressing/undressing activity did not occur: Safety/medical concerns What is the patient wearing?: Hospital gown                Upper body assist Assist Level: Touching or steadying assistance(Pt > 75%)  Lower Body Dressing/Undressing Lower body dressing                                  Lower body assist        Toileting Toileting Toileting activity did not occur: Safety/medical concerns   Toileting steps completed by helper: Performs perineal hygiene, Adjust clothing after toileting    Toileting  assist Assist level: Touching or steadying assistance (Pt.75%)   Transfers Chair/bed transfer             Locomotion Ambulation           Wheelchair          Cognition Comprehension Comprehension assist level: Follows basic conversation/direction with extra time/assistive device  Expression Expression assist level: Expresses basis less than 25% of the time/requires cueing >75% of the time.  Social Interaction Social Interaction assist level: Interacts appropriately 25 - 49% of time - Needs frequent redirection.  Problem Solving Problem solving assist level: Solves basic less than 25% of the time - needs direction nearly all the time or does not effectively solve problems and may need a restraint for safety  Memory Memory assist level: Recognizes or recalls less than 25% of the time/requires cueing greater than 75% of the time   Medical Problem List and Plan: 1. Aphasia, gait disorder, cognitive deficits secondary to Left subdural hematoma, left superior inferior pubic ramus fracture status post fall  -begin CIR therapies 2. DVT Prophylaxis/Anticoagulation: Mechanical: Sequential compression devices, below knee Bilateral lower extremities 3. Pain Management: does not appear to be in pain  -follow as she mobilizes with therapy 4. Mood: LCSW to follow for evaluation and support.  5. Neuropsych: This patient is not capable of making decisions on her own behalf.  6. Skin/Wound Care: Prevalon boots bilateral feet--elevate when in bed chair. Needs to have shoes on when out of bed.  7. Fluids/Electrolytes/Nutrition: Monitor I/O. Needs assistance with meals.  8. A fib with severe mitral stenosis and symptomatic AS:  Monitor for CP or orthostatic symptoms.  9. DM type 2: monitor BS ac/hs. Continue lantus insulin with SSI. Monitor intake--needs to be feed at this time.  10. Malnutrition:  protein supplement/encourage po  -increased arousal should help 11. CKD s/p renal  transplant: ON cyclosporine, prednisone and cellcept.  12. PAD with claudication and gangrenous change left toes: conservative care for now---will need amputation at some point 13. HTN: Monitor BP bid. Continue norvasc 14. New onset seizures: On keppra 1000 mg bid.      LOS (Days) 1 A FACE TO FACE EVALUATION WAS PERFORMED  SWARTZ,ZACHARY T 01/17/2016 8:37 AM

## 2016-01-17 NOTE — Care Management Note (Signed)
Inpatient Rehabilitation Center Individual Statement of Services  Patient Name:  [name] [name]  Date:  01/17/2016  Welcome to the Inpatient Rehabilitation Center.  Our goal is to provide you with an individualized program based on your diagnosis and situation, designed to meet your specific needs.  With this comprehensive rehabilitation program, you will be expected to participate in at least 3 hours of rehabilitation therapies Monday-Friday, with modified therapy programming on the weekends.  Your rehabilitation program will include the following services:  Physical Therapy (PT), Occupational Therapy (OT), Speech Therapy (ST), 24 hour per day rehabilitation nursing, Therapeutic Recreaction (TR), Neuropsychology, Case Management (Social Worker), Rehabilitation Medicine, Nutrition Services and Pharmacy Services  Weekly team conferences will be held on Tuesdays to discuss your progress.  Your Social Worker will talk with you frequently to get your input and to update you on team discussions.  Team conferences with you and your family in attendance may also be held.  Expected length of stay: 12-14 days  Overall anticipated outcome: supervision  Depending on your progress and recovery, your program may change. Your Social Worker will coordinate services and will keep you informed of any changes. Your Social Worker's name and contact numbers are listed  below.  The following services may also be recommended but are not provided by the Inpatient Rehabilitation Center:    Home Health Rehabiltiation Services  Outpatient Rehabilitation Services    Arrangements will be made to provide these services after discharge if needed.  Arrangements include referral to agencies that provide these services.  Your insurance has been verified to be:  Medicare and Medicaid Your primary doctor is:  Marva Panda  Pertinent information will be shared with your doctor and your insurance company.  Social  Worker:  [place], [place] [phone] or [phone]   Information discussed with and copy given to patient by: Amada Jupiter, 01/17/2016, 1:10 PM

## 2016-01-17 NOTE — Discharge Instructions (Signed)
Inpatient Rehab Discharge Instructions  [name] [name] Discharge date and time:    Activities/Precautions/ Functional Status: Activity: activity as tolerated with supervision Diet: diabetic diet Wound Care:   Functional status:  ___ No restrictions     ___ Walk up steps independently ___ 24/7 supervision/assistance   ___ Walk up steps with assistance ___ Intermittent supervision/assistance  ___ Bathe/dress independently ___ Walk with walker     ___ Bathe/dress with assistance ___ Walk Independently    ___ Shower independently ___ Walk with assistance    ___ Shower with assistance ___ No alcohol     ___ Return to work/school ________  Special Instructions: 1. Wear protective shoes at all times. 2. Check blood sugars 2-3 times a day.    My questions have been answered and I understand these instructions. I will adhere to these goals and the provided educational materials after my discharge from the hospital.  Patient/Caregiver Signature _______________________________ Date __________  Clinician Signature _______________________________________ Date __________  Please bring this form and your medication list with you to all your follow-up doctor's appointments.

## 2016-01-17 NOTE — Progress Notes (Signed)
Initial Nutrition Assessment  DOCUMENTATION CODES:   Severe malnutrition in context of chronic illness, Underweight  INTERVENTION:  Glucerna TID. Each supplement provides 220 kcals and 10 grams of protein. Magic Cup BID. Each supplement provides 290 kcals and 9 grams of protein. Discontinue Prostat.   NUTRITION DIAGNOSIS:   Malnutrition related to chronic illness as evidenced by severe depletion of muscle mass, severe depletion of body fat.   GOAL:   Patient will meet greater than or equal to 90% of their needs   MONITOR:   PO intake, Supplement acceptance, Labs, Weight trends, Skin, I & O's  REASON FOR ASSESSMENT:    (low bmi)    ASSESSMENT:   Pt with history fo HTN, CVA, severe mitral stenosis, and symptomatic AS, T2DM, CKD s/p renal transplant, PAF-on coumadin, gait disorder, PAD with severe claudication LLE and bilateral foot ulcers (with plans for partial foot amputation next week) who was admitted on 01/07/16 due to fall out of bed--she struck her head and EMS called but patient refused to go to the hospital till later that evening due to inability to get out of bed.   Pt asleep at time of visit. Per RN, pt will follow commands but mainly responds with "yes" to questions. RN says pt will not take the prostat. She either vomits it or holds it in her mouth. Pt consumes some of the glucerna.  Eating well, PO's 80-95%.     Per chart review, pt has experienced a 13% weight loss in the past month. This is significant for time frame.  Will continue Glucerna shake and magic cup to supplement diet.  NFPE: severe muscle depletion, severe fat depletion, no edema.  Labs reviewed; K 2.9, Cl 97, Ca 8.0, CBGs 58-287. Meds reviewed; Iron polysaccharides, KCl, Vitamin D.  Diet Order:  DIET DYS 3 Room service appropriate?: Yes; Fluid consistency:: Thin  Skin:  Wound (see comment) (Pu on toes, diabetic PU on ankle)  Last BM:  5/10  Height:   Ht Readings from Last 1  Encounters:  01/16/16  (1.499 m)    Weight:   Wt Readings from Last 1 Encounters:  01/16/16 80 lb 4 oz (36.4 kg)    Ideal Body Weight:  50 kg  BMI:  Body mass index is 16.2 kg/(m^2).  Estimated Nutritional Needs:   Kcal:  1500-1700   Protein:  75-85  Fluid:  1.5 L  EDUCATION NEEDS:   No education needs identified at this time  Beryle Quant, MS [facility] Dietetic Intern Pager [phone]

## 2016-01-18 ENCOUNTER — Inpatient Hospital Stay (HOSPITAL_COMMUNITY): Payer: Medicare Other | Admitting: Physical Therapy

## 2016-01-18 ENCOUNTER — Inpatient Hospital Stay (HOSPITAL_COMMUNITY): Payer: Medicare Other | Admitting: Occupational Therapy

## 2016-01-18 ENCOUNTER — Inpatient Hospital Stay (HOSPITAL_COMMUNITY): Payer: Medicare Other | Admitting: Speech Pathology

## 2016-01-18 LAB — BASIC METABOLIC PANEL
ANION GAP: 12 (ref 5–15)
BUN: 28 mg/dL — ABNORMAL HIGH (ref 6–20)
CHLORIDE: 100 mmol/L — AB (ref 101–111)
CO2: 29 mmol/L (ref 22–32)
CREATININE: 0.97 mg/dL (ref 0.44–1.00)
Calcium: 8 mg/dL — ABNORMAL LOW (ref 8.9–10.3)
GFR calc non Af Amer: 59 mL/min — ABNORMAL LOW (ref >60)
Glucose, Bld: 45 mg/dL — ABNORMAL LOW (ref 65–99)
POTASSIUM: 3.2 mmol/L — AB (ref 3.5–5.1)
SODIUM: 141 mmol/L (ref 135–145)

## 2016-01-18 LAB — URINALYSIS, ROUTINE W REFLEX MICROSCOPIC
Glucose, UA: NEGATIVE mg/dL
Ketones, ur: NEGATIVE mg/dL
LEUKOCYTES UA: NEGATIVE
NITRITE: NEGATIVE
PH: 5 (ref 5.0–8.0)
Protein, ur: NEGATIVE mg/dL
SPECIFIC GRAVITY, URINE: 1.023 (ref 1.005–1.030)

## 2016-01-18 LAB — GLUCOSE, CAPILLARY
GLUCOSE-CAPILLARY: 37 mg/dL — AB (ref 65–99)
GLUCOSE-CAPILLARY: 44 mg/dL — AB (ref 65–99)
GLUCOSE-CAPILLARY: 157 mg/dL — AB (ref 65–99)
GLUCOSE-CAPILLARY: 235 mg/dL — AB (ref 65–99)
GLUCOSE-CAPILLARY: 203 mg/dL — AB (ref 65–99)
Glucose-Capillary: 38 mg/dL — CL (ref 65–99)
Glucose-Capillary: 192 mg/dL — ABNORMAL HIGH (ref 65–99)
Glucose-Capillary: 165 mg/dL — ABNORMAL HIGH (ref 65–99)

## 2016-01-18 LAB — URINE MICROSCOPIC-ADD ON: WBC, UA: NONE SEEN WBC/hpf (ref 0–5)

## 2016-01-18 MED ORDER — INSULIN GLARGINE 100 UNIT/ML ~~LOC~~ SOLN
5.0000 [IU] | Freq: Every day | SUBCUTANEOUS | Status: DC
  Administered 2016-01-19 – 2016-01-21 (×4): 5 [IU] via SUBCUTANEOUS
  Filled 2016-01-18 (×5): qty 0.05

## 2016-01-18 MED ORDER — LEVETIRACETAM 100 MG/ML PO SOLN: 1000.0000 mg | Freq: Two times a day (BID) | ORAL | Status: DC

## 2016-01-18 MED ORDER — LEVETIRACETAM 100 MG/ML PO SOLN
500.0000 mg | Freq: Once | ORAL | Status: AC
  Administered 2016-01-18: 500 mg via ORAL
  Filled 2016-01-18: qty 5

## 2016-01-18 MED ORDER — LEVETIRACETAM 100 MG/ML PO SOLN
750.0000 mg | Freq: Two times a day (BID) | ORAL | Status: DC
  Administered 2016-01-19 – 2016-01-29 (×22): 750 mg via ORAL
  Filled 2016-01-18 (×22): qty 10

## 2016-01-18 MED ORDER — POTASSIUM CHLORIDE 20 MEQ/15ML (10%) PO SOLN
20.0000 meq | Freq: Two times a day (BID) | ORAL | Status: DC
  Administered 2016-01-18 – 2016-01-20 (×4): 20 meq via ORAL
  Filled 2016-01-18 (×5): qty 15

## 2016-01-18 MED ORDER — GLUCOSE 40 % PO GEL
ORAL | Status: AC
  Administered 2016-01-18: 37.5 g
  Filled 2016-01-18: qty 1

## 2016-01-18 NOTE — Significant Event (Signed)
Hypoglycemic Event  CBG: 44  Treatment: 15 GM carbohydrate snack  Symptoms: None  Follow-up CBG: Time:0850 CBG Result:157  Possible Reasons for Event: Unknown  Comments/MD notified:     Bluford Kaufmann

## 2016-01-18 NOTE — Progress Notes (Signed)
Physical Therapy Session Note  Patient Details  Name: [name] [name] MRN: [id number] Date of Birth: [birth year]  Today's Date: 01/18/2016 PT Individual Time: 4098-1191 PT Individual Time Calculation (min): 71 min   Short Term Goals: Week 1:  PT Short Term Goal 1 (Week 1): Patient will tolerate standing x 3 min while engaging in functional task.  PT Short Term Goal 2 (Week 1): Patient will retrieve object from floor with min A.  PT Short Term Goal 3 (Week 1): Patient will negotiate up/down 4 stairs using 2 rails with min A.  PT Short Term Goal 4 (Week 1): Patient will ambulate 50 ft using RW with steady assist.  PT Short Term Goal 5 (Week 1): Patient will perform bed mobility with supervision.   Skilled Therapeutic Interventions/Progress Updates:   Pt received asleep in the bed; required extra time, tactile and verbal cues to awaken.  When cued to sit EOB pt began to cry out loudly and say, "no, no, no."  Allowed pt 2-3 minutes and then cued pt again.  This time pt performed supine > sit with mod A and transferred to w/c with min HHA.  At RN station pt continued to cry out and indicated she was in pain, RN notified for pain medication. Also inquired about [facility] shoes for pt; RN to contact ortho.  In gym pt performed stand pivot to mat with min HHA.  Seated on mat attempted to engage pt in card matching activity for sustained attention and for balance and sit <> stand training but pt unable to attend to task; pt very distracted by environment, pain and began yelling out, "hot, hot, hot!" and leaning over on therapist.  Pt BP assessed and recorded.  Pt CBG also checked, all WFL.  Allowed pt to rest in supine and then returned pt to sitting with mod A.  Pt engaged in functional task of changing pillow cases in sitting for continued sustained attention.  Required mod verbal cues and assistance to attend to and complete task.  Transitioned back to w/c stand pivot min A and returned to room where pt  engaged in more functional task of brushing teeth at sink seated in w/c; pt required mod A to complete task due to pt with incorrect sequencing of task.  Transitioned into bathroom where pt performed stand pivot transfers w/c <> toilet with grab bar and min A.  Pt able to have a BM on toilet but required assistance for all toileting tasks + min A for balance.  Returned to bed stand pivot min A and sit > supine with supervision-min A.  Pt covered up her head and rolled away from therapist immediately.  Pt left with all items within reach.  Ortho present to discuss [employer] shoes; ortho tech reports that order was put in for outside vendor: [employer] and will have to await delivery from [employer].    Therapy Documentation Precautions:  Precautions Precautions: Fall Precaution Comments: bilateral [employer] shoes Restrictions Weight Bearing Restrictions: Yes LLE Weight Bearing: Weight bearing as tolerated Vital Signs: Therapy Vitals Temp: 97.4 F (36.3 C) Temp Source: Oral Pulse Rate: 87 Resp: 18 BP: (!) 133/97 mmHg Patient Position (if appropriate): Lying Oxygen Therapy SpO2: 100 % O2 Device: Not Delivered Pain: Pain Assessment Pain Assessment: Faces Faces Pain Scale: Hurts a little bit Pain Type: Acute pain Pain Location: Toe (Comment which one) Pain Orientation: Right;Left Pain Descriptors / Indicators: Aching Pain Frequency: Intermittent Pain Onset: On-going Pain Intervention(s): Medication (See eMAR)  See Function  Navigator for Current Functional Status.   Therapy/Group: Individual Therapy  Edman Circle [facility] 01/18/2016, 3:39 PM

## 2016-01-18 NOTE — Progress Notes (Signed)
Occupational Therapy Session Note  Patient Details  Name: ABRYANNA VANDEWIELE MRN: [id number] Date of Birth: 22-Aug-1946  Today's Date: 01/18/2016 OT Individual Time:  - 0900-1000  (60 min)      Short Term Goals: Week 1:  OT Short Term Goal 1 (Week 1): Pt will don shirt with min A. OT Short Term Goal 2 (Week 1): Pt will don pants with mod A. OT Short Term Goal 3 (Week 1): Pt will transfer into shower with min A. OT Short Term Goal 4 (Week 1): Pt will be able to toilet with min A. :     Skilled Therapeutic Interventions/Progress Updates:    Pt lying in bed asleep.  Sugar level back to 150; was 33 earlier.  Pt difficult to arouse.  Pt transfers from bed to wc > shower bench.>wc >toilet >wc with min to mod assist.  Showers on bench with mod assist.  Dresses with assist for organizing and sequencing for clothes.  Shows decreased initiation and engagement in activity.  Has BM while in shower.  Transfer to toilet for completion of BM.  Pt stands using grab bar for peri care during shower, toileting, and dressing with min assist.  Nursing assisted with end of care due to time frame.   Therapy Documentation Precautions:  Precautions Precautions: Fall Precaution Comments: bilateral [employer] shoes Restrictions Weight Bearing Restrictions: No LLE Weight Bearing: Weight bearing as tolerated    Vital Signs: Therapy Vitals Temp: 97.5 F (36.4 C) Temp Source: Oral Pulse Rate: 90 Resp: 18 BP: (!) 140/91 mmHg Patient Position (if appropriate): Lying Oxygen Therapy SpO2: 100 % O2 Device: Not Delivered Pain:  Yells out occasionally during session   ADL: ADL ADL Comments: refer to functional navigator    See Function Navigator for Current Functional Status.   Therapy/Group: Individual Therapy  Humberto Seals 01/18/2016, 7:45 AM

## 2016-01-18 NOTE — Progress Notes (Signed)
Patient intermittently spitting out liquid/gel meds. Will hold in mouth. Chewed Keppra this am, second dose given liquid form, called pharmacy to change keppra, potassium to liquid form. Took some pills and liquids without trouble. Will continue to monitor and report to oncoming RN.

## 2016-01-18 NOTE — Progress Notes (Signed)
Speech Language Pathology Daily Session Note  Patient Details  Name: [name] [name] MRN: [id number] Date of Birth: 01-19-1946  Today's Date: 01/18/2016 SLP Individual Time: 1000-1100 SLP Individual Time Calculation (min): 60 min  Short Term Goals: Week 1: SLP Short Term Goal 1 (Week 1): Pt to communicate basic needs via any means with mod A. SLP Short Term Goal 2 (Week 1): Pt able to 1-step directives with mod A. SLP Short Term Goal 3 (Week 1): Pt able to produce single words with mod A for compensatory strategies- semantic cueing, sentence completion, etc. SLP Short Term Goal 4 (Week 1): Pt able to maintain attention to task for 5 minutes with mod A. SLP Short Term Goal 5 (Week 1): Pt able to demonstrate yes/no response with 70% acc with max A.  Skilled Therapeutic Interventions: Pt seen up in WC. Hardworking and labile, but responds well to redirection and encouragement. Automatic speech: counting 1-10 mod I, days of week 3/7 with mod A, singing familiar songs 80% acc in unison with therapist. Yes/No- 55% acc- all yes responses except 1 "no" despite max cueing of visual and verbal cueing. Labeling common objects: 7/15 mod I, then additional 6 with phonemic/semantic cueing. Single word reading 100% acc, but inability to match words to objects in a field of 2 despite mod A. 1-step directives 50% acc. Pt appeared encouraged by progress.    Function:  Eating Eating                 Cognition Comprehension Comprehension assist level: Understands basic 25 - 49% of the time/ requires cueing 50 - 75% of the time  Expression   Expression assist level: Expresses basis less than 25% of the time/requires cueing >75% of the time.  Social Interaction Social Interaction assist level: Interacts appropriately 25 - 49% of time - Needs frequent redirection.  Problem Solving Problem solving assist level: Solves basic less than 25% of the time - needs direction nearly all the time or does not  effectively solve problems and may need a restraint for safety  Memory Memory assist level: Recognizes or recalls less than 25% of the time/requires cueing greater than 75% of the time    Pain Pain Assessment Pain Assessment: Faces Faces Pain Scale: Hurts a little bit   Therapy/Group: Individual Therapy  Rocky Crafts MA, CCC-SLP 01/18/2016, 3:19 PM

## 2016-01-18 NOTE — IPOC Note (Signed)
Overall Plan of Care [facility]) Patient Details Name: [name] [name] MRN: [id number] DOB: [birth year]  Admitting Diagnosis: TBI  Hospital Problems: Active Problems:   TBI (traumatic brain injury) (HCC)     Functional Problem List: Nursing Behavior, Bladder, Bowel, Nutrition, Pain, Skin Integrity  PT Balance, Behavior, Endurance, Motor, Nutrition, Pain, Safety  OT Balance, Behavior, Cognition, Endurance, Motor, Pain, Perception, Safety, Sensory, Skin Integrity, Vision  SLP Cognition, Behavior, Linguistic, Nutrition, Safety  TR         Basic ADL's: OT Eating, Grooming, Bathing, Dressing, Toileting     Advanced  ADL's: OT       Transfers: PT Bed Mobility, Bed to Chair, Car, Occupational psychologist, Research scientist (life sciences): PT Ambulation, Psychologist, prison and probation services, Stairs     Additional Impairments: OT None  SLP Swallowing, Communication, Social Cognition comprehension, expression Social Interaction, Problem Solving, Memory, Attention, Awareness  TR      Anticipated Outcomes Item Anticipated Outcome  Self Feeding S  Swallowing  mod I for least restrictive diet   Basic self-care  S  Toileting  S   Bathroom Transfers S  Bowel/Bladder  Cont B/B by discharge min assist with tolerating   Transfers  supervision  Locomotion  supervision household ambulator  Communication  min A for basic needs  Cognition  min- mod A for basic  Pain  pain managed at goal 3/10  Safety/Judgment  usage proper equipment by discharge    Therapy Plan: PT Intensity: Minimum of 1-2 x/day ,45 to 90 minutes PT Frequency: 5 out of 7 days PT Duration Estimated Length of Stay: 12-14 days OT Intensity: Minimum of 1-2 x/day, 45 to 90 minutes OT Frequency: 5 out of 7 days OT Duration/Estimated Length of Stay: 12-14 days SLP Intensity: Minumum of 1-2 x/day, 30 to 90 minutes SLP Frequency: 3 to 5 out of 7 days SLP Duration/Estimated Length of Stay: 12-14 days       Team Interventions: Nursing  Interventions Patient/Family Education, Bladder Management, Bowel Management, Pain Management, Medication Management, Skin Care/Wound Management, Cognitive Remediation/Compensation, Discharge Planning  PT interventions Ambulation/gait training, Balance/vestibular training, Cognitive remediation/compensation, Community reintegration, Discharge planning, Disease management/prevention, DME/adaptive equipment instruction, Functional mobility training, Neuromuscular re-education, Pain management, Patient/family education, Psychosocial support, Stair training, Therapeutic Activities, Therapeutic Exercise, UE/LE Strength taining/ROM, UE/LE Coordination activities, Wheelchair propulsion/positioning, Visual/perceptual remediation/compensation  OT Interventions Balance/vestibular training, Cognitive remediation/compensation, Discharge planning, Pain management, Neuromuscular re-education, Functional mobility training, Patient/family education, Self Care/advanced ADL retraining, Therapeutic Activities, Therapeutic Exercise, UE/LE Strength taining/ROM, UE/LE Coordination activities, Visual/perceptual remediation/compensation  SLP Interventions Cognitive remediation/compensation, Multimodal communication approach, Speech/Language facilitation, Functional tasks, Cueing hierarchy, Therapeutic Activities, Dysphagia/aspiration precaution training, Patient/family education  TR Interventions    SW/CM Interventions Discharge Planning, Psychosocial Support, Patient/Family Education    Team Discharge Planning: Destination: PT-Home ,OT- Skilled Nursing Facility (SNF) , SLP- (pending family support) Projected Follow-up: PT-Home health PT, 24 hour supervision/assistance, OT-  Skilled nursing facility, SLP-24 hour supervision/assistance, Home Health SLP, Outpatient SLP, Skilled Nursing facility Projected Equipment Needs: PT-To be determined, OT- To be determined, SLP-None recommended by SLP Equipment Details: PT-per chart,  patient owns RW, rollator, and quad cane, OT-  Patient/family involved in discharge planning: PT- Patient unable/family or caregiver not available,  OT-Patient unable/family or caregiver not available, SLP-Patient unable/family or caregive not available  MD ELOS: 12-14 days Medical Rehab Prognosis:  Good Assessment: The patient has been admitted for CIR therapies with the diagnosis of TBI. The team will be addressing functional mobility, strength, stamina, balance, safety,  adaptive techniques and equipment, self-care, bowel and bladder mgt, patient and caregiver education, NMR, cognitive behavioral mgt, cognition, language, swallowing, communication, pain mgt. Goals have been set at supervision to min assist with mobility, self-care, and min to mod assist with cognition/communication.    Ranelle Oyster, MD, FAAPMR      See Team Conference Notes for weekly updates to the plan of care

## 2016-01-18 NOTE — Significant Event (Signed)
Hypoglycemic Event  CBG: 37  Treatment: 1 tube instant glucose  Symptoms: None  Follow-up CBG: [id number] CBG Result:44  Possible Reasons for Event: Unknown  Comments/MD notified:Dr. Kellie Simmering, Carlynn Spry

## 2016-01-18 NOTE — Progress Notes (Signed)
Orthopedic Tech Progress Note Patient Details:  [name] [name] December 31, 1945 [id number]  Patient ID: [name], female   DOB: [birth year], 70 y.o.   MRN: [id number] Called in advanced brace order; spoke with the answering service  Nikki Dom 01/18/2016, 3:14 PM

## 2016-01-18 NOTE — Progress Notes (Signed)
[facility] PHYSICAL MEDICINE & REHABILITATION     PROGRESS NOTE    Subjective/Complaints: Sugars quite low this morning--started out at 37---RN having difficulty getting it up---finally up to 157 at 0900  ROS: limited due to aphasia/cognition  Objective: Vital Signs: Blood pressure 154/78, pulse 90, temperature 97.5 F (36.4 C), temperature source Oral, resp. rate 18, height 4\' 11"  (1.499 m), weight 36.8 kg (81 lb 2.1 oz), SpO2 100 %. No results found.  Recent Labs  01/17/16 0606  WBC 13.6*  HGB 13.7  HCT 41.9  PLT 158    Recent Labs  01/17/16 0606 01/18/16 0651  NA 140 141  K 2.9* 3.2*  CL 97* 100*  GLUCOSE 50* 45*  BUN 21* 28*  CREATININE 0.82 0.97  CALCIUM 8.0* 8.0*   CBG (last 3)   Recent Labs  01/18/16 0727 01/18/16 0754 01/18/16 0856  GLUCAP 38* 37* 157*    Wt Readings from Last 3 Encounters:  01/18/16 36.8 kg (81 lb 2.1 oz)  01/07/16 36.2 kg (79 lb 12.9 oz)  12/24/15 41.912 kg (92 lb 6.4 oz)    Physical Exam:  Constitutional: She appears well-developed. She appears cachectic. She is sleeping. She is easily aroused. She has a sickly appearance. No distress.  HENT:  Head: Normocephalic and atraumatic.  Mouth/Throat: Abnormal dentition.  Teeth with crusted material  Eyes: Conjunctivae and EOM are normal. Pupils are equal, round, and reactive to light.  Neck: Normal range of motion. Neck supple.  Cardiovascular: An irregularly irregular rhythm present.  Murmur heard. Respiratory: Effort normal and breath sounds normal. No respiratory distress. She has no wheezes.  GI: Soft. Bowel sounds are normal. She exhibits no distension. There is no tenderness.  Musculoskeletal: She exhibits tenderness. She exhibits no edema.  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 Right 1st and 2nd toes ischemic appearing with early blister--painted with betadine.  Pain with ROM left hip     Neurological: She is alert and easily aroused.  Globally aphasic--verbal output limited to yes generally.     Skin: Skin is warm and dry. She is not diaphoretic.  Psychiatric: Her affect is inappropriate. She is slowed.  She expresses inappropriate judgment.         Assessment/Plan: 1. Aphasia, cognitive, and functional deficits  secondary to TBI which require 3+ hours per day of interdisciplinary therapy in a comprehensive inpatient rehab setting. Physiatrist is providing close team supervision and 24 hour management of active medical problems listed below. Physiatrist and rehab team continue to assess barriers to discharge/monitor patient progress toward functional and medical goals.  Function:  Bathing Bathing position Bathing activity did not occur: Safety/medical concerns Position: Wheelchair/chair at sink  Bathing parts Body parts bathed by patient: Right arm, Left arm, Chest, Abdomen, Front perineal area, Buttocks, Right upper leg, Left upper leg Body parts bathed by helper: Right lower leg, Left lower leg, Back  Bathing assist Assist Level: Touching or steadying assistance(Pt > 75%)      Upper Body Dressing/Undressing Upper body dressing Upper body dressing/undressing activity did not occur: Safety/medical concerns What is the patient wearing?: Hospital gown                Upper body assist Assist Level: Touching or steadying assistance(Pt > 75%)      Lower Body Dressing/Undressing Lower body dressing   What is the patient wearing?: Hospital Gown, Non-skid slipper socks  Non-skid slipper socks- Performed by helper: Don/doff right sock, Don/doff left sock                  Lower body assist Assist for lower body dressing: Touching or steadying assistance (Pt > 75%)      Toileting Toileting Toileting activity did not occur: Safety/medical concerns Toileting steps completed by patient: Performs perineal hygiene Toileting steps completed by  helper: Performs perineal hygiene, Adjust clothing prior to toileting Toileting Assistive Devices: Grab bar or rail  Toileting assist Assist level: Touching or steadying assistance (Pt.75%)   Transfers Chair/bed transfer   Chair/bed transfer method: Stand pivot Chair/bed transfer assist level: Maximal assist (Pt 25 - 49%/lift and lower) Chair/bed transfer assistive device: Armrests     Locomotion Ambulation     Max distance: 25 Assist level: Touching or steadying assistance (Pt > 75%)   Wheelchair   Type: Manual Max wheelchair distance: 10 Assist Level: Maximal assistance (Pt 25 - 49%)  Cognition Comprehension Comprehension assist level: Understands basic less than 25% of the time/ requires cueing >75% of the time  Expression Expression assist level: Expresses basis less than 25% of the time/requires cueing >75% of the time.  Social Interaction Social Interaction assist level: Interacts appropriately less than 25% of the time. May be withdrawn or combative.  Problem Solving Problem solving assist level: Solves basic less than 25% of the time - needs direction nearly all the time or does not effectively solve problems and may need a restraint for safety  Memory Memory assist level: Recognizes or recalls less than 25% of the time/requires cueing greater than 75% of the time   Medical Problem List and Plan: 1. Aphasia, gait disorder, cognitive deficits secondary to Left subdural hematoma, left superior inferior pubic ramus fracture status post fall  -begin CIR therapies 2. DVT Prophylaxis/Anticoagulation: Mechanical: Sequential compression devices, below knee Bilateral lower extremities 3. Pain Management: does not appear to be in pain  -follow as she mobilizes with therapy 4. Mood: LCSW to follow for evaluation and support.  5. Neuropsych: This patient is not capable of making decisions on her own behalf.  6. Skin/Wound Care: Prevalon boots bilateral feet--elevate when in bed  chair. Needs to have shoes on when out of bed.  7. Fluids/Electrolytes/Nutrition: Monitor I/O. Needs assistance with meals.  8. A fib with severe mitral stenosis and symptomatic AS:  Monitor for CP or orthostatic symptoms.  9. DM type 2: monitor BS ac/hs.  Decrease lantus to 5u qhs due to hypoglycemia and inadequate intake.  -. Monitor intake--needs to be feed at this time.  10. Malnutrition:  protein supplement/encourage po  -increasing arousal should help 11. CKD s/p renal transplant: ON cyclosporine, prednisone and cellcept.  12. PAD with claudication and gangrenous change left toes: conservative care for now---will need amputation at some point 13. HTN: Monitor BP bid. Continue norvasc 14. New onset seizures: On keppra 1000 mg bid---reduce to  bid due to lethargy     LOS (Days) 2 A FACE TO FACE EVALUATION WAS PERFORMED  Gaius Ishaq T 01/18/2016 9:22 AM

## 2016-01-18 NOTE — Significant Event (Signed)
Hypoglycemic Event  CBG: 33  Treatment: 15 GM carbohydrate snack  Symptoms: None  Follow-up CBG: Time:0755 CBG Result:37  Possible Reasons for Event: Unknown  Comments/MD notified:Dr. Milda Smart, Lesli Albee

## 2016-01-19 ENCOUNTER — Inpatient Hospital Stay (HOSPITAL_COMMUNITY): Payer: Medicare Other | Admitting: Physical Therapy

## 2016-01-19 LAB — GLUCOSE, CAPILLARY
GLUCOSE-CAPILLARY: 216 mg/dL — AB (ref 65–99)
GLUCOSE-CAPILLARY: 257 mg/dL — AB (ref 65–99)
GLUCOSE-CAPILLARY: 228 mg/dL — AB (ref 65–99)
Glucose-Capillary: 135 mg/dL — ABNORMAL HIGH (ref 65–99)

## 2016-01-19 LAB — URINE CULTURE: CULTURE: NO GROWTH

## 2016-01-19 NOTE — Progress Notes (Signed)
Physical Therapy Session Note  Patient Details  Name: [name] [name] MRN: [id number] Date of Birth: 03-Mar-1946  Today's Date: 01/19/2016 PT Individual Time: 0800-0900 PT Individual Time Calculation (min): 60 min   Short Term Goals: Week 1:  PT Short Term Goal 1 (Week 1): Patient will tolerate standing x 3 min while engaging in functional task.  PT Short Term Goal 2 (Week 1): Patient will retrieve object from floor with min A.  PT Short Term Goal 3 (Week 1): Patient will negotiate up/down 4 stairs using 2 rails with min A.  PT Short Term Goal 4 (Week 1): Patient will ambulate 50 ft using RW with steady assist.  PT Short Term Goal 5 (Week 1): Patient will perform bed mobility with supervision.   Skilled Therapeutic Interventions/Progress Updates:   Patient awake in bed and initiated coming to edge of bed upon seeing therapist but required min A to bring LLE off bed due to pain. Donned DARCO shoes total A with supervision sitting balance. Patient ambulated from EOB to/from bathroom using RW with min A and unsafe RW management. Patient performed sit <> stand from toilet with min A and total A for clothing and hygiene after small bowel movement. Patient donned pants from wheelchair with assist to thread LLE, patient threading RLE without cues and pulled pants up in standing with min A for balance. From wheelchair level, patient instructed in hand hygiene, brushing teeth, and washing face with setup assist and step-by-step cues for sequencing due to decreased initiation or patient impulsively attempting to brush teeth without water or toothpaste. Patient crying and motioning, indicating desire to return to bed. Patient redirected to task of eating breakfast and taking medications in minimally distracting day room environment with focus on OOB tolerance, command following, and participation in therapeutic tasks. At end of session, patient returned to bed and left in L sidelying with bed alarm on and call  bell in reach, patient pulling covers over head. Patient verbalizing more words today appropriately throughout session, "cold, I'm tired, bye" but continues to moan or cry out due to pain and fatigue.  Therapy Documentation Precautions:  Precautions Precautions: Fall Precaution Comments: bilateral DARCO shoes Restrictions Weight Bearing Restrictions: No LLE Weight Bearing: Weight bearing as tolerated Pain: Pain Assessment Pain Assessment: Faces Faces Pain Scale: Hurts even more Pain Type: Acute pain Pain Location: Leg Pain Orientation: Left Pain Descriptors / Indicators: Aching;Moaning;Grimacing Pain Onset: With Activity Pain Intervention(s): Repositioned;Rest;Distraction, RN made aware   See Function Navigator for Current Functional Status.   Therapy/Group: Individual Therapy  Kerney Elbe 01/19/2016, 8:58 AM

## 2016-01-19 NOTE — Progress Notes (Signed)
Social Work  Social Work Assessment and Plan  Patient Details  Name: [name] [name] MRN: [id number] Date of Birth: 1946-09-07  Today's Date: 01/17/2016  Problem List:  Patient Active Problem List   Diagnosis Date Noted  . TBI (traumatic brain injury) (HCC) 01/16/2016  . Protein-calorie malnutrition, severe 01/14/2016  . SAH (subarachnoid hemorrhage) (HCC)   . Benign essential HTN   . Seizures (HCC) 01/13/2016  . Anticoagulated on Coumadin   . Subdural hematoma (HCC)   . Chronic atrial fibrillation (HCC)   . IDDM (insulin dependent diabetes mellitus) (HCC)   . History of renal transplant   . PVD (peripheral vascular disease) (HCC)   . Ischemic ulcer of both feet (HCC)   . SDH (subdural hematoma) (HCC) 01/07/2016  . Type II diabetes mellitus (HCC) 11/23/2015  . Hypoglycemia 11/23/2015  . Critical lower limb ischemia 11/20/2015  . Encounter for therapeutic drug monitoring 10/06/2013  . Colon cancer screening 06/01/2012  . 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 PRIMARY HYPERPARATHYROIDISM 08/06/2010  . ANEMIA 08/06/2010  . DEPRESSION 08/06/2010  . Mitral stenosis 08/06/2010  . Atrial fibrillation (HCC) 08/06/2010  . 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 DIASTOLIC DYSFUNCTION 08/06/2010  . Cerebral artery occlusion with cerebral infarction (HCC) 08/06/2010  . PVD 08/06/2010  . RENAL FAILURE, END STAGE 08/06/2010  . PACEMAKER, PERMANENT 08/06/2010   Past Medical History:  Past Medical History  Diagnosis Date  . Hypertension   . Stroke [facility]) December 2011    left MCA distribution  . Permanent atrial fibrillation (HCC)     coumadin clinic at [facility]. Has previously required Lovenox->Coumadin bridge.  . Severe mitral stenosis by prior echocardiogram     last echo 07/2012  . Aortic stenosis   . Symptomatic bradycardia     s/p pacer  . Depression   . HSV (herpes simplex virus) infection   . Anemia   . Hyperparathyroidism, primary (HCC)     s/p parathyroidectomy  .  PAD (peripheral artery disease) (HCC)     a. s/p RLE angioplasty with [name]. b. (by [name])  L popliteral artery orbital atherectomy and balloon angioplasty & L SFA orbital atherectomy and balloon angioplasty 11/30/12.  [name] PVD (peripheral vascular disease) (HCC)   . Presence of permanent cardiac pacemaker   . High cholesterol   . CHF (congestive heart failure) (HCC)   . TIA (transient ischemic attack) 08/2009    [name] 09/05/2009  . Atrial thrombus (HCC)     appendage/notes 09/05/2009  . Type II diabetes mellitus (HCC)   . History of blood transfusion     related to "kidney transplant"  . Chronic kidney disease     s/p renal transplant;  follows with [name]  . GERD (gastroesophageal reflux disease)     hx  . History of stomach ulcers   . Diabetic foot ulcer (HCC)     "left is worse than right" (11/20/2015)   Past Surgical History:  Past Surgical History  Procedure Laterality Date  . Insert / replace / remove pacemaker    . Kidney transplant  December 1999  . Abdominal angiogram  11/30/2012    ABDOMINAL AORTIC ANGIOGRAM   DR ARDIA  . Dialysis fistula creation    . Abdominal aortagram N/A 11/30/2012    Procedure: ABDOMINAL [name];  Surgeon: [name], MD;  Location: [facility] CATH LAB;  Service: Cardiovascular;  Laterality: N/A;  . Peripheral vascular catheterization N/A 11/20/2015    Procedure: Abdominal Aortogram;  Surgeon: [name], MD;  Location: [facility] INVASIVE CV LAB;  Service: Cardiovascular;  Laterality: N/A;  . Peripheral vascular catheterization Bilateral 11/20/2015    Procedure: Lower Extremity Angiography;  Surgeon: [name], MD;  Location: [facility] INVASIVE CV LAB;  Service: Cardiovascular;  Laterality: Bilateral;  . Peripheral vascular catheterization  11/20/2015    Procedure: Peripheral Vascular Balloon Angioplasty;  Surgeon: [name], MD;  Location: [facility] INVASIVE CV LAB;  Service: Cardiovascular;;  . Tonsillectomy    . Appendectomy    .  Hernia repair    . Umbilical hernia repair     Social History:  reports that she has quit smoking. Her smoking use included Cigarettes. She has a 2.5 pack-year smoking history. She has never used smokeless tobacco. She reports that she does not drink alcohol or use illicit drugs.  Family / Support Systems Marital Status: Separated How Long?: legally separated x 34 yrs. Patient Roles: Parent, Other (Comment) Spouse/Significant Other: (separated spouse), Icesis Renn who lives with their daughter, Tammy Children: Daughter, Linde Gillis @ (C) [phone] Anticipated Caregiver: ex-husband, daughters, hire help if needed Ability/Limitations of Caregiver: One daughter lives in [place].  Another daughter works locally.  Ex-husband Caregiver Availability: 24/7 Family Dynamics: Daughter reports that they are trying to work together to get the support that her mother needs.  "ex" spouse is willing to move in to assist per daughter.  Social History Preferred language: English Religion: Baptist Cultural Background: NA Read: Yes Write: Yes Employment Status: Disabled Date Retired/Disabled/Unemployed: since 1990 Legal Hisotry/Current Legal Issues: None Guardian/Conservator: None   Abuse/Neglect Physical Abuse: Denies Verbal Abuse: Denies Sexual Abuse: Denies Exploitation of patient/patient's resources: Denies Self-Neglect: Denies  Emotional Status Pt's affect, behavior adn adjustment status: Pt with significant cognitive and communication deficits.  Very frail woman lying in bed and does smile when I introduce myself.  She says one word at times, but sometimes it is merely a repeat of my word.  Cannot evaluate her emotional response at this level of functioning.  Will monitor. Recent Psychosocial Issues: Chronic health issues for 30 yrs per daughter.   Pyschiatric History: No formal mental health diagnosis, however, daughter states, "I just have to imagine that she has been depressed about her  health issues all along." Substance Abuse History: None  Patient / Family Perceptions, Expectations & Goals Pt/Family understanding of illness & functional limitations: Cannot assess pt's level of awareness.  Daughter with basic understanding that she did suffer a TBI and pelvic injuries, however, mostly focused on what the plan for pt's foot/ leg may be. Premorbid pt/family roles/activities: declining safety the past few months.  Had been independent but not very clear how safe this was. Anticipated changes in roles/activities/participation: Pt has supervision goals.  anticipate that ex-spouse will assume caregiver roles. Pt/family expectations/goals: Daughter hopeful that she can reach a supervision level and also that she transfer from CIR straight to acute for surgery on foot/leg.  Community Resources [employer]: None Transportation available at discharge: yes Resource referrals recommended: Neuropsychology  Discharge Planning Living Arrangements: Alone Support Systems: Children, Other (Comment) ("ex" spouse) Type of Residence: Private residence Insurance Resources: [employer], Media planner (specify) Herbalist) Financial Resources: [employer] Financial Screen Referred: No Living Expenses: Psychologist, sport and exercise Management: Patient Does the patient have any problems obtaining your medications?: No Home Management: pt Patient/Family Preliminary Plans: Daughter reports plan is for pt to d/c to her own apartment and "ex" spouse to stay with her there. Social Work Anticipated Follow Up Needs: HH/OP Expected length of stay: 12-14 days  Clinical Impression Very frail, elderly woman here after a fall in her home and suffering a TBI, pelvic fx and ongoing issues with her toes that will require amputation at some point.  Pt was, surprisingly, living alone and family was checking in on her.  Declining function over past several months.  Daughter, [name], aware that pt will not be able to live alone now  with further physical and cognitive deficits and she is arranging needed assist.  Will follow for support and d/c planning needs.  Heidi Maclin 01/17/2016, 5:40 PM

## 2016-01-19 NOTE — Progress Notes (Signed)
[place] PHYSICAL MEDICINE & REHABILITATION     PROGRESS NOTE    Subjective/Complaints: Comfortable this morning. Sugar much better this am.  ROS: limited due to aphasia/cognition  Objective: Vital Signs: Blood pressure 125/76, pulse 76, temperature 98.1 F (36.7 C), temperature source Oral, resp. rate 20, height 4\' 11"  (1.499 m), weight 33.7 kg (74 lb 4.7 oz), SpO2 100 %. No results found.  Recent Labs  01/17/16 0606  WBC 13.6*  HGB 13.7  HCT 41.9  PLT 158    Recent Labs  01/17/16 0606 01/18/16 0651  NA 140 141  K 2.9* 3.2*  CL 97* 100*  GLUCOSE 50* 45*  BUN 21* 28*  CREATININE 0.82 0.97  CALCIUM 8.0* 8.0*   CBG (last 3)   Recent Labs  01/18/16 1653 01/18/16 2053 01/19/16 0718  GLUCAP 192* 165* 135*    Wt Readings from Last 3 Encounters:  01/19/16 33.7 kg (74 lb 4.7 oz)  01/07/16 36.2 kg (79 lb 12.9 oz)  12/24/15 41.912 kg (92 lb 6.4 oz)    Physical Exam:  Constitutional: She appears well-developed. She appears cachectic. She is sleeping. She is easily aroused. She has a sickly appearance. No distress.  HENT:  Head: Normocephalic and atraumatic.  Mouth/Throat: Abnormal dentition.  Teeth with crusted material  Eyes: Conjunctivae and EOM are normal. Pupils are equal, round, and reactive to light.  Neck: Normal range of motion. Neck supple.  Cardiovascular: An irregularly irregular rhythm present.  Murmur heard. Respiratory: Effort normal and breath sounds normal. No respiratory distress. She has no wheezes.  GI: Soft. Bowel sounds are normal. She exhibits no distension. There is no tenderness.  Musculoskeletal: She exhibits tenderness. She exhibits no edema.  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 Right 1st and 2nd toes ischemic appearing with early blister--painted with betadine.  Pain with ROM left hip    Neurological: She is alert and easily aroused.  Globally  aphasic--verbal output limited to yes generally.     Skin: Skin is warm and dry. She is not diaphoretic.  Psychiatric: Her affect is inappropriate. She is slowed.  She expresses inappropriate judgment.         Assessment/Plan: 1. Aphasia, cognitive, and functional deficits  secondary to TBI which require 3+ hours per day of interdisciplinary therapy in a comprehensive inpatient rehab setting. Physiatrist is providing close team supervision and 24 hour management of active medical problems listed below. Physiatrist and rehab team continue to assess barriers to discharge/monitor patient progress toward functional and medical goals.  Function:  Bathing Bathing position Bathing activity did not occur: Safety/medical concerns Position: Shower  Bathing parts Body parts bathed by patient: Right arm, Left arm, Chest, Abdomen, Front perineal area, Right upper leg, Left upper leg Body parts bathed by helper: Right lower leg, Left lower leg, Back, Buttocks  Bathing assist Assist Level: Touching or steadying assistance(Pt > 75%)      Upper Body Dressing/Undressing Upper body dressing Upper body dressing/undressing activity did not occur: Safety/medical concerns What is the patient wearing?: Pull over shirt/dress     Pull over shirt/dress - Perfomed by patient: Thread/unthread right sleeve, Thread/unthread left sleeve, Put head through opening Pull over shirt/dress - Perfomed by helper: Pull shirt over trunk        Upper body assist Assist Level: Touching or steadying assistance(Pt > 75%)      Lower Body Dressing/Undressing Lower body dressing   What is the patient wearing?:  Pants, Non-skid slipper socks, Shoes     Pants- Performed by patient: Thread/unthread right pants leg, Pull pants up/down Pants- Performed by helper: Thread/unthread left pants leg   Non-skid slipper socks- Performed by helper: Don/doff right sock, Don/doff left sock       Shoes - Performed by helper: Don/doff  right shoe, Don/doff left shoe, Fasten right, Fasten left          Lower body assist Assist for lower body dressing: Touching or steadying assistance (Pt > 75%)      Toileting Toileting Toileting activity did not occur: Safety/medical concerns Toileting steps completed by patient: Adjust clothing prior to toileting, Performs perineal hygiene Toileting steps completed by helper: Adjust clothing after toileting, Adjust clothing prior to toileting, Performs perineal hygiene Toileting Assistive Devices: Other (comment) (RW)  Toileting assist Assist level: Touching or steadying assistance (Pt.75%)   Transfers Chair/bed transfer   Chair/bed transfer method: Stand pivot, Ambulatory Chair/bed transfer assist level: Touching or steadying assistance (Pt > 75%) Chair/bed transfer assistive device: Walker, Designer, fashion/clothing     Max distance: 20 Assist level: Touching or steadying assistance (Pt > 75%)   Wheelchair   Type: Manual Max wheelchair distance: 10 Assist Level: Dependent (Pt equals 0%)  Cognition Comprehension Comprehension assist level: Understands basic 25 - 49% of the time/ requires cueing 50 - 75% of the time  Expression Expression assist level: Expresses basis less than 25% of the time/requires cueing >75% of the time.  Social Interaction Social Interaction assist level: Interacts appropriately 25 - 49% of time - Needs frequent redirection.  Problem Solving Problem solving assist level: Solves basic less than 25% of the time - needs direction nearly all the time or does not effectively solve problems and may need a restraint for safety  Memory Memory assist level: Recognizes or recalls less than 25% of the time/requires cueing greater than 75% of the time   Medical Problem List and Plan: 1. Aphasia, gait disorder, cognitive deficits secondary to Left subdural hematoma, left superior inferior pubic ramus fracture status post fall  -continue CIR  therapies 2. DVT Prophylaxis/Anticoagulation: Mechanical: Sequential compression devices, below knee Bilateral lower extremities 3. Pain Management: comfortable at present  -follow as she works with therapy 4. Mood: LCSW to follow for evaluation and support.  5. Neuropsych: This patient is not capable of making decisions on her own behalf.  6. Skin/Wound Care: Prevalon boots bilateral feet--elevate when in bed chair. Needs to have shoes on when out of bed.  7. Fluids/Electrolytes/Nutrition: Monitor I/O. Needs assistance with meals.   -replacing potassium---recheck in AM 8. A fib with severe mitral stenosis and symptomatic AS:  Monitor for CP or orthostatic symptoms.  9. DM type 2: monitor BS ac/hs.  Decreased lantus to 5u qhs due to hypoglycemia and inadequate intake.  -cbg 135 this am  10. Malnutrition:  protein supplement/encourage po  -increasing arousal should help 11. CKD s/p renal transplant: ON cyclosporine, prednisone and cellcept.  12. PAD with claudication and gangrenous change left toes: conservative care for now---will need amputation at some point 13. HTN: Monitor BP bid. Continue norvasc 14. New onset seizures: On keppra 1000 mg bid---reduced to  bid due to lethargy     LOS (Days) 3 A FACE TO FACE EVALUATION WAS PERFORMED  [name] [name] T 01/19/2016 8:50 AM

## 2016-01-20 ENCOUNTER — Inpatient Hospital Stay (HOSPITAL_COMMUNITY): Payer: Medicare Other

## 2016-01-20 ENCOUNTER — Inpatient Hospital Stay (HOSPITAL_COMMUNITY): Payer: Medicare Other | Admitting: Speech Pathology

## 2016-01-20 ENCOUNTER — Inpatient Hospital Stay (HOSPITAL_COMMUNITY): Payer: Medicare Other | Admitting: Occupational Therapy

## 2016-01-20 ENCOUNTER — Inpatient Hospital Stay (HOSPITAL_COMMUNITY): Payer: Medicare Other | Admitting: Physical Therapy

## 2016-01-20 DIAGNOSIS — Z418 Encounter for other procedures for purposes other than remedying health state: Secondary | ICD-10-CM

## 2016-01-20 DIAGNOSIS — E1165 Type 2 diabetes mellitus with hyperglycemia: Secondary | ICD-10-CM

## 2016-01-20 DIAGNOSIS — R7989 Other specified abnormal findings of blood chemistry: Secondary | ICD-10-CM

## 2016-01-20 LAB — GLUCOSE, CAPILLARY
GLUCOSE-CAPILLARY: 136 mg/dL — AB (ref 65–99)
GLUCOSE-CAPILLARY: 232 mg/dL — AB (ref 65–99)
GLUCOSE-CAPILLARY: 214 mg/dL — AB (ref 65–99)
Glucose-Capillary: 269 mg/dL — ABNORMAL HIGH (ref 65–99)

## 2016-01-20 LAB — BASIC METABOLIC PANEL
ANION GAP: 10 (ref 5–15)
BUN: 26 mg/dL — ABNORMAL HIGH (ref 6–20)
CO2: 25 mmol/L (ref 22–32)
Calcium: 8.7 mg/dL — ABNORMAL LOW (ref 8.9–10.3)
Chloride: 105 mmol/L (ref 101–111)
Creatinine, Ser: 0.88 mg/dL (ref 0.44–1.00)
Glucose, Bld: 128 mg/dL — ABNORMAL HIGH (ref 65–99)
POTASSIUM: 5.1 mmol/L (ref 3.5–5.1)
SODIUM: 140 mmol/L (ref 135–145)

## 2016-01-20 MED ORDER — INSULIN GLARGINE 100 UNIT/ML ~~LOC~~ SOLN
5.0000 [IU] | Freq: Every day | SUBCUTANEOUS | Status: DC
  Administered 2016-01-20 – 2016-01-22 (×3): 5 [IU] via SUBCUTANEOUS
  Filled 2016-01-20 (×4): qty 0.05

## 2016-01-20 MED ORDER — POTASSIUM CHLORIDE 20 MEQ/15ML (10%) PO SOLN
20.0000 meq | Freq: Every day | ORAL | Status: DC
  Administered 2016-01-21 – 2016-01-24 (×4): 20 meq via ORAL
  Filled 2016-01-20 (×4): qty 15

## 2016-01-20 MED ORDER — ACETAMINOPHEN 500 MG PO TABS
500.0000 mg | ORAL_TABLET | Freq: Three times a day (TID) | ORAL | Status: DC
Start: 2016-01-20 — End: 2016-01-30
  Administered 2016-01-20 – 2016-01-30 (×40): 500 mg via ORAL
  Filled 2016-01-20 (×41): qty 1

## 2016-01-20 NOTE — Progress Notes (Signed)
Ranelle Oyster, MD Physician Signed Physical Medicine and Rehabilitation Consult Note 01/10/2016 9:40 AM  Related encounter: ED to Hosp-Admission (Discharged) from 01/07/2016 in [facility]    Expand All Collapse All        Physical Medicine and Rehabilitation Consult  Reason for Consult: TBI and left pelvic fractures Referring Physician: Dr. Lindie Spruce.    HPI: [name] [name] is a 70 y.o. female with history fo HTN, CVA, severe MS and symptomatic AS, T2DM, CKD, PAF-on coumadin, gait disorder, PAD with severe claudication LLE and bilateral foot ulcers (with plans for partial foot amputation next week) who was admitted on 01/07/16 due to fall out of bed--she struck her head and EMS called but patient refused to go to the hospital till later that evening due to inability to get out of bed. She was noted to have multiple contusion on right side and Minimally displaced left superior and inferior pubic rami with possible extension into left acetabulum. CT head showed moderate left frontoparietal SDH with 3 mm L to R shift and small SAH in left parietal lobe. She was evaluated by Dr. Jordan Likes who recommended reversal of coumadin and serial CT for monitoring. Dr. Renaye Rakers recommended WBAT on LLE. Follow up CCT showed ncrease in bleed and edema and she did have worsening of expressive aphasia as well question focal motor seizure activity. CT head today showing decrease in bifrontal acute SDH, stable left frontal parenchymal hematoma and no residual midline shift. PT evaluation done yesterday and CIR recommended for follow up therapy.    Review of Systems  Unable to perform ROS: language      Past Medical History  Diagnosis Date  . Hypertension   . Stroke [facility]) December 2011    left MCA distribution  . Permanent atrial fibrillation (HCC)     coumadin clinic at [facility]. Has previously required Lovenox->Coumadin bridge.  .  Severe mitral stenosis by prior echocardiogram     last echo 07/2012  . Aortic stenosis   . Symptomatic bradycardia     s/p pacer  . Depression   . HSV (herpes simplex virus) infection   . Anemia   . Hyperparathyroidism, primary (HCC)     s/p parathyroidectomy  . PAD (peripheral artery disease) (HCC)     a. s/p RLE angioplasty with [name]. b. (by [name]) L popliteral artery orbital atherectomy and balloon angioplasty & L SFA orbital atherectomy and balloon angioplasty 11/30/12.  [name] PVD (peripheral vascular disease) (HCC)   . Presence of permanent cardiac pacemaker   . High cholesterol   . CHF (congestive heart failure) (HCC)   . TIA (transient ischemic attack) 08/2009    [name] 09/05/2009  . Atrial thrombus (HCC)     appendage/notes 09/05/2009  . Type II diabetes mellitus (HCC)   . History of blood transfusion     related to "kidney transplant"  . Chronic kidney disease     s/p renal transplant; follows with [name]  . GERD (gastroesophageal reflux disease)     hx  . History of stomach ulcers   . Diabetic foot ulcer (HCC)     "left is worse than right" (11/20/2015)    Past Surgical History  Procedure Laterality Date  . Insert / replace / remove pacemaker    . Kidney transplant  December 1999  . Abdominal angiogram  11/30/2012    ABDOMINAL AORTIC ANGIOGRAM DR ARDIA  . Dialysis fistula creation    .  Abdominal aortagram N/A 11/30/2012    Procedure: ABDOMINAL [name]; Surgeon: [name], MD; Location: [facility] CATH LAB; Service: Cardiovascular; Laterality: N/A;  . Peripheral vascular catheterization N/A 11/20/2015    Procedure: Abdominal Aortogram; Surgeon: [name], MD; Location: [facility] INVASIVE CV LAB; Service: Cardiovascular; Laterality: N/A;  . Peripheral vascular catheterization Bilateral 11/20/2015     Procedure: Lower Extremity Angiography; Surgeon: [name], MD; Location: [facility] INVASIVE CV LAB; Service: Cardiovascular; Laterality: Bilateral;  . Peripheral vascular catheterization  11/20/2015    Procedure: Peripheral Vascular Balloon Angioplasty; Surgeon: [name], MD; Location: [facility] INVASIVE CV LAB; Service: Cardiovascular;;  . Tonsillectomy    . Appendectomy    . Hernia repair    . Umbilical hernia repair      Family History  Problem Relation Age of Onset  . Adopted: Yes  . Kidney disease Brother     died from  . Kidney disease Sister     died from  . Heart attack Neg Hx   . Stroke Neg Hx     Social History: Lives alone. Per reports that she has quit smoking. Her smoking use included Cigarettes. She has a 2.5 pack-year smoking history. She has never used smokeless tobacco. Per reports that she does not drink alcohol or use illicit drugs.   Allergies  Allergen Reactions  . Bactrim [Sulfamethoxazole-Trimethoprim] Itching  . Aspirin Nausea Only    GI upset  . Shellfish Allergy Hives    Also nausea  . Codeine Hives and Nausea Only  . Latex Rash  . Penicillins Nausea And Vomiting and Rash    Has patient had a PCN reaction causing immediate rash, facial/tongue/throat swelling, SOB or lightheadedness with hypotension:No Has patient had a PCN reaction causing severe rash involving mucus membranes or skin necrosis:No Has patient had a PCN reaction that required hospitalization:No Has patient had a PCN reaction occurring within the last 10 years:cannot recall If all of the above answers are "NO", then may proceed with Cephalosporin use.   . Tape Itching and Rash    Please use "paper" tape    Medications Prior to Admission  Medication Sig Dispense Refill  . acetaminophen (TYLENOL) 500 MG tablet Take 500 mg by mouth every 6 (six) hours as needed. For pain      . amLODipine (NORVASC) 5 MG tablet Take 1 tablet (5 mg total) by mouth daily. 90 tablet 0  . azelastine (OPTIVAR) 0.05 % ophthalmic solution Place 1 drop into both eyes 2 (two) times daily as needed (for dry eyes).     [name] b complex-vitamin c-folic acid (NEPHRO-VITE) 0.8 MG TABS Take 1 tablet by mouth daily.     . Calcium Carbonate-Vitamin D (CALTRATE 600+D) 600-400 MG-UNIT per tablet Take 1 tablet by mouth 2 (two) times daily.     . clopidogrel (PLAVIX) 75 MG tablet Take 1 tablet (75 mg total) by mouth daily. 30 tablet 11  . cycloSPORINE modified (NEORAL/GENGRAF) 25 MG capsule Take 25 mg by mouth 2 (two) times daily.     . digoxin (LANOXIN) 0.125 MG tablet Take 0.125 mg by mouth daily.     . ergocalciferol (VITAMIN D2) 50000 UNITS capsule Take 50,000 Units by mouth every 30 (thirty) days. First of the month    . furosemide (LASIX) 20 MG tablet TAKE 1 TABLET BY MOUTH EVERY OTHER DAY 30 tablet 0  . iron polysaccharides (NIFEREX) 150 MG capsule Take 150 mg by mouth daily.     [name] JANUVIA 50 MG tablet Take  1 tablet by mouth 2 (two) times daily.     [name] LEVEMIR FLEXTOUCH 100 UNIT/ML Pen Inject 10 Units as directed at bedtime.     . lidocaine (XYLOCAINE) 5 % ointment Apply 1 application topically as needed. For foot pain 35.44 g 0  . metoprolol (LOPRESSOR) 100 MG tablet TAKE 1 TABLET BY MOUTH TWICE A DAY 60 tablet 6  . Multiple Vitamins-Minerals (ONE-A-DAY WOMENS 50 PLUS PO) Take 1 tablet by mouth daily.    . mycophenolate (CELLCEPT) 250 MG capsule Take 500 mg by mouth 2 (two) times daily.     . ONE TOUCH ULTRA TEST test strip TEST BLOOD SUGAR 3 TIMES A DAY (E11.9)  3  . pravastatin (PRAVACHOL) 20 MG tablet Take 1 tablet (20 mg total) by mouth every evening. 90 tablet 3  . predniSONE (DELTASONE) 5 MG tablet Take 5 mg by mouth daily.     . traMADol (ULTRAM) 50 MG tablet Take 1 tablet (50 mg total) by mouth  every 8 (eight) hours as needed for moderate pain. 60 tablet 0  . valACYclovir (VALTREX) 500 MG tablet Take 500 mg by mouth every Monday, Wednesday, and Friday. Only on Monday, Wednesday and Friday    . warfarin (COUMADIN) 2.5 MG tablet 1 Tab daily M,W,Th,F,Sun; 1/2 tab daily Tues/Sat. (Patient taking differently: Take 2.5 mg by mouth every afternoon on Sun/Mon/Wed/Thurs/Fri and 1.25 mg on Tues/Sat) 30 tablet 3    Home: Home Living Family/patient expects to be discharged to:: Private residence Living Arrangements: Alone Available Help at Discharge: Family, Available PRN/intermittently Type of Home: House Home Access: Level entry Home Layout: One level Bathroom Toilet: Standard Bathroom Accessibility: No Home Equipment: Environmental consultant - 2 wheels, Walker - 4 wheels, Cane - quad Additional Comments: History from previous admission notes, not sure if still accurate.   Functional History: Prior Function Level of Independence: Independent with assistive device(s) Comments: used rollator mainly for gait (from previous admission note) Functional Status:  Mobility: Bed Mobility Overal bed mobility: Needs Assistance Bed Mobility: Supine to Sit, Sit to Supine Supine to sit: Min guard Sit to supine: Min guard General bed mobility comments: Min guard assist to manually facilitate moving EOB. Pt sat up quickly and returned to supine quickly.  Transfers General transfer comment: NT as pt's HR ranged from 112 to 175 max observed and would go up and down in A-fib throughout just going EOB.       ADL:    Cognition: Cognition Overall Cognitive Status: Impaired/Different from baseline Orientation Level: Other (comment) (expressive aphasia) Cognition Arousal/Alertness: Awake/alert Behavior During Therapy: Restless Overall Cognitive Status: Impaired/Different from baseline Area of Impairment: Following commands Following Commands: Follows one step commands inconsistently General  Comments: Pt followed some, but not all commands, did respond to manual cues Difficult to assess due to: Impaired communication   Blood pressure 105/81, pulse 63, temperature 97.6 F (36.4 C), temperature source Oral, resp. rate 26, height 5\' 2"  (1.575 m), weight 36.2 kg (79 lb 12.9 oz), SpO2 98 %. Physical Exam  Nursing note and vitals reviewed. Constitutional: She appears well-developed. She appears cachectic.  Moaning with fluttering movements of eye lids and lips.  HENT:  Head: Normocephalic and atraumatic.  Eyes: Conjunctivae are normal. Pupils are equal, round, and reactive to light.  Neck: Normal range of motion. Neck supple.  Cardiovascular: An irregular rhythm present. Tachycardia present.  Murmur heard. Heart rate in 130- 180 at times.  Respiratory: Effort normal and breath sounds normal. No respiratory distress. She has no  wheezes.  GI: Soft. Bowel sounds are normal. She exhibits no distension. There is no tenderness.  Musculoskeletal: She exhibits no edema.  Dry gangrene multiple toes on left foot with purple fluctuant blister left heel.  Neurological: She is alert. A cranial nerve deficit is present.  Does not blink to threat in right lateral field and tends to stare straight ahead. Anxious and moaning with puffing movements of lips. Unable to state name. She was able to follow occasional simple motor commands with tactile cues. Noted to have global aphasia.  Skin: Skin is warm and dry.  Psychiatric: Her mood appears anxious. She is agitated. Cognition and memory are impaired. She is inattentive.     Lab Results Last 24 Hours    Results for orders placed or performed during the hospital encounter of 01/07/16 (from the past 24 hour(s))  Glucose, capillary Status: Abnormal   Collection Time: 01/09/16 11:55 AM  Result Value Ref Range   Glucose-Capillary 229 (H) 65 - 99 mg/dL  Glucose, capillary Status: Abnormal   Collection Time: 01/09/16 3:30  PM  Result Value Ref Range   Glucose-Capillary 278 (H) 65 - 99 mg/dL  Glucose, capillary Status: None   Collection Time: 01/09/16 7:20 PM  Result Value Ref Range   Glucose-Capillary 72 65 - 99 mg/dL  Glucose, capillary Status: Abnormal   Collection Time: 01/09/16 11:19 PM  Result Value Ref Range   Glucose-Capillary 151 (H) 65 - 99 mg/dL  Protime-INR Status: Abnormal   Collection Time: 01/10/16 3:00 AM  Result Value Ref Range   Prothrombin Time 16.8 (H) 11.6 - 15.2 seconds   INR 1.36 0.00 - 1.49  CBC Status: Abnormal   Collection Time: 01/10/16 3:00 AM  Result Value Ref Range   WBC 16.2 (H) 4.0 - 10.5 K/uL   RBC 3.41 (L) 3.87 - 5.11 MIL/uL   Hemoglobin 10.8 (L) 12.0 - 15.0 g/dL   HCT 16.1 (L) 09.6 - 04.5 %   MCV 99.4 78.0 - 100.0 fL   MCH 31.7 26.0 - 34.0 pg   MCHC 31.9 30.0 - 36.0 g/dL   RDW 40.9 81.1 - 91.4 %   Platelets 209 150 - 400 K/uL  Basic metabolic panel Status: Abnormal   Collection Time: 01/10/16 3:00 AM  Result Value Ref Range   Sodium 142 135 - 145 mmol/L   Potassium 4.0 3.5 - 5.1 mmol/L   Chloride 110 101 - 111 mmol/L   CO2 20 (L) 22 - 32 mmol/L   Glucose, Bld 118 (H) 65 - 99 mg/dL   BUN 21 (H) 6 - 20 mg/dL   Creatinine, Ser 7.82 (H) 0.44 - 1.00 mg/dL   Calcium 8.4 (L) 8.9 - 10.3 mg/dL   GFR calc non Af Amer 52 (L) >60 mL/min   GFR calc Af Amer >60 >60 mL/min   Anion gap 12 5 - 15  Glucose, capillary Status: Abnormal   Collection Time: 01/10/16 3:12 AM  Result Value Ref Range   Glucose-Capillary 133 (H) 65 - 99 mg/dL  Glucose, capillary Status: Abnormal   Collection Time: 01/10/16 8:19 AM  Result Value Ref Range   Glucose-Capillary 105 (H) 65 - 99 mg/dL      Imaging Results (Last 48 hours)    Ct Head Wo Contrast  01/10/2016 CLINICAL DATA: Followup subdural  hematoma. History of TIA and diabetes, kidney disease. EXAM: CT HEAD WITHOUT CONTRAST TECHNIQUE: Contiguous axial images were obtained from the base of the skull through the vertex without intravenous contrast. COMPARISON: CT  HEAD Jan 07, 2016 FINDINGS: INTRACRANIAL CONTENTS: Similar 23 x 14 mm LEFT frontal intraparenchymal hematoma with surrounding low-density vasogenic edema. Decreasing LEFT subarachnoid hemorrhage and subdural hematoma: Previously measuring up to 12 mm, now 6 mm. No midline shift. Trace probable residual subdural and hematoma at jugular foramen. Trace, decreased RIGHT frontal subdural hematoma. Small area RIGHT parietal encephalomalacia again noted. No acute large vascular territory infarcts. Ventricles and sulci are overall normal for patient's age. Marked dural calcifications. Basal cisterns are patent. ORBITS: The included ocular globes and orbital contents are non-suspicious. SINUSES: The mastoid aircells and included paranasal sinuses are well-aerated. SKULL/SOFT TISSUES: Small LEFT frontal scalp hematoma. No skull fracture. 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 Extensive scalp calcifications consistent with history of kidney disease. IMPRESSION: Decreasing bifrontal acute subdural hematomas, without significant mass effect and no residual midline shift. Decreasing LEFT subarachnoid hemorrhage. Minimal probable subdural hematoma at skull base. Stable appearance of LEFT frontal intraparenchymal hematoma. RIGHT parietal encephalomalacia most consistent with old RIGHT MCA territory infarct. Electronically Signed By: Awilda Metro M.D. On: 01/10/2016 05:53   Ct Pelvis Wo Contrast  01/08/2016 CLINICAL DATA: Status post fall today with onset of left groin pain. Initial encounter. EXAM: CT PELVIS WITHOUT CONTRAST TECHNIQUE: Multidetector CT imaging of the pelvis was performed following the standard  protocol without intravenous contrast. COMPARISON: Plain films left hip this same day. FINDINGS: As seen on the comparison plain films, the patient has a left parasymphyseal pubic bone fracture which extends just into the left superior pubic ramus. No other fracture is identified. Specifically, there is no acetabular fracture as questioned on plain films. Bones are markedly osteopenic. The hips are located. Visualized lower lumbar spine is unremarkable. Imaged intrapelvic contents demonstrate extensive atherosclerosis. The patient has a pelvic right kidney. Multiple small hyperintense lesions in the kidney cannot be definitively characterized but likely represent complex cysts. IMPRESSION: Left parasymphyseal pubic bone fracture extends just into the left superior pubic ramus fracture. No other acute abnormality is identified. Specifically, there is no acetabular fracture. Osteopenia. Atherosclerosis. Pelvic right kidney with small hyperintense lesions within it likely representing complex cysts. Electronically Signed By: Drusilla Kanner M.D. On: 01/08/2016 14:44     Assessment/Plan: Diagnosis: TBI/pelvic injuries after fall from bed. Hx of MS 1. Does the need for close, 24 hr/day medical supervision in concert with the patient's rehab needs make it unreasonable for this patient to be served in a less intensive setting? Yes 2. Co-Morbidities requiring supervision/potential complications: DM, anemia, afib 3. Due to bladder management, bowel management, safety, skin/wound care, disease management, medication administration, pain management and patient education, does the patient require 24 hr/day rehab nursing? Yes 4. Does the patient require coordinated care of a physician, rehab nurse, PT (1-2 hrs/day, 5 days/week), OT (1-2 hrs/day, 5 days/week) and SLP (1-2 hrs/day, 5 days/week) to address physical and functional deficits in the context of the above medical diagnosis(es)? Potentially Addressing  deficits in the following areas: balance, endurance, locomotion, strength, transferring, bowel/bladder control, bathing, dressing, feeding, grooming, toileting, cognition, speech and psychosocial support 5. Can the patient actively participate in an intensive therapy program of at least 3 hrs of therapy per day at least 5 days per week? Potentially 6. The potential for patient to make measurable gains while on inpatient rehab is good 7. Anticipated functional outcomes upon discharge from inpatient rehab are supervision with PT, supervision with OT, supervision with SLP. 8. Estimated rehab length of stay to reach the  above functional goals is: potentially 9-14 days 9. Does the patient have adequate social supports and living environment to accommodate these discharge functional goals? Potentially 10. Anticipated D/C setting: Home 11. Anticipated post D/C treatments: HH therapy 12. Overall Rehab/Functional Prognosis: good and fair  RECOMMENDATIONS: This patient's condition is appropriate for continued rehabilitative care in the following setting: CIR Patient has agreed to participate in recommended program. Potentially Note that insurance prior authorization may be required for reimbursement for recommended care.  Comment: I have concerns about the activity tolerance of this frail woman with MS. Rehab Admissions Coordinator to follow up.  Thanks,  Ranelle Oyster, MD, [place] [facility]     01/10/2016       Revision History     Date/Time User Provider Type Action   01/10/2016 11:29 AM Ranelle Oyster, MD Physician Sign   01/10/2016 10:22 AM Jacquelynn Cree, PA-C Physician Assistant Share   View Details Report       Routing History     Date/Time From To Method   01/10/2016 11:29 AM Ranelle Oyster, MD Marva Panda, NP Fax

## 2016-01-20 NOTE — Progress Notes (Signed)
Occupational Therapy Session Note  Patient Details  Name: [name] [name] MRN: [id number] Date of Birth: Sep 02, 1946  Today's Date: 01/20/2016 OT Individual Time: 1300-1400 OT Individual Time Calculation (min): 60 min    Short Term Goals: Week 1:  OT Short Term Goal 1 (Week 1): Pt will don shirt with min A. OT Short Term Goal 2 (Week 1): Pt will don pants with mod A. OT Short Term Goal 3 (Week 1): Pt will transfer into shower with min A. OT Short Term Goal 4 (Week 1): Pt will be able to toilet with min A.  Skilled Therapeutic Interventions/Progress Updates:    Pt asleep in bed upon arrival but easily aroused.  Pt grimaced and "cried out" in pain when therapist attempted to assist pt to sitting EOB.  Pt attempted to sit EOB unassisted but required mod A for sitting EOB.  Pt declined bathing, after numerous attempts by therapist to engage in activity.  Pt did change clothing when presented with clean clothing.  Pt initially attempted to don clean clothing on top of her clothes.  Pt easily redirected to performing task appropriately.  Pt incontinent of bladder and amb with RW to bathroom to attempt use of toilet.  Pt performed hygiene with steady A but required assistance with clothing management.  Pt returned to EOB to complete dressing tasks.  Pt grimaced each occurrence of touching her R foot when doffing/donning pants.  Attempted to engaged pt in grooming tasks at sink.  Pt returned to bed with alarm activated and all needs within reach.  Focus on activity tolerance, sitting balance, standing balance, sit<>stand, functional amb with RW, task initiation, sequencing, following one step commands, and safety awareness to increase independence with BADLS.   Therapy Documentation Precautions:  Precautions Precautions: Fall Precaution Comments: bilateral DARCO shoes Restrictions Weight Bearing Restrictions: No LLE Weight Bearing: Weight bearing as tolerated Pain: Pain Assessment Pain  Assessment: Faces Faces Pain Scale: Hurts whole lot Pain Type: Acute pain Pain Location: Foot Pain Orientation: Right Pain Descriptors / Indicators: Crying;Grimacing Pain Frequency: Constant Pain Onset: Unable to tell Patients Stated Pain Goal: 3 Pain Intervention(s): RN made aware;Repositioned ADL: ADL ADL Comments: refer to functional navigator  See Function Navigator for Current Functional Status.   Therapy/Group: Individual Therapy  Rich Brave 01/20/2016, 2:58 PM

## 2016-01-20 NOTE — Progress Notes (Signed)
Recreational Therapy Session Note  Patient Details  Name: [name] [name] MRN: [id number] Date of Birth: [birth year] Today's Date: 01/20/2016   Order received and chart reviewed.  Per chart review & team discussion, pt not yet appropriate for TR services.  Will continue to monitor through team for future participation.  Gurnoor Sloop 01/20/2016, 3:10 PM

## 2016-01-20 NOTE — Progress Notes (Signed)
Speech Language Pathology Daily Session Notes  Patient Details  Name: [name] [name] MRN: [id number] Date of Birth: 02/26/1946  Today's Date: 01/20/2016  Session 1 SLP Individual Time: 1500-1530 SLP Individual Time Calculation (min): 30 min Session 2 SLP Individual Time: 7902-4097 SLP Individual Time Calculation (min): 27 min   Short Term Goals: Week 1: SLP Short Term Goal 1 (Week 1): Pt to communicate basic needs via any means with mod A. SLP Short Term Goal 2 (Week 1): Pt able to 1-step directives with mod A. SLP Short Term Goal 3 (Week 1): Pt able to produce single words with mod A for compensatory strategies- semantic cueing, sentence completion, etc. SLP Short Term Goal 4 (Week 1): Pt able to maintain attention to task for 5 minutes with mod A. SLP Short Term Goal 5 (Week 1): Pt able to demonstrate yes/no response with 70% acc with max A.  Skilled Therapeutic Interventions: Session 1 Skilled treatment session focused on addressing communication goals. Patient initially labile but was easily redirectable.  SLP facilitated session by providing Max assist sentence completion and semantic cues to name familiar objects.  SLP also facilitated session with Mod assist verbal and visual cues to identify named object from a field of 2 with just better than chance level accuracy.  Continue with current plan of care.   Session 2 Skilled treatment session focused on addressing same goals and tasks as earlier session.  Patient named 4/15 objects independently and required sentence completion and semantic cues to achieve 15/15 accuracy.  Patient continued to require Mod assist verbal and visual cues to identify named object from a filed of 2 with 53% accuracy.  Patient utilized multimodal communication to express strong desire to return to bed with extra blankets.  Continue with current plan of care.   Function:  Cognition Comprehension Comprehension assist level: Understands basic 25 - 49% of  the time/ requires cueing 50 - 75% of the time  Expression   Expression assist level: Expresses basis less than 25% of the time/requires cueing >75% of the time.  Social Interaction Social Interaction assist level: Interacts appropriately 25 - 49% of time - Needs frequent redirection.  Problem Solving Problem solving assist level: Solves basic less than 25% of the time - needs direction nearly all the time or does not effectively solve problems and may need a restraint for safety  Memory Memory assist level: Recognizes or recalls less than 25% of the time/requires cueing greater than 75% of the time    Pain: Same for session 1 and 2 Pain Assessment Pain Assessment: Faces Faces Pain Scale: Hurts whole lot Pain Type: Acute pain Pain Location: Foot Pain Orientation: Right Pain Descriptors / Indicators: Crying;Grimacing Pain Frequency: Constant Pain Onset: Unable to tell Patients Stated Pain Goal: 3 Pain Intervention(s): RN made aware;Repositioned Multiple Pain Sites: No  Therapy/Group: Individual Therapy x2  Charlane Ferretti., CCC-SLP [phone]  [name] [name] 01/20/2016, 4:34 PM

## 2016-01-20 NOTE — Progress Notes (Addendum)
[facility]     PROGRESS NOTE    Subjective/Complaints: Up in bed eating breakfast this morning, smiling and waving at me as I came in!!!!  ROS: limited due to aphasia/cognition however she indicates no pain.   Objective: Vital Signs: Blood pressure 142/99, pulse 71, temperature 98.5 F (36.9 C), temperature source Oral, resp. rate 16, height  (1.499 m), weight 34.7 kg (76 lb 8 oz), SpO2 100 %. No results found. No results for input(s): WBC, HGB, HCT, PLT in the last 72 hours.  Recent Labs  01/18/16 0651 01/20/16 0650  NA 141 140  K 3.2* 5.1  CL 100* 105  GLUCOSE 45* 128*  BUN 28* 26*  CREATININE 0.97 0.88  CALCIUM 8.0* 8.7*   CBG (last 3)   Recent Labs  01/19/16 1711 01/19/16 2053 01/20/16 0653  GLUCAP 257* 228* 136*    Wt Readings from Last 3 Encounters:  01/20/16 34.7 kg (76 lb 8 oz)  01/07/16 36.2 kg (79 lb 12.9 oz)  12/24/15 41.912 kg (92 lb 6.4 oz)    Physical Exam:  Constitutional: She appears well-developed. She appears cachectic. She is sleeping. She is easily aroused. She has a sickly appearance. No distress.  HENT:  Head: Normocephalic and atraumatic.  Mouth/Throat: Abnormal dentition.  Teeth with crusted material  Eyes: Conjunctivae and EOM are normal. Pupils are equal, round, and reactive to light.  Neck: Normal range of motion. Neck supple.  Cardiovascular: An irregularly irregular rhythm present.  Murmur heard. Respiratory: Effort normal and breath sounds normal. No respiratory distress. She has no wheezes.  GI: Soft. Bowel sounds are normal. She exhibits no distension. There is no tenderness.  Musculoskeletal: She exhibits tenderness. She exhibits no edema.  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 Right 1st and 2nd toes ischemic appearing with early blister--painted with betadine.  Pain with ROM left hip    Neurological: She  is very alert.  Exp>rec aphasia. Word substitution. Does improved with repetition of given question. Corrects herself at times.    Skin: Skin is warm and dry. She is not diaphoretic.  Psychiatric: Her affect is bright/happy.         Assessment/Plan: 1. Aphasia, cognitive, and functional deficits  secondary to TBI which require 3+ hours per day of interdisciplinary therapy in a comprehensive inpatient rehab setting. Physiatrist is providing close team supervision and 24 hour management of active medical problems listed below. Physiatrist and rehab team continue to assess barriers to discharge/monitor patient progress toward functional and medical goals.  Function:  Bathing Bathing position Bathing activity did not occur: Safety/medical concerns Position: Shower  Bathing parts Body parts bathed by patient: Right arm, Left arm, Chest, Abdomen, Front perineal area, Right upper leg, Left upper leg Body parts bathed by helper: Right lower leg, Left lower leg, Back, Buttocks  Bathing assist Assist Level: Touching or steadying assistance(Pt > 75%)      Upper Body Dressing/Undressing Upper body dressing Upper body dressing/undressing activity did not occur: Safety/medical concerns What is the patient wearing?: Pull over shirt/dress     Pull over shirt/dress - Perfomed by patient: Thread/unthread right sleeve, Thread/unthread left sleeve, Put head through opening Pull over shirt/dress - Perfomed by helper: Pull shirt over trunk        Upper body assist Assist Level: Touching or steadying assistance(Pt > 75%)      Lower Body Dressing/Undressing Lower body dressing  What is the patient wearing?: Pants, Non-skid slipper socks, Shoes     Pants- Performed by patient: Thread/unthread right pants leg, Pull pants up/down Pants- Performed by helper: Thread/unthread left pants leg   Non-skid slipper socks- Performed by helper: Don/doff right sock, Don/doff left sock       Shoes -  Performed by helper: Don/doff right shoe, Don/doff left shoe, Fasten right, Fasten left          Lower body assist Assist for lower body dressing: Touching or steadying assistance (Pt > 75%)      Toileting Toileting Toileting activity did not occur: Safety/medical concerns Toileting steps completed by patient: Adjust clothing prior to toileting, Performs perineal hygiene, Adjust clothing after toileting Toileting steps completed by helper: Performs perineal hygiene Toileting Assistive Devices: Other (comment) (RW)  Toileting assist Assist level: Touching or steadying assistance (Pt.75%)   Transfers Chair/bed transfer   Chair/bed transfer method: Stand pivot, Ambulatory Chair/bed transfer assist level: Touching or steadying assistance (Pt > 75%) Chair/bed transfer assistive device: Walker, Designer, fashion/clothing     Max distance: 20 Assist level: Touching or steadying assistance (Pt > 75%)   Wheelchair   Type: Manual Max wheelchair distance: 10 Assist Level: Dependent (Pt equals 0%)  Cognition Comprehension Comprehension assist level: Understands basic 25 - 49% of the time/ requires cueing 50 - 75% of the time  Expression Expression assist level: Expresses basis less than 25% of the time/requires cueing >75% of the time.  Social Interaction Social Interaction assist level: Interacts appropriately 25 - 49% of time - Needs frequent redirection.  Problem Solving Problem solving assist level: Solves basic less than 25% of the time - needs direction nearly all the time or does not effectively solve problems and may need a restraint for safety  Memory Memory assist level: Recognizes or recalls less than 25% of the time/requires cueing greater than 75% of the time   Medical Problem List and Plan: 1. Aphasia, gait disorder, cognitive deficits secondary to Left subdural hematoma, left superior inferior pubic ramus fracture status post fall  -continue CIR therapies. She is  like a different person today!! 2. DVT Prophylaxis/Anticoagulation: Mechanical: Sequential compression devices, below knee Bilateral lower extremities 3. Pain Management: comfortable at present  -follow as she works with therapy 4. Mood: LCSW to follow for evaluation and support.  5. Neuropsych: This patient is not capable of making decisions on her own behalf.  6. Skin/Wound Care: Prevalon boots bilateral feet--elevate when in bed chair. Needs to have shoes on when out of bed.  7. Fluids/Electrolytes/Nutrition: Monitor I/O. Needs assistance with meals.   -potassium at high end of normal now--- can back off slightly  -I personally reviewed the patient's labs today. Encourage fluids 8. A fib with severe mitral stenosis and symptomatic AS:  Monitor for CP or orthostatic symptoms.  9. DM type 2: monitor BS ac/hs.  Decreased lantus to 5u qhs due to hypoglycemia and inadequate intake.  -add am 5u dose today to cover higher pm numbers  -cbg 136 this am  10. Malnutrition:  protein supplement/encourage po  -increasing arousal should help 11. CKD s/p renal transplant: ON cyclosporine, prednisone and cellcept.  12. PAD with claudication and gangrenous change left toes: conservative care for now---will need amputation at some point 13. HTN: Monitor BP bid. Continue norvasc 14. New onset seizures: On keppra 1000 mg bid---reduced to 750mg  bid due to lethargy     LOS (Days) 4 A FACE TO FACE EVALUATION  WAS PERFORMED  SWARTZ,ZACHARY T 01/20/2016 8:52 AM

## 2016-01-20 NOTE — Progress Notes (Signed)
Occupational Therapy Session Note  Patient Details  Name: [name] [name] MRN: [id number] Date of Birth: Apr 10, 1946  Today's Date: 01/20/2016 OT Individual Time: 1432-1500 OT Individual Time Calculation (min): 28 min    Skilled Therapeutic Interventions/Progress Updates:    Pt not oriented to place or situation during session.  She did state "hospital" when asked where she was but could not tell me it was [facility].  She stated also that we were in "[place]" and when oriented that this was [place], she could not state the city.  When asked about her reason for hospitalization she replied "yes".  Multiple times when therapist would ask questions her only response would be "okay" or "yes".  Max instructional cueing for sequencing and initiation of donning Darko shoes on bilateral feet.  Min assist for transfer to the wheelchair with mod demonstrational cueing to complete setup and performance of oral hygiene.  Pt left in wheelchair with safety belt in place and call button in reach with SLP expected immediately.    Therapy Documentation Precautions:  Precautions Precautions: Fall Precaution Comments: bilateral DARCO shoes Restrictions Weight Bearing Restrictions: No LLE Weight Bearing: Weight bearing as tolerated  Pain: Pain Assessment Pain Assessment: Faces Faces Pain Scale: Hurts whole lot Pain Type: Acute pain Pain Location: Foot Pain Orientation: Right Pain Descriptors / Indicators: Crying;Grimacing Pain Frequency: Constant Pain Onset: Unable to tell Patients Stated Pain Goal: 3 Pain Intervention(s): RN made aware;Repositioned ADL: See Function Navigator for Current Functional Status.   Therapy/Group: Individual Therapy  Flemon Kelty OTR/L 01/20/2016, 4:03 PM

## 2016-01-20 NOTE — Progress Notes (Signed)
Speech Language Pathology Daily Session Note  Patient Details  Name: [name] [name] MRN: [id number] Date of Birth: [birth year]  Today's Date: 01/20/2016 SLP Individual Time: 1005-1050 SLP Individual Time Calculation (min): 45 min  Missed 15 minutes due to refusal  Short Term Goals: Week 1: SLP Short Term Goal 1 (Week 1): Pt to communicate basic needs via any means with mod A. SLP Short Term Goal 2 (Week 1): Pt able to 1-step directives with mod A. SLP Short Term Goal 3 (Week 1): Pt able to produce single words with mod A for compensatory strategies- semantic cueing, sentence completion, etc. SLP Short Term Goal 4 (Week 1): Pt able to maintain attention to task for 5 minutes with mod A. SLP Short Term Goal 5 (Week 1): Pt able to demonstrate yes/no response with 70% acc with max A.  Skilled Therapeutic Interventions: Skilled treatment session focused on cognitive-linguistic goals. Upon arrival, patient was sitting upright in the wheelchair and began to moan when clinician entered room because she wanted to lay down and did not want to participate in treatment session. Patient was extremely labile and required Max encouragement for participation throughout the session. SLP facilitated session by providing extra time and Max-Total A multimodal cues for patient to sort coins from a field of two and then perform the automatic task of counting the number of coins. Patient would sustain her attention to a task for ~30-60 seconds and then would place her head down on the table. Patient eventually reaching out for th bed with refusal to participate any further in treatment session, therefore, patient was transferred back to bed and "cried out" in pain during transfer. Patient left supine in bed with all needs within reach. Continue with current plan of care.    Function:  Eating Eating   Modified Consistency Diet: Yes Eating Assist Level: Set up assist for;Supervision or verbal cues;Helper checks for  pocketed food;Helper scoops food on utensil;Helper brings food to mouth;Help managing cup/glass   Eating Set Up Assist For: Opening containers Helper Scoops Food on Utensil: Occasionally Helper Brings Food to Mouth: Occasionally   Cognition Comprehension Comprehension assist level: Understands basic 25 - 49% of the time/ requires cueing 50 - 75% of the time  Expression   Expression assist level: Expresses basis less than 25% of the time/requires cueing >75% of the time.  Social Interaction Social Interaction assist level: Interacts appropriately 25 - 49% of time - Needs frequent redirection.  Problem Solving Problem solving assist level: Solves basic less than 25% of the time - needs direction nearly all the time or does not effectively solve problems and may need a restraint for safety  Memory Memory assist level: Recognizes or recalls less than 25% of the time/requires cueing greater than 75% of the time    Pain Pain Assessment Pain Assessment: Faces Faces Pain Scale: Hurts whole lot Pain Type: Acute pain Pain Location: Foot Pain Orientation: Right Pain Descriptors / Indicators: Crying;Grimacing Pain Frequency: Constant Pain Onset: Unable to tell Patients Stated Pain Goal: 3 Pain Intervention(s): RN made aware;Repositioned  Therapy/Group: Individual Therapy  Keane Martelli 01/20/2016, 4:19 PM

## 2016-01-20 NOTE — Progress Notes (Signed)
Trish Mage, RN Rehab Admission Coordinator Signed Physical Medicine and Rehabilitation PMR Pre-admission 01/16/2016 1:49 PM  Related encounter: ED to Hosp-Admission (Discharged) from 01/07/2016 in [facility] 5 CENTRAL NEURO SURGICAL    Expand All Collapse All   PMR Admission Coordinator Pre-Admission Assessment  Patient: [name] [name] is an 70 y.o., female MRN: [id number] DOB: 01-16-1946 Height: 5\' 2"  (157.5 cm) Weight: 36.2 kg (79 lb 12.9 oz)  Insurance Information HMO: PPO: PCP: IPA: 80/20: OTHER:  PRIMARY: Medicare A/B Policy#: [id number] Subscriber: Charleston Ropes CM Name: Phone#: Fax#:  Pre-Cert#: Employer: Retired Benefits: Phone #: Name: Checked in [place]. Date: 09/07/90 Deduct: $1316 Out of Pocket Max: None Life Max: unlimited CIR: 100% SNF: 100 days Outpatient: 80% Co-Pay: 20% Home Health: 100% Co-Pay: none DME: 80% Co-Pay: 20% Providers: patient's choice  SECONDARY: BCBS supplement Policy#: [id number] Subscriber: Charleston Ropes CM Name: Phone#: Fax#:  Pre-Cert#: Employer: Retired Benefits: Phone #: [phone] Name:  Eff. Date: Deduct: Out of Pocket Max: Life Max:  CIR: SNF:  Outpatient: Co-Pay:  Home Health: Co-Pay:  DME: Co-Pay:   Emergency Contact Information Contact Information    Name Relation Home Work Mobile   Cuthrell,Tammy Daughter   [phone]     Current Medical History  Patient Admitting Diagnosis:TBI/pelvic injuries after fall from bed. Hx of MS   History of Present Illness: A 70 y.o. female with history fo HTN, CVA, severe mitral stenosis, and symptomatic AS,  T2DM, CKD s/p renal transplant, PAF-on coumadin, gait disorder, PAD with severe claudication LLE and bilateral foot ulcers (with plans for partial foot amputation next week) who was admitted on 01/07/16 due to fall out of bed--she struck her head and EMS called but patient refused to go to the hospital till later that evening due to inability to get out of bed. She was noted to have multiple contusion on right side and Minimally displaced left superior and inferior pubic rami with possible extension into left acetabulum. 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 She was evaluated by Dr. Jordan Likes who recommended reversal of coumadin and serial CT for monitoring. Dr. Renaye Rakers recommended WBAT on LLE. Follow up CCT showed ncrease in bleed and edema and she did have worsening of expressive aphasia as well focal motor seizure activity.   She was placed on keppra with improvement in symptoms. Serial CCT monitored--last of 05/09 showed mild increase in edema left frontal lobe and associated SAH/SDH without significant improvement. Patient globally aphasic and 24 hour EEG 5/10 showed slowing across right frontoparietal regions question toxic metabolic encephalopathy v/s neuronal dysfunction or cortical irritability. Dr. Jordan Likes recommended holding coumadin indefinitely. Plans for amputations deferred per IM. She has had some improvement in ability to answer Y/N questions, has had issue with tachybrady issues --HR 40-90 and pain BLE with weight bearing. CIR recommended by rehab team and patient admitted today.   Note: Patient was scheduled to have a partial left foot amputation at [facility], but it was cancelled due to accident. Dr. Lajoyce Corners feels patient will need a BKA due to poor blood supply. Trauma recommending follow-up with pending amputation as an outpatient after rehab discharge.    Past Medical History  Past Medical History  Diagnosis Date  . Hypertension   . Stroke [facility]) December 2011    left MCA distribution  . Permanent atrial fibrillation (HCC)  coumadin clinic at [facility]. Has previously required Lovenox->Coumadin bridge.  . Severe mitral stenosis by prior echocardiogram     last echo 07/2012  . Aortic stenosis   . Symptomatic bradycardia     s/p pacer  . Depression   . HSV (herpes simplex virus) infection   . Anemia   . Hyperparathyroidism, primary (HCC)     s/p parathyroidectomy  . PAD (peripheral artery disease) (HCC)     a. s/p RLE angioplasty with [name]. b. (by [name]) L popliteral artery orbital atherectomy and balloon angioplasty & L SFA orbital atherectomy and balloon angioplasty 11/30/12.  [name] PVD (peripheral vascular disease) (HCC)   . Presence of permanent cardiac pacemaker   . High cholesterol   . CHF (congestive heart failure) (HCC)   . TIA (transient ischemic attack) 08/2009    [name] 09/05/2009  . Atrial thrombus (HCC)     appendage/notes 09/05/2009  . Type II diabetes mellitus (HCC)   . History of blood transfusion     related to "kidney transplant"  . Chronic kidney disease     s/p renal transplant; follows with [name]  . GERD (gastroesophageal reflux disease)     hx  . History of stomach ulcers   . Diabetic foot ulcer (HCC)     "left is worse than right" (11/20/2015)    Family History  family history includes Kidney disease in her brother and sister. There is no history of Heart attack or Stroke. She was adopted.  Prior Rehab/Hospitalizations: No previous rehab admissions.  Has the patient had major surgery during 100 days prior to admission? Yes. Had a stent placed in her leg around 11/23/15  Current Medications   Current facility-administered medications:  . amLODipine  (NORVASC) tablet 5 mg, 5 mg, Oral, Daily, Jimmye Norman, MD, 5 mg at 01/16/16 0956 . antiseptic oral rinse (CPC / CETYLPYRIDINIUM CHLORIDE 0.05%) solution 7 mL, 7 mL, Mouth Rinse, BID, Jimmye Norman, MD, 7 mL at 01/16/16 0957 . chlorhexidine (PERIDEX) 0.12 % solution 15 mL, 15 mL, Mouth Rinse, BID, Jimmye Norman, MD, 15 mL at 01/16/16 0957 . cycloSPORINE modified (NEORAL) capsule 25 mg, 25 mg, Oral, BID, Harriette Bouillon, MD, 25 mg at 01/16/16 0957 . digoxin (LANOXIN) tablet 0.125 mg, 0.125 mg, Oral, Daily, Jimmye Norman, MD, 0.125 mg at 01/16/16 0956 . feeding supplement (GLUCERNA SHAKE) (GLUCERNA SHAKE) liquid 237 mL, 237 mL, Oral, TID BM, Heather C Pitts, RD, 237 mL at 01/16/16 1020 . HYDROmorphone (DILAUDID) injection 0.5 mg, 0.5 mg, Intravenous, Q2H PRN, Harriette Bouillon, MD, 0.5 mg at 01/13/16 2341 . hydroxypropyl methylcellulose / hypromellose (ISOPTO TEARS / GONIOVISC) 2.5 % ophthalmic solution 1 drop, 1 drop, Both Eyes, QID PRN, Harriette Bouillon, MD . insulin aspart (novoLOG) injection 0-15 Units, 0-15 Units, Subcutaneous, TID WC, Emelia Loron, MD, 3 Units at 01/15/16 1731 . insulin glargine (LANTUS) injection 13 Units, 13 Units, Subcutaneous, Daily, Freeman Caldron, PA-C, 13 Units at 01/16/16 [phone] . levETIRAcetam (KEPPRA) tablet 1,000 mg, 1,000 mg, Oral, BID, Freeman Caldron, PA-C, 1,000 mg at 01/16/16 1220 . metoprolol tartrate (LOPRESSOR) tablet 25 mg, 25 mg, Oral, BID, Freeman Caldron, PA-C, 25 mg at 01/16/16 0957 . mycophenolate (CELLCEPT) capsule 500 mg, 500 mg, Oral, BID, Harriette Bouillon, MD, 500 mg at 01/16/16 0957 . ondansetron (ZOFRAN) tablet 4 mg, 4 mg, Oral, Q6H PRN **OR** ondansetron (ZOFRAN) injection 4 mg, 4 mg, Intravenous, Q6H PRN, Harriette Bouillon, MD . pravastatin (PRAVACHOL) tablet 20 mg, 20 mg, Oral, QPM, Maisie Fus  Cornett, MD, 20 mg at 01/15/16 1731 . predniSONE (DELTASONE) tablet 5 mg, 5 mg, Oral, Daily, Jimmye Norman, MD, 5 mg at 01/16/16 0958 . valACYclovir  (VALTREX) tablet 500 mg, 500 mg, Oral, Q M,W,F, Harriette Bouillon, MD, 500 mg at 01/15/16 1024 . [START ON 01/20/2016] Vitamin D (Ergocalciferol) (DRISDOL) capsule 50,000 Units, 50,000 Units, Oral, Q30 days, Harriette Bouillon, MD  Patients Current Diet: DIET DYS 3 Room service appropriate?: Yes; Fluid consistency:: Thin  Precautions / Restrictions Precautions Precautions: Fall Precaution Comments: watch HR Restrictions Weight Bearing Restrictions: Yes LLE Weight Bearing: Weight bearing as tolerated   Has the patient had 2 or more falls or a fall with injury in the past year?Yes. Daughter reports 6 falls with injury including severe bruising and the latest injuries related to current hospital admission.  Prior Activity Level Community (5-7x/wk): Went out daily, was driving.  Home Assistive Devices / Equipment Home Assistive Devices/Equipment: Dan Humphreys (specify type) Home Equipment: Walker - 2 wheels, Walker - 4 wheels, Cane - quad  Prior Device Use: Indicate devices/aids used by the patient prior to current illness, exacerbation or injury? Walker  Prior Functional Level Prior Function Level of Independence: Independent with assistive device(s) Comments: used rollator mainly for gait (from previous admission note)  Self Care: Did the patient need help bathing, dressing, using the toilet or eating? Independent  Indoor Mobility: Did the patient need assistance with walking from room to room (with or without device)? Independent  Stairs: Did the patient need assistance with internal or external stairs (with or without device)? Independent  Functional Cognition: Did the patient need help planning regular tasks such as shopping or remembering to take medications? Independent  Current Functional Level Cognition  Arousal/Alertness: Awake/alert Overall Cognitive Status: Difficult to assess Difficult to assess due to: Impaired communication (aphasia) Current Attention Level: Focused  (progressing to sustained) Orientation Level: Oriented to person, Disoriented to time, Disoriented to situation, Oriented to place Following Commands: Follows one step commands with increased time General Comments: Able to answer some questions appropriately and state a few words, "food," Perseverates on yes. "the foot is in the chair" Attention: Focused, Sustained Focused Attention: Appears intact Sustained Attention: Impaired Sustained Attention Impairment: Functional basic Memory: (unable to assess, patient non-verbal) Awareness: Impaired Awareness Impairment: Intellectual impairment Problem Solving: Impaired Problem Solving Impairment: Functional basic Executive Function: Self Monitoring Self Monitoring: Impaired Self Monitoring Impairment: Functional basic Behaviors: Impulsive   Extremity Assessment (includes Sensation/Coordination)  Upper Extremity Assessment: Generalized weakness  Lower Extremity Assessment: Defer to PT evaluation RLE Deficits / Details: Pt was able to move bil legs to EOB and go back to supine in bed on her own power. She is at least 3-/5 per functional assessment. Pt with wounds on bil feet indicating ischemia. Pt seems more painful on the left leg which would be more consistant with pubic rami fractures.  LLE Deficits / Details: Pt was able to move bil legs to EOB and go back to supine in bed on her own power. She is at least 3-/5 per functional assessment. Pt with wounds on bil feet indicating ischemia. Pt seems more painful on the left leg which would be more consistant with pubic rami fractures.     ADLs  Overall ADL's : Needs assistance/impaired Eating/Feeding: Set up, Sitting Grooming: Wash/dry hands, Wash/dry face, Oral care, Brushing hair, Set up, Sitting Upper Body Bathing: Supervision/ safety, Set up, Sitting Lower Body Bathing: Moderate assistance, Bed level Upper Body Dressing : Minimal assistance Lower Body Dressing: Moderate  assistance, Bed level Lower Body Dressing Details (indicate cue type and reason): Pt able to don bil. socks in long sitting  Toilet Transfer: Total assistance Toilet Transfer Details (indicate cue type and reason): Pt unable to attempt due to increased HR  Toileting- Clothing Manipulation and Hygiene: Total assistance, Bed level General ADL Comments: Increased HR limited activity     Mobility  Overal bed mobility: Needs Assistance Bed Mobility: Supine to Sit Rolling: Min assist Supine to sit: HOB elevated, Mod assist Sit to supine: Min guard General bed mobility comments: Able to initiate bringing BLEs to EOB, requires multimodal cues to scoot bottom to EOB; assist to elevate trunk. + dizziness noted with sway.    Transfers  Overall transfer level: Needs assistance Equipment used: Rolling walker (2 wheeled) Transfers: Sit to/from Stand, [employer] Transfers Sit to Stand: Mod assist Stand pivot transfers: Mod assist General transfer comment: Mod A to boost from EOB with cues for hand placement/technique. Stood Clinical research associate. SPT to chair with Mod A due to posterior bias. Wincing in pain with WB. HR variable 38-92 bpm.    Ambulation / Gait / Stairs / Wheelchair Mobility       Posture / Balance Dynamic Sitting Balance Sitting balance - Comments: Able to sit EOB with BUe support.  Balance Overall balance assessment: Needs assistance Sitting-balance support: Feet supported, No upper extremity supported Sitting balance-Leahy Scale: Good Sitting balance - Comments: Able to sit EOB with BUe support.  Standing balance support: During functional activity, Bilateral upper extremity supported Standing balance-Leahy Scale: Poor Standing balance comment: Reliant on BUEs for support with RW in standing and Min A for pericare. Able to stand ~1 minute.    Special needs/care consideration BiPAP/CPAP No CPM No Continuous Drip IV No Dialysis No  Life Vest No Oxygen No Special  Bed No Trach Size No Wound Vac (area) No  Skin Toes are ischemic and amputation will be needed at some point after rehab is completed.  Bowel mgmt: Last BM documented 01/09/16 Bladder mgmt: voiding with incontinence Diabetic mgmt Yes, on insulin at home    Previous Home Environment Living Arrangements: Alone Available Help at Discharge: Family, Available PRN/intermittently Type of Home: House Home Layout: One level Home Access: Level entry Bathroom Toilet: Standard Bathroom Accessibility: No Home Care Services: No Additional Comments: Pt unable to provide info. PLOF and home living populated from previous admission   Discharge Living Setting Plans for Discharge Living Setting: Patient's home, Alone, House (Lives alone in a condo.) Type of Home at Discharge: House (Condominium) Discharge Home Layout: One level Discharge Home Access: Level entry Does the patient have any problems obtaining your medications?: No  Social/Family/Support Systems Patient Roles: Parent, Other (Comment) (Has ex-husband, and 2 daughters.) Contact Information: Tammy Cuthrell - daughter Anticipated Caregiver: ex-husband, daughters, hire help if needed Anticipated Caregiver's Contact Information: [name] - daughter - [phone] Ability/Limitations of Caregiver: One daughter lives in [place]. Another daughter works locally. Ex-husband Caregiver Availability: 24/7 (Daughter aware patient will likely need 24/7 supervision) Discharge Plan Discussed with Primary Caregiver: Yes Is Caregiver In Agreement with Plan?: Yes Does Caregiver/Family have Issues with Lodging/Transportation while Pt is in Rehab?: No  Goals/Additional Needs Patient/Family Goal for Rehab: PT/OT/ST supervision goals Expected length of stay: 9-14 days Cultural Considerations: None Dietary Needs: Dys 3, thin liquids Equipment Needs: TBD Pt/Family Agrees to Admission and willing to participate: Yes Program  Orientation Provided & Reviewed with Pt/Caregiver Including Roles & Responsibilities: Yes  Decrease burden of Care through IP rehab admission:  N/A  Possible need for SNF placement upon discharge: Yes, if daughter unable to work out help after rehab discharge.  Patient Condition: This patient's medical and functional status has changed since the consult dated: 01/10/16 in which the Rehabilitation Physician determined and documented that the patient's condition is appropriate for intensive rehabilitative care in an inpatient rehabilitation facility. See "History of Present Illness" (above) for medical update. Functional changes are: Currently requiring mod assist for stand pivot transfers. Patient's medical and functional status update has been discussed with the Rehabilitation physician and patient remains appropriate for inpatient rehabilitation. Will admit to inpatient rehab today.  Preadmission Screen Completed By: Trish Mage, 01/16/2016 2:06 PM ______________________________________________________________________  Discussed status with Dr. Wynn Banker on 01/16/16 at 1403 and received telephone approval for admission today.  Admission Coordinator: Trish Mage, time1406/Date05/11/17          Cosigned by: Erick Colace, MD at 01/16/2016 2:23 PM  Revision History     Date/Time User Provider Type Action   01/16/2016 2:23 PM Erick Colace, MD Physician Cosign   01/16/2016 2:06 PM Trish Mage, RN Rehab Admission Coordinator Sign

## 2016-01-20 NOTE — Progress Notes (Signed)
Physical Therapy Session Note  Patient Details  Name: [name] [name] MRN: [id number] Date of Birth: 01/30/1946  Today's Date: 01/20/2016 PT Individual Time: 0909-1004 PT Individual Time Calculation (min): 55 min   Short Term Goals: Week 1:  PT Short Term Goal 1 (Week 1): Patient will tolerate standing x 3 min while engaging in functional task.  PT Short Term Goal 2 (Week 1): Patient will retrieve object from floor with min A.  PT Short Term Goal 3 (Week 1): Patient will negotiate up/down 4 stairs using 2 rails with min A.  PT Short Term Goal 4 (Week 1): Patient will ambulate 50 ft using RW with steady assist.  PT Short Term Goal 5 (Week 1): Patient will perform bed mobility with supervision.   Skilled Therapeutic Interventions/Progress Updates:   Pt received in bed alert and awake; required multiple cues to transfer to EOB and encouragement to perform without pulling on therapist.  Assisted pt with donning [employer] shoes and pt performed sit> stand and ambulated to bathroom with min A and verbal cues for safe navigation with RW.  Pt able to assist with doffing clothing with verbal cues and min A to maintain balance but required assistance for hygiene and donning pants and clean brief.  Pt performed sit <> stand from toilet with min A but pt crying out when performing sit <> stand and wincing during WB through LLE.  RN notified for pain medication and discussed asking to have medication changed to scheduled vs. PRN secondary to pt aphasia.  Pt transferred to w/c and when therapist mentioned, "therapy"  Pt began to cry uncontrollably, shake her head no and become very restless.  Attempted to calm patient and explain goals of therapy session and purpose of therapy.  Pt was able to calm herself and eventually was agreeable to therapy.  Transported to gym in w/c where pt performed simulated car transfer training stand pivot with RW and min A.  With yes/no questions and pt giving one word responses pt  reporting she will D/C to brother's house and he does have stairs but could not answer how many.  Also performed stair negotiation training with therapist explaining safe stair negotiation sequence first and pt return demonstrating up 2 stairs forwards, descending backwards with mod A due to fear/anxiety and refusing to continue to top of stairs.  All throughout session pt demonstrating unsafe transfers-pt attempting to sit without full pivot and without backing up fully to seat.  Pt educated on falls risk and safety concerns repeatedly and given constant cues to ensure safe transfer.  On final transfer back to w/c pt continued to refuse to step back to chair and sat suddenly in chair despite therapist cues to step closer to seat before sitting.  Continued to educate pt on risk for falling and strongly encouraged pt to attend to and follow therapist cues to prevent fall and further injury.  Pt verbalized understanding.  Pt left in w/c in room with quick release belt in place and handed off to SLP.  Therapy Documentation Precautions:  Precautions Precautions: Fall Precaution Comments: bilateral [employer] shoes Restrictions Weight Bearing Restrictions: No LLE Weight Bearing: Weight bearing as tolerated Pain: Pain Assessment Pain Assessment: Faces Faces Pain Scale: Hurts whole lot Pain Type: Acute pain Pain Location: Penis Pain Orientation: Left Pain Descriptors / Indicators: Crying;Moaning;Grimacing Pain Frequency: Intermittent Pain Onset: On-going Patients Stated Pain Goal: 3 Pain Intervention(s): Medication (See eMAR)   See Function Navigator for Current Functional Status.   Therapy/Group:  Individual Therapy  Edman Circle [facility] 01/20/2016, 12:11 PM

## 2016-01-21 ENCOUNTER — Inpatient Hospital Stay (HOSPITAL_COMMUNITY): Payer: Medicare Other | Admitting: Physical Therapy

## 2016-01-21 ENCOUNTER — Inpatient Hospital Stay (HOSPITAL_COMMUNITY): Payer: Medicare Other

## 2016-01-21 ENCOUNTER — Inpatient Hospital Stay (HOSPITAL_COMMUNITY): Payer: Medicare Other | Admitting: Speech Pathology

## 2016-01-21 LAB — CBC WITH DIFFERENTIAL/PLATELET
BASOS PCT: 0 %
Basophils Absolute: 0 10*3/uL (ref 0.0–0.1)
EOS ABS: 0 10*3/uL (ref 0.0–0.7)
EOS PCT: 0 %
HCT: 36.2 % (ref 36.0–46.0)
HEMOGLOBIN: 11.9 g/dL — AB (ref 12.0–15.0)
Lymphocytes Relative: 5 %
Lymphs Abs: 0.6 10*3/uL — ABNORMAL LOW (ref 0.7–4.0)
MCH: 33.6 pg (ref 26.0–34.0)
MCHC: 32.9 g/dL (ref 30.0–36.0)
MCV: 102.3 fL — ABNORMAL HIGH (ref 78.0–100.0)
MONOS PCT: 7 %
Monocytes Absolute: 0.8 10*3/uL (ref 0.1–1.0)
NEUTROS PCT: 88 %
Neutro Abs: 9.3 10*3/uL — ABNORMAL HIGH (ref 1.7–7.7)
PLATELETS: 154 10*3/uL (ref 150–400)
RBC: 3.54 MIL/uL — ABNORMAL LOW (ref 3.87–5.11)
RDW: 14.7 % (ref 11.5–15.5)
WBC: 10.6 10*3/uL — ABNORMAL HIGH (ref 4.0–10.5)

## 2016-01-21 LAB — GLUCOSE, CAPILLARY
GLUCOSE-CAPILLARY: 212 mg/dL — AB (ref 65–99)
GLUCOSE-CAPILLARY: 325 mg/dL — AB (ref 65–99)
Glucose-Capillary: 199 mg/dL — ABNORMAL HIGH (ref 65–99)
Glucose-Capillary: 286 mg/dL — ABNORMAL HIGH (ref 65–99)

## 2016-01-21 NOTE — Progress Notes (Signed)
Speech Language Pathology Daily Session Note  Patient Details  Name: [name] [name] MRN: [id number] Date of Birth: 12/20/1945  Today's Date: 01/21/2016 SLP Individual Time: 1000-1100 SLP Individual Time Calculation (min): 60 min  Short Term Goals: Week 1: SLP Short Term Goal 1 (Week 1): Pt to communicate basic needs via any means with mod A. SLP Short Term Goal 2 (Week 1): Pt able to 1-step directives with mod A. SLP Short Term Goal 3 (Week 1): Pt able to produce single words with mod A for compensatory strategies- semantic cueing, sentence completion, etc. SLP Short Term Goal 4 (Week 1): Pt able to maintain attention to task for 5 minutes with mod A. SLP Short Term Goal 5 (Week 1): Pt able to demonstrate yes/no response with 70% acc with max A.  Skilled Therapeutic Interventions: Skilled treatment session focused on cognitive-linguistic goals. Upon arrival, patient was awake while sitting upright in the wheelchair and began to cry when clinician entered the room. SLP facilitated session by providing Mod A semantic cues for naming of functional items with 90% accuracy and for matching a word to an object from a field of 2 with Max A multimodal cues. Patient was oriented to place, time and situation with Mod A question cues with written choices from a field of 2. Patient labile throughout session but was easily redirected. Patient transferred back to bed at end of session and left supine with all needs within reach. Continue with current plan of care.    Function:  Eating Eating   Modified Consistency Diet: Yes Eating Assist Level: Supervision or verbal cues;More than reasonable amount of time;Helper checks for pocketed food   Eating Set Up Assist For: Opening containers       Cognition Comprehension Comprehension assist level: Understands basic 25 - 49% of the time/ requires cueing 50 - 75% of the time  Expression   Expression assist level: Expresses basis less than 25% of the  time/requires cueing >75% of the time.  Social Interaction Social Interaction assist level: Interacts appropriately 25 - 49% of time - Needs frequent redirection.  Problem Solving Problem solving assist level: Solves basic less than 25% of the time - needs direction nearly all the time or does not effectively solve problems and may need a restraint for safety  Memory Memory assist level: Recognizes or recalls less than 25% of the time/requires cueing greater than 75% of the time    Pain Intermittent grimace and moaning when transferring to bed, patient repositioned   Therapy/Group: Individual Therapy  Orella Cushman 01/21/2016, 2:05 PM

## 2016-01-21 NOTE — Progress Notes (Signed)
Patient reported to have blood tinged urine X 2 today--will check UA/UCS as well as CBC.  No abdominal discomfort expressed.

## 2016-01-21 NOTE — Progress Notes (Signed)
Speech Language Pathology Daily Session Note  Patient Details  Name: [name] [name] MRN: [id number] Date of Birth: 08/25/1946  Today's Date: 01/21/2016 SLP Individual Time: 1401-1501 (15 MINUTES MAKE UP TIME) SLP Individual Time Calculation (min): 60 min  Short Term Goals: Week 1: SLP Short Term Goal 1 (Week 1): Pt to communicate basic needs via any means with mod A. SLP Short Term Goal 2 (Week 1): Pt able to 1-step directives with mod A. SLP Short Term Goal 3 (Week 1): Pt able to produce single words with mod A for compensatory strategies- semantic cueing, sentence completion, etc. SLP Short Term Goal 4 (Week 1): Pt able to maintain attention to task for 5 minutes with mod A. SLP Short Term Goal 5 (Week 1): Pt able to demonstrate yes/no response with 70% acc with max A.  Skilled Therapeutic Interventions: Skilled treatment session focused on addressing cognitive-linguistic goals. SLP facilitated session by providing set-up and Mod assist verbal and visual cues to follow 1-step directives during a familiar/basic problem solving task.  Patient initiated appropriate 1 word utterances in 2 opportunities today.  She pointed to a therapist and said "cute" and also pointed to SLP and said "drink" other spontaneous speech was perseverative and at times echolalic in nature with Max assist sentence completion cues to correct.  Patient with 50% accuracy with yes/no questions, chance level with Max assist multimodal cues.  Continue with current plan of care.    Function:  Cognition Comprehension Comprehension assist level: Understands basic 25 - 49% of the time/ requires cueing 50 - 75% of the time  Expression   Expression assist level: Expresses basis less than 25% of the time/requires cueing >75% of the time.  Social Interaction Social Interaction assist level: Interacts appropriately 25 - 49% of time - Needs frequent redirection.  Problem Solving Problem solving assist level: Solves basic less  than 25% of the time - needs direction nearly all the time or does not effectively solve problems and may need a restraint for safety  Memory Memory assist level: Recognizes or recalls less than 25% of the time/requires cueing greater than 75% of the time    Pain Pain Assessment Pain Assessment: No/denies pain  Therapy/Group: Individual Therapy  Charlane Ferretti., CCC-SLP [phone]  [name] [name] 01/21/2016, 4:44 PM

## 2016-01-21 NOTE — Progress Notes (Signed)
Physical Therapy Session Note  Patient Details  Name: [name] [name] MRN: [id number] Date of Birth: [birth year]  Today's Date: 01/21/2016 PT Individual Time: 0800-0900 PT Individual Time Calculation (min): 60 min   Short Term Goals: Week 1:  PT Short Term Goal 1 (Week 1): Patient will tolerate standing x 3 min while engaging in functional task.  PT Short Term Goal 2 (Week 1): Patient will retrieve object from floor with min A.  PT Short Term Goal 3 (Week 1): Patient will negotiate up/down 4 stairs using 2 rails with min A.  PT Short Term Goal 4 (Week 1): Patient will ambulate 50 ft using RW with steady assist.  PT Short Term Goal 5 (Week 1): Patient will perform bed mobility with supervision.   Skilled Therapeutic Interventions/Progress Updates:   Patient in bed upon arrival, closing eyes and pretending to cry upon therapist entering room. With max encouragement, patient sat edge of bed with supervision, total A to don socks and B DARCO shoes. Patient noted to be incontinent of urine and bowel. Patient stood from edge of bed by pushing up from bed with BUE with supervision and ambulated to bathroom using RW with supervision but required total A for safe management of RW due to patient advancing RW too far away from body and running into obstacles despite cues. Performed toilet transfer with min A. Patient required max verbal cues for thoroughness for hygiene with setup assist/supervision in standing. Donned clean brief total A in standing. From wheelchair level, patient donned pants with supervision/setup assist and more than reasonable time and stood with min A to pull pants up. Patient donned deodorant and shirt from wheelchair with setup and increased time and one cue to initiate buttoning up shirt. Patient consumed breakfast meal in quiet environment with mod verbal cues to attend to task, max verbal/demonstrational cueing to complete setup and perform oral hygiene, and setup assist to wash  face from wheelchair. Patient appeared to be limited by impaired attention to functional tasks throughout session demonstrated by inability to complete a task without cuing. Patient demonstrated how to call nurse and operate TV on call bell appropriately. Patient left sitting in wheelchair with quick release belt on and needs in reach.   Therapy Documentation Precautions:  Precautions Precautions: Fall Precaution Comments: bilateral DARCO shoes Restrictions Weight Bearing Restrictions: No LLE Weight Bearing: Weight bearing as tolerated Pain: Pain Assessment Pain Assessment: No/denies pain Faces Pain Scale: Hurts a little bit Pain Type: Acute pain Pain Location: Leg Pain Orientation: Left Pain Descriptors / Indicators: Aching;Grimacing Pain Onset: With Activity Pain Intervention(s): Rest;Distraction   See Function Navigator for Current Functional Status.   Therapy/Group: Individual Therapy  Kerney Elbe 01/21/2016, 8:56 AM

## 2016-01-21 NOTE — Progress Notes (Signed)
Physical Therapy Session Note  Patient Details  Name: [name] [name] MRN: [id number] Date of Birth: 04/25/1946  Today's Date: 01/21/2016 PT Individual Time: 9166-0600 PT Individual Time Calculation (min): 30 min   Short Term Goals: Week 1:  PT Short Term Goal 1 (Week 1): Patient will tolerate standing x 3 min while engaging in functional task.  PT Short Term Goal 2 (Week 1): Patient will retrieve object from floor with min A.  PT Short Term Goal 3 (Week 1): Patient will negotiate up/down 4 stairs using 2 rails with min A.  PT Short Term Goal 4 (Week 1): Patient will ambulate 50 ft using RW with steady assist.  PT Short Term Goal 5 (Week 1): Patient will perform bed mobility with supervision.   Skilled Therapeutic Interventions/Progress Updates:    Tx focused on functional mobility training, gait with RW, and NMR via cognitive remediation. Pt resting in bed, able to wake up and engage easily, but with moans and short cries.  Supine>sit with Min A. Sit<>stand x3 with up to Min A for lifting. Gait in room x6' with Min A for initiation and thensteering RW. Toilet transfer with min A, but pt unable to void following a few minutes. Safety cues needed throughout, but she did pull the wall call bell to indicate she was finished. Pt able to follow simple commands and challenged to direct self-cares for Darco boots and asking staff at desk for something to drink. Pt left up in [facility] with lap belt and all needs in reach. Performed bil LE stretching for ROM and fall risk reduction.    Therapy Documentation Precautions:  Precautions Precautions: Fall Precaution Comments: bilateral DARCO shoes Restrictions Weight Bearing Restrictions: No LLE Weight Bearing: Weight bearing as tolerated General: PT Amount of Missed Time (min): 30 Minutes PT Missed Treatment Reason: Patient fatigue Vital Signs: Therapy Vitals Temp: 97.8 F (36.6 C) Temp Source: Axillary Pulse Rate: (!) 54 Resp: 18 BP: 138/76  mmHg Patient Position (if appropriate): Lying Oxygen Therapy SpO2: 100 % O2 Device: Not Delivered Pain: none     See Function Navigator for Current Functional Status.   Therapy/Group: Individual Therapy  Kendrell Lottman Virl Cagey, PT, DPT  01/21/2016, 4:37 PM

## 2016-01-21 NOTE — Progress Notes (Signed)
Physical Therapy Session Note  Patient Details  Name: DEMYAH SMYRE MRN: [id number] Date of Birth: 1946-02-18  Today's Date: 01/21/2016 PT Missed Time: 60 Minutes Missed Time Reason: Patient fatigue  Pt sleeping in bed with covers over head as therapist entered room.  Pt wakes to touch, but declines therapy stating, perseveratively, "tired."  SW in to post safety plan and states pt had just gotten back into bed after ST session.  Will f/u as able. Pt left supine in bed with call bell in reach and needs met.     Trystyn Sitts E Penven-Crew 01/21/2016, 3:05 PM

## 2016-01-21 NOTE — Progress Notes (Signed)
[place] PHYSICAL MEDICINE & REHABILITATION     PROGRESS NOTE    Subjective/Complaints: Slept well. No new issues overnight.   ROS: limited due to aphasia/cognition    Objective: Vital Signs: Blood pressure 119/72, pulse 77, temperature 98.1 F (36.7 C), temperature source Oral, resp. rate 16, height 4\' 11"  (1.499 m), weight 33.9 kg (74 lb 11.8 oz), SpO2 99 %. No results found. No results for input(s): WBC, HGB, HCT, PLT in the last 72 hours.  Recent Labs  01/20/16 0650  NA 140  K 5.1  CL 105  GLUCOSE 128*  BUN 26*  CREATININE 0.88  CALCIUM 8.7*   CBG (last 3)   Recent Labs  01/20/16 1646 01/20/16 2109 01/21/16 0636  GLUCAP 214* 269* 199*    Wt Readings from Last 3 Encounters:  01/21/16 33.9 kg (74 lb 11.8 oz)  01/07/16 36.2 kg (79 lb 12.9 oz)  12/24/15 41.912 kg (92 lb 6.4 oz)    Physical Exam:  Constitutional: She appears well-developed. She appears cachectic.   HENT:  Head: Normocephalic and atraumatic.  Mouth/Throat: poor dentition.  Teeth with crusted material  Eyes: Conjunctivae and EOM are normal. Pupils are equal, round, and reactive to light.  Neck: Normal range of motion. Neck supple.  Cardiovascular: An irregularly irregular rhythm present.  Murmur heard. Respiratory: Effort normal and breath sounds normal. No respiratory distress. She has no wheezes.  GI: Soft. Bowel sounds are normal. She exhibits no distension. There is no tenderness.  Musculoskeletal: She exhibits tenderness. She exhibits no edema.  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 Right 1st and 2nd toes ischemic appearing with early blister--painted with betadine.  Pain with ROM left hip    Neurological: She is very alert.  Exp>rec aphasia. Word substitution. Does improved with repetition of given question. Corrects herself at times.    Skin: Skin is warm and dry. She is not diaphoretic.  Psychiatric: Her  affect is bright/happy.         Assessment/Plan: 1. Aphasia, cognitive, and functional deficits  secondary to TBI which require 3+ hours per day of interdisciplinary therapy in a comprehensive inpatient rehab setting. Physiatrist is providing close team supervision and 24 hour management of active medical problems listed below. Physiatrist and rehab team continue to assess barriers to discharge/monitor patient progress toward functional and medical goals.  Function:  Bathing Bathing position Bathing activity did not occur: Refused Position: Systems developer parts bathed by patient: Right arm, Left arm, Chest, Abdomen, Front perineal area, Right upper leg, Left upper leg Body parts bathed by helper: Right lower leg, Left lower leg, Back, Buttocks  Bathing assist Assist Level: Touching or steadying assistance(Pt > 75%)      Upper Body Dressing/Undressing Upper body dressing   What is the patient wearing?: Pull over shirt/dress     Pull over shirt/dress - Perfomed by patient: Thread/unthread right sleeve, Thread/unthread left sleeve, Put head through opening Pull over shirt/dress - Perfomed by helper: Pull shirt over trunk        Upper body assist Assist Level: Touching or steadying assistance(Pt > 75%)      Lower Body Dressing/Undressing Lower body dressing   What is the patient wearing?: Underwear, Pants, Non-skid slipper socks   Underwear - Performed by helper: Thread/unthread right underwear leg, Thread/unthread left underwear leg, Pull underwear up/down Pants- Performed by patient: Thread/unthread left pants leg Pants- Performed by helper: Thread/unthread right pants  leg, Pull pants up/down   Non-skid slipper socks- Performed by helper: Don/doff right sock, Don/doff left sock       Shoes - Performed by helper: Don/doff right shoe, Don/doff left shoe, Fasten right, Fasten left          Lower body assist Assist for lower body dressing: Touching or  steadying assistance (Pt > 75%)      Toileting Toileting Toileting activity did not occur: Safety/medical concerns Toileting steps completed by patient: Adjust clothing prior to toileting, Performs perineal hygiene, Adjust clothing after toileting Toileting steps completed by helper: Adjust clothing after toileting Toileting Assistive Devices: Grab bar or rail  Toileting assist Assist level: Touching or steadying assistance (Pt.75%)   Transfers Chair/bed transfer   Chair/bed transfer method: Ambulatory Chair/bed transfer assist level: Touching or steadying assistance (Pt > 75%) Chair/bed transfer assistive device: Walker, Designer, fashion/clothing     Max distance: 20 Assist level: Touching or steadying assistance (Pt > 75%)   Wheelchair   Type: Manual Max wheelchair distance: 10 Assist Level: Dependent (Pt equals 0%)  Cognition Comprehension Comprehension assist level: Understands basic 25 - 49% of the time/ requires cueing 50 - 75% of the time  Expression Expression assist level: Expresses basis less than 25% of the time/requires cueing >75% of the time.  Social Interaction Social Interaction assist level: Interacts appropriately 25 - 49% of time - Needs frequent redirection.  Problem Solving Problem solving assist level: Solves basic less than 25% of the time - needs direction nearly all the time or does not effectively solve problems and may need a restraint for safety  Memory Memory assist level: Recognizes or recalls less than 25% of the time/requires cueing greater than 75% of the time   Medical Problem List and Plan: 1. Aphasia, gait disorder, cognitive deficits secondary to Left subdural hematoma, left superior inferior pubic ramus fracture status post fall  -continue CIR therapies. Team conference today 2. DVT Prophylaxis/Anticoagulation: Mechanical: Sequential compression devices, below knee Bilateral lower extremities 3. Pain Management: comfortable at  present  -follow as she works with therapy 4. Mood: LCSW to follow for evaluation and support.  5. Neuropsych: This patient is not capable of making decisions on her own behalf.  6. Skin/Wound Care: Prevalon boots bilateral feet--elevate when in bed chair. Needs to have shoes on when out of bed.  7. Fluids/Electrolytes/Nutrition: Monitor I/O. Needs assistance with meals.   -potassium at high end of normal now--- can back off slightly  -I personally reviewed the patient's labs today. Encourage fluids 8. A fib with severe mitral stenosis and symptomatic AS:  Monitor for CP or orthostatic symptoms.  9. DM type 2: monitor BS ac/hs.  Increase pm lantus back to 10u  -patient eating more now.  -added am 5u dose today to cover higher pm numbers  -cbg 199 this am  10. Malnutrition:  protein supplement/encourage po  -increasing arousal should help 11. CKD s/p renal transplant: ON cyclosporine, prednisone and cellcept.  12. PAD with claudication and gangrenous change left toes: conservative care for now---will need amputation at some point 13. HTN: Monitor BP bid. Continue norvasc 14. New onset seizures: On keppra 1000 mg bid---reduced to  bid due to lethargy     LOS (Days) 5 A FACE TO FACE EVALUATION WAS PERFORMED  SWARTZ,ZACHARY T 01/21/2016 8:19 AM

## 2016-01-21 NOTE — Progress Notes (Signed)
Occupational Therapy Session Note  Patient Details  Name: [name] [name] MRN: [id number] Date of Birth: [birth year]  Today's Date: 01/21/2016 OT Individual Time: 0900-1000 OT Individual Time Calculation (min): 60 min    Short Term Goals: Week 1:  OT Short Term Goal 1 (Week 1): Pt will don shirt with min A. OT Short Term Goal 2 (Week 1): Pt will don pants with mod A. OT Short Term Goal 3 (Week 1): Pt will transfer into shower with min A. OT Short Term Goal 4 (Week 1): Pt will be able to toilet with min A.  Skilled Therapeutic Interventions/Progress Updates:    Pt resting in w/c upon arrival and initially acknowledged therapist but did not respond/react to commands/questions.  Pt did answer positively when questioned about using bathroom.  Pt amb with RW to bathroom to use the toilet and completed toileting tasks with steady A. Pt required max multimodal cues to complete toileting tasks and amb to sink to wash hands.  Pt engaged in functional amb with RW in ADL apartment.  Pt did not follow any commands during ambulation in apartment.  Attempted to engage patient in bathing tasks but pt resistant to engaging in bathing tasks.  Focus on activity tolerance, functional amb with RW, following one step commands, standing balance, and safety awareness.  Therapy Documentation Precautions:  Precautions Precautions: Fall Precaution Comments: bilateral DARCO shoes Restrictions Weight Bearing Restrictions: No LLE Weight Bearing: Weight bearing as tolerated Pain: Pain Assessment Pain Assessment: No/denies pain Faces Pain Scale: Hurts a little bit Pain Type: Acute pain Pain Location: Leg Pain Orientation: Left Pain Descriptors / Indicators: Aching;Grimacing Pain Onset: With Activity Pain Intervention(s): Rest;Distraction ADL: ADL ADL Comments: refer to functional navigator  See Function Navigator for Current Functional Status.   Therapy/Group: Individual Therapy  Rich Brave 01/21/2016, 10:05 AM

## 2016-01-21 NOTE — Progress Notes (Signed)
01/21/16 1607 nursing Per NT patient has blood in urine and  was saved for RN to check. Blood tinged urine noted pinkish -reddish in color. PAM PA notified new orders noted.

## 2016-01-22 ENCOUNTER — Inpatient Hospital Stay (HOSPITAL_COMMUNITY): Payer: Medicare Other

## 2016-01-22 ENCOUNTER — Inpatient Hospital Stay (HOSPITAL_COMMUNITY): Payer: Medicare Other | Admitting: Speech Pathology

## 2016-01-22 ENCOUNTER — Inpatient Hospital Stay (HOSPITAL_COMMUNITY): Payer: Medicare Other | Admitting: Physical Therapy

## 2016-01-22 LAB — BASIC METABOLIC PANEL
Anion gap: 10 (ref 5–15)
BUN: 27 mg/dL — ABNORMAL HIGH (ref 6–20)
CHLORIDE: 108 mmol/L (ref 101–111)
CO2: 22 mmol/L (ref 22–32)
CREATININE: 0.94 mg/dL (ref 0.44–1.00)
Calcium: 9 mg/dL (ref 8.9–10.3)
Glucose, Bld: 201 mg/dL — ABNORMAL HIGH (ref 65–99)
Potassium: 5 mmol/L (ref 3.5–5.1)
SODIUM: 140 mmol/L (ref 135–145)

## 2016-01-22 LAB — URINALYSIS, ROUTINE W REFLEX MICROSCOPIC
BILIRUBIN URINE: NEGATIVE
Glucose, UA: NEGATIVE mg/dL
Ketones, ur: NEGATIVE mg/dL
NITRITE: POSITIVE — AB
Protein, ur: 100 mg/dL — AB
SPECIFIC GRAVITY, URINE: 1.025 (ref 1.005–1.030)
pH: 5.5 (ref 5.0–8.0)

## 2016-01-22 LAB — URINE MICROSCOPIC-ADD ON: Squamous Epithelial / LPF: NONE SEEN

## 2016-01-22 LAB — GLUCOSE, CAPILLARY
GLUCOSE-CAPILLARY: 161 mg/dL — AB (ref 65–99)
GLUCOSE-CAPILLARY: 249 mg/dL — AB (ref 65–99)
Glucose-Capillary: 224 mg/dL — ABNORMAL HIGH (ref 65–99)
Glucose-Capillary: 211 mg/dL — ABNORMAL HIGH (ref 65–99)

## 2016-01-22 MED ORDER — INSULIN GLARGINE 100 UNIT/ML ~~LOC~~ SOLN
10.0000 [IU] | Freq: Every day | SUBCUTANEOUS | Status: DC
  Administered 2016-01-22: 10 [IU] via SUBCUTANEOUS
  Filled 2016-01-22 (×2): qty 0.1

## 2016-01-22 MED ORDER — INSULIN GLARGINE 100 UNIT/ML ~~LOC~~ SOLN
10.0000 [IU] | Freq: Every day | SUBCUTANEOUS | Status: DC
  Administered 2016-01-24 – 2016-01-27 (×4): 10 [IU] via SUBCUTANEOUS
  Filled 2016-01-22 (×5): qty 0.1

## 2016-01-22 NOTE — Progress Notes (Signed)
Social Work Patient ID: [name] [name], female   DOB: August 12, 1946, 70 y.o.   MRN: [id number]   Have spoken with pt's daughter, [name] [name], this morning to review team conference info and targeted d/c date of 5/23.  Daughter aware and agreeable, however, asking about plan for ortho intervention for pt's foot.  Explained that Dr. Riley Kill does not anticipate anything to be done prior to CIR d/c or immediately following d/c.    Daughter also notes that, while she had stated the plan was for pt's ex-spouse to move in with pt and provide care, she does not believe he will "feel comfortable" providing the direct care that patient obviously needs.  I stressed to her that whoever the caregiver is, he/she must be able to provide very close supervision and some assistance dependent on her alertness.  She plans to discuss with her sister and family today and determine if they can provide the needed care and, if not, then plan will be changed to SNF.  Will keep team posted.  Tymika Grilli, LCSW

## 2016-01-22 NOTE — Progress Notes (Signed)
Occupational Therapy Session Note  Patient Details  Name: [name] [name] MRN: [id number] Date of Birth: 1946-06-22  Today's Date: 01/22/2016 OT Individual Time: 0800-0900 OT Individual Time Calculation (min): 60 min    Short Term Goals: Week 1:  OT Short Term Goal 1 (Week 1): Pt will don shirt with min A. OT Short Term Goal 2 (Week 1): Pt will don pants with mod A. OT Short Term Goal 3 (Week 1): Pt will transfer into shower with min A. OT Short Term Goal 4 (Week 1): Pt will be able to toilet with min A.  Skilled Therapeutic Interventions/Progress Updates:    Pt sitting in w/c eating breakfast upon arrival.  Pt engaged in BADL retraining including toilet transfers, shower transfers, toileting, bathing at shower level, and dressing with sit<>stand from seat.  Pt followed familiar one step commands with approx 75% accuracy but exhibited difficulty with new learning and multiple step requests.  Pt requires min A for bathing/dressing tasks.  Pt completed grooming tasks without assistance.  Focus on activity tolerance, sit<>stand, functional amb with RW, standing balance, task initiation, sequencing, and safety awareness to increase independence with BADLs.   Therapy Documentation Precautions:  Precautions Precautions: Fall Precaution Comments: bilateral DARCO shoes Restrictions Weight Bearing Restrictions: No LLE Weight Bearing: Weight bearing as tolerated  Pain: Pain Assessment Pain Assessment: Faces Faces Pain Scale: Hurts a little bit (hurts with touch) Pain Type: Acute pain Pain Location: Foot Pain Orientation: Right;Left Pain Intervention(s): RN aware Pt also verbalized "pain head" several times; RN aware  ADL: ADL ADL Comments: refer to functional navigator  See Function Navigator for Current Functional Status.   Therapy/Group: Individual Therapy  Rich Brave 01/22/2016, 9:06 AM

## 2016-01-22 NOTE — Progress Notes (Signed)
Occupational Therapy Note  Patient Details  Name: [name] [name] MRN: [id number] Date of Birth: Apr 18, 1946  Today's Date: 01/22/2016 OT Individual Time: 1400-1430 OT Individual Time Calculation (min): 30 min   Pt grimaced when performing sit<>stand but did not verbalize pain; repositioned, emotional support Individual therapy  Pt resting in w/c upon arrival.  Pt engaged in standing activities with focus on following one step commands (25% accuracy), attention to task, and safety awareness with sit<>stand.  Pt remained in w/c with QRB in place and all needs within reach.    Lavone Neri [facility] 01/22/2016, 2:57 PM

## 2016-01-22 NOTE — Progress Notes (Addendum)
[facility] PHYSICAL MEDICINE & REHABILITATION     PROGRESS NOTE    Subjective/Complaints: In bed. No new complaints.    ROS: limited due to aphasia/cognition    Objective: Vital Signs: Blood pressure 140/63, pulse 105, temperature 97.6 F (36.4 C), temperature source Oral, resp. rate 17, height  (1.499 m), weight 33.566 kg (74 lb), SpO2 97 %. No results found.  Recent Labs  01/21/16 1808  WBC 10.6*  HGB 11.9*  HCT 36.2  PLT 154    Recent Labs  01/20/16 0650 01/22/16 0428  NA 140 140  K 5.1 5.0  CL 105 108  GLUCOSE 128* 201*  BUN 26* 27*  CREATININE 0.88 0.94  CALCIUM 8.7* 9.0   CBG (last 3)   Recent Labs  01/21/16 1641 01/21/16 2150 01/22/16 0638  GLUCAP 212* 325* 161*    Wt Readings from Last 3 Encounters:  01/22/16 33.566 kg (74 lb)  01/07/16 36.2 kg (79 lb 12.9 oz)  12/24/15 41.912 kg (92 lb 6.4 oz)    Physical Exam:  Constitutional: She appears well-developed. She appears cachectic.   HENT:  Head: Normocephalic and atraumatic.  Mouth/Throat: poor dentition.   Eyes: Conjunctivae and EOM are normal. Pupils are equal, round, and reactive to light.  Neck: Normal range of motion. Neck supple.  Cardiovascular: An irregularly irregular rhythm present.  Murmur heard. Respiratory: Effort normal and breath sounds normal. No respiratory distress. She has no wheezes.  GI: Soft. Bowel sounds are normal. She exhibits no distension. There is no tenderness.  Musculoskeletal: She exhibits tenderness. She exhibits no edema.  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 Right 1st and 2nd toes ischemic appearing with early blister--painted with betadine.  Pain with ROM left hip    Neurological: She is alert.  Exp>rec aphasia. Word substitution.  Skin: Skin is warm and dry. She is not diaphoretic.  Psychiatric: Her affect is flat         Assessment/Plan: 1. Aphasia, cognitive, and  functional deficits  secondary to TBI which require 3+ hours per day of interdisciplinary therapy in a comprehensive inpatient rehab setting. Physiatrist is providing close team supervision and 24 hour management of active medical problems listed below. Physiatrist and rehab team continue to assess barriers to discharge/monitor patient progress toward functional and medical goals.  Function:  Bathing Bathing position Bathing activity did not occur: Refused Position: Systems developer parts bathed by patient: Right arm, Left arm, Chest, Abdomen, Front perineal area, Right upper leg, Left upper leg Body parts bathed by helper: Right lower leg, Left lower leg, Back, Buttocks  Bathing assist Assist Level: Touching or steadying assistance(Pt > 75%)      Upper Body Dressing/Undressing Upper body dressing   What is the patient wearing?: Button up shirt     Pull over shirt/dress - Perfomed by patient: Thread/unthread right sleeve, Thread/unthread left sleeve, Put head through opening Pull over shirt/dress - Perfomed by helper: Pull shirt over trunk Button up shirt - Perfomed by patient: Thread/unthread right sleeve, Thread/unthread left sleeve, Pull shirt around back, Button/unbutton shirt      Upper body assist Assist Level: Set up, Supervision or verbal cues   Set up : To obtain clothing/put away  Lower Body Dressing/Undressing Lower body dressing   What is the patient wearing?: Non-skid slipper socks, Pants   Underwear - Performed by helper: Thread/unthread right underwear leg, Thread/unthread left underwear leg, Pull underwear up/down  Pants- Performed by patient: Thread/unthread right pants leg, Thread/unthread left pants leg, Pull pants up/down Pants- Performed by helper: Thread/unthread right pants leg, Pull pants up/down   Non-skid slipper socks- Performed by helper: Don/doff right sock, Don/doff left sock       Shoes - Performed by helper: Don/doff right shoe, Don/doff  left shoe, Fasten right, Fasten left          Lower body assist Assist for lower body dressing: Touching or steadying assistance (Pt > 75%)      Toileting Toileting   Toileting steps completed by patient: Performs perineal hygiene Toileting steps completed by helper: Adjust clothing after toileting, Adjust clothing prior to toileting Toileting Assistive Devices: Grab bar or rail  Toileting assist Assist level: Touching or steadying assistance (Pt.75%)   Transfers Chair/bed transfer   Chair/bed transfer method: Ambulatory Chair/bed transfer assist level: Touching or steadying assistance (Pt > 75%) Chair/bed transfer assistive device: Environmental consultant, Designer, fashion/clothing     Max distance: 6 Assist level: Touching or steadying assistance (Pt > 75%)   Wheelchair   Type: Manual Max wheelchair distance: 10 Assist Level: Dependent (Pt equals 0%)  Cognition Comprehension Comprehension assist level: Understands basic 25 - 49% of the time/ requires cueing 50 - 75% of the time  Expression Expression assist level: Expresses basis less than 25% of the time/requires cueing >75% of the time.  Social Interaction Social Interaction assist level: Interacts appropriately 25 - 49% of time - Needs frequent redirection.  Problem Solving Problem solving assist level: Solves basic less than 25% of the time - needs direction nearly all the time or does not effectively solve problems and may need a restraint for safety  Memory Memory assist level: Recognizes or recalls less than 25% of the time/requires cueing greater than 75% of the time   Medical Problem List and Plan: 1. Aphasia, gait disorder, cognitive deficits secondary to Left subdural hematoma, left superior inferior pubic ramus fracture status post fall  -continue CIR therapies. Clinical appearance can wax and wane 2. DVT Prophylaxis/Anticoagulation: Mechanical: Sequential compression devices, below knee Bilateral lower  extremities 3. Pain Management: comfortable at present  -follow as she works with therapy 4. Mood: LCSW to follow for evaluation and support.  5. Neuropsych: This patient is not capable of making decisions on her own behalf.  6. Skin/Wound Care: Prevalon boots bilateral feet--elevate when in bed chair. Needs to have shoes on when out of bed.  7. Fluids/Electrolytes/Nutrition: Monitor I/O. Needs assistance with meals.   -potassium at high end of normal now--- can back off slightly  -I personally reviewed the patient's labs today. Encourage fluids 8. A fib with severe mitral stenosis and symptomatic AS:  Monitor for CP or orthostatic symptoms.  9. DM type 2: monitor BS ac/hs.     -patient eating more now.---increase insulin in am and pm, 10u and 15u respectively   10. Malnutrition:  protein supplement/encourage po    11. CKD s/p renal transplant: ON cyclosporine, prednisone and cellcept.  12. PAD with claudication and gangrenous change left toes: conservative care for now---will need amputation at some point 13. HTN: Monitor BP bid. Continue norvasc 14. New onset seizures: On keppra 750mg  bid     LOS (Days) 6 A FACE TO FACE EVALUATION WAS PERFORMED  SWARTZ,ZACHARY T 01/22/2016 8:43 AM

## 2016-01-22 NOTE — Progress Notes (Signed)
Speech Language Pathology Daily Session Note  Patient Details  Name: [name] [name] MRN: [id number] Date of Birth: [birth year]  Today's Date: 01/22/2016 SLP Individual Time: 1000-1100 SLP Individual Time Calculation (min): 60 min  Short Term Goals: Week 1: SLP Short Term Goal 1 (Week 1): Pt to communicate basic needs via any means with mod A. SLP Short Term Goal 2 (Week 1): Pt able to 1-step directives with mod A. SLP Short Term Goal 3 (Week 1): Pt able to produce single words with mod A for compensatory strategies- semantic cueing, sentence completion, etc. SLP Short Term Goal 4 (Week 1): Pt able to maintain attention to task for 5 minutes with mod A. SLP Short Term Goal 5 (Week 1): Pt able to demonstrate yes/no response with 70% acc with max A.  Skilled Therapeutic Interventions: Pt seen up in WC. Hardworking and labile requiring frequent redirection and encouragement. Automatic speech: counting 1-10 mod I, days of week 5/7 with mod A. Yes/No- 2/5 acc- all yes responses except 1 "no" despite max cueing of visual and verbal cueing. However, in 2/3 questions when "no" was the appropriate answer, pt shook her head "no" but verbalized yes. Labeling common pictures: 3/10 mod I, then additional 6 with phonemic/semantic cueing. Reading names from envelopes 100% acc. 1-step directives 100% acc. Picture identification in a field of 2, 80% acc. Pt able to count simple coins with min -mod A. Writing- copied name with 80% acc. Mod A to shift vision to R and maintain attention to engage in tasks.     Function:  Eating Eating                 Cognition Comprehension Comprehension assist level: Understands basic 25 - 49% of the time/ requires cueing 50 - 75% of the time  Expression   Expression assist level: Expresses basis less than 25% of the time/requires cueing >75% of the time.  Social Interaction Social Interaction assist level: Interacts appropriately 25 - 49% of time - Needs frequent  redirection.  Problem Solving Problem solving assist level: Solves basic less than 25% of the time - needs direction nearly all the time or does not effectively solve problems and may need a restraint for safety  Memory Memory assist level: Recognizes or recalls less than 25% of the time/requires cueing greater than 75% of the time    Pain Pain Assessment Pain Assessment: No/denies pain Faces Pain Scale: No hurt  Therapy/Group: Individual Therapy  Rocky Crafts MA, CCC-SLP 01/22/2016, 1:23 PM

## 2016-01-22 NOTE — Progress Notes (Signed)
Physical Therapy Session Note  Patient Details  Name: [name] [name] MRN: [id number] Date of Birth: [birth year]  Today's Date: 01/22/2016 PT Individual Time: 0900-1000 and 1305-1330 PT Individual Time Calculation (min): 60 min and 25 min  Short Term Goals: Week 1:  PT Short Term Goal 1 (Week 1): Patient will tolerate standing x 3 min while engaging in functional task.  PT Short Term Goal 2 (Week 1): Patient will retrieve object from floor with min A.  PT Short Term Goal 3 (Week 1): Patient will negotiate up/down 4 stairs using 2 rails with min A.  PT Short Term Goal 4 (Week 1): Patient will ambulate 50 ft using RW with steady assist.  PT Short Term Goal 5 (Week 1): Patient will perform bed mobility with supervision.   Skilled Therapeutic Interventions/Progress Updates:   Treatment 1: Patient sitting in wheelchair upon arrival. Patient required max verbal/tactile cues and more than reasonable time to engage with therapist. Eventually patient performed sit > stand from wheelchair with min A and ambulated x 75 ft using RW with close supervision and max verbal cues/assist for keeping RW closer to body for safety and variable gait speed noted, stopping frequently and then speeding up suddenly. Gait training short distances in gym with supervision-min A for turning to safely back up to seat before sitting. Patient requires verbal cues and HOH assist for safe hand placement with sit <> stand. Engaged in seated functional task of folding towels with more than reasonable time to complete task, patient crying when asked to count towels with therapist. Performed simple numerical and color sorting tasks from seated position with max-total multimodal cues. Patient with noted R inattention to visual field as she was able to accurately identify numbers by pointing when placed on L but inaccurate when number placed onR. Stair training up/down 3 (6") stairs using 2 rails with mod A overall and verbal/tactile cues  for sequencing leading with RLE, ascending forward and descending backward. Seated EOM, engaged in ball toss with focus on sitting balance and counting task. Patient labile throughout session and provided rest breaks throughout in effort to increase overall participation. Patient left sitting in wheelchair with quick release belt on and call bell in lap to await SLP session.    Treatment 2: Patient in wheelchair with church friend departing. Patient crying upon friend exiting but agreeable to toileting. Patient performed sit <> stand from wheelchair with min A and HOH assist for safe hand placement. Gait training from wheelchair > toilet > sink > wheelchair using RW with max cues for keeping RW closer to body for safety and S-min A overall. Patient required cues to initiate clothing management, hygiene, and hand hygiene standing at sink. Instructed in seated LAQ and hip flexion 2 x 10 each LE with verbal cues and demonstration and patient able to count out loud intermittently. Left sitting in wheelchair with quick release belt on and needs in reach.   Therapy Documentation Precautions:  Precautions Precautions: Fall Precaution Comments: bilateral DARCO shoes Restrictions Weight Bearing Restrictions: No LLE Weight Bearing: Weight bearing as tolerated Pain: Pain Assessment Pain Assessment: Faces Faces Pain Scale: Hurts a little bit (hurts with touch) Pain Type: Acute pain Pain Location: Foot Pain Orientation: Right;Left Pain Intervention(s): rest provided  See Function Navigator for Current Functional Status.   Therapy/Group: Individual Therapy  Kerney Elbe 01/22/2016, 10:02 AM

## 2016-01-22 NOTE — Patient Care Conference (Signed)
Inpatient RehabilitationTeam Conference and Plan of Care Update Date: 01/21/2016   Time: 2:30 PM    Patient Name: [name] [name]      Medical Record Number: [id number]  Date of Birth: 05/26/1946 Sex: Female         Room/Bed: 4W10C/4W10C-01 Payor Info: Payor: MEDICARE / Plan: MEDICARE PART A AND B / Product Type: *No Product type* /    Admitting Diagnosis: TBI  Admit Date/Time:  01/16/2016  4:06 PM Admission Comments: No comment available   Primary Diagnosis:  <principal problem not specified> Principal Problem: <principal problem not specified>  Patient Active Problem List   Diagnosis Date Noted  . TBI (traumatic brain injury) (HCC) 01/16/2016  . Protein-calorie malnutrition, severe 01/14/2016  . SAH (subarachnoid hemorrhage) (HCC)   . Benign essential HTN   . Seizures (HCC) 01/13/2016  . Anticoagulated on Coumadin   . Subdural hematoma (HCC)   . Chronic atrial fibrillation (HCC)   . IDDM (insulin dependent diabetes mellitus) (HCC)   . History of renal transplant   . PVD (peripheral vascular disease) (HCC)   . Ischemic ulcer of both feet (HCC)   . SDH (subdural hematoma) (HCC) 01/07/2016  . Type II diabetes mellitus (HCC) 11/23/2015  . Hypoglycemia 11/23/2015  . Critical lower limb ischemia 11/20/2015  . Encounter for therapeutic drug monitoring 10/06/2013  . Colon cancer screening 06/01/2012  . 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 PRIMARY HYPERPARATHYROIDISM 08/06/2010  . ANEMIA 08/06/2010  . DEPRESSION 08/06/2010  . Mitral stenosis 08/06/2010  . Atrial fibrillation (HCC) 08/06/2010  . 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 DIASTOLIC DYSFUNCTION 08/06/2010  . Cerebral artery occlusion with cerebral infarction (HCC) 08/06/2010  . PVD 08/06/2010  . RENAL FAILURE, END STAGE 08/06/2010  . PACEMAKER, PERMANENT 08/06/2010    Expected Discharge Date: Expected Discharge Date: 01/28/16  Team Members Present: Physician leading conference: Dr. Faith Rogue Social Worker Present:  Amada Jupiter, LCSW Nurse Present: Ronny Bacon, RN PT Present: Bayard Hugger, PT OT Present: Ardis Rowan, COTA;Jennifer Katrinka Blazing, OT SLP Present: Feliberto Gottron, SLP PPS Coordinator present : Tora Duck, RN, CRRN     Current Status/Progress Goal Weekly Team Focus  Medical   TBI---improved arousal. still with profound language deficits. working on nutrition and DM control  consistent arousal and engagement  nutrition, dm control,    Bowel/Bladder   incontinent at times of bladder, continent of bowel  mod I assist with transfer to the toilet  less incontinent episodes   Swallow/Nutrition/ Hydration   Dys. 3 textures with thin liquids, full supervision  Mod I with least restrictive diet  Increased utilization of swallowing compensatory strategies   ADL's   bathing-mod A; dressing-mod A; functional tranfsers-min A; max encouragement for participation; max verbal cues for task initiation  supervision overall  cognitive remeidation, BADL retraining, activity tolerance   Mobility   supervision-min A overall, max encouragement to participate  supervision  functional mobility training, activity tolerance, cognitive remediation   Communication   Max-Total A  Mod A   expression of wants/neeeds, yes/no accuracy    Safety/Cognition/ Behavioral Observations  Max A  Mod A  attention, problem solving, orientation    Pain   patient can not express pain at this time  pain scale less than 4/10  pain meds are now scheduled for comfort, continue to use other ways to detect pain   Skin   necrotic looking toes  patient will have no signs & symptoms of infection  treat as ordered & continue to monitor  Rehab Goals Patient on target to meet rehab goals: Yes *See Care Plan and progress notes for long and short-term goals.  Barriers to Discharge: language/neurological deficits.     Possible Resolutions to Barriers:  will need 24 hr sup at home, ongoing cognitive remediation, normalizing sleep cycle     Discharge Planning/Teaching Needs:  Daughter reports plan is for pt's ex-husband to come and stay to provide 24/7 supervision  to be scheduled.   Team Discussion:  Making some improvements in communication/ cognition.  Currently supervision - min assist but total assistance for safety monitoring and cueing.  Supervision goals.  MD does not anticipate ortho intervention on foot prior to d/c from CIR.   SW to confirm dc plan with daughter now that dc date set  Revisions to Treatment Plan:  None   Continued Need for Acute Rehabilitation Level of Care: The patient requires daily medical management by a physician with specialized training in physical medicine and rehabilitation for the following conditions: Daily direction of a multidisciplinary physical rehabilitation program to ensure safe treatment while eliciting the highest outcome that is of practical value to the patient.: Yes Daily medical management of patient stability for increased activity during participation in an intensive rehabilitation regime.: Yes Daily analysis of laboratory values and/or radiology reports with any subsequent need for medication adjustment of medical intervention for : Neurological problems;Diabetes problems  [name] [name] 01/22/2016, 11:09 AM

## 2016-01-22 NOTE — Progress Notes (Signed)
Speech Language Pathology Daily Session Note  Patient Details  Name: [name] [name] MRN: [id number] Date of Birth: 09/25/1945  Today's Date: 01/22/2016 SLP Individual Time: 1505-1530 SLP Individual Time Calculation (min): 25 min  Short Term Goals: Week 1: SLP Short Term Goal 1 (Week 1): Pt to communicate basic needs via any means with mod A. SLP Short Term Goal 2 (Week 1): Pt able to 1-step directives with mod A. SLP Short Term Goal 3 (Week 1): Pt able to produce single words with mod A for compensatory strategies- semantic cueing, sentence completion, etc. SLP Short Term Goal 4 (Week 1): Pt able to maintain attention to task for 5 minutes with mod A. SLP Short Term Goal 5 (Week 1): Pt able to demonstrate yes/no response with 70% acc with max A.  Skilled Therapeutic Interventions:  Skilled treatment session focused on cognitive-linguistic goals. Upon arrival, patient was sitting upright in the wheelchair and began to cry when the clinician entered the room. As clinician was taking the patient to the SLP office, patient was yelling out "tomorrow" and "have mercy." Clinician gave the patient ~2 minutes for a "rest break" in order to maximize the patient's participation. Patient answered all question in regards to her wants/needs and biographical information with "yes" despite Max A multimodal cues and named objects/actions from pictures with Max A semantic, visual and phonemic cues. Patient transferred to bed at end of session and left with all needs within reach. Continue with current plan of care.    Function:   Cognition Comprehension Comprehension assist level: Understands basic 25 - 49% of the time/ requires cueing 50 - 75% of the time  Expression   Expression assist level: Expresses basis less than 25% of the time/requires cueing >75% of the time.  Social Interaction Social Interaction assist level: Interacts appropriately 25 - 49% of time - Needs frequent redirection.  Problem  Solving Problem solving assist level: Solves basic less than 25% of the time - needs direction nearly all the time or does not effectively solve problems and may need a restraint for safety  Memory Memory assist level: Recognizes or recalls less than 25% of the time/requires cueing greater than 75% of the time    Pain Pain Assessment Pain Assessment: No/denies pain  Therapy/Group: Individual Therapy  Aline Wesche 01/22/2016, 5:13 PM

## 2016-01-23 ENCOUNTER — Inpatient Hospital Stay (HOSPITAL_COMMUNITY): Payer: Medicare Other | Admitting: Speech Pathology

## 2016-01-23 ENCOUNTER — Inpatient Hospital Stay (HOSPITAL_COMMUNITY): Payer: Medicare Other

## 2016-01-23 ENCOUNTER — Inpatient Hospital Stay (HOSPITAL_COMMUNITY): Payer: Medicare Other | Admitting: Physical Therapy

## 2016-01-23 LAB — GLUCOSE, CAPILLARY
GLUCOSE-CAPILLARY: 45 mg/dL — AB (ref 65–99)
GLUCOSE-CAPILLARY: 91 mg/dL (ref 65–99)
GLUCOSE-CAPILLARY: 216 mg/dL — AB (ref 65–99)
GLUCOSE-CAPILLARY: 328 mg/dL — AB (ref 65–99)
GLUCOSE-CAPILLARY: 198 mg/dL — AB (ref 65–99)
Glucose-Capillary: 41 mg/dL — CL (ref 65–99)

## 2016-01-23 MED ORDER — INSULIN ASPART 100 UNIT/ML ~~LOC~~ SOLN
0.0000 [IU] | Freq: Three times a day (TID) | SUBCUTANEOUS | Status: DC
  Administered 2016-01-23: 7 [IU] via SUBCUTANEOUS
  Administered 2016-01-24 (×2): 5 [IU] via SUBCUTANEOUS
  Administered 2016-01-25: 1 [IU] via SUBCUTANEOUS
  Administered 2016-01-25: 2 [IU] via SUBCUTANEOUS
  Administered 2016-01-26: 9 [IU] via SUBCUTANEOUS
  Administered 2016-01-26: 5 [IU] via SUBCUTANEOUS
  Administered 2016-01-27: 3 [IU] via SUBCUTANEOUS
  Administered 2016-01-27: 1 [IU] via SUBCUTANEOUS
  Administered 2016-01-28 (×2): 2 [IU] via SUBCUTANEOUS
  Administered 2016-01-28: 3 [IU] via SUBCUTANEOUS
  Administered 2016-01-29: 5 [IU] via SUBCUTANEOUS
  Administered 2016-01-29 (×2): 1 [IU] via SUBCUTANEOUS

## 2016-01-23 MED ORDER — INSULIN GLARGINE 100 UNIT/ML ~~LOC~~ SOLN
5.0000 [IU] | Freq: Every day | SUBCUTANEOUS | Status: DC
  Administered 2016-01-23 – 2016-01-24 (×2): 5 [IU] via SUBCUTANEOUS
  Filled 2016-01-23 (×3): qty 0.05

## 2016-01-23 MED ORDER — INSULIN ASPART 100 UNIT/ML ~~LOC~~ SOLN: 0.0000 [IU] | Freq: Every day | SUBCUTANEOUS | Status: DC

## 2016-01-23 MED ORDER — GLUCOSE 40 % PO GEL
ORAL | Status: AC
  Administered 2016-01-23: 1
  Filled 2016-01-23: qty 1

## 2016-01-23 NOTE — Progress Notes (Signed)
Social Work Patient ID: [name] [name], female   DOB: 1946/06/19, 70 y.o.   MRN: [id number]  Spoke today with pt's daughter, Lowella Bandy, who confirms that family is unable to provide 24/7 care for pt and they wish to pursue SNF placement.  She also is asking about plans for ortho/ amputation and "can't we just send her straight to [facility] (hospital) for the surgery before the nursing home?"  Have left daughter's contact information with Marissa Nestle, PA and have asked her to follow up on this question and let daughter know.  I will proceed with bed search.  Cayton Cuevas, LCSW

## 2016-01-23 NOTE — Progress Notes (Signed)
Occupational Therapy Note  Patient Details  Name: [name] [name] MRN: [id number] Date of Birth: 11/30/1945  Today's Date: 01/23/2016 OT Individual Time: 1400-1430 OT Individual Time Calculation (min): 30 min   Pt grimaced with activity/movement of BLE; repositioned and emotional support provided Individual Therapy  Pt asleep in bed upon arrival and required min multimodal cues to arouse.  Pt required min encouragement to sit EOB, amb with RW to bathroom, stand at sink to wash hands, and engaged in static standing activities.  Pt remained in w/c with QRB in place and all needs within reach upon end of session and stated "hot" X 3 but nodded affirmatively when asked if she felt better with covers removed.    Lavone Neri [facility] 01/23/2016, 2:40 PM

## 2016-01-23 NOTE — Progress Notes (Signed)
Physical Therapy Weekly Progress Note  Patient Details  Name: [name] [name] MRN: [id number] Date of Birth: 11-18-1945  Beginning of progress report period: Jan 17, 2016 End of progress report period: Jan 23, 2016  Today's Date: 01/23/2016 PT Individual Time: 0905-0958 PT Individual Time Calculation (min): 53 min   Patient has met 3 of 5 short term goals. Patient's progress this report period has been slow and inconsistent. Patient continues to require close supervision-min A for functional mobility using RW with max encouragement to participate in therapies with decreased safety awareness and requires max-total cues to safely complete functional and familiar therapeutic tasks. Patient's family will not be able to provide level of physical and cognitive assistance necessary at discharge, therefore DC plan changed to SNF.   Patient continues to demonstrate the following deficits: muscle weakness, decreased endurance, decreased attention to right, decreased attention, decreased awareness, decreased problem solving, decreased safety awareness, decreased memory, delayed processing, decreased functional communication, decreased standing balance, and decreased balance strategies and therefore will continue to benefit from skilled PT intervention to enhance overall performance with activity tolerance, balance, postural control, ability to compensate for deficits, attention and awareness.  Patient not progressing toward long term goals.  See goal revision.  Plan of care revisions: Goals downgraded to supervision for bed mobility, ambulation distance downgraded, and dynamic standing balance downgraded to min A. Furniture, home ambulation, and stair goals DC'd due to DC plan changed to SNF.  PT Short Term Goals Week 1:  PT Short Term Goal 1 (Week 1): Patient will tolerate standing x 3 min while engaging in functional task.  PT Short Term Goal 2 (Week 1): Patient will retrieve object from floor with min  A.  PT Short Term Goal 3 (Week 1): Patient will negotiate up/down 4 stairs using 2 rails with min A.  PT Short Term Goal 4 (Week 1): Patient will ambulate 50 ft using RW with steady assist.  PT Short Term Goal 5 (Week 1): Patient will perform bed mobility with supervision.   Skilled Therapeutic Interventions/Progress Updates:   Patient in wheelchair upon arrival. In quiet environment, engaged patient in basic automatic counting task with min cues to attend to task. Patient engaged in functional reaching task from wheelchair level using BUE via [employer] mode A lower quadrants only first 3 rings with initial HOH assist on first trial progressed to max verbal/visual/auditory cues on second trial. Patient hitting buttons at random (not just red light) with noted R inattention requiring max-total cues. Patient required 2 attempts with sit <> stand from wheelchair with min A to maintain standing for gait training using RW x 50 ft with min A to continue task and max cuing for safe use of RW and keeping RW closer to body. Patient performed ambulatory transfers using RW with min A and max multimodal cues for safety. Performed simulated car transfer to sedan height using RW with min A, patient able to bring BLE in/out of car independently. In day room, patient stood and wiped down tables x 4 min with min A for balance during side stepping. Patient crying throughout session, stating, "Tired, tired, tired," and required max encouragement and physical assist to engage in all functional tasks. Patient left sitting in wheelchair, handoff to SLP.    Therapy Documentation Precautions:  Precautions Precautions: Fall Precaution Comments: bilateral DARCO shoes Restrictions Weight Bearing Restrictions: No LLE Weight Bearing: Weight bearing as tolerated Pain: Pain Assessment Pain Assessment: Faces Faces Pain Scale: Hurts little more Pain Type: Acute pain  Pain Location: Leg Pain Orientation: Right;Left Pain  Descriptors / Indicators: Grimacing;Moaning Pain Onset: With Activity Pain Intervention(s): Rest;Distraction   See Function Navigator for Current Functional Status.  Therapy/Group: Individual Therapy  Laretta Alstrom 01/23/2016, 12:33 PM

## 2016-01-23 NOTE — Progress Notes (Signed)
Nutrition Follow-up  DOCUMENTATION CODES:   Severe malnutrition in context of chronic illness, Underweight  INTERVENTION:  -Continue Glucerna Shake po TID, each supplement provides 220 kcal and 10 grams of protein -Continue Magic cup TID with meals, each supplement provides 290 kcal and 9 grams of protein  NUTRITION DIAGNOSIS:   Malnutrition related to chronic illness as evidenced by severe depletion of muscle mass, severe depletion of body fat. -ongoing  GOAL:   Patient will meet greater than or equal to 90% of their needs -progressing  MONITOR:   PO intake, Supplement acceptance, Labs, Weight trends, Skin, I & O's  ASSESSMENT:   Pt with history fo HTN, CVA, severe mitral stenosis, and symptomatic AS, T2DM, CKD s/p renal transplant, PAF-on coumadin, gait disorder, PAD with severe claudication LLE and bilateral foot ulcers (with plans for partial foot amputation next week) who was admitted on 01/07/16 due to fall out of bed--she struck her head and EMS called but patient refused to go to the hospital till later that evening due to inability to get out of bed.   Ms. Anspaugh is still confused, answers "yes" to virtually every question. Per RN She is doing ok with trays, usually consuming at least half or more.  With [employer] and Glucerna it is "hit or miss" pt may take a few sips an be done or may consume the entire supplement. Documented PO intake averaging 55% Pt exhibits a 5#/6.3% severe wt loss in 2 weeks between hospital and rehab admission.   Will continue to monitor for PO intake, wt trends  Labs and Medications reviewed: KCL 20 mEq; Vit D 50,000 IU; Fe 150mg  Daily; Senokot CBGs 45-216; Phos 5.1; Mg 1.4  Diet Order:  DIET DYS 3 Room service appropriate?: Yes; Fluid consistency:: Thin  Skin:  Wound (see comment) (Pu on toes, diabetic PU on ankle)  Last BM:  5/10  Height:   Ht Readings from Last 1 Encounters:  01/16/16 4\' 11"  (1.499 m)    Weight:   Wt  Readings from Last 1 Encounters:  01/23/16 74 lb 8.3 oz (33.8 kg)    Ideal Body Weight:  50 kg  BMI:  Body mass index is 15.04 kg/(m^2).  Estimated Nutritional Needs:   Kcal:  1500-1700   Protein:  75-85  Fluid:  1.5 L  EDUCATION NEEDS:   No education needs identified at this time  Dionne Ano. Fumie Fiallo, MS, RD LDN Inpatient Clinical Dietitian Pager [phone]

## 2016-01-23 NOTE — Progress Notes (Signed)
Occupational Therapy Session Note  Patient Details  Name: [name] [name] MRN: [id number] Date of Birth: [birth year]  Today's Date: 01/23/2016 OT Individual Time: 0800-0900 OT Individual Time Calculation (min): 60 min    Short Term Goals: Week 1:  OT Short Term Goal 1 (Week 1): Pt will don shirt with min A. OT Short Term Goal 2 (Week 1): Pt will don pants with mod A. OT Short Term Goal 3 (Week 1): Pt will transfer into shower with min A. OT Short Term Goal 4 (Week 1): Pt will be able to toilet with min A.  Skilled Therapeutic Interventions/Progress Updates:    Pt asleep in bed upon arrival and initially did not respond to multimodal cues to arouse.  Pt required more than a reasonable amount of time to initiate and complete transitional movements this morning.  Pt amb with RW to bathroom to use toilet.  Pt attempted to sit on toilet before doffing pants and required tot A to doff/don pants and perform toilet hygiene.  Pt stood at sink to wash hands before returning to w/c.  Pt required tot A this morning to doff pajamas and don clean pajamas.  After donning button up shirt, pt sat in w/c without attempting to button up.  Focus on activity tolerance, active participation, sit<>stand, standing balance, functional amb with RW, and safety awareness to increase independence with BADLs and reduce burden of care.   Therapy Documentation Precautions:  Precautions Precautions: Fall Precaution Comments: bilateral DARCO shoes Restrictions Weight Bearing Restrictions: No LLE Weight Bearing: Weight bearing as tolerated  Pain: Pain Assessment Pain Assessment: Faces Faces Pain Scale: Hurts little more Pain Type: Acute pain Pain Location: Leg Pain Orientation: Right;Left Pain Descriptors / Indicators: Grimacing;Moaning Pain Onset: With Activity Pain Intervention(s): Rest;Distraction ADL: ADL ADL Comments: refer to functional navigator  See Function Navigator for Current Functional  Status.   Therapy/Group: Individual Therapy  Rich Brave 01/23/2016, 10:01 AM

## 2016-01-23 NOTE — Progress Notes (Signed)
PHYSICAL MEDICINE & REHABILITATION     PROGRESS NOTE    Subjective/Complaints: In bed. Had sheets over her head when i arrived    ROS: limited due to aphasia/cognition    Objective: Vital Signs: Blood pressure 142/81, pulse 56, temperature 97.5 F (36.4 C), temperature source Oral, resp. rate 17, height  (1.499 m), weight 33.8 kg (74 lb 8.3 oz), SpO2 100 %. No results found.  Recent Labs  01/21/16 1808  WBC 10.6*  HGB 11.9*  HCT 36.2  PLT 154    Recent Labs  01/22/16 0428  NA 140  K 5.0  CL 108  GLUCOSE 201*  BUN 27*  CREATININE 0.94  CALCIUM 9.0   CBG (last 3)   Recent Labs  01/23/16 0641 01/23/16 0659 01/23/16 0721  GLUCAP 41* 45* 91    Wt Readings from Last 3 Encounters:  01/23/16 33.8 kg (74 lb 8.3 oz)  01/07/16 36.2 kg (79 lb 12.9 oz)  12/24/15 41.912 kg (92 lb 6.4 oz)    Physical Exam:  Constitutional: She appears well-developed. She appears cachectic.   HENT:  Head: Normocephalic and atraumatic.  Mouth/Throat: poor dentition.   Eyes: Conjunctivae and EOM are normal. Pupils are equal, round, and reactive to light.  Neck: Normal range of motion. Neck supple.  Cardiovascular: An irregularly irregular rhythm present.  Murmur heard. Respiratory: Effort normal and breath sounds normal. No respiratory distress. She has no wheezes.  GI: Soft. Bowel sounds are normal. She exhibits no distension. There is no tenderness.  Musculoskeletal: She exhibits tenderness. She exhibits no edema.  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 Right 1st and 2nd toes ischemic appearing with early blister--painted with betadine.  Pain with ROM left hip    Neurological: She is alert.  Exp>rec aphasia. Word substitution.  Skin: Skin is warm and dry. She is not diaphoretic.  Psychiatric: Her affect is flat         Assessment/Plan: 1. Aphasia, cognitive, and functional  deficits  secondary to TBI which require 3+ hours per day of interdisciplinary therapy in a comprehensive inpatient rehab setting. Physiatrist is providing close team supervision and 24 hour management of active medical problems listed below. Physiatrist and rehab team continue to assess barriers to discharge/monitor patient progress toward functional and medical goals.  Function:  Bathing Bathing position Bathing activity did not occur: Refused Position: Shower  Bathing parts Body parts bathed by patient: Right arm, Left arm, Chest, Abdomen, Front perineal area, Right upper leg, Left upper leg, Buttocks Body parts bathed by helper: Right lower leg, Left lower leg  Bathing assist Assist Level: Touching or steadying assistance(Pt > 75%)      Upper Body Dressing/Undressing Upper body dressing   What is the patient wearing?: Button up shirt, Bra Bra - Perfomed by patient: Thread/unthread right bra strap, Thread/unthread left bra strap Bra - Perfomed by helper: Hook/unhook bra (pull down sports bra) Pull over shirt/dress - Perfomed by patient: Thread/unthread right sleeve, Thread/unthread left sleeve, Put head through opening Pull over shirt/dress - Perfomed by helper: Pull shirt over trunk Button up shirt - Perfomed by patient: Thread/unthread right sleeve, Thread/unthread left sleeve, Pull shirt around back, Button/unbutton shirt      Upper body assist Assist Level: Touching or steadying assistance(Pt > 75%)   Set up : To obtain clothing/put away  Lower Body Dressing/Undressing Lower body dressing   What is the patient wearing?: Pants, Non-skid  slipper socks, Underwear, Shoes   Underwear - Performed by helper: Thread/unthread right underwear leg, Thread/unthread left underwear leg, Pull underwear up/down Pants- Performed by patient: Thread/unthread right pants leg, Thread/unthread left pants leg, Pull pants up/down Pants- Performed by helper: Thread/unthread right pants leg, Pull pants  up/down Non-skid slipper socks- Performed by patient: Don/doff right sock, Don/doff left sock Non-skid slipper socks- Performed by helper: Don/doff right sock, Don/doff left sock       Shoes - Performed by helper: Don/doff right shoe, Don/doff left shoe, Fasten right, Fasten left          Lower body assist Assist for lower body dressing: Touching or steadying assistance (Pt > 75%)      Toileting Toileting   Toileting steps completed by patient: Adjust clothing prior to toileting, Performs perineal hygiene Toileting steps completed by helper: Adjust clothing after toileting Toileting Assistive Devices: Grab bar or rail  Toileting assist Assist level: Touching or steadying assistance (Pt.75%)   Transfers Chair/bed transfer   Chair/bed transfer method: Ambulatory Chair/bed transfer assist level: Touching or steadying assistance (Pt > 75%) Chair/bed transfer assistive device: Environmental consultant, Designer, fashion/clothing     Max distance: 75 Assist level: Touching or steadying assistance (Pt > 75%)   Wheelchair   Type: Manual Max wheelchair distance: 10 Assist Level: Dependent (Pt equals 0%)  Cognition Comprehension Comprehension assist level: Understands basic 25 - 49% of the time/ requires cueing 50 - 75% of the time  Expression Expression assist level: Expresses basis less than 25% of the time/requires cueing >75% of the time.  Social Interaction Social Interaction assist level: Interacts appropriately 25 - 49% of time - Needs frequent redirection.  Problem Solving Problem solving assist level: Solves basic less than 25% of the time - needs direction nearly all the time or does not effectively solve problems and may need a restraint for safety  Memory Memory assist level: Recognizes or recalls less than 25% of the time/requires cueing greater than 75% of the time   Medical Problem List and Plan: 1. Aphasia, gait disorder, cognitive deficits secondary to Left subdural  hematoma, left superior inferior pubic ramus fracture status post fall  -continue CIR therapies. Clinical appearance does wax and wane 2. DVT Prophylaxis/Anticoagulation: Mechanical: Sequential compression devices, below knee Bilateral lower extremities 3. Pain Management: comfortable at present  -follow as she works with therapy 4. Mood: LCSW to follow for evaluation and support.  5. Neuropsych: This patient is not capable of making decisions on her own behalf.  6. Skin/Wound Care: Prevalon boots bilateral feet--elevate when in bed chair. Needs to have shoes on when out of bed.  7. Fluids/Electrolytes/Nutrition: Monitor I/O. Needs assistance with meals.   -need to push fluids 8. A fib with severe mitral stenosis and symptomatic AS:  Monitor for CP or orthostatic symptoms.  9. DM type 2: monitor BS ac/hs.  Hypoglycemic again today---reduce PM lantus and increase AM lantus   -5u pm and 10u am   10. Malnutrition:  protein supplement/encourage po    11. CKD s/p renal transplant: ON cyclosporine, prednisone and cellcept.  12. PAD with claudication and gangrenous change left toes: conservative care for now---will need amputation at some point 13. HTN: Monitor BP bid. Continue norvasc 14. New onset seizures: On keppra 750mg  bid     LOS (Days) 7 A FACE TO FACE EVALUATION WAS PERFORMED  Rilan Eiland T 01/23/2016 8:25 AM

## 2016-01-23 NOTE — Plan of Care (Signed)
Problem: RH Ambulation Goal: LTG Patient will ambulate in home environment (PT) LTG: Patient will ambulate in home environment, # of feet with assistance (PT).  Outcome: Not Applicable Date Met:  01/23/16 DC plan changed to SNF     

## 2016-01-23 NOTE — Progress Notes (Signed)
Physical Therapy Session Note  Patient Details  Name: [name] [name] MRN: [id number] Date of Birth: [birth year]  Today's Date: 01/23/2016 PT Individual Time: 4098-1191 PT Individual Time Calculation (min): 53 min   Short Term Goals: Week 1:  PT Short Term Goal 1 (Week 1): Patient will tolerate standing x 3 min while engaging in functional task.  PT Short Term Goal 2 (Week 1): Patient will retrieve object from floor with min A.  PT Short Term Goal 3 (Week 1): Patient will negotiate up/down 4 stairs using 2 rails with min A.  PT Short Term Goal 4 (Week 1): Patient will ambulate 50 ft using RW with steady assist.  PT Short Term Goal 5 (Week 1): Patient will perform bed mobility with supervision.   Skilled Therapeutic Interventions/Progress Updates:   Pt up in w/c with family present who observed therapy session. Focused on functional mobility with RW in the apartment and functional balance tasks while addressing cognition. Pt adjusted pillows on bed, put pillow cases on pillows, hung clothing in closet, and performed various transfers and bed mobility. Pt required close supervision to steadying assist with these tasks and mod verbal cues for initiation, sequencing, safety with RW, and attention. Pt verbalizing throughout how tired she was and required supine rest break on apartment bed x 2. At end of session returned to room and transferred back to bed and positioned in supine with all needs in reach.   Therapy Documentation Precautions:  Precautions Precautions: Fall Precaution Comments: bilateral DARCO shoes Restrictions Weight Bearing Restrictions: No LLE Weight Bearing: Weight bearing as tolerated  Pain:  Did not report pain.     See Function Navigator for Current Functional Status.   Therapy/Group: Individual Therapy  Karolee Stamps Darrol Poke, PT, DPT  01/23/2016, 3:54 PM

## 2016-01-23 NOTE — Plan of Care (Signed)
Pt's plan of care adjusted to 3 hours daily therapies after speaking with treatment team as pt unable to tolerate current therapy schedule with OT, PT, and SLP.

## 2016-01-23 NOTE — Progress Notes (Signed)
Hypoglycemic Event  CBG: 41  Treatment: 15 GM gel  Symptoms: None  Follow-up CBG: Time:0659 CBG Result:45  Possible Reasons for Event: Unknown  Comments/MD notified:Dan A, PA--Pt received another 4 oz cup of apple juice after checking CBG the second time. MD made aware no orders given. Will past on info to oncoming RN   Learta Codding

## 2016-01-23 NOTE — Progress Notes (Signed)
Speech Language Pathology Daily Session Note  Patient Details  Name: [name] [name] MRN: [id number] Date of Birth: [birth year]  Today's Date: 01/23/2016 SLP Individual Time: 1000-1046 SLP Individual Time Calculation (min): 46 min  Missed Time: 14 minutes, fatigue   Short Term Goals: Week 1: SLP Short Term Goal 1 (Week 1): Pt to communicate basic needs via any means with mod A. SLP Short Term Goal 2 (Week 1): Pt able to 1-step directives with mod A. SLP Short Term Goal 3 (Week 1): Pt able to produce single words with mod A for compensatory strategies- semantic cueing, sentence completion, etc. SLP Short Term Goal 4 (Week 1): Pt able to maintain attention to task for 5 minutes with mod A. SLP Short Term Goal 5 (Week 1): Pt able to demonstrate yes/no response with 70% acc with max A.  Skilled Therapeutic Interventions: Skilled treatment session focused on cognitive-linguistic goals. Patient received from PT sitting upright in the wheelchair and began to cry when the clinician entered the room. As clinician was taking the patient to the SLP office, patient was yelling out "tired" as well as crying. Clinician gave the patient ~5 minutes for a "rest break" in order to maximize the patient's participation. Patient answered all question in regards to her wants/needs and biographical information with "yes" despite Max A multimodal cues and decoded at the phrase level with Mod A visual cues and completed a sentence completion task with Max A verbal cues for word-finding. Patient crying throughout session with head in her blanket declining to participate, therefore, patient missed remaining 14 minutes of session. Patient transferred to bed at end of session and left with all needs within reach. Continue with current plan of care.    Function:  Cognition Comprehension Comprehension assist level: Understands basic 25 - 49% of the time/ requires cueing 50 - 75% of the time  Expression   Expression assist  level: Expresses basis less than 25% of the time/requires cueing >75% of the time.  Social Interaction Social Interaction assist level: Interacts appropriately 25 - 49% of time - Needs frequent redirection.  Problem Solving Problem solving assist level: Solves basic less than 25% of the time - needs direction nearly all the time or does not effectively solve problems and may need a restraint for safety  Memory Memory assist level: Recognizes or recalls less than 25% of the time/requires cueing greater than 75% of the time    Pain No/Denies Pain   Therapy/Group: Individual Therapy  Richie Bonanno 01/23/2016, 5:40 PM

## 2016-01-24 ENCOUNTER — Inpatient Hospital Stay (HOSPITAL_COMMUNITY): Payer: Medicare Other | Admitting: Occupational Therapy

## 2016-01-24 ENCOUNTER — Inpatient Hospital Stay (HOSPITAL_COMMUNITY): Payer: Medicare Other | Admitting: Speech Pathology

## 2016-01-24 ENCOUNTER — Inpatient Hospital Stay (HOSPITAL_COMMUNITY): Payer: Medicare Other | Admitting: Physical Therapy

## 2016-01-24 ENCOUNTER — Inpatient Hospital Stay (HOSPITAL_COMMUNITY): Payer: Medicare Other

## 2016-01-24 DIAGNOSIS — I10 Essential (primary) hypertension: Secondary | ICD-10-CM

## 2016-01-24 DIAGNOSIS — I482 Chronic atrial fibrillation: Secondary | ICD-10-CM

## 2016-01-24 DIAGNOSIS — S069X4S Unspecified intracranial injury with loss of consciousness of 6 hours to 24 hours, sequela: Secondary | ICD-10-CM

## 2016-01-24 LAB — URINE CULTURE

## 2016-01-24 LAB — GLUCOSE, CAPILLARY
GLUCOSE-CAPILLARY: 107 mg/dL — AB (ref 65–99)
GLUCOSE-CAPILLARY: 51 mg/dL — AB (ref 65–99)
GLUCOSE-CAPILLARY: 230 mg/dL — AB (ref 65–99)
Glucose-Capillary: 286 mg/dL — ABNORMAL HIGH (ref 65–99)
Glucose-Capillary: 272 mg/dL — ABNORMAL HIGH (ref 65–99)

## 2016-01-24 LAB — BASIC METABOLIC PANEL
Anion gap: 12 (ref 5–15)
Anion gap: 12 (ref 5–15)
BUN: 23 mg/dL — AB (ref 6–20)
BUN: 29 mg/dL — AB (ref 6–20)
CALCIUM: 8.8 mg/dL — AB (ref 8.9–10.3)
CHLORIDE: 101 mmol/L (ref 101–111)
CO2: 23 mmol/L (ref 22–32)
CO2: 24 mmol/L (ref 22–32)
CREATININE: 1.07 mg/dL — AB (ref 0.44–1.00)
CREATININE: 1.3 mg/dL — AB (ref 0.44–1.00)
Calcium: 8.8 mg/dL — ABNORMAL LOW (ref 8.9–10.3)
Chloride: 102 mmol/L (ref 101–111)
GFR calc Af Amer: 48 mL/min — ABNORMAL LOW (ref >60)
GFR calc non Af Amer: 41 mL/min — ABNORMAL LOW (ref >60)
GFR, EST NON AFRICAN AMERICAN: 52 mL/min — AB (ref >60)
GLUCOSE: 311 mg/dL — AB (ref 65–99)
Glucose, Bld: 313 mg/dL — ABNORMAL HIGH (ref 65–99)
Potassium: 6.3 mmol/L (ref 3.5–5.1)
Potassium: 5.9 mmol/L — ABNORMAL HIGH (ref 3.5–5.1)
SODIUM: 137 mmol/L (ref 135–145)
Sodium: 137 mmol/L (ref 135–145)

## 2016-01-24 NOTE — Significant Event (Signed)
Potassium level was 6.3,lab called and informed that her blood hemolyzed and not sure if the result is accurate. They will recollected. Pam PA was informed, waiting on the new  K result.

## 2016-01-24 NOTE — Progress Notes (Signed)
Occupational Therapy Session Note  Patient Details  Name: [name] [name] MRN: [id number] Date of Birth: Apr 27, 1946  Today's Date: 01/24/2016 OT Individual Time: 0700-0800 OT Individual Time Calculation (min): 60 min    Short Term Goals: Week 1:  OT Short Term Goal 1 (Week 1): Pt will don shirt with min A. OT Short Term Goal 2 (Week 1): Pt will don pants with mod A. OT Short Term Goal 3 (Week 1): Pt will transfer into shower with min A. OT Short Term Goal 4 (Week 1): Pt will be able to toilet with min A.  Skilled Therapeutic Interventions/Progress Updates:    Pt asleep in bed upon arrival but easily aroused.  Pt receptive to participating in therapy this morning and followed all one step commands.  Pt incontinent of bowel and bladder but replied "yes" when questioned if she needed to use the toilet.  Pt voided and had a small bowel movement.  Pt amb with RW to shower seat and engaged in bathing tasks with min verbal cues for initiation, sequencing, and thoroughness.  Pt completed dressing tasks with sit<>stand from seat before amb with RW to room and completed grooming tasks while seated in w/c at sink.  Pt completed all BADLs with steady A and min verbal cues for initiation and sequencing.  Pt remained in w/c with QRB in place and all needs within reach.  Focus on activity tolerance, standing balance, functional amb with RW, task initiation, sequencing, and safety awareness to increase independence with BADLs.   Therapy Documentation Precautions:  Precautions Precautions: Fall Precaution Comments: bilateral DARCO shoes Restrictions Weight Bearing Restrictions: No LLE Weight Bearing: Weight bearing as tolerated Pain: Pain Assessment Pain Assessment: Faces Faces Pain Scale: Hurts whole lot Pain Type: Acute pain Pain Location: Foot Pain Orientation: Right;Left Pain Descriptors / Indicators: Grimacing;Crying Pain Onset: With Activity Pain Intervention(s): RN made  aware;Repositioned ADL: ADL ADL Comments: refer to functional navigator  See Function Navigator for Current Functional Status.   Therapy/Group: Individual Therapy  Rich Brave 01/24/2016, 8:04 AM

## 2016-01-24 NOTE — Progress Notes (Signed)
Occupational Therapy Session Note  Patient Details  Name: CECILA HACKBARTH MRN: [id number] Date of Birth: 1946/07/11  Today's Date: 01/24/2016 OT Individual Time: 1015-1100 OT Individual Time Calculation (min): 45 min    Short Term Goals: Week 1:  OT Short Term Goal 1 (Week 1): Pt will don shirt with min A. OT Short Term Goal 2 (Week 1): Pt will don pants with mod A. OT Short Term Goal 3 (Week 1): Pt will transfer into shower with min A. OT Short Term Goal 4 (Week 1): Pt will be able to toilet with min A.  Skilled Therapeutic Interventions/Progress Updates:    Pt seen for therapeutic activity to address cognition and perception. Pt received in chair stating she was very tired and pointing to the bed.  Pt agreed to work with therapy with the promise she could lay down at the end. Pt was able to read a few words from her daily devotional book. Pt taken to gym and transferred to the mat. Worked on sorting activity to organize bean bags into food group categories. Pt able to read each word but then would have some verbal perseveration. She needed max cues to sort and max cues to fully attend to bags on her R side.  Pt taken back to room and ambulated with RW to toilet and then to her bed. Pt adjusted in bed with 3 rails up and bed alarm set. Call light on bed.  Therapy Documentation Precautions:  Precautions Precautions: Fall Precaution Comments: bilateral DARCO shoes Restrictions Weight Bearing Restrictions: No LLE Weight Bearing: Weight bearing as tolerated   Pain: Pain Assessment Pain Assessment: 0-10 Faces Pain Scale: No hurt Pain Location: Foot Pain Orientation: Right;Left Pain Descriptors / Indicators: Grimacing Pain Intervention(s): Repositioned ADL: ADL ADL Comments: refer to functional navigator  See Function Navigator for Current Functional Status.   Therapy/Group: Individual Therapy  SAGUIER,JULIA 01/24/2016, 12:34 PM

## 2016-01-24 NOTE — Significant Event (Signed)
Potasium level is 5.9, paged the DR in call .New orders: stop the kcl tb PO,encuraje the patient to drink more fluids by mouth and repeat the BMP in am.

## 2016-01-24 NOTE — Progress Notes (Signed)
SLP Cancellation Note  Patient Details Name: [name] [name] MRN: [id number] DOB: 22-Sep-1945  Cancelled treatment:       Patient missed 60 minutes of skilled SLP intervention due to fatigue and refusal. Upon arrival, patient was sleeping while supine in bed. When awakened by clinician, patient began to cry and yell out "tired" and pulled the blankets over her head. Patient left supine in bed.                                                                                                Willamina Grieshop 01/24/2016, 7:36 PM

## 2016-01-24 NOTE — Progress Notes (Signed)
[facility] PHYSICAL MEDICINE & REHABILITATION     PROGRESS NOTE    Subjective/Complaints: Sitting in w/c. Tearful and crying---perseverates on one thought. Hypoglycemic again today  ROS: limited due to aphasia/cognition    Objective: Vital Signs: Blood pressure 120/64, pulse 86, temperature 98.6 F (37 C), temperature source Oral, resp. rate 18, height  (1.499 m), weight 35 kg (77 lb 2.6 oz), SpO2 100 %. No results found.  Recent Labs  01/21/16 1808  WBC 10.6*  HGB 11.9*  HCT 36.2  PLT 154    Recent Labs  01/22/16 0428  NA 140  K 5.0  CL 108  GLUCOSE 201*  BUN 27*  CREATININE 0.94  CALCIUM 9.0   CBG (last 3)   Recent Labs  01/23/16 1633 01/23/16 2118 01/24/16 0744  GLUCAP 328* 198* 51*    Wt Readings from Last 3 Encounters:  01/24/16 35 kg (77 lb 2.6 oz)  01/07/16 36.2 kg (79 lb 12.9 oz)  12/24/15 41.912 kg (92 lb 6.4 oz)    Physical Exam:  Constitutional: She appears well-developed. She appears cachectic.   HENT:  Head: Normocephalic and atraumatic.  Mouth/Throat: poor dentition.   Eyes: Conjunctivae and EOM are normal. Pupils are equal, round, and reactive to light.  Neck: Normal range of motion. Neck supple.  Cardiovascular: An irregularly irregular rhythm present.  Murmur heard. Respiratory: Effort normal and breath sounds normal. No respiratory distress. She has no wheezes.  GI: Soft. Bowel sounds are normal. She exhibits no distension. There is no tenderness.  Musculoskeletal: She exhibits tenderness. She exhibits no edema.  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 Right 1st and 2nd toes ischemic appearing also       Neurological: She is alert.  Exp>rec aphasia. Word substitution.    Psychiatric: Her affect is tearful/agitated        Assessment/Plan: 1. Aphasia, cognitive, and functional deficits  secondary to TBI which require 3+ hours per day of  interdisciplinary therapy in a comprehensive inpatient rehab setting. Physiatrist is providing close team supervision and 24 hour management of active medical problems listed below. Physiatrist and rehab team continue to assess barriers to discharge/monitor patient progress toward functional and medical goals.  Function:  Bathing Bathing position Bathing activity did not occur: Refused Position: Shower  Bathing parts Body parts bathed by patient: Right arm, Left arm, Chest, Abdomen, Front perineal area, Right upper leg, Left upper leg, Buttocks Body parts bathed by helper: Right lower leg, Left lower leg  Bathing assist Assist Level: Touching or steadying assistance(Pt > 75%)      Upper Body Dressing/Undressing Upper body dressing   What is the patient wearing?: Button up shirt, Bra Bra - Perfomed by patient: Thread/unthread right bra strap, Thread/unthread left bra strap Bra - Perfomed by helper: Hook/unhook bra (pull down sports bra) Pull over shirt/dress - Perfomed by patient: Thread/unthread right sleeve, Thread/unthread left sleeve, Put head through opening Pull over shirt/dress - Perfomed by helper: Pull shirt over trunk Button up shirt - Perfomed by patient: Thread/unthread right sleeve, Thread/unthread left sleeve, Pull shirt around back, Button/unbutton shirt      Upper body assist Assist Level: Touching or steadying assistance(Pt > 75%)   Set up : To obtain clothing/put away  Lower Body Dressing/Undressing Lower body dressing   What is the patient wearing?: Pants, Non-skid slipper socks, Shoes   Underwear - Performed by helper: Thread/unthread right underwear leg, Thread/unthread left  underwear leg, Pull underwear up/down Pants- Performed by patient: Thread/unthread right pants leg, Thread/unthread left pants leg, Pull pants up/down Pants- Performed by helper: Thread/unthread right pants leg, Pull pants up/down Non-skid slipper socks- Performed by patient: Don/doff right  sock, Don/doff left sock Non-skid slipper socks- Performed by helper: Don/doff right sock, Don/doff left sock       Shoes - Performed by helper: Don/doff right shoe, Don/doff left shoe, Fasten right, Fasten left          Lower body assist Assist for lower body dressing: Touching or steadying assistance (Pt > 75%)      Toileting Toileting   Toileting steps completed by patient: Adjust clothing prior to toileting, Performs perineal hygiene, Adjust clothing after toileting Toileting steps completed by helper: Adjust clothing prior to toileting, Performs perineal hygiene, Adjust clothing after toileting Toileting Assistive Devices: Grab bar or rail  Toileting assist Assist level: Touching or steadying assistance (Pt.75%)   Transfers Chair/bed transfer   Chair/bed transfer method: Ambulatory Chair/bed transfer assist level: Touching or steadying assistance (Pt > 75%) Chair/bed transfer assistive device: Environmental consultant, Designer, fashion/clothing     Max distance: 50 Assist level: Touching or steadying assistance (Pt > 75%)   Wheelchair   Type: Manual Max wheelchair distance: 10 Assist Level: Dependent (Pt equals 0%)  Cognition Comprehension Comprehension assist level: Understands basic 25 - 49% of the time/ requires cueing 50 - 75% of the time  Expression Expression assist level: Expresses basis less than 25% of the time/requires cueing >75% of the time.  Social Interaction Social Interaction assist level: Interacts appropriately 25 - 49% of time - Needs frequent redirection.  Problem Solving Problem solving assist level: Solves basic less than 25% of the time - needs direction nearly all the time or does not effectively solve problems and may need a restraint for safety  Memory Memory assist level: Recognizes or recalls less than 25% of the time/requires cueing greater than 75% of the time   Medical Problem List and Plan: 1. Aphasia, gait disorder, cognitive deficits  secondary to Left subdural hematoma, left superior inferior pubic ramus fracture status post fall  -continue CIR therapies. Clinical appearance does wax and wane---anxious/agitated today 2. DVT Prophylaxis/Anticoagulation: Mechanical: Sequential compression devices, below knee Bilateral lower extremities 3. Pain Management: comfortable at present  -follow as she works with therapy 4. Mood: LCSW to follow for evaluation and support.  5. Neuropsych: This patient is not capable of making decisions on her own behalf.  6. Skin/Wound Care: Prevalon boots bilateral feet--elevate when in bed chair. Needs to have shoes on when out of bed.  7. Fluids/Electrolytes/Nutrition: Monitor I/O. Needs assistance with meals.   -need to push fluids 8. A fib with severe mitral stenosis and symptomatic AS:  Monitor for CP or orthostatic symptoms.  9. DM type 2: monitor BS ac/hs.  Hypoglycemic again today---dc PM lantus and maintain AM lantus   -10u am lantus only for now  -intake inconsistent   10. Malnutrition:  protein supplement/encourage po    11. CKD s/p renal transplant: ON cyclosporine, prednisone and cellcept.  12. PAD with claudication and gangrenous change left toes: conservative care for now---will need amputation at some point 13. HTN: Monitor BP bid. Continue norvasc 14. New onset seizures: On keppra 750mg  bid     LOS (Days) 8 A FACE TO FACE EVALUATION WAS PERFORMED  Theon Sobotka T 01/24/2016 9:13 AM

## 2016-01-24 NOTE — Progress Notes (Signed)
Occupational Therapy Weekly Progress Note  Patient Details  Name: [name] [name] MRN: [id number] Date of Birth: [birth year]  Beginning of progress report period: Jan 17, 2016 End of progress report period: Jan 24, 2016  Patient has met 4 of 4 short term goals. Pt made inconsistent progress with BADLs this past week but achieved all STG at min A level.  Pt continues to require max verbal cues to initiate and sequence all BADLs.  Pt requires mod/max encouragement to participate in therapy.  Pt follows one step commands with approx 75% accuracy.   Patient continues to demonstrate the following deficits: aphasia, decreased cognitive skills of awareness, initiation, attention, decreased activity tolerance and therefore will continue to benefit from skilled OT intervention to enhance overall performance with BADL.  Patient progressing toward long term goals..  Continue plan of care.  OT Short Term Goals Week 1:  OT Short Term Goal 1 (Week 1): Pt will don shirt with min A. OT Short Term Goal 1 - Progress (Week 1): Met OT Short Term Goal 2 (Week 1): Pt will don pants with mod A. OT Short Term Goal 2 - Progress (Week 1): Met OT Short Term Goal 3 (Week 1): Pt will transfer into shower with min A. OT Short Term Goal 3 - Progress (Week 1): Met OT Short Term Goal 4 (Week 1): Pt will be able to toilet with min A. OT Short Term Goal 4 - Progress (Week 1): Met Week 2:  OT Short Term Goal 1 (Week 2): STG=LTG secondary to ELOS      Therapy Documentation Precautions:  Precautions Precautions: Fall Precaution Comments: bilateral DARCO shoes Restrictions Weight Bearing Restrictions: No LLE Weight Bearing: Weight bearing as tolerated Pain: Pain Assessment Pain Assessment: 0-10 Faces Pain Scale: No hurt ADL: ADL ADL Comments: refer to functional navigator  See Function Navigator for Current Functional Status.    Leotis Shames [facility] 01/24/2016, 3:13 PM

## 2016-01-24 NOTE — Progress Notes (Signed)
Low blood sugar, Dr informed.

## 2016-01-24 NOTE — Progress Notes (Signed)
Physical Therapy Session Note  Patient Details  Name: [name] [name] MRN: [id number] Date of Birth: Mar 26, 1946  Today's Date: 01/24/2016 PT Individual Time: 0900-1000 PT Individual Time Calculation (min): 60 min   Short Term Goals: Week 2:  PT Short Term Goal 1 (Week 2): = LTGs due to anticipated LOS  Skilled Therapeutic Interventions/Progress Updates:    Pt received in w/c & agreeable to PT. Pt unable verbally communicate pain levels but grimaced with BLE movement. Pt with very little verbal communication during session, only stating "yes" and repeating words heard throughout session. Pt transported to ortho gym via w/c total A for time management, sit<>stand transfer with Steady A & negotiated 3 steps with B railings for strengthening. Pt required increased time and movement, especially to descend stairs as pt grimaced with pain. Pt initally required HOH placement of hands on railings. Nu-step for cardiovascular endurance training on Level 1 x 5 minutes with PT providing consistent feedback & instruction for task; pt would state "tired" and at the end of activity reported "hurt" but did not locate where. After extended rest break attempted to have pt ambulate but pt became tearful and refusing to do so, even when offered to do so in short distances. Pt instructed on & performed BLE therapeutic exercises with mutlimodal cuing for feedback to ensure proper technique. Exercises included BLE: long arc quads, seated hip flexion, hip adduction squeezes with pillow. Attempted to have pt propel w/c with BUE but pt unable to do so even with [facility] assistance from therapist. Pt returned to room & left in w/c with QRB in place & call bell in lap. Throughout session pt completed stand pivot transfers with Min A.   Therapy Documentation Precautions:  Precautions Precautions: Fall Precaution Comments: bilateral DARCO shoes Restrictions Weight Bearing Restrictions: No LLE Weight Bearing: Weight bearing as  tolerated   See Function Navigator for Current Functional Status.   Therapy/Group: Individual Therapy  Sandi Mariscal 01/24/2016, 8:20 AM

## 2016-01-24 NOTE — Progress Notes (Addendum)
Speech Language Pathology Weekly Progress Note  Patient Details  Name: [name] [name] MRN: [id number] Date of Birth: 12-Mar-1946  Beginning of progress report period: Jan 17, 2016 End of progress report period: Jan 24, 2016   Short Term Goals: Week 1: SLP Short Term Goal 1 (Week 1): Pt to communicate basic needs via any means with mod A. Met SLP Short Term Goal 2 (Week 1): Pt able to 1-step directives with mod A. Met SLP Short Term Goal 3 (Week 1): Pt able to produce single words with mod A for compensatory strategies- semantic cueing, sentence completion, etc.  Met SLP Short Term Goal 4 (Week 1): Pt able to maintain attention to task for 5 minutes with mod A. Not Met SLP Short Term Goal 5 (Week 1): Pt able to demonstrate yes/no response with 70% acc with max A. Not Met    New Short Term Goals: Week 2: SLP Short Term Goal 1 (Week 2): Pt to communicate basic needs via multimodal communication with min A multimodal cues. SLP Short Term Goal 2 (Week 2): Pt will answer yes/no questions with 50% accuracy with Max a multimodal cues in regards to wants/needs.  SLP Short Term Goal 3 (Week 2): Pt will demonstrate orientation to place, time and situation with Mod A multimodal cues.  SLP Short Term Goal 4 (Week 2): Pt will name functional items with 75% accuracy with min A multimodal cues.  SLP Short Term Goal 5 (Week 2): Pt will follow to 1-step directives with 90% accuracy with mod A multimodal cues. SLP Short Term Goal 6 (Week 2): Patient will consume current diet with minimal overt s/s of aspiration with supervision verbal cues for use of swallowing strategies.    Weekly Progress Updates: Patient has made minimal and inconsistent gains and has met 3 of 5 STG's this reporting period due to improved functional communication. Currently, patient communicates her basic wants/needs at the word level and can name functional items at the word level with Mod A multimodal cues. Patient can follow 1 step  directions in 75% of opportunities and can perseverate on "yes" while answering basic yes/no questions with ~25% accuracy. Patient requires Max and constant encouragement for participation and her progress is limited by fatigue and decreased participation. Family has not been present, therefore, education has not taken place. However, patient's d/c plan has been changed to SNF. Patient would benefit from continued skilled SLP intervention to maximize her functional communication and overall independence prior to discharge.     Intensity: Minumum of 1-2 x/day, 30 to 90 minutes Frequency: 3 to 5 out of 7 days Duration/Length of Stay: TBD due to SNF placement Treatment/Interventions: Cognitive remediation/compensation;Multimodal communication approach;Speech/Language facilitation;Functional tasks;Cueing hierarchy;Therapeutic Activities;Dysphagia/aspiration precaution training;Patient/family education;Environmental controls;Internal/external aids    [name] [name] 01/24/2016, 7:45 PM

## 2016-01-25 ENCOUNTER — Inpatient Hospital Stay (HOSPITAL_COMMUNITY): Payer: Medicare Other | Admitting: Physical Therapy

## 2016-01-25 LAB — BASIC METABOLIC PANEL
Anion gap: 12 (ref 5–15)
BUN: 25 mg/dL — ABNORMAL HIGH (ref 6–20)
CALCIUM: 8.8 mg/dL — AB (ref 8.9–10.3)
CHLORIDE: 106 mmol/L (ref 101–111)
CO2: 24 mmol/L (ref 22–32)
CREATININE: 1.03 mg/dL — AB (ref 0.44–1.00)
GFR calc non Af Amer: 55 mL/min — ABNORMAL LOW (ref >60)
GLUCOSE: 89 mg/dL (ref 65–99)
Potassium: 4.6 mmol/L (ref 3.5–5.1)
Sodium: 142 mmol/L (ref 135–145)

## 2016-01-25 LAB — GLUCOSE, CAPILLARY
GLUCOSE-CAPILLARY: 194 mg/dL — AB (ref 65–99)
Glucose-Capillary: 81 mg/dL (ref 65–99)

## 2016-01-25 NOTE — Plan of Care (Signed)
Problem: RH SKIN INTEGRITY Goal: RH STG SKIN FREE OF INFECTION/BREAKDOWN Skin to remain free from infection/breakdown while on rehab with mod assist  Outcome: Not Progressing Necrotic left toes  Problem: RH SAFETY Goal: RH STG ADHERE TO SAFETY PRECAUTIONS W/ASSISTANCE/DEVICE STG Adhere to Safety Precautions With min I Assistance/Device.  Outcome: Not Progressing No safety awareness; bed alarm always

## 2016-01-25 NOTE — Progress Notes (Signed)
Physical Therapy Session Note  Patient Details  Name: [name] [name] MRN: [id number] Date of Birth: 1945/12/20  Today's Date: 01/25/2016 PT Individual Time: 1030-1050 PT Individual Time Calculation (min): 20 min   Short Term Goals: Week 1:  PT Short Term Goal 1 (Week 1): Patient will tolerate standing x 3 min while engaging in functional task.  PT Short Term Goal 1 - Progress (Week 1): Met PT Short Term Goal 2 (Week 1): Patient will retrieve object from floor with min A.  PT Short Term Goal 2 - Progress (Week 1): Not met PT Short Term Goal 3 (Week 1): Patient will negotiate up/down 4 stairs using 2 rails with min A.  PT Short Term Goal 3 - Progress (Week 1): Progressing toward goal PT Short Term Goal 4 (Week 1): Patient will ambulate 50 ft using RW with steady assist.  PT Short Term Goal 4 - Progress (Week 1): Met PT Short Term Goal 5 (Week 1): Patient will perform bed mobility with supervision.  PT Short Term Goal 5 - Progress (Week 1): Met  Skilled Therapeutic Interventions/Progress Updates:    Pt sleeping soundly in bed upon PT arrival.  Pt somewhat difficult to arouse, noted to be incontinent of urine.  Pt somnolent but agreeable to change brief.  Pt rolling in bed L and R with supervision for doffing soiled brief.  Bridging with supervision x2 for peri-care and donning clean brief.  Pt declining out of bed therapy today, perseverating "nap, nap, nap, nap, nap."  Pt agreeable to don pajama bottoms and performed additional rolling and bridging for LB dressing at bed level.  Pt left supine in bed with call bell in reach and needs met. Missed 10 minutes of skilled therapy.   Therapy Documentation Precautions:  Precautions Precautions: Fall Precaution Comments: bilateral DARCO shoes Restrictions Weight Bearing Restrictions: No LLE Weight Bearing: Weight bearing as tolerated General: PT Amount of Missed Time (min): 10 Minutes PT Missed Treatment Reason: Patient fatigue   See  Function Navigator for Current Functional Status.   Therapy/Group: Individual Therapy  Earnest Conroy Penven-Crew 01/25/2016, 12:31 PM

## 2016-01-25 NOTE — Progress Notes (Signed)
[name] [name] is a 70 y.o. female 1945-11-23 [id number]  Subjective:  No new problems. Eating breakfast under supervision  Objective: Vital signs in last 24 hours: Temp:  [97.8 F (36.6 C)-98.4 F (36.9 C)] 98.4 F (36.9 C) (05/20 0509) Pulse Rate:  [54-81] 54 (05/20 0509) Resp:  [17-18] 17 (05/20 0509) BP: (114-160)/(73-98) 160/98 mmHg (05/20 0509) SpO2:  [100 %] 100 % (05/20 0509) Weight:  [78 lb 4.2 oz (35.5 kg)] 78 lb 4.2 oz (35.5 kg) (05/20 0509) Weight change: 1 lb 1.6 oz (0.5 kg) Last BM Date: 01/24/16  Intake/Output from previous day: 05/19 0701 - 05/20 0700 In: 480 [P.O.:480] Out: -  Last cbgs: CBG (last 3)   Recent Labs  01/24/16 1702 01/24/16 2152 01/25/16 0708  GLUCAP 272* 230* 81     Physical Exam General: No apparent distress   HEENT: not dry Lungs: Normal effort. Lungs clear to auscultation, no crackles or wheezes. Cardiovascular: Regular rate and rhythm, no edema Abdomen: S/NT/ND; BS(+) Musculoskeletal:  unchanged Neurological: No new neurological deficits Wounds: N/A    Skin: clear  Aging changes Mental state: Alert, cooperative, aphasic    Lab Results: BMET    Component Value Date/Time   NA 142 01/25/2016 0614   K 4.6 01/25/2016 0614   CL 106 01/25/2016 0614   CO2 24 01/25/2016 0614   GLUCOSE 89 01/25/2016 0614   BUN 25* 01/25/2016 0614   CREATININE 1.03* 01/25/2016 0614   CREATININE 1.17* 11/13/2015 1412   CALCIUM 8.8* 01/25/2016 0614   GFRNONAA 55* 01/25/2016 0614   GFRAA >60 01/25/2016 0614   CBC    Component Value Date/Time   WBC 10.6* 01/21/2016 1808   RBC 3.54* 01/21/2016 1808   HGB 11.9* 01/21/2016 1808   HCT 36.2 01/21/2016 1808   PLT 154 01/21/2016 1808   MCV 102.3* 01/21/2016 1808   MCH 33.6 01/21/2016 1808   MCHC 32.9 01/21/2016 1808   RDW 14.7 01/21/2016 1808   LYMPHSABS 0.6* 01/21/2016 1808   MONOABS 0.8 01/21/2016 1808   EOSABS 0.0 01/21/2016 1808   BASOSABS 0.0 01/21/2016 1808     Studies/Results: No results found.  Medications: I have reviewed the patient's current medications.  Assessment/Plan:  1. Aphasia, gait disorder, cognitive deficits secondary to Left subdural hematoma, left superior inferior pubic ramus fracture status post fall -continue CIR therapies. Clinical appearance does wax and wane---anxious/agitated today 2. DVT Prophylaxis/Anticoagulation: Mechanical: Sequential compression devices, below knee Bilateral lower extremities 3. Pain Management: comfortable at present -follow as she works with therapy 4. Mood: LCSW to follow for evaluation and support.  5. Neuropsych: This patient is not capable of making decisions on her own behalf.  6. Skin/Wound Care: Prevalon boots bilateral feet--elevate when in bed chair. Needs to have shoes on when out of bed.  7. Fluids/Electrolytes/Nutrition: Monitor I/O. Needs assistance with meals.  -need to push fluids 8. A fib with severe mitral stenosis and symptomatic AS: Monitor for CP or orthostatic symptoms.  9. DM type 2: monitor BS ac/hs. Hypoglycemic again today---dc PM lantus and maintain AM lantus  -10u am lantus only for now -intake inconsistent  10. Malnutrition: protein supplement/encourage po   11. CKD s/p renal transplant: ON cyclosporine, prednisone and cellcept.  12. PAD with claudication and gangrenous change left toes: conservative care for now---will need amputation at some point 13. HTN: Monitor BP bid. Continue norvasc 14. New onset seizures: On keppra  bid    Length of stay, days: 9  Sonda Primes , MD 01/25/2016,  8:51 AM

## 2016-01-26 LAB — GLUCOSE, CAPILLARY
GLUCOSE-CAPILLARY: 80 mg/dL (ref 65–99)
Glucose-Capillary: 361 mg/dL — ABNORMAL HIGH (ref 65–99)
Glucose-Capillary: 276 mg/dL — ABNORMAL HIGH (ref 65–99)
Glucose-Capillary: 303 mg/dL — ABNORMAL HIGH (ref 65–99)

## 2016-01-26 NOTE — Plan of Care (Signed)
Problem: RH SKIN INTEGRITY Goal: RH STG SKIN FREE OF INFECTION/BREAKDOWN Skin to remain free from infection/breakdown while on rehab with mod assist  Outcome: Not Progressing Necrotic toes pending surgery sometime per MD notes

## 2016-01-26 NOTE — Progress Notes (Signed)
[name] [name] is a 70 y.o. female 1946-04-01 [id number]  Subjective:  No new problems. Asleep  Objective: Vital signs in last 24 hours: Temp:  [98 F (36.7 C)-98.1 F (36.7 C)] 98.1 F (36.7 C) (05/21 5621) Pulse Rate:  [78-89] 78 (05/21 0613) Resp:  [17-18] 17 (05/21 0613) BP: (104-152)/(74-100) 152/100 mmHg (05/21 0613) SpO2:  [100 %] 100 % (05/21 0613) Weight:  [82 lb 0.2 oz (37.2 kg)] 82 lb 0.2 oz (37.2 kg) (05/21 3086) Weight change: 3 lb 12 oz (1.7 kg) Last BM Date: 01/24/16  Intake/Output from previous day: 05/20 0701 - 05/21 0700 In: 400 [P.O.:400] Out: -  Last cbgs: CBG (last 3)   Recent Labs  01/25/16 0708 01/25/16 1643 01/26/16 0653  GLUCAP 81 194* 361*     Physical Exam General: No apparent distress   HEENT: not dry Lungs: Normal effort. Lungs clear to auscultation, no crackles or wheezes. Cardiovascular: Regular rate and rhythm, no edema Abdomen: S/NT/ND; BS(+) Musculoskeletal:  unchanged Neurological: No new neurological deficits Wounds: N/A    Skin: clear  Aging changes Mental state: Asleep this am    Lab Results: BMET    Component Value Date/Time   NA 142 01/25/2016 0614   K 4.6 01/25/2016 0614   CL 106 01/25/2016 0614   CO2 24 01/25/2016 0614   GLUCOSE 89 01/25/2016 0614   BUN 25* 01/25/2016 0614   CREATININE 1.03* 01/25/2016 0614   CREATININE 1.17* 11/13/2015 1412   CALCIUM 8.8* 01/25/2016 0614   GFRNONAA 55* 01/25/2016 0614   GFRAA >60 01/25/2016 0614   CBC    Component Value Date/Time   WBC 10.6* 01/21/2016 1808   RBC 3.54* 01/21/2016 1808   HGB 11.9* 01/21/2016 1808   HCT 36.2 01/21/2016 1808   PLT 154 01/21/2016 1808   MCV 102.3* 01/21/2016 1808   MCH 33.6 01/21/2016 1808   MCHC 32.9 01/21/2016 1808   RDW 14.7 01/21/2016 1808   LYMPHSABS 0.6* 01/21/2016 1808   MONOABS 0.8 01/21/2016 1808   EOSABS 0.0 01/21/2016 1808   BASOSABS 0.0 01/21/2016 1808    Studies/Results: No results found.  Medications: I have  reviewed the patient's current medications.  Assessment/Plan:  1. Aphasia, gait disorder, cognitive deficits secondary to Left subdural hematoma, left superior inferior pubic ramus fracture status post fall -continue CIR therapies. Clinical appearance does wax and wane---anxious/agitated today 2. DVT Prophylaxis/Anticoagulation: Mechanical: Sequential compression devices, below knee Bilateral lower extremities 3. Pain Management: comfortable at present -follow as she works with therapy 4. Mood: LCSW to follow for evaluation and support.  5. Neuropsych: This patient is not capable of making decisions on her own behalf.  6. Skin/Wound Care: Prevalon boots bilateral feet--elevate when in bed chair. Needs to have shoes on when out of bed.  7. Fluids/Electrolytes/Nutrition: Monitor I/O. Needs assistance with meals.  -need to push fluids 8. A fib with severe mitral stenosis and symptomatic AS: Monitor for CP or orthostatic symptoms.  9. DM type 2: monitor BS ac/hs. Hypoglycemic again today---dc PM lantus and maintain AM lantus  -10u am lantus only for now -intake inconsistent  10. Malnutrition: protein supplement/encourage po   11. CKD s/p renal transplant: ON cyclosporine, prednisone and cellcept.  12. PAD with claudication and gangrenous change left toes: conservative care for now---will need amputation at some point 13. HTN: Monitor BP bid. Continue norvasc 14. New onset seizures: On keppra  bid  Cont current Rx    Length of stay, days: 10  Sonda Primes , MD  01/26/2016, 9:26 AM

## 2016-01-27 ENCOUNTER — Inpatient Hospital Stay (HOSPITAL_COMMUNITY): Payer: Medicare Other

## 2016-01-27 ENCOUNTER — Inpatient Hospital Stay (HOSPITAL_COMMUNITY): Payer: Medicare Other | Admitting: Speech Pathology

## 2016-01-27 ENCOUNTER — Inpatient Hospital Stay (HOSPITAL_COMMUNITY): Payer: Medicare Other | Admitting: Physical Therapy

## 2016-01-27 LAB — GLUCOSE, CAPILLARY
GLUCOSE-CAPILLARY: 132 mg/dL — AB (ref 65–99)
GLUCOSE-CAPILLARY: 225 mg/dL — AB (ref 65–99)
GLUCOSE-CAPILLARY: 95 mg/dL (ref 65–99)
GLUCOSE-CAPILLARY: 145 mg/dL — AB (ref 65–99)
GLUCOSE-CAPILLARY: 245 mg/dL — AB (ref 65–99)
Glucose-Capillary: 239 mg/dL — ABNORMAL HIGH (ref 65–99)

## 2016-01-27 MED ORDER — INSULIN GLARGINE 100 UNIT/ML ~~LOC~~ SOLN
15.0000 [IU] | Freq: Every day | SUBCUTANEOUS | Status: DC
  Administered 2016-01-28 – 2016-01-29 (×2): 15 [IU] via SUBCUTANEOUS
  Filled 2016-01-27 (×2): qty 0.15

## 2016-01-27 NOTE — Progress Notes (Signed)
prevalon boots, and SCD's removed at 0445 by patient. Rudie Meyer, RN

## 2016-01-27 NOTE — Progress Notes (Signed)
Occupational Therapy Session Note  Patient Details  Name: [name] [name] MRN: [id number] Date of Birth: Jul 03, 1946  Today's Date: 01/27/2016 OT Individual Time: 0700-0800 OT Individual Time Calculation (min): 60 min    Short Term Goals: Week 2:  OT Short Term Goal 1 (Week 2): STG=LTG secondary to ELOS  Skilled Therapeutic Interventions/Progress Updates:    Pt resting in bed upon arrival and agreeable to participating in therapy.  Pt sat EOB and donned socks at supervision level.  Pt required min A for sit<>stand from EOB and steady A when amb with RW to bathroom to use toilet.  Pt incontinent of bladder.  Pt did not have any clean clothing and declined bathing this morning.  Pt completed grooming tasks while seated in w/c at sink.  Pt engaged in self feeding breakfast.  Pt engaged in sit<>stand and standing tasks to simulate tasks required to perform BADLs.  Pt required multiple rest breaks throughout session.  Focus on activity tolerance, functional amb with RW, sit<>stand, standing balance, and safety awareness to increase independence with BADLs.   Therapy Documentation Precautions:  Precautions Precautions: Fall Precaution Comments: bilateral DARCO shoes Restrictions Weight Bearing Restrictions: No LLE Weight Bearing: Weight bearing as tolerated Pain:  Pt grimaces when donning socks; RN aware, emotional support ADL: ADL ADL Comments: refer to functional navigator  See Function Navigator for Current Functional Status.   Therapy/Group: Individual Therapy  Rich Brave 01/27/2016, 8:01 AM

## 2016-01-27 NOTE — Progress Notes (Signed)
Physical Therapy Session Note  Patient Details  Name: [name] [name] MRN: [id number] Date of Birth: [birth year]  Today's Date: 01/27/2016 PT Individual Time: 0910-1003 PT Individual Time Calculation (min): 53 min   Short Term Goals: Week 2:  PT Short Term Goal 1 (Week 2): = LTGs due to anticipated LOS  Skilled Therapeutic Interventions/Progress Updates:   Pt received in w/c with blankets around her.  Pt reporting feeling cold, especially in feet.  Obtained second pair of socks, pt donned with max cues and assisted with re-donning of DARCO shoes. Pt denied pain at rest and pt agreeable to therapy.  Pt engaged in functional sorting tasks in ADL kitchen at counter to focus on standing balance and tolerance, sustained attention and mental flexibility.  Pt able to perform sit <> stand from w/c with min A for controlled stand > sit and able to side step along counter with supervision-min A.  Pt able to stand at counter for 2-5 minutes at a time with supervision overall except for one episode of posterior LOB with min A to recover.  Pt standing tolerance limited by pain in feet; during seated rest breaks pt engaged in naming activity (name of silverware, foods eaten with that type of silverware).  Pt cued to sort 4 types of silverware spread over countertop into appropriate slots with pt demonstrating ability to focus on one type of silverware at a time (not able to switch between silverware when sorting) and needing mod cues to place into correct slot.  Pt then cued to separate food items between two groups based on use; pt able to correctly sort 25% of items with pt becoming very distracted by pain in feet.  Returned to room and pt transferred to bed stand pivot min A and sit > supine supervision.   Therapy Documentation Precautions:  Precautions Precautions: Fall Precaution Comments: bilateral DARCO shoes Restrictions Weight Bearing Restrictions: No LLE Weight Bearing: Weight bearing as  tolerated   See Function Navigator for Current Functional Status.   Therapy/Group: Individual Therapy  Edman Circle [facility] 01/27/2016, 12:15 PM

## 2016-01-27 NOTE — Progress Notes (Signed)
SLP Cancellation Note  Patient Details Name: [name] [name] MRN: [id number] DOB: December 19, 1945   Cancelled treatment:   When ST was entering room, nursing alerted ST to pt recently placed in bed due to emotional outburst and request to be placed in bed. ST entered room and pt awoke with significant tearfulness and repetition of "He will not leave the righteous forsaken." Pt pulled blankets over head. Pt unable to participate in session at this time. Nursing aware, pt left supine in bed with alarms in place.      Odarius Dines 01/27/2016, 3:09 PM

## 2016-01-27 NOTE — NC FL2 (Signed)
[place] MEDICAID FL2 LEVEL OF CARE SCREENING TOOL     IDENTIFICATION  Patient Name: [name] [name] Birthdate: 03-Feb-1946 Sex: female Admission Date (Current Location): 01/16/2016  [facility] and [place] Number:  Producer, television/film/video and Address:  [facility], [address] [place], [place] [address]      Provider Number: [id number]  Attending Physician Name and Address:  Ranelle Oyster, MD  Relative Name and Phone Number:       Current Level of Care: Other (Comment) (Inpatient Rehabilitation Unit) Recommended Level of Care: Skilled Nursing Facility Prior Approval Number:    Date Approved/Denied:   PASRR Number: [id number]  Discharge Plan: SNF    Current Diagnoses: Patient Active Problem List   Diagnosis Date Noted  . TBI (traumatic brain injury) (HCC) 01/16/2016  . Protein-calorie malnutrition, severe 01/14/2016  . SAH (subarachnoid hemorrhage) (HCC)   . Benign essential HTN   . Seizures (HCC) 01/13/2016  . Anticoagulated on Coumadin   . Subdural hematoma (HCC)   . Chronic atrial fibrillation (HCC)   . History of renal transplant   . PVD (peripheral vascular disease) (HCC)   . Ischemic ulcer of both feet (HCC)   . SDH (subdural hematoma) (HCC) 01/07/2016  . Poorly controlled type 2 diabetes mellitus (HCC) 11/23/2015  . Hypoglycemia 11/23/2015  . Critical lower limb ischemia 11/20/2015  . Encounter for therapeutic drug monitoring 10/06/2013  . Colon cancer screening 06/01/2012  . 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 PRIMARY HYPERPARATHYROIDISM 08/06/2010  . ANEMIA 08/06/2010  . DEPRESSION 08/06/2010  . Mitral stenosis 08/06/2010  . Atrial fibrillation (HCC) 08/06/2010  . 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 DIASTOLIC DYSFUNCTION 08/06/2010  . Cerebral artery occlusion with cerebral infarction (HCC) 08/06/2010  . PVD 08/06/2010  . RENAL FAILURE, END STAGE 08/06/2010  . PACEMAKER, PERMANENT 08/06/2010    Orientation RESPIRATION  BLADDER Height & Weight     Self  Normal Continent (occasional incontinence at night and if doesn't get to bathroom quick enouth) Weight: 36.1 kg (79 lb 9.4 oz) Height:   (149.9 cm)  BEHAVIORAL SYMPTOMS/MOOD NEUROLOGICAL BOWEL NUTRITION STATUS      Continent Diet (Dys 3 thin)  AMBULATORY STATUS COMMUNICATION OF NEEDS Skin   Extensive Assist (requires very close but light physical assistance due to cognition.  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 Right 1st and 2nd toes ischemic appearing also  )                       Personal Care Assistance Level of Assistance  Bathing, Feeding, Dressing, Total care Bathing Assistance: Limited assistance Feeding assistance: Limited assistance Dressing Assistance: Limited assistance Total Care Assistance: Limited assistance   Functional Limitations Info  Speech     Speech Info: Impaired    SPECIAL CARE FACTORS FREQUENCY  PT (By licensed PT), OT (By licensed OT)     PT Frequency: 5x/wk OT Frequency: 5x/wk     Speech Therapy Frequency: 5x/wk      Contractures      Additional Factors Info  Code Status, Allergies, Insulin Sliding Scale Code Status Info: Full Allergies Info: Bctrim, Aspirin, shellfish, codeine, latex, penicillins, tape   Insulin Sliding Scale Info: insulin aspart (novoLOG) injection 0-9 Units       Current Medications (01/27/2016):  This is the current hospital active medication list Current Facility-Administered Medications  Medication Dose Route Frequency Provider Last Rate Last Dose  .  acetaminophen (TYLENOL) tablet 500 mg  500 mg Oral TID WC & HS Evlyn Kanner Love, PA-C   500 mg at 01/27/16 1315  . alum & mag hydroxide-simeth (MAALOX/MYLANTA) 200-200-20 MG/5ML suspension 30 mL  30 mL Oral Q4H PRN Evlyn Kanner Love, PA-C   30 mL at 01/26/16 0341  . amLODipine (NORVASC) tablet 5 mg  5 mg Oral Daily Jacquelynn Cree, PA-C   5 mg at 01/27/16 6962   . antiseptic oral rinse (CPC / CETYLPYRIDINIUM CHLORIDE 0.05%) solution 7 mL  7 mL Mouth Rinse BID Evlyn Kanner Love, PA-C   7 mL at 01/27/16 1200  . bisacodyl (DULCOLAX) suppository 10 mg  10 mg Rectal Daily PRN Jacquelynn Cree, PA-C      . chlorhexidine (PERIDEX) 0.12 % solution 15 mL  15 mL Mouth Rinse BID Evlyn Kanner Love, PA-C   15 mL at 01/27/16 0810  . cycloSPORINE modified (NEORAL) capsule 25 mg  25 mg Oral BID Jacquelynn Cree, PA-C   25 mg at 01/27/16 0809  . digoxin (LANOXIN) tablet 0.125 mg  0.125 mg Oral Daily Jacquelynn Cree, PA-C   0.125 mg at 01/27/16 0809  . diphenhydrAMINE (BENADRYL) 12.5 MG/5ML elixir 12.5-25 mg  12.5-25 mg Oral Q6H PRN Evlyn Kanner Love, PA-C      . feeding supplement (GLUCERNA SHAKE) (GLUCERNA SHAKE) liquid 237 mL  237 mL Oral TID WC Evlyn Kanner Love, PA-C   237 mL at 01/27/16 1316  . guaiFENesin-dextromethorphan (ROBITUSSIN DM) 100-10 MG/5ML syrup 5-10 mL  5-10 mL Oral Q6H PRN Jacquelynn Cree, PA-C      . hydroxypropyl methylcellulose / hypromellose (ISOPTO TEARS / GONIOVISC) 2.5 % ophthalmic solution 1 drop  1 drop Both Eyes QID PRN Jacquelynn Cree, PA-C      . insulin aspart (novoLOG) injection 0-9 Units  0-9 Units Subcutaneous TID WC Jacquelynn Cree, PA-C   3 Units at 01/27/16 1316  . [START ON 01/28/2016] insulin glargine (LANTUS) injection 15 Units  15 Units Subcutaneous Daily Ranelle Oyster, MD      . iron polysaccharides (NIFEREX) capsule 150 mg  150 mg Oral Daily Jacquelynn Cree, PA-C   150 mg at 01/27/16 0809  . levETIRAcetam (KEPPRA) 100 MG/ML solution 750 mg  750 mg Oral BID Ranelle Oyster, MD   750 mg at 01/27/16 0809  . metoprolol tartrate (LOPRESSOR) tablet 25 mg  25 mg Oral BID Jacquelynn Cree, PA-C   25 mg at 01/27/16 9528  . mycophenolate (CELLCEPT) capsule 500 mg  500 mg Oral BID Jacquelynn Cree, PA-C   500 mg at 01/27/16 4132  . ondansetron (ZOFRAN) tablet 4 mg  4 mg Oral Q6H PRN Jacquelynn Cree, PA-C       Or  . ondansetron [facility]) injection 4 mg  4 mg Intravenous Q6H  PRN Jacquelynn Cree, PA-C      . pravastatin (PRAVACHOL) tablet 20 mg  20 mg Oral QPM Evlyn Kanner Love, PA-C   20 mg at 01/26/16 1744  . predniSONE (DELTASONE) tablet 5 mg  5 mg Oral Daily Jacquelynn Cree, PA-C   5 mg at 01/27/16 0809  . prochlorperazine (COMPAZINE) tablet 5-10 mg  5-10 mg Oral Q6H PRN Evlyn Kanner Love, PA-C       Or  . prochlorperazine (COMPAZINE) injection 5-10 mg  5-10 mg Intramuscular Q6H PRN Jacquelynn Cree, PA-C       Or  . prochlorperazine (COMPAZINE) suppository 12.5 mg  12.5 mg Rectal Q6H PRN Jacquelynn Cree, PA-C      . senna-docusate (Senokot-S) tablet 1 tablet  1 tablet Oral QHS PRN Jacquelynn Cree, PA-C      . sodium phosphate (FLEET) 7-19 GM/118ML enema 1 enema  1 enema Rectal Once PRN Pamela S Love, PA-C      . valACYclovir (VALTREX) tablet 500 mg  500 mg Oral Q M,W,F Jacquelynn Cree, PA-C   500 mg at 01/27/16 1315  . [START ON 02/06/2016] Vitamin D (Ergocalciferol) (DRISDOL) capsule 50,000 Units  50,000 Units Oral Q30 days Jacquelynn Cree, PA-C         Discharge Medications: Please see discharge summary for a list of discharge medications.  Relevant Imaging Results:  Relevant Lab Results:   Additional Information SS#: [id number]  Amada Jupiter, LCSW

## 2016-01-27 NOTE — Progress Notes (Signed)
[facility] PHYSICAL MEDICINE & REHABILITATION     PROGRESS NOTE    Subjective/Complaints: Sitting in chair. Just "ate" breakfast. Seems to be in good spirits. Denies any pain  ROS: limited due to aphasia/cognition    Objective: Vital Signs: Blood pressure 121/67, pulse 74, temperature 98.1 F (36.7 C), temperature source Oral, resp. rate 16, height  (1.499 m), weight 36.1 kg (79 lb 9.4 oz), SpO2 100 %. No results found. No results for input(s): WBC, HGB, HCT, PLT in the last 72 hours.  Recent Labs  01/24/16 1650 01/25/16 0614  NA 137 142  K 5.9* 4.6  CL 101 106  GLUCOSE 311* 89  BUN 29* 25*  CREATININE 1.30* 1.03*  CALCIUM 8.8* 8.8*   CBG (last 3)   Recent Labs  01/26/16 1629 01/26/16 2048 01/27/16 0654  GLUCAP 276* 303* 95    Wt Readings from Last 3 Encounters:  01/27/16 36.1 kg (79 lb 9.4 oz)  01/07/16 36.2 kg (79 lb 12.9 oz)  12/24/15 41.912 kg (92 lb 6.4 oz)    Physical Exam:  Constitutional: She appears well-developed. She appears cachectic.   HENT:  Head: Normocephalic and atraumatic.  Mouth/Throat: poor dentition.   Eyes: Conjunctivae and EOM are normal. Pupils are equal, round, and reactive to light.  Neck: Normal range of motion. Neck supple.  Cardiovascular: An irregularly irregular rhythm present.  Murmur heard. Respiratory: Effort normal and breath sounds normal. No respiratory distress. She has no wheezes.  GI: Soft. Bowel sounds are normal. She exhibits no distension. There is no tenderness.  Musculoskeletal: She exhibits tenderness. She exhibits no edema.  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 Right 1st and 2nd toes ischemic appearing also       Neurological: She is alert.  Exp>rec aphasia. Word substitution.    Psychiatric: Her affect is pleasant today       Assessment/Plan: 1. Aphasia, cognitive, and functional deficits  secondary to TBI which  require 3+ hours per day of interdisciplinary therapy in a comprehensive inpatient rehab setting. Physiatrist is providing close team supervision and 24 hour management of active medical problems listed below. Physiatrist and rehab team continue to assess barriers to discharge/monitor patient progress toward functional and medical goals.  Function:  Bathing Bathing position Bathing activity did not occur: Refused Position: Shower  Bathing parts Body parts bathed by patient: Right arm, Left arm, Chest, Abdomen, Front perineal area, Right upper leg, Left upper leg, Buttocks Body parts bathed by helper: Right lower leg, Left lower leg  Bathing assist Assist Level: Touching or steadying assistance(Pt > 75%)      Upper Body Dressing/Undressing Upper body dressing   What is the patient wearing?: Button up shirt, Bra Bra - Perfomed by patient: Thread/unthread right bra strap, Thread/unthread left bra strap Bra - Perfomed by helper: Hook/unhook bra (pull down sports bra) Pull over shirt/dress - Perfomed by patient: Thread/unthread right sleeve, Thread/unthread left sleeve, Put head through opening Pull over shirt/dress - Perfomed by helper: Pull shirt over trunk Button up shirt - Perfomed by patient: Thread/unthread right sleeve, Thread/unthread left sleeve, Pull shirt around back, Button/unbutton shirt      Upper body assist Assist Level: Touching or steadying assistance(Pt > 75%)   Set up : To obtain clothing/put away  Lower Body Dressing/Undressing Lower body dressing   What is the patient wearing?: Pants, Non-skid slipper socks, Shoes   Underwear - Performed by helper: Thread/unthread  right underwear leg, Thread/unthread left underwear leg, Pull underwear up/down Pants- Performed by patient: Thread/unthread right pants leg, Thread/unthread left pants leg, Pull pants up/down Pants- Performed by helper: Thread/unthread right pants leg, Pull pants up/down Non-skid slipper socks- Performed  by patient: Don/doff right sock, Don/doff left sock Non-skid slipper socks- Performed by helper: Don/doff right sock, Don/doff left sock       Shoes - Performed by helper: Don/doff right shoe, Don/doff left shoe, Fasten right, Fasten left          Lower body assist Assist for lower body dressing: Touching or steadying assistance (Pt > 75%)      Toileting Toileting   Toileting steps completed by patient: Performs perineal hygiene Toileting steps completed by helper: Adjust clothing prior to toileting, Adjust clothing after toileting Toileting Assistive Devices: Grab bar or rail  Toileting assist Assist level: Touching or steadying assistance (Pt.75%), Supervision or verbal cues   Transfers Chair/bed transfer   Chair/bed transfer method: Ambulatory Chair/bed transfer assist level: Touching or steadying assistance (Pt > 75%) Chair/bed transfer assistive device: Armrests     Locomotion Ambulation     Max distance: 50 Assist level: Touching or steadying assistance (Pt > 75%)   Wheelchair   Type: Manual Max wheelchair distance: 10 Assist Level: Dependent (Pt equals 0%)  Cognition Comprehension Comprehension assist level: Follows basic conversation/direction with extra time/assistive device  Expression Expression assist level: Expresses basic 25 - 49% of the time/requires cueing 50 - 75% of the time. Uses single words/gestures.  Social Interaction Social Interaction assist level: Interacts appropriately 25 - 49% of time - Needs frequent redirection.  Problem Solving Problem solving assist level: Solves basic less than 25% of the time - needs direction nearly all the time or does not effectively solve problems and may need a restraint for safety  Memory Memory assist level: Recognizes or recalls 25 - 49% of the time/requires cueing 50 - 75% of the time   Medical Problem List and Plan: 1. Aphasia, gait disorder, cognitive deficits secondary to Left subdural hematoma, left  superior inferior pubic ramus fracture status post fall  -continue CIR therapies. Behavior up and down 2. DVT Prophylaxis/Anticoagulation: Mechanical: Sequential compression devices, below knee Bilateral lower extremities 3. Pain Management: comfortable at present  -follow as she works with therapy 4. Mood: LCSW to follow for evaluation and support.  5. Neuropsych: This patient is not capable of making decisions on her own behalf.  6. Skin/Wound Care: Prevalon boots bilateral feet--elevate when in bed chair. Needs to have shoes on when out of bed.  7. Fluids/Electrolytes/Nutrition: Monitor I/O. Needs assistance with meals.   -need to push fluids 8. A fib with severe mitral stenosis and symptomatic AS:  Monitor for CP or orthostatic symptoms.  9. DM type 2: monitor BS ac/hs.  Hypoglycemic again today---dc PM lantus and maintain AM lantus   -increase am lantus to 15u  -intake inconsistent however which makes rx difficult---reviewed with patient today but she lacks insight   10. Malnutrition:  protein supplement/encourage po    11. CKD s/p renal transplant: ON cyclosporine, prednisone and cellcept.  12. PAD with claudication and gangrenous change left toes: conservative care for now---will need amputation at some point 13. HTN: Monitor BP bid. Continue norvasc 14. New onset seizures: On keppra 750mg  bid     LOS (Days) 11 A FACE TO FACE EVALUATION WAS PERFORMED  Kalin Amrhein T 01/27/2016 8:58 AM

## 2016-01-27 NOTE — Progress Notes (Signed)
Speech Language Pathology Daily Session Note  Patient Details  Name: [name] [name] MRN: [id number] Date of Birth: Dec 12, 1945  Today's Date: 01/27/2016 SLP Individual Time: 1300-1345 SLP Individual Time Calculation (min): 45 min  Short Term Goals: Week 2: SLP Short Term Goal 1 (Week 2): Pt to communicate basic needs via multimodal communication with min A multimodal cues. SLP Short Term Goal 2 (Week 2): Pt will answer yes/no questions with 50% accuracy with Max a multimodal cues in regards to wants/needs.  SLP Short Term Goal 3 (Week 2): Pt will demonstrate orientation to place, time and situation with Mod A multimodal cues.  SLP Short Term Goal 4 (Week 2): Pt will name functional items with 75% accuracy with min A multimodal cues.  SLP Short Term Goal 5 (Week 2): Pt will follow to 1-step directives with 90% accuracy with mod A multimodal cues. SLP Short Term Goal 6 (Week 2): Patient will consume current diet with minimal overt s/s of aspiration with supervision verbal cues for use of swallowing strategies.   Skilled Therapeutic Interventions: Skilled treatment session focused on cognitive-linguistic goals. Upon arrival, patient was awake while sitting upright in her wheelchair and appeared cheerful. Patient remained smiling while clinician was in the room and independently verbalized "shoes" in order for clinician to donn her Darco shoes and "how long?" to request amount of time she would be with the clinician. Patient was taken to the dayroom and was singing familiar songs while being propelled down the hallway. SLP facilitated session by providing Min A visual and verbal cues for decoding at the phrase level and for reading comprehension in regards to matching the phrase to an object from a field of 2 with 80% accuracy and Mod A question cues. SLP also facilitated session by providing Max-Total A multimodal cues to generate 2 items within a specific category. Patient left upright in wheelchair  at end of session with all needs within reach and quick release belt in place. Continue with current plan of care.    Function:   Cognition Comprehension Comprehension assist level: Understands basic 50 - 74% of the time/ requires cueing 25 - 49% of the time  Expression   Expression assist level: Expresses basic 25 - 49% of the time/requires cueing 50 - 75% of the time. Uses single words/gestures.  Social Interaction Social Interaction assist level: Interacts appropriately 25 - 49% of time - Needs frequent redirection.  Problem Solving Problem solving assist level: Solves basic less than 25% of the time - needs direction nearly all the time or does not effectively solve problems and may need a restraint for safety  Memory Memory assist level: Recognizes or recalls less than 25% of the time/requires cueing greater than 75% of the time    Pain No/Denies Pain   Therapy/Group: Individual Therapy  Hildagard Sobecki 01/27/2016, 3:59 PM

## 2016-01-28 ENCOUNTER — Inpatient Hospital Stay (HOSPITAL_COMMUNITY): Payer: Medicare Other | Admitting: Physical Therapy

## 2016-01-28 ENCOUNTER — Inpatient Hospital Stay (HOSPITAL_COMMUNITY): Payer: Medicare Other | Admitting: Speech Pathology

## 2016-01-28 ENCOUNTER — Inpatient Hospital Stay (HOSPITAL_COMMUNITY): Payer: Medicare Other

## 2016-01-28 DIAGNOSIS — S069X3S Unspecified intracranial injury with loss of consciousness of 1 hour to 5 hours 59 minutes, sequela: Secondary | ICD-10-CM

## 2016-01-28 LAB — GLUCOSE, CAPILLARY
GLUCOSE-CAPILLARY: 212 mg/dL — AB (ref 65–99)
GLUCOSE-CAPILLARY: 176 mg/dL — AB (ref 65–99)
GLUCOSE-CAPILLARY: 218 mg/dL — AB (ref 65–99)
Glucose-Capillary: 165 mg/dL — ABNORMAL HIGH (ref 65–99)

## 2016-01-28 MED ORDER — GLUCERNA SHAKE PO LIQD: 237.0000 mL | Freq: Three times a day (TID) | ORAL | Status: AC

## 2016-01-28 MED ORDER — HYPROMELLOSE (GONIOSCOPIC) 2.5 % OP SOLN
1.0000 [drp] | Freq: Four times a day (QID) | OPHTHALMIC | Status: AC | PRN
Start: 2016-01-28 — End: ?

## 2016-01-28 MED ORDER — SENNOSIDES-DOCUSATE SODIUM 8.6-50 MG PO TABS: 1.0000 | ORAL_TABLET | Freq: Every evening | ORAL | Status: AC | PRN

## 2016-01-28 NOTE — Progress Notes (Signed)
Occupational Therapy Session Note  Patient Details  Name: [name] [name] MRN: [id number] Date of Birth: [birth year]  Today's Date: 01/28/2016 OT Individual Time: 0700-0800 OT Individual Time Calculation (min): 60 min    Short Term Goals: Week 2:  OT Short Term Goal 1 (Week 2): STG=LTG secondary to ELOS  Skilled Therapeutic Interventions/Progress Updates:    Pt resting in bed upon arrival.  Pt incontinent of bowel but did not request to use the toilet until questioned.  Pt amb with RW (steady A) to bathroom with max verbal cues for RW safety awareness.  Pt required tot A for all toileting tasks this morning.  Pt declined bathing this morning since she did not have any clothing other than what she was wearing. Pt returned to sink in room to wash hands and complete oral care.  Pt required more than a reasonable amount of time to complete all tasks with max encouragement to initiate and complete tasks.  Pt engaged in sit<>stand and dynamic standing activities at sink to increase activity tolerance and endurance.  Focus on active participation, functional transfers and amb with RW, standing balance, task initiation, sequencing, and safety awareness to increase independence with BADLs and decrease burden of care.   Therapy Documentation Precautions:  Precautions Precautions: Fall Precaution Comments: bilateral DARCO shoes Restrictions Weight Bearing Restrictions: No LLE Weight Bearing: Weight bearing as tolerated Pain:  Pt initially denied pain but c/o increased pain/discomfort in bilateral feet when donning socks. ADL: ADL ADL Comments: refer to functional navigator  See Function Navigator for Current Functional Status.   Therapy/Group: Individual Therapy  Rich Brave 01/28/2016, 8:02 AM

## 2016-01-28 NOTE — Plan of Care (Signed)
Plan of Care Note  Pt's plan of care adjusted to 15 hours/7 days after speaking with care team and discussed with MD in team conference as pt currently unable to tolerate current therapy schedule with OT, PT, and SLP.

## 2016-01-28 NOTE — Progress Notes (Signed)
Speech Language Pathology Daily Session Note  Patient Details  Name: [name] [name] MRN: [id number] Date of Birth: 14-Jan-1946  Today's Date: 01/28/2016 SLP Individual Time: 1400-1430 SLP Individual Time Calculation (min): 30 min  Short Term Goals: Week 2: SLP Short Term Goal 1 (Week 2): Pt to communicate basic needs via multimodal communication with min A multimodal cues. SLP Short Term Goal 2 (Week 2): Pt will answer yes/no questions with 50% accuracy with Max a multimodal cues in regards to wants/needs.  SLP Short Term Goal 3 (Week 2): Pt will demonstrate orientation to place, time and situation with Mod A multimodal cues.  SLP Short Term Goal 4 (Week 2): Pt will name functional items with 75% accuracy with min A multimodal cues.  SLP Short Term Goal 5 (Week 2): Pt will follow to 1-step directives with 90% accuracy with mod A multimodal cues. SLP Short Term Goal 6 (Week 2): Patient will consume current diet with minimal overt s/s of aspiration with supervision verbal cues for use of swallowing strategies.   Skilled Therapeutic Interventions: Pt initially quite tearful and remained labile throughout the session requiring consistent episodes of redirection. Pt O x self, hospital and stroke. Increased echolalia noted this date. Pt able to provide DOB and age [facility]. Automatic spech tasks of singing along to familiar songs completed mod I with ~80% acc.    Function:  Eating Eating                 Cognition Comprehension Comprehension assist level: Understands basic 50 - 74% of the time/ requires cueing 25 - 49% of the time  Expression   Expression assist level: Expresses basic 25 - 49% of the time/requires cueing 50 - 75% of the time. Uses single words/gestures.  Social Interaction Social Interaction assist level: Interacts appropriately 25 - 49% of time - Needs frequent redirection.  Problem Solving Problem solving assist level: Solves basic less than 25% of the time - needs  direction nearly all the time or does not effectively solve problems and may need a restraint for safety  Memory Memory assist level: Recognizes or recalls less than 25% of the time/requires cueing greater than 75% of the time    Pain Pain Assessment Pain Assessment: Faces Faces Pain Scale: Hurts little more Pain Location: Foot Pain Orientation: Right;Left Pain Intervention(s): RN made aware;Distraction;Repositioned  Therapy/Group: Individual Therapy  Rocky Crafts MA, CCC-SLP 01/28/2016, 4:04 PM

## 2016-01-28 NOTE — Progress Notes (Signed)
Social Work Patient ID: [name] [name], female   DOB: 1945/12/04, 70 y.o.   MRN: [id number]   Anselm Pancoast, LCSW Social Worker Signed  Patient Care Conference 01/28/2016  4:10 PM    Expand All Collapse All   Inpatient RehabilitationTeam Conference and Plan of Care Update Date: 01/28/2016   Time: 2:30 PM     Patient Name: [name] [name]       Medical Record Number: [id number]  Date of Birth: 1946-08-17 Sex: Female         Room/Bed: 4W10C/4W10C-01 Payor Info: Payor: MEDICARE / Plan: MEDICARE PART A AND B / Product Type: *No Product type* /    Admitting Diagnosis: TBI  Admit Date/Time:  01/16/2016  4:06 PM Admission Comments: No comment available   Primary Diagnosis:  <principal problem not specified> Principal Problem: <principal problem not specified>    Patient Active Problem List     Diagnosis  Date Noted   .  TBI (traumatic brain injury) (HCC)  01/16/2016   .  Protein-calorie malnutrition, severe  01/14/2016   .  SAH (subarachnoid hemorrhage) (HCC)     .  Benign essential HTN     .  Seizures (HCC)  01/13/2016   .  Anticoagulated on Coumadin     .  Subdural hematoma (HCC)     .  Chronic atrial fibrillation (HCC)     .  History of renal transplant     .  PVD (peripheral vascular disease) (HCC)     .  Ischemic ulcer of both feet (HCC)     .  SDH (subdural hematoma) (HCC)  01/07/2016   .  Poorly controlled type 2 diabetes mellitus (HCC)  11/23/2015   .  Hypoglycemia  11/23/2015   .  Critical lower limb ischemia  11/20/2015   .  Encounter for therapeutic drug monitoring  10/06/2013   .  Colon cancer screening  06/01/2012   .  HSV  08/06/2010   .  PRIMARY HYPERPARATHYROIDISM  08/06/2010   .  ANEMIA  08/06/2010   .  DEPRESSION  08/06/2010   .  Mitral stenosis  08/06/2010   .  Atrial fibrillation (HCC)  08/06/2010   .  BRADYCARDIA-TACHYCARDIA SYNDROME  08/06/2010   .  DIASTOLIC DYSFUNCTION  08/06/2010   .  Cerebral artery occlusion with cerebral infarction (HCC)   08/06/2010   .  PVD  08/06/2010   .  RENAL FAILURE, END STAGE  08/06/2010   .  PACEMAKER, PERMANENT  08/06/2010     Expected Discharge Date: Expected Discharge Date:  (SNF)  Team Members Present: Physician leading conference: Dr. Faith Rogue Social Worker Present: Amada Jupiter, LCSW Nurse Present: Carmie End, RN PT Present: Bayard Hugger, PT OT Present: Roney Mans, OT;Ardis Rowan, COTA SLP Present: Feliberto Gottron, SLP PPS Coordinator present : Tora Duck, RN, [facility]        Current Status/Progress  Goal  Weekly Team Focus   Medical     behavior waxes and wanes. left foot unchanged   more consistent behavior and po intake   continued nutrition and poorly controlled DM   Bowel/Bladder     patient is continent and incontinet of bladder. Continent of bowel. LBM: 01/27/2016  cont x2  continent with timed toileting    Swallow/Nutrition/ Hydration     Dys. 3 textures with thin liquids, Full supervision  Mod I with least restrictive diet  increased use of swallowing strategies     ADL's  bathing/dressing-min A/inconsistent supervision; decreased activity tolerance, max encouragement to participate; max verbal cues for task initiation  supervision overall  activity tolerance, funcitonal amb with RW, BADLs, cognitive remediation   Mobility     supervision-min A  downgraded to min A except supervision bed mobility   functaionl mobility training, activity tolerance, cognitive remediation   Communication     Max-Total A  Mod A   basic expression of wants/needs, yes/no accuracy    Safety/Cognition/ Behavioral Observations    Max A  Mod A  attention, participation, orientation     Pain     patient c/o pain in her toes. scheduled tylenol. prevalon boots and SCDs irritate her legs/feet per patient  <3   continue with plan of care, continue to assess and treat qshift and PRN.    Skin     necrotic toes. Left worse than the right. paint with betadine daily   remain free from  infection/breakdown    continue to monitor toes, paint with betadine, and continue with routine pressure relief measures.     Rehab Goals Patient on target to meet rehab goals: Yes *See Care Plan and progress notes for long and short-term goals.    Barriers to Discharge:  see prior     Possible Resolutions to Barriers:   needs 24 hr supervision     Discharge Planning/Teaching Needs:   Change in d/c plan to SNF as family now unable to meet care needs.  NA    Team Discussion:    BS up and down.  Min assist with ambulation for safety.  RN clarifies that pt more incontinent then continent.  SW continues to work on SNF placement.   Revisions to Treatment Plan:    None    Continued Need for Acute Rehabilitation Level of Care: The patient requires daily medical management by a physician with specialized training in physical medicine and rehabilitation for the following conditions: Daily direction of a multidisciplinary physical rehabilitation program to ensure safe treatment while eliciting the highest outcome that is of practical value to the patient.: Yes Daily medical management of patient stability for increased activity during participation in an intensive rehabilitation regime.: Yes Daily analysis of laboratory values and/or radiology reports with any subsequent need for medication adjustment of medical intervention for : Post surgical problems;Neurological problems  [name] [name] 01/28/2016, 4:10 PM

## 2016-01-28 NOTE — Progress Notes (Signed)
[facility] PHYSICAL MEDICINE & REHABILITATION     PROGRESS NOTE    Subjective/Complaints: Up at bedside. No new complaints. Left foot tender. Ate better yesterday---am sugars high this morning.  ROS: limited due to aphasia/cognition    Objective: Vital Signs: Blood pressure 137/89, pulse 88, temperature 97.6 F (36.4 C), temperature source Oral, resp. rate 18, height  (1.499 m), weight 34.3 kg (75 lb 9.9 oz), SpO2 100 %. No results found. No results for input(s): WBC, HGB, HCT, PLT in the last 72 hours. No results for input(s): NA, K, CL, GLUCOSE, BUN, CREATININE, CALCIUM in the last 72 hours.  Invalid input(s): CO CBG (last 3)   Recent Labs  01/27/16 1620 01/27/16 2109 01/28/16 0648  GLUCAP 145* 245* 212*    Wt Readings from Last 3 Encounters:  01/28/16 34.3 kg (75 lb 9.9 oz)  01/07/16 36.2 kg (79 lb 12.9 oz)  12/24/15 41.912 kg (92 lb 6.4 oz)    Physical Exam:  Constitutional: She appears well-developed. She appears cachectic.   HENT:  Head: Normocephalic and atraumatic.  Mouth/Throat: poor dentition.   Eyes: Conjunctivae and EOM are normal. Pupils are equal, round, and reactive to light.  Neck: Normal range of motion. Neck supple.  Cardiovascular: An irregularly irregular rhythm present.  Murmur heard. Respiratory: Effort normal and breath sounds normal. No respiratory distress. She has no wheezes.  GI: Soft. Bowel sounds are normal. She exhibits no distension. There is no tenderness.  Musculoskeletal: She exhibits tenderness. She exhibits no edema.  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 Right 1st and 2nd toes ischemic appearing also       Neurological: She is alert.  Exp>rec aphasia. Word substitution.    Psychiatric: Her affect is pleasant today       Assessment/Plan: 1. Aphasia, cognitive, and functional deficits  secondary to TBI which require 3+ hours per day of  interdisciplinary therapy in a comprehensive inpatient rehab setting. Physiatrist is providing close team supervision and 24 hour management of active medical problems listed below. Physiatrist and rehab team continue to assess barriers to discharge/monitor patient progress toward functional and medical goals.  Function:  Bathing Bathing position Bathing activity did not occur: Refused Position: Shower  Bathing parts Body parts bathed by patient: Right arm, Left arm, Chest, Abdomen, Front perineal area, Right upper leg, Left upper leg, Buttocks Body parts bathed by helper: Right lower leg, Left lower leg  Bathing assist Assist Level: Touching or steadying assistance(Pt > 75%)      Upper Body Dressing/Undressing Upper body dressing   What is the patient wearing?: Button up shirt, Bra Bra - Perfomed by patient: Thread/unthread right bra strap, Thread/unthread left bra strap Bra - Perfomed by helper: Hook/unhook bra (pull down sports bra) Pull over shirt/dress - Perfomed by patient: Thread/unthread right sleeve, Thread/unthread left sleeve, Put head through opening Pull over shirt/dress - Perfomed by helper: Pull shirt over trunk Button up shirt - Perfomed by patient: Thread/unthread right sleeve, Thread/unthread left sleeve, Pull shirt around back, Button/unbutton shirt      Upper body assist Assist Level: Touching or steadying assistance(Pt > 75%)   Set up : To obtain clothing/put away  Lower Body Dressing/Undressing Lower body dressing   What is the patient wearing?: Pants, Non-skid slipper socks, Shoes   Underwear - Performed by helper: Thread/unthread right underwear leg, Thread/unthread left underwear leg, Pull underwear up/down Pants- Performed by patient: Thread/unthread right pants  leg, Thread/unthread left pants leg, Pull pants up/down Pants- Performed by helper: Thread/unthread right pants leg, Pull pants up/down Non-skid slipper socks- Performed by patient: Don/doff right  sock, Don/doff left sock Non-skid slipper socks- Performed by helper: Don/doff right sock, Don/doff left sock       Shoes - Performed by helper: Don/doff right shoe, Don/doff left shoe, Fasten right, Fasten left          Lower body assist Assist for lower body dressing: Touching or steadying assistance (Pt > 75%)      Toileting Toileting   Toileting steps completed by patient: Performs perineal hygiene Toileting steps completed by helper: Adjust clothing prior to toileting, Performs perineal hygiene, Adjust clothing after toileting Toileting Assistive Devices: Grab bar or rail  Toileting assist Assist level: Touching or steadying assistance (Pt.75%)   Transfers Chair/bed transfer   Chair/bed transfer method: Ambulatory Chair/bed transfer assist level: Touching or steadying assistance (Pt > 75%) Chair/bed transfer assistive device: Armrests     Locomotion Ambulation     Max distance: 50 Assist level: Touching or steadying assistance (Pt > 75%)   Wheelchair   Type: Manual Max wheelchair distance: 10 Assist Level: Dependent (Pt equals 0%)  Cognition Comprehension Comprehension assist level: Understands basic 50 - 74% of the time/ requires cueing 25 - 49% of the time  Expression Expression assist level: Expresses basic 25 - 49% of the time/requires cueing 50 - 75% of the time. Uses single words/gestures.  Social Interaction Social Interaction assist level: Interacts appropriately 25 - 49% of time - Needs frequent redirection.  Problem Solving Problem solving assist level: Solves basic less than 25% of the time - needs direction nearly all the time or does not effectively solve problems and may need a restraint for safety  Memory Memory assist level: Recognizes or recalls less than 25% of the time/requires cueing greater than 75% of the time   Medical Problem List and Plan: 1. Aphasia, gait disorder, cognitive deficits secondary to Left subdural hematoma, left superior  inferior pubic ramus fracture status post fall  -continue CIR therapies. Behavior up and down 2. DVT Prophylaxis/Anticoagulation: Mechanical: Sequential compression devices, below knee Bilateral lower extremities 3. Pain Management: comfortable at present  -follow as she works with therapy 4. Mood: LCSW to follow for evaluation and support.  5. Neuropsych: This patient is not capable of making decisions on her own behalf.  6. Skin/Wound Care: Prevalon boots bilateral feet--elevate when in bed chair. Needs to have shoes on when out of bed.  7. Fluids/Electrolytes/Nutrition: Monitor I/O. Needs assistance with meals.   -need to push fluids 8. A fib with severe mitral stenosis and symptomatic AS:  Monitor for CP or orthostatic symptoms.  9. DM type 2: monitor BS ac/hs.  Hypoglycemic again today---dc PM lantus and maintain AM lantus   -increased am lantus to 15u and stopped PM lantus due to AM hypoglycemia  -intake inconsistent however --(ate more yesterday PM).  Will not add back PM lantus until she is consistent  -cover with SSI 10. Malnutrition:  protein supplement/encourage po    11. CKD s/p renal transplant: ON cyclosporine, prednisone and cellcept.  12. PAD with claudication and gangrenous change left toes: conservative care for now---will need amputation at some point 13. HTN: Monitor BP bid. Continue norvasc 14. New onset seizures: On keppra 750mg  bid     LOS (Days) 12 A FACE TO FACE EVALUATION WAS PERFORMED  SWARTZ,ZACHARY T 01/28/2016 8:53 AM

## 2016-01-28 NOTE — Progress Notes (Signed)
Initial Nutrition Assessment  DOCUMENTATION CODES:   Severe malnutrition in context of chronic illness, Underweight  INTERVENTION:  Continue Glucerna Shake po TID, each supplement provides 220 kcal and 10 grams of protein.  Provide Magic cup TID with meals, each supplement provides 290 kcal and 9 grams of protein.  Encourage adequate PO intake.   NUTRITION DIAGNOSIS:   Malnutrition related to chronic illness as evidenced by severe depletion of muscle mass, severe depletion of body fat; ongoing  GOAL:   Patient will meet greater than or equal to 90% of their needs; progressing  MONITOR:   PO intake, Supplement acceptance, Labs, Weight trends, Skin, I & O's  REASON FOR ASSESSMENT:    (low bmi)    ASSESSMENT:   Pt with history fo HTN, CVA, severe mitral stenosis, and symptomatic AS, T2DM, CKD s/p renal transplant, PAF-on coumadin, gait disorder, PAD with severe claudication LLE and bilateral foot ulcers (with plans for partial foot amputation next week) who was admitted on 01/07/16 due to fall out of bed--she struck her head and EMS called but patient refused to go to the hospital till later that evening due to inability to get out of bed.   Meal completion has been varied from 10-75%. Pt was asleep during time of visit and would not wake. Pt has Glucerna Shake ordered and has been consuming most of them. Noted, pt has refused Prostat in the past. RD to continue with current orders. Labs and medications reviewed.   Diet Order:  DIET DYS 3 Room service appropriate?: Yes; Fluid consistency:: Thin  Skin:  Wound (see comment) (Pressure ulcer on toes, diabetic ulcer on ankle)  Last BM:  5/23  Height:   Ht Readings from Last 1 Encounters:  01/16/16  (1.499 m)    Weight:   Wt Readings from Last 1 Encounters:  01/28/16 75 lb 9.9 oz (34.3 kg)    Ideal Body Weight:  50 kg  BMI:  Body mass index is 15.26 kg/(m^2).  Estimated Nutritional Needs:   Kcal:  1500-1700    Protein:  75-85 grams  Fluid:  1.5 L  EDUCATION NEEDS:   No education needs identified at this time  Roslyn Smiling, MS, RD, LDN Pager # [phone] After hours/ weekend pager # [phone]

## 2016-01-28 NOTE — Progress Notes (Signed)
Speech Language Pathology Daily Session Notes  Patient Details  Name: [name] [name] MRN: [id number] Date of Birth: August 23, 1946  Today's Date: 01/28/2016  Session 1: SLP Individual Time: 0131-4388 SLP Individual Time Calculation (min): 30 min   Session 2: SLP Individual Time: 1500-1530 SLP Individual Time Calculation (min): 30 min  Short Term Goals: Week 2: SLP Short Term Goal 1 (Week 2): Pt to communicate basic needs via multimodal communication with min A multimodal cues. SLP Short Term Goal 2 (Week 2): Pt will answer yes/no questions with 50% accuracy with Max a multimodal cues in regards to wants/needs.  SLP Short Term Goal 3 (Week 2): Pt will demonstrate orientation to place, time and situation with Mod A multimodal cues.  SLP Short Term Goal 4 (Week 2): Pt will name functional items with 75% accuracy with min A multimodal cues.  SLP Short Term Goal 5 (Week 2): Pt will follow to 1-step directives with 90% accuracy with mod A multimodal cues. SLP Short Term Goal 6 (Week 2): Patient will consume current diet with minimal overt s/s of aspiration with supervision verbal cues for use of swallowing strategies.   Skilled Therapeutic Interventions:  Session 1: Skilled treatment session focused on cognitive-linguistic goals. Upon arrival, patient was awake while supine in bed. Patient sat EOB and transferred to the wheelchair while she was crying with extra time. SLP facilitated session by providing Max A verbal and visual cues for patient to follow 1 step directions during a sorting task. Patient communicating with clinician at the word level to express basic wants/needs. Patient transferred back to bed at end of session. Patient left with all needs within reach and alarm on. Continue with current plan of care.    Session 2: Skilled treatment session focused on cognitive-linguistic goals. Upon arrival, patient was awake while supine in bed. SLP facilitated session by providing Max A visual and  verbal cues for patient to utilize the call bell to request pain medication from the RN by reporting, "I hurt." Patient participated in a basic conversation with focus on wants/needs and biographical information. Patient reported she was at  [facility]" and was admitted "May 5th" (admitted 5/2) due to a "fall" with extra time and Min A question and verbal cues. Patient answered basic yes/no questions with 100% accuracy and reported she enjoyed the "kitchen." Patient agreeable to task in kitchen tomorrow with SLP and reported it was a "done deal." Patient left supine in bed and independently asked the clinician to "turn off the light." Patient left with alarm on and all needs within reach. Continue with current plan of care.   Function:  Cognition Comprehension Comprehension assist level: Understands basic 50 - 74% of the time/ requires cueing 25 - 49% of the time  Expression   Expression assist level: Expresses basic 25 - 49% of the time/requires cueing 50 - 75% of the time. Uses single words/gestures.  Social Interaction Social Interaction assist level: Interacts appropriately 25 - 49% of time - Needs frequent redirection.  Problem Solving Problem solving assist level: Solves basic less than 25% of the time - needs direction nearly all the time or does not effectively solve problems and may need a restraint for safety  Memory Memory assist level: Recognizes or recalls less than 25% of the time/requires cueing greater than 75% of the time    Pain No/Denies Pain   Therapy/Group: Individual Therapy  [name] [name] 01/28/2016, 12:46 PM

## 2016-01-28 NOTE — Progress Notes (Signed)
Physical Therapy Session Note  Patient Details  Name: [name] [name] MRN: [id number] Date of Birth: [birth year]  Today's Date: 01/28/2016 PT Individual Time: 0905-1000 PT Individual Time Calculation (min): 55 min   Short Term Goals: Week 2:  PT Short Term Goal 1 (Week 2): = LTGs due to anticipated LOS   Skilled Therapeutic Interventions/Progress Updates:   Session focused on functional mobility training, safety using RW, activity tolerance, and command following with basic sorting/counting tasks. Patient in wheelchair upon arrival. Patient demonstrating improved functional communication this date, independently stating, "finished," after toileting and giving accurate answers to basic questions 80% of time until she fatigued at end of session. Patient stated yes to need to toilet, performed sit > stand from wheelchair pushing up with BUE from arm rests without cues with supervision and ambulated to and from bathroom using RW with supervision and total cues for safe use of RW/keeping RW closer to body. Patient performed clothing management with steady assist but required total A to don new brief. Patient incontinent of 3 large stools and continent of bladder on toilet, RN notified. Patient performed hygiene in standing with setup assist and close supervision-steady assist. After seated rest, patient stood and ambulated to sink to perform hand hygiene with min A due to posterior LOB without UE support. In minimally distracting environment, patient performed ambulatory transfers using RW with min A and engaged in seated tabletop task of basic sorting/counting task with min cues. Progressed to sorting foods into meal type with max questioning cues until patient fatigued, perseverating on "tired." Patient left sitting wheelchair with quick release belt on and needs in reach.   Therapy Documentation Precautions:  Precautions Precautions: Fall Precaution Comments: bilateral DARCO  shoes Restrictions Weight Bearing Restrictions: No LLE Weight Bearing: Weight bearing as tolerated Pain: C/o pain in R ear, RN notified  See Function Navigator for Current Functional Status.   Therapy/Group: Individual Therapy  Kerney Elbe 01/28/2016, 9:55 AM

## 2016-01-28 NOTE — Patient Care Conference (Signed)
Inpatient RehabilitationTeam Conference and Plan of Care Update Date: 01/28/2016   Time: 2:30 PM    Patient Name: [name] [name]      Medical Record Number: [id number]  Date of Birth: [birth year] Sex: Female         Room/Bed: 4W10C/4W10C-01 Payor Info: Payor: MEDICARE / Plan: MEDICARE PART A AND B / Product Type: *No Product type* /    Admitting Diagnosis: TBI  Admit Date/Time:  01/16/2016  4:06 PM Admission Comments: No comment available   Primary Diagnosis:  <principal problem not specified> Principal Problem: <principal problem not specified>  Patient Active Problem List   Diagnosis Date Noted  . TBI (traumatic brain injury) (HCC) 01/16/2016  . Protein-calorie malnutrition, severe 01/14/2016  . SAH (subarachnoid hemorrhage) (HCC)   . Benign essential HTN   . Seizures (HCC) 01/13/2016  . Anticoagulated on Coumadin   . Subdural hematoma (HCC)   . Chronic atrial fibrillation (HCC)   . History of renal transplant   . PVD (peripheral vascular disease) (HCC)   . Ischemic ulcer of both feet (HCC)   . SDH (subdural hematoma) (HCC) 01/07/2016  . Poorly controlled type 2 diabetes mellitus (HCC) 11/23/2015  . Hypoglycemia 11/23/2015  . Critical lower limb ischemia 11/20/2015  . Encounter for therapeutic drug monitoring 10/06/2013  . Colon cancer screening 06/01/2012  . 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 PRIMARY HYPERPARATHYROIDISM 08/06/2010  . ANEMIA 08/06/2010  . DEPRESSION 08/06/2010  . Mitral stenosis 08/06/2010  . Atrial fibrillation (HCC) 08/06/2010  . 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 DIASTOLIC DYSFUNCTION 08/06/2010  . Cerebral artery occlusion with cerebral infarction (HCC) 08/06/2010  . PVD 08/06/2010  . RENAL FAILURE, END STAGE 08/06/2010  . PACEMAKER, PERMANENT 08/06/2010    Expected Discharge Date: Expected Discharge Date:  (SNF)  Team Members Present: Physician leading conference: Dr. Faith Rogue Social Worker Present: Amada Jupiter, LCSW Nurse Present: Carmie End, RN PT Present: Bayard Hugger, PT OT Present: Roney Mans, OT;Ardis Rowan, COTA SLP Present: Feliberto Gottron, SLP PPS Coordinator present : Tora Duck, RN, [facility]     Current Status/Progress Goal Weekly Team Focus  Medical   behavior waxes and wanes. left foot unchanged  more consistent behavior and po intake  continued nutrition and poorly controlled DM   Bowel/Bladder   patient is continent and incontinet of bladder. Continent of bowel. LBM: 01/27/2016  cont x2  continent with timed toileting    Swallow/Nutrition/ Hydration   Dys. 3 textures with thin liquids, Full supervision  Mod I with least restrictive diet  increased use of swallowing strategies    ADL's   bathing/dressing-min A/inconsistent supervision; decreased activity tolerance, max encouragement to participate; max verbal cues for task initiation  supervision overall  activity tolerance, funcitonal amb with RW, BADLs, cognitive remediation   Mobility   supervision-min A  downgraded to min A except supervision bed mobility  functaionl mobility training, activity tolerance, cognitive remediation   Communication   Max-Total A  Mod A   basic expression of wants/needs, yes/no accuracy   Safety/Cognition/ Behavioral Observations  Max A  Mod A  attention, participation, orientation    Pain   patient c/o pain in her toes. scheduled tylenol. prevalon boots and SCDs irritate her legs/feet per patient  <3   continue with plan of care, continue to assess and treat qshift and PRN.    Skin   necrotic toes. Left worse than the right. paint with betadine daily   remain free from infection/breakdown   continue to monitor toes, paint  with betadine, and continue with routine pressure relief measures.     Rehab Goals Patient on target to meet rehab goals: Yes *See Care Plan and progress notes for long and short-term goals.  Barriers to Discharge: see prior    Possible Resolutions to Barriers:  needs 24 hr supervision     Discharge Planning/Teaching Needs:  Change in d/c plan to SNF as family now unable to meet care needs.  NA   Team Discussion:  BS up and down.  Min assist with ambulation for safety.  RN clarifies that pt more incontinent then continent.  SW continues to work on SNF placement.  Revisions to Treatment Plan:  None   Continued Need for Acute Rehabilitation Level of Care: The patient requires daily medical management by a physician with specialized training in physical medicine and rehabilitation for the following conditions: Daily direction of a multidisciplinary physical rehabilitation program to ensure safe treatment while eliciting the highest outcome that is of practical value to the patient.: Yes Daily medical management of patient stability for increased activity during participation in an intensive rehabilitation regime.: Yes Daily analysis of laboratory values and/or radiology reports with any subsequent need for medication adjustment of medical intervention for : Post surgical problems;Neurological problems  Makynzi Eastland 01/28/2016, 4:10 PM

## 2016-01-28 NOTE — Plan of Care (Signed)
Problem: RH BOWEL ELIMINATION Goal: RH STG MANAGE BOWEL WITH ASSISTANCE STG Manage Bowel with min I Assistance.  Outcome: Not Progressing Pt has had incont episodes of bowel

## 2016-01-29 ENCOUNTER — Inpatient Hospital Stay (HOSPITAL_COMMUNITY): Payer: Medicare Other | Admitting: Speech Pathology

## 2016-01-29 ENCOUNTER — Inpatient Hospital Stay (HOSPITAL_COMMUNITY): Payer: Medicare Other | Admitting: Physical Therapy

## 2016-01-29 ENCOUNTER — Inpatient Hospital Stay (HOSPITAL_COMMUNITY): Payer: Medicare Other

## 2016-01-29 LAB — GLUCOSE, CAPILLARY
GLUCOSE-CAPILLARY: 149 mg/dL — AB (ref 65–99)
GLUCOSE-CAPILLARY: 261 mg/dL — AB (ref 65–99)
GLUCOSE-CAPILLARY: 209 mg/dL — AB (ref 65–99)
Glucose-Capillary: 135 mg/dL — ABNORMAL HIGH (ref 65–99)

## 2016-01-29 MED ORDER — LEVETIRACETAM 100 MG/ML PO SOLN
500.0000 mg | Freq: Two times a day (BID) | ORAL | Status: DC
  Administered 2016-01-29 – 2016-01-30 (×2): 500 mg via ORAL
  Filled 2016-01-29 (×2): qty 5

## 2016-01-29 MED ORDER — METOPROLOL TARTRATE 25 MG PO TABS: 25.0000 mg | ORAL_TABLET | Freq: Two times a day (BID) | ORAL | Status: AC

## 2016-01-29 MED ORDER — INSULIN GLARGINE 100 UNIT/ML ~~LOC~~ SOLN: 20.0000 [IU] | Freq: Every day | SUBCUTANEOUS | Status: DC

## 2016-01-29 MED ORDER — LEVETIRACETAM 100 MG/ML PO SOLN: 500.0000 mg | Freq: Two times a day (BID) | ORAL | Status: AC

## 2016-01-29 MED ORDER — POLYSACCHARIDE IRON COMPLEX 150 MG PO CAPS: 150.0000 mg | ORAL_CAPSULE | Freq: Every day | ORAL | Status: DC

## 2016-01-29 MED ORDER — INSULIN GLARGINE 100 UNIT/ML ~~LOC~~ SOLN
20.0000 [IU] | Freq: Every day | SUBCUTANEOUS | Status: DC
  Administered 2016-01-30: 20 [IU] via SUBCUTANEOUS
  Filled 2016-01-29: qty 0.2

## 2016-01-29 NOTE — Progress Notes (Signed)
Speech Language Pathology Daily Session Note  Patient Details  Name: [name] [name] MRN: [id number] Date of Birth: [birth year]  Today's Date: 01/29/2016 SLP Individual Time: 0930-1015 SLP Individual Time Calculation (min): 45 min  Short Term Goals: Week 2: SLP Short Term Goal 1 (Week 2): Pt to communicate basic needs via multimodal communication with min A multimodal cues. SLP Short Term Goal 2 (Week 2): Pt will answer yes/no questions with 50% accuracy with Max a multimodal cues in regards to wants/needs.  SLP Short Term Goal 3 (Week 2): Pt will demonstrate orientation to place, time and situation with Mod A multimodal cues.  SLP Short Term Goal 4 (Week 2): Pt will name functional items with 75% accuracy with min A multimodal cues.  SLP Short Term Goal 5 (Week 2): Pt will follow to 1-step directives with 90% accuracy with mod A multimodal cues. SLP Short Term Goal 6 (Week 2): Patient will consume current diet with minimal overt s/s of aspiration with supervision verbal cues for use of swallowing strategies.   Skilled Therapeutic Interventions: Pt initially somewhat groggy, but alerted during session. Pt was able to demonstrate recall of plan for today (established yesterday) with min semantic cueing. Attention to task for 20 minutes uninterrupted at mod I. Pt able to demonstrate focused attention despite intermittent distractions. Pt with some periods of echolalia, however pt appeared to have increased awareness of echolalia and added more content to repeated material. Pt  spontaneously said, "They are going to cut my feet off." She verified this was a reference to planned amputations. Pt reported feeling, "a little scared" about this upcoming procedure, but verbalized understanding of medical neccessity. Intermittent automatic speech noted with singing along to familiar songs. Pt was upbeat throughout the session and expressed appreciation for therapy.   Function:  Eating Eating                  Cognition Comprehension Comprehension assist level: Understands basic 75 - 89% of the time/ requires cueing 10 - 24% of the time  Expression   Expression assist level: Expresses basic 25 - 49% of the time/requires cueing 50 - 75% of the time. Uses single words/gestures.  Social Interaction Social Interaction assist level: Interacts appropriately 50 - 74% of the time - May be physically or verbally inappropriate.  Problem Solving Problem solving assist level: Solves basic 50 - 74% of the time/requires cueing 25 - 49% of the time  Memory Memory assist level: Recognizes or recalls 25 - 49% of the time/requires cueing 50 - 75% of the time    Pain Pain Assessment Pain Assessment: Faces Faces Pain Scale: Hurts a little bit Pain Location: Foot Pain Orientation: Right;Left Pain Onset: With Activity Pain Intervention(s): Repositioned;Distraction  Therapy/Group: Individual Therapy  KATELIN KUTSCH, CCC-SLP 01/29/2016, 12:26 PM

## 2016-01-29 NOTE — Progress Notes (Signed)
Physical Therapy Session Note  Patient Details  Name: [name] MRN: [id number] Date of Birth: 09-Apr-1946  Today's Date: 01/29/2016 PT Individual Time: 0800-0845 PT Individual Time Calculation (min): 45 min   Short Term Goals: Week 2:  PT Short Term Goal 1 (Week 2): = LTGs due to anticipated LOS  Skilled Therapeutic Interventions/Progress Updates:   Patient in wheelchair taking medications with RN upon arrival. Patient agreeable to assist with cleaning tables in day room in preparation for rehab party this afternoon. Patient performed multiple sit <> stand transfers from wheelchair with cues to push up from arm rests to engage in functional standing task of wiping down tables with supervision-min A overall. Patient able to stand up to 5 min initially but then required more frequent seated rest breaks due to c/o "tired" and "out of breath." Patient tolerated well and demonstrated excellent functional reach with one UE supported on table while cleaning and sidestepping to R and L. Patient fatigued toward end of session and completed task seated in wheelchair. Performed ambulatory transfer to bed using RW with steady assist and sit > supine with supervision. Patient left in bed with call bell and bed alarm on.     Therapy Documentation Precautions:  Precautions Precautions: Fall Precaution Comments: bilateral DARCO shoes Restrictions Weight Bearing Restrictions: No LLE Weight Bearing: Weight bearing as tolerated Pain: Pain Assessment Pain Assessment: No/denies pain   See Function Navigator for Current Functional Status.   Therapy/Group: Individual Therapy  Kerney Elbe 01/29/2016, 8:40 AM

## 2016-01-29 NOTE — Progress Notes (Signed)
Occupational Therapy Session Note  Patient Details  Name: [name] [name] MRN: [id number] Date of Birth: 24-Aug-1946  Today's Date: 01/29/2016 OT Individual Time: 0700-0800 OT Individual Time Calculation (min): 60 min    Short Term Goals: Week 2:  OT Short Term Goal 1 (Week 2): STG=LTG secondary to ELOS  Skilled Therapeutic Interventions/Progress Updates:    Pt resting in bed upon arrival and greeted therapist appropriately.  Pt agreeable to participating in therapy this morning.  Pt requested that her daughter be notified to bring in her clothing.  Therapist assured pt that her daughter would be contacted. Pt also informed that night staff had laundered her clothing for today.  Pt sat EOB and donned socks at supervision level before amb with RW to bathroom to use the toilet and bathe at shower level.  Pt incontinent of bowel and bladder but also voided and had a small bowel movement in toilet.  Pt completed bathing at shower level and dressing with sit<>stand from seat.  Pt required mod verbal cues for task initiation and sequencing with bathing tasks but initiated dressing tasks when presented with clothing.  Pt returned to room and ate her breakfast.  While eating pt said the word "mean" X 4. Pt then repeated "mean to me" X 3. When questioned as to who was mean to her, pt stated that she couldn't tell anyone.  RN and CSW notified.  Focus on active participation, bed mobility, functional amb with RW, BADL retraining, task initiation and sequencing, standing balance, and safety awareness to increase independence with BADLs.  Therapy Documentation Precautions:  Precautions Precautions: Fall Precaution Comments: bilateral DARCO shoes Restrictions Weight Bearing Restrictions: No LLE Weight Bearing: Weight bearing as tolerated Pain:  Pt grimaces when donning socks over her feet; RN aware and emotional support ADL: ADL ADL Comments: refer to functional navigator  See Function Navigator for  Current Functional Status.   Therapy/Group: Individual Therapy  Rich Brave 01/29/2016, 8:05 AM

## 2016-01-29 NOTE — Progress Notes (Signed)
Social Work Patient ID: [name] [name], female   DOB: June 13, 1946, 70 y.o.   MRN: [id number]   Have received several SNF bed offers today and have reviewed with pt's daughter, [name] [name] and with pt.  They have both agreed to accept bed at [facility] and will have pt transfer via ambulance tomorrow morning. Tx team aware.  Daxx Tiggs, LCSW

## 2016-01-29 NOTE — Discharge Summary (Signed)
NAMESHALIMAR, [name] NO.:  [id number]  MEDICAL RECORD NO.:  [id number]  LOCATION:  4W10C                        FACILITY:  [facility]  PHYSICIAN:  [name] [name], M.D.DATE OF BIRTH:  1946/02/13  DATE OF ADMISSION:  01/16/2016 DATE OF DISCHARGE:  01/30/2016                              DISCHARGE SUMMARY   DISCHARGE DIAGNOSES: 1. Traumatic brain injury. 2. Subdural hemorrhage. 3. Anemia. 4. Type 2 diabetes mellitus. 5. Chronic atrial fibrillation. 6. History of renal transplant. 7. New-onset seizures. 8. Hypertension. 9. Peripheral artery disease with LLE claudication. 10.Bilateral foot ulcers.   Recent Labs: BMET: BMP Latest Ref Rng 01/25/2016 01/24/2016 01/24/2016  Glucose 65 - 99 mg/dL 89 161(W) 960(A)  BUN 6 - 20 mg/dL 54(U) 98(J) 19(J)  Creatinine 0.44 - 1.00 mg/dL 4.78(G) 9.56(O) 1.30(Q)  Sodium 135 - 145 mmol/L 142 137 137  Potassium 3.5 - 5.1 mmol/L 4.6 5.9(H) 6.3(HH)  Chloride 101 - 111 mmol/L 106 101 102  CO2 22 - 32 mmol/L 24 24 23   Calcium 8.9 - 10.3 mg/dL 6.5(H) 8.4(O) 9.6(E)   CBC:  CBC Latest Ref Rng 01/21/2016 01/17/2016 01/13/2016  WBC 4.0 - 10.5 K/uL 10.6(H) 13.6(H) 9.6  Hemoglobin 12.0 - 15.0 g/dL 11.9(L) 13.7 12.3  Hematocrit 36.0 - 46.0 % 36.2 41.9 37.3  Platelets 150 - 400 K/uL 154 158 205     HISTORY OF PRESENT ILLNESS:  [name] is a 70 year old female with history of hypertension, CVA, type 2 diabetes mellitus, CKD status post renal transplant, severe mitral stenosis, PAF on Coumadin, PAD with severe claudication left lower extremity and bilateral foot ulcers who was admitted on Jan 07, 2016, after a fall out of bed. She was found to have minimally displaced left superior inferior pubic rami fracture with possible extension to the left acetabulum as well as left frontoparietal subdural hemorrhage, and small left parietal lobe subarachnoid hemorrhage.  She was evaluated by Dr. Jordan [name] who recommended reversal of Coumadin  and serial CT of head for monitoring.  Dr. Margarita [name] was consulted for input on pelvic fractures and recommended WBAT on LLE.  Follow up CT head of May 9 showed mild increase in edema, no significant improvement in [facility] and SDH. She did have worsening of symptoms with global aphasia and focal motor seizure. She was was placed on Keppra with resolution of symptoms. She has had some improvement in ability to answer yes/no questions, as well as the ability to tolerate activity.  CIR was recommended for followup therapies.    HOSPITAL COURSE:  [name] was admitted to Rehab on Jan 16, 2016, for inpatient therapies to consist of PT, OT, speech therapy, at least 3-5 hours, 5 days a week.  Past admission, physiatrist, rehab RN, and therapy team have worked together to provide customized collaborative interdisciplinary care.  The p.o. intake was monitored closely with assistance offered at meals as needed.  She has had issues with renal insufficiency and fluids were encouraged with meals. Nutritional supplements were also offered on t.i.d. basis.  She did have issues with hypoglycemia initially due to variable intake and pm dose Lantus was discontinued.  Blood sugars are currently starting to trend upwards and Lantus  was increased to 20 units today. Renal status has been monitored with serial checks and is showing some improvement in prerenal azotemia.  Blood pressures have have been reasonably controlled. Scheduled toileting has been ongoing to help with bladder incontinence.  She is continent of bowel.  She continues to have gangrenous changes in of toes on bilateral feet and this is being managed with conservative care. Bilateral DARCO shoes were ordered for protection of feet when up.  She will need followup with vascular surgeon after discharge.  Therapy has been ongoing, and she does continue to have limitation in ambulation due to left lower extremity pain.  Pain has currently been  managed with use of Tylenol on Qid basis.  Her overall anxiety levels have improved with improvement in cognition and ability to express basic needs. She does continue to have bouts of lability requiring redirection.  She continues to requires supervision to min assist and family has elected on SNF for further therapies.  She was discharged to [facility] on Jan 30, 2016.   REHAB COURSE: During the patient's stay in rehab, weekly team conferences were held to monitor the patient's progress, set goals, and discuss barriers to discharge.  At admission, the patient required moderate assist with basic self-care task and min assist with mobility.  She was found to have  global aphasia with language characterized by echolalia, perseveration, intermittent tearfulness, as well as tendency to say yes to all yes/no questions.  She was able to follow basic commands for  functional tasks only. During her stay in rehab, she has had improvement in activity tolerance, balance, as well as posture.  She is able to complete ADL tasks with supervision.  She is able to perform transfers at min assist and is able to ambulate 25 feet with supervision to min/guard assist.  She is able to attend to task for 20 minutes at modified independent level.  She is able to express basic needs with mod to max assist using single words and gestures.  She requires supervision to min assist with cues for comprehension.     DISCHARGE DIET:  Dysphagia 3 thin liquids.  SPECIAL INSTRUCTIONS: 1. Continue to monitor blood sugars a.c. and at bedtime basis and     titrate insulin as needed.  Use sliding scale insulin per protocol. 2. Toilet patient with 3-4 hours during the day. 3. Offer nutritional supplements t.i.d. 4. Use Darco for ambulation with dry dressing p.r.n. on the toes. Prevalon boots when in bed.    MEDICATIONS: 1. Tylenol 500 mg p.o. Ac/hs. 2. Norvasc 5 mg p.o. per day. 3. Optivar eyedrops 1 drop in both eyes  daily as needed. 4. Caltrate 1 p.o. b.i.d. 5. Digoxin 0.125 mg p.o. per day. 6. Ergocalciferol 50,000 units once a month on first day of the month. 7. Lantus 20 units subcu q.a.m. 8. Niferex 150 mg p.o. per day. 9. Keppra 500 mg p.o. b.i.d. 10.Metoprolol 25 mg p.o. b.i.d. 11.CellCept 500 mg p.o. b.i.d. 12.Multivitamin 1 per day. 13.Pravastatin 20 mg p.o. q.p.m. 14.Prednisone 5 mg p.o. per day. 15.Valtrex 500 mg p.o. every Monday, Wednesday, Friday. 16. Ensure one can tid between meals.    FOLLOWUP:  The patient to follow up with Dr. Faith Rogue in the 1-2 weeks for posthospital followup.  Follow up with Dr. Jordan [name] in the next 2- 3 weeks for repeat CT head and input on resuming Coumadin.  Follow up with Dr. Lorine Bears for routine check.  Follow up with Dr. Greggory Stallion  [facility] at [facility] in 2-3 weeks for workup/input on surgical needs for bilateral feet.     Delle Reining, P.A.   ______________________________ [name] [name], M.D.    PL/MEDQ  D:  01/29/2016  T:  01/29/2016  Job:  [id number]  cc:   Nadara Mustard, MD Lorine Bears, MD Kathaleen Maser. Pool, M.D. Hoy Finlay, M.D.

## 2016-01-29 NOTE — Progress Notes (Signed)
Speech Language Pathology Discharge Summary  Patient Details  Name: [name] [name] MRN: [id number] Date of Birth: [birth year]  Today's Date: 01/29/2016      Patient has met 4 of 4 long term goals.  Patient to discharge at overall Supervision level.  Reasons goals not met:     Clinical Impression/Discharge Summary: Pt demonstrates improving, but persistent expressive and receptive aphasia characterized by echolalia and perseveration. Pt also exhibits emotional lability, however she is easily redirectable. Emerging awareness of echolalia has benefited pt's ability to add content to repeated material.  Care Partner:  Caregiver Able to Provide Assistance: Yes  Type of Caregiver Assistance: Physical;Cognitive  Recommendation:  Skilled Nursing facility;24 hour supervision/assistance  Rationale for SLP Follow Up: Maximize functional communication;Maximize cognitive function and independence;Reduce caregiver burden     Reasons for discharge: Discharged from hospital   Patient/Family Agrees with Progress Made and Goals Achieved: Yes   Function:    Cognition Comprehension Comprehension assist level: Understands basic 75 - 89% of the time/ requires cueing 10 - 24% of the time  Expression   Expression assist level: Expresses basic 25 - 49% of the time/requires cueing 50 - 75% of the time. Uses single words/gestures.  Social Interaction Social Interaction assist level: Interacts appropriately 50 - 74% of the time - May be physically or verbally inappropriate.  Problem Solving Problem solving assist level: Solves basic 50 - 74% of the time/requires cueing 25 - 49% of the time  Memory Memory assist level: Recognizes or recalls 25 - 49% of the time/requires cueing 50 - 75% of the time   Vinetta Bergamo MA, CCC-SLP 01/29/2016, 4:29 PM

## 2016-01-29 NOTE — Progress Notes (Signed)
[place] PHYSICAL MEDICINE & REHABILITATION     PROGRESS NOTE    Subjective/Complaints: No new issues. Eating better as a whole but still inconsistent. Sitting in chair and in good spirits.  ROS: limited due to aphasia/cognition    Objective: Vital Signs: Blood pressure 153/64, pulse 64, temperature 97.7 F (36.5 C), temperature source Axillary, resp. rate 17, height  (1.499 m), weight 34 kg (74 lb 15.3 oz), SpO2 100 %. No results found. No results for input(s): WBC, HGB, HCT, PLT in the last 72 hours. No results for input(s): NA, K, CL, GLUCOSE, BUN, CREATININE, CALCIUM in the last 72 hours.  Invalid input(s): CO CBG (last 3)   Recent Labs  01/28/16 1708 01/28/16 2135 01/29/16 0636  GLUCAP 176* 218* 149*    Wt Readings from Last 3 Encounters:  01/29/16 34 kg (74 lb 15.3 oz)  01/07/16 36.2 kg (79 lb 12.9 oz)  12/24/15 41.912 kg (92 lb 6.4 oz)    Physical Exam:  Constitutional: She appears well-developed. She appears cachectic.   HENT:  Head: Normocephalic and atraumatic.  Mouth/Throat: poor dentition.   Eyes: Conjunctivae and EOM are normal. Pupils are equal, round, and reactive to light.  Neck: Normal range of motion. Neck supple.  Cardiovascular: An irregularly irregular rhythm present.  Murmur heard. Respiratory: Effort normal and breath sounds normal. No respiratory distress. She has no wheezes.  GI: Soft. Bowel sounds are normal. She exhibits no distension. There is no tenderness.  Musculoskeletal: She exhibits tenderness. She exhibits no edema.  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 Right 1st and 2nd toes ischemic appearing also       Neurological: She is alert.  Exp>rec aphasia.      Psychiatric: Her affect is bright today       Assessment/Plan: 1. Aphasia, cognitive, and functional deficits  secondary to TBI which require 3+ hours per day of interdisciplinary  therapy in a comprehensive inpatient rehab setting. Physiatrist is providing close team supervision and 24 hour management of active medical problems listed below. Physiatrist and rehab team continue to assess barriers to discharge/monitor patient progress toward functional and medical goals.  Function:  Bathing Bathing position Bathing activity did not occur: Refused Position: Shower  Bathing parts Body parts bathed by patient: Right arm, Left arm, Chest, Abdomen, Front perineal area, Right upper leg, Left upper leg, Buttocks Body parts bathed by helper: Right lower leg, Left lower leg  Bathing assist Assist Level: Touching or steadying assistance(Pt > 75%)      Upper Body Dressing/Undressing Upper body dressing   What is the patient wearing?: Button up shirt, Bra Bra - Perfomed by patient: Thread/unthread right bra strap, Thread/unthread left bra strap Bra - Perfomed by helper: Hook/unhook bra (pull down sports bra) Pull over shirt/dress - Perfomed by patient: Thread/unthread right sleeve, Thread/unthread left sleeve, Put head through opening Pull over shirt/dress - Perfomed by helper: Pull shirt over trunk Button up shirt - Perfomed by patient: Thread/unthread right sleeve, Thread/unthread left sleeve, Pull shirt around back, Button/unbutton shirt      Upper body assist Assist Level: Touching or steadying assistance(Pt > 75%)   Set up : To obtain clothing/put away  Lower Body Dressing/Undressing Lower body dressing   What is the patient wearing?: Pants, Non-skid slipper socks, Shoes   Underwear - Performed by helper: Thread/unthread right underwear leg, Thread/unthread left underwear leg, Pull underwear up/down Pants- Performed by patient: Thread/unthread  right pants leg, Thread/unthread left pants leg, Pull pants up/down Pants- Performed by helper: Thread/unthread right pants leg, Pull pants up/down Non-skid slipper socks- Performed by patient: Don/doff right sock, Don/doff left  sock Non-skid slipper socks- Performed by helper: Don/doff right sock, Don/doff left sock       Shoes - Performed by helper: Don/doff right shoe, Don/doff left shoe, Fasten right, Fasten left          Lower body assist Assist for lower body dressing: Touching or steadying assistance (Pt > 75%)      Toileting Toileting   Toileting steps completed by patient: Performs perineal hygiene, Adjust clothing prior to toileting Toileting steps completed by helper: Adjust clothing after toileting Toileting Assistive Devices: Grab bar or rail  Toileting assist Assist level: Touching or steadying assistance (Pt.75%)   Transfers Chair/bed transfer   Chair/bed transfer method: Ambulatory Chair/bed transfer assist level: Touching or steadying assistance (Pt > 75%) Chair/bed transfer assistive device: Armrests, Patent attorney     Max distance: 25 Assist level: Supervision or verbal cues   Wheelchair   Type: Manual Max wheelchair distance: 10 Assist Level: Dependent (Pt equals 0%)  Cognition Comprehension Comprehension assist level: Understands basic 50 - 74% of the time/ requires cueing 25 - 49% of the time  Expression Expression assist level: Expresses basic 25 - 49% of the time/requires cueing 50 - 75% of the time. Uses single words/gestures.  Social Interaction Social Interaction assist level: Interacts appropriately 25 - 49% of time - Needs frequent redirection.  Problem Solving Problem solving assist level: Solves basic less than 25% of the time - needs direction nearly all the time or does not effectively solve problems and may need a restraint for safety  Memory Memory assist level: Recognizes or recalls less than 25% of the time/requires cueing greater than 75% of the time   Medical Problem List and Plan: 1. Aphasia, gait disorder, cognitive deficits secondary to Left subdural hematoma, left superior inferior pubic ramus fracture status post fall  -continue CIR  therapies. Behavior waxes and wanes. Poor activity stamina  -SNF pending 2. DVT Prophylaxis/Anticoagulation: Mechanical: Sequential compression devices, below knee Bilateral lower extremities 3. Pain Management: comfortable at present  -follow as she works with therapy 4. Mood: LCSW to follow for evaluation and support.  5. Neuropsych: This patient is not capable of making decisions on her own behalf.  6. Skin/Wound Care: Prevalon boots bilateral feet--elevate when in bed chair. Needs to have shoes on when out of bed.  7. Fluids/Electrolytes/Nutrition: Monitor I/O. Needs assistance with meals.   - push fluids 8. A fib with severe mitral stenosis and symptomatic AS:  Monitor for CP or orthostatic symptoms.  9. DM type 2: monitor BS ac/hs.  Sugars generally better yesterday  -increase lantus to 20u in am  -intake inconsistent however --(ate more yesterday PM).  Will not add back PM lantus until she is consistent  -cover with SSI 10. Malnutrition:  protein supplement/encourage po    11. CKD s/p renal transplant: ON cyclosporine, prednisone and cellcept.  12. PAD with claudication and gangrenous change left toes: conservative care for now---will need amputation at some point 13. HTN: Monitor BP bid. Continue norvasc 14. New onset seizures: On keppra 750mg  bid---decrease to 500mg  bid.      LOS (Days) 13 A FACE TO FACE EVALUATION WAS PERFORMED  SWARTZ,ZACHARY T 01/29/2016 10:32 AM

## 2016-01-29 NOTE — Progress Notes (Signed)
Occupational Therapy Note  Patient Details  Name: EJA LATOUCHE MRN: [id number] Date of Birth: [birth year]  Today's Date: 01/29/2016 OT Individual Time: 1300-1330 OT Individual Time Calculation (min): 30 min   Pt denied pain Individual Therapy  Pt resting in bed and eagerly sat EOB in preparation for amb with RW to w/c.  Pt engaged in therapeutic activities in Day Room for patient Memorial Day party with DJ.  Pt immediately became more animated when she heard the music.  Patient engaged in BUE arm exercises to the music before standing X 2 to step in time to the music.  Pt verbalized numerous times that she was thoroughly enjoying "this kind of therapy." Pt remained in Day Room with staff present at the end of the session.   Lavone Neri [facility] 01/29/2016, 2:24 PM

## 2016-01-30 ENCOUNTER — Inpatient Hospital Stay (HOSPITAL_COMMUNITY): Payer: Medicare Other

## 2016-01-30 ENCOUNTER — Inpatient Hospital Stay (HOSPITAL_COMMUNITY): Payer: Medicare Other | Admitting: Physical Therapy

## 2016-01-30 LAB — GLUCOSE, CAPILLARY
GLUCOSE-CAPILLARY: 237 mg/dL — AB (ref 65–99)
Glucose-Capillary: 65 mg/dL (ref 65–99)

## 2016-01-30 NOTE — Progress Notes (Signed)
Social Work  Discharge Note  The overall goal for the admission was met for:   Discharge location: No - d/c plan changed to SNF as family unable to meet care needs  Length of Stay: No due to SNF bed search - LOS = 14 days  Discharge activity level: Yes - supervision/ min assist with max cues  Home/community participation: Yes  Services provided included: MD, RD, PT, OT, SLP, RN, TR, Pharmacy and SW  Financial Services: Medicare and Medicaid  Follow-up services arranged: Other: SNF at [facility]  Comments (or additional information):  Patient/Family verbalized understanding of follow-up arrangements: Yes  Individual responsible for coordination of the follow-up plan: daughter, Tammy  Confirmed correct DME delivered: NA    Crecencio Kwiatek

## 2016-01-30 NOTE — Progress Notes (Signed)
Physical Therapy Discharge Summary  Patient Details  Name: [name] [name] MRN: [id number] Date of Birth: April 25, 1946  Today's Date: 01/30/2016 PT Individual Time: 0800-0855 PT Individual Time Calculation (min): 55 min    Patient has met 5 of 5 long term goals due to improved activity tolerance, improved balance, improved postural control, increased strength, increased range of motion, decreased pain, ability to compensate for deficits, functional use of  right lower extremity and left lower extremity, improved attention, improved awareness and improved coordination.  Patient to discharge at a household ambulatory level House.   Patient's care partner unavailable to provide the necessary physical and cognitive assistance at discharge, therefore DC planned to SNF.  Reasons goals not met: NA  Recommendation:  Patient will benefit from ongoing skilled PT services in skilled nursing facility setting to continue to advance safe functional mobility, address ongoing impairments in weakness, standing balance, activity tolerance, cognition, and minimize fall risk.  Equipment: No equipment provided  Reasons for discharge: treatment goals met and discharge from hospital  Patient/family agrees with progress made and goals achieved: Yes  PT Discharge Precautions/Restrictions Precautions Precautions: Fall Precaution Comments: bilateral DARCO shoes Restrictions Weight Bearing Restrictions: Yes LLE Weight Bearing: Weight bearing as tolerated Pain Pain Assessment Pain Assessment: Faces Faces Pain Scale: Hurts a little bit Pain Type: Acute pain Pain Location: Foot Pain Orientation: Left;Right Pain Descriptors / Indicators: Aching Pain Onset: On-going Pain Intervention(s): Rest;Distraction Vision/Perception   No change from baseline  Cognition Overall Cognitive Status: Impaired/Different from baseline Arousal/Alertness: Awake/alert Orientation Level: Oriented to person;Oriented to  place Attention: Alternating Focused Attention: Appears intact Sustained Attention: Appears intact Alternating Attention: Impaired Alternating Attention Impairment: Functional basic Memory: Impaired Memory Impairment: Retrieval deficit Awareness: Impaired Awareness Impairment: Emergent impairment [employer] Scales of Cognitive Functioning: Automatic/appropriate Sensation Sensation Light Touch: Appears Intact Stereognosis: Not tested Hot/Cold: Appears Intact Proprioception: Appears Intact Coordination Gross Motor Movements are Fluid and Coordinated: No Fine Motor Movements are Fluid and Coordinated: Yes Coordination and Movement Description: limited by pain BLE Motor  Motor Motor: Within Functional Limits Motor - Skilled Clinical Observations: limited by pain/generalized deconditioning  Mobility Bed Mobility Bed Mobility: Rolling Right;Rolling Left;Sit to Supine;Supine to Sit Rolling Right: 5: Supervision Rolling Left: 5: Supervision Supine to Sit: 5: Supervision Transfers Sit to Stand: 5: Supervision;With upper extremity assist Stand to Sit: 5: Supervision;With upper extremity assist Stand Pivot Transfers: 4: Min assist Locomotion  Ambulation Ambulation: Yes Ambulation/Gait Assistance: 4: Min guard Ambulation Distance (Feet): 25 Feet Assistive device: Rolling walker Gait Gait: Yes Gait Pattern: Impaired Gait Pattern: Step-to pattern;Antalgic;Narrow base of support;Decreased stride length;Decreased weight shift to left Gait velocity: decreased Stairs / Additional Locomotion Stairs: Yes Stairs Assistance: 5: Supervision Stair Management Technique: Two rails;Step to pattern;Forwards Number of Stairs: 12 Height of Stairs: 3 (8 3", 4 6") Wheelchair Mobility Wheelchair Mobility: No  Trunk/Postural Assessment  Cervical Assessment Cervical Assessment: Within Functional Limits Thoracic Assessment Thoracic Assessment: Within Functional Limits Lumbar  Assessment Lumbar Assessment: Exceptions to [facility] (posterior pelvic tilt) Postural Control Postural Control: Within Functional Limits  Balance Static Sitting Balance Static Sitting - Level of Assistance: 5: Stand by assistance Dynamic Sitting Balance Dynamic Sitting - Level of Assistance: 5: Stand by assistance Static Standing Balance Static Standing - Level of Assistance: 5: Stand by assistance Dynamic Standing Balance Dynamic Standing - Level of Assistance: 4: Min assist Extremity Assessment  RUE Assessment RUE Assessment: Within Functional Limits LUE Assessment LUE Assessment: Within Functional Limits RLE Assessment RLE Assessment: Within Functional  Limits LLE Assessment LLE Assessment: Within Functional Limits   See Function Navigator for Current Functional Status.  Carney Living A 01/30/2016, 8:28 AM

## 2016-01-30 NOTE — Progress Notes (Signed)
Occupational Therapy Discharge Summary  Patient Details  Name: [name] [name] MRN: [id number] Date of Birth: 04/27/1946   Patient has met 12 of 12 long term goals due to improved activity tolerance, improved balance, postural control, ability to compensate for deficits, functional use of  RIGHT upper extremity, improved attention, improved awareness and improved coordination. Pt made steady progress with BADLs during this admission.  Pt occasionally requires encouragement to participate in therapy. Pt requires min verbal cues for safety awareness when ambulating with RW and during sit<>stand. Pt completes all BADLs at close supervision level with occasional verbal cues to task initiation.  Pt exhibits behaviors consistent with Rancho Level VII.  Patient to discharge at overall Supervision level.  Patient's care partner unavailable to provide the necessary physical and cognitive assistance at discharge.      Recommendation:  Patient will benefit from ongoing skilled OT services in skilled nursing facility setting to continue to advance functional skills in the area of BADL.  Equipment: No equipment provided; discharge to SNF  Reasons for discharge: treatment goals met  Patient/family agrees with progress made and goals achieved: Yes  OT Discharge   ADL ADL ADL Comments: refer to functional navigator Vision/Perception  Vision- History Baseline Vision/History: No visual deficits Patient Visual Report: No change from baseline Vision- Assessment Vision Assessment?: No apparent visual deficits  Cognition Overall Cognitive Status: Impaired/Different from baseline Arousal/Alertness: Awake/alert Orientation Level: Oriented to person;Oriented to place Attention: Alternating Focused Attention: Appears intact Sustained Attention: Appears intact Alternating Attention: Impaired Alternating Attention Impairment: Functional basic Memory: Impaired Memory Impairment: Retrieval  deficit Awareness: Impaired Awareness Impairment: Emergent impairment Rancho Los Amigos Scales of Cognitive Functioning: Automatic/appropriate Sensation Sensation Light Touch: Appears Intact Stereognosis: Not tested Hot/Cold: Appears Intact Proprioception: Appears Intact    Trunk/Postural Assessment  Cervical Assessment Cervical Assessment: Within Functional Limits Thoracic Assessment Thoracic Assessment: Within Functional Limits Lumbar Assessment Lumbar Assessment: Exceptions to WFL (posterior pelvic tilt) Postural Control Postural Control: Within Functional Limits  Balance Static Sitting Balance Static Sitting - Level of Assistance: 5: Stand by assistance Dynamic Sitting Balance Dynamic Sitting - Level of Assistance: 5: Stand by assistance Extremity/Trunk Assessment RUE Assessment RUE Assessment: Within Functional Limits LUE Assessment LUE Assessment: Within Functional Limits   See Function Navigator for Current Functional Status.  Lanier, Thomas Chappell 01/30/2016, 7:59 AM  

## 2016-01-30 NOTE — Progress Notes (Signed)
Occupational Therapy Session Note  Patient Details  Name: [name] [name] MRN: [id number] Date of Birth: Dec 22, 1945  Today's Date: 01/30/2016 OT Individual Time: 0700-0758 OT Individual Time Calculation (min): 58 min    Short Term Goals: Week 2:  OT Short Term Goal 1 (Week 2): STG=LTG secondary to ELOS  Skilled Therapeutic Interventions/Progress Updates:    Pt resting in bed upon arrival.  Pt engaged in BADL retraining including toileting, bathing at shower level, and dressing with sit<>stand from bench.   Pt amb with RW to bathroom to use toilet prior to transferring to tub bench for shower.  Pt initiated and completed all bathing/dressing tasks this morning at supervision level when presented with supplies and clothing.  Pt completed self feeding at supervision level. Focus on activity tolerance, standing balance, functional amb with RW, task initiation, and safety awareness.  Pt to discharge to SNF later in the day.   Therapy Documentation Precautions:  Precautions Precautions: Fall Precaution Comments: bilateral DARCO shoes Restrictions Weight Bearing Restrictions: Yes LLE Weight Bearing: Weight bearing as tolerated Pain:  Pt grimaces when her toes on LLE are touched during dressing tasks; emotional support ADL: ADL ADL Comments: refer to functional navigator  See Function Navigator for Current Functional Status.   Therapy/Group: Individual Therapy  Rich Brave 01/30/2016, 7:59 AM

## 2016-01-30 NOTE — Progress Notes (Signed)
[facility]     PROGRESS NOTE    Subjective/Complaints: No issues overnight. Pain seems reasonable. Left foot still sore.  ROS: limited due to aphasia/cognition    Objective: Vital Signs: Blood pressure 140/89, pulse 80, temperature 98.3 F (36.8 C), temperature source Oral, resp. rate 17, height 4\' 11"  (1.499 m), weight 34 kg (74 lb 15.3 oz), SpO2 100 %. No results found. No results for input(s): WBC, HGB, HCT, PLT in the last 72 hours. No results for input(s): NA, K, CL, GLUCOSE, BUN, CREATININE, CALCIUM in the last 72 hours.  Invalid input(s): CO CBG (last 3)   Recent Labs  01/29/16 1216 01/29/16 1633 01/29/16 2108  GLUCAP 135* 261* 209*    Wt Readings from Last 3 Encounters:  01/29/16 34 kg (74 lb 15.3 oz)  01/07/16 36.2 kg (79 lb 12.9 oz)  12/24/15 41.912 kg (92 lb 6.4 oz)    Physical Exam:  Constitutional: She appears well-developed. She appears cachectic.   HENT:  Head: Normocephalic and atraumatic.  Mouth/Throat: poor dentition.   Eyes: Conjunctivae and EOM are normal. Pupils are equal, round, and reactive to light.  Neck: Normal range of motion. Neck supple.  Cardiovascular: An irregularly irregular rhythm present.  Murmur heard. Respiratory: Effort normal and breath sounds normal. No respiratory distress. She has no wheezes.  GI: Soft. Bowel sounds are normal. She exhibits no distension. There is no tenderness.  Musculoskeletal: She exhibits tenderness. She exhibits no edema.  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 Right 1st and 2nd toes ischemic appearing also       Neurological: She is alert.  Exp>rec aphasia.      Psychiatric: Her affect is fairly bright today       Assessment/Plan: 1. Aphasia, cognitive, and functional deficits  secondary to TBI which require 3+ hours per day of interdisciplinary therapy in a comprehensive  inpatient rehab setting. Physiatrist is providing close team supervision and 24 hour management of active medical problems listed below. Physiatrist and rehab team continue to assess barriers to discharge/monitor patient progress toward functional and medical goals.  Function:  Bathing Bathing position Bathing activity did not occur: Refused Position: Shower  Bathing parts Body parts bathed by patient: Right arm, Left arm, Chest, Abdomen, Front perineal area, Right upper leg, Left upper leg, Buttocks Body parts bathed by helper: Right lower leg, Left lower leg  Bathing assist Assist Level: Touching or steadying assistance(Pt > 75%)      Upper Body Dressing/Undressing Upper body dressing   What is the patient wearing?: Button up shirt, Bra Bra - Perfomed by patient: Thread/unthread right bra strap, Thread/unthread left bra strap Bra - Perfomed by helper: Hook/unhook bra (pull down sports bra) Pull over shirt/dress - Perfomed by patient: Thread/unthread right sleeve, Thread/unthread left sleeve, Put head through opening Pull over shirt/dress - Perfomed by helper: Pull shirt over trunk Button up shirt - Perfomed by patient: Thread/unthread right sleeve, Thread/unthread left sleeve, Pull shirt around back, Button/unbutton shirt      Upper body assist Assist Level: Touching or steadying assistance(Pt > 75%)   Set up : To obtain clothing/put away  Lower Body Dressing/Undressing Lower body dressing   What is the patient wearing?: Pants, Non-skid slipper socks, Shoes   Underwear - Performed by helper: Thread/unthread right underwear leg, Thread/unthread left underwear leg, Pull underwear up/down Pants- Performed by patient: Thread/unthread right pants leg, Thread/unthread left pants leg,  Pull pants up/down Pants- Performed by helper: Thread/unthread right pants leg, Pull pants up/down Non-skid slipper socks- Performed by patient: Don/doff right sock, Don/doff left sock Non-skid slipper  socks- Performed by helper: Don/doff right sock, Don/doff left sock       Shoes - Performed by helper: Don/doff right shoe, Don/doff left shoe, Fasten right, Fasten left          Lower body assist Assist for lower body dressing: Touching or steadying assistance (Pt > 75%)      Toileting Toileting   Toileting steps completed by patient: Performs perineal hygiene, Adjust clothing prior to toileting Toileting steps completed by helper: Adjust clothing after toileting Toileting Assistive Devices: Grab bar or rail  Toileting assist Assist level: Touching or steadying assistance (Pt.75%)   Transfers Chair/bed transfer   Chair/bed transfer method: Ambulatory Chair/bed transfer assist level: Touching or steadying assistance (Pt > 75%) Chair/bed transfer assistive device: Armrests, Patent attorney     Max distance: 25 Assist level: Supervision or verbal cues   Wheelchair   Type: Manual Max wheelchair distance: 10 Assist Level: Dependent (Pt equals 0%)  Cognition Comprehension Comprehension assist level: Understands basic 75 - 89% of the time/ requires cueing 10 - 24% of the time  Expression Expression assist level: Expresses basic 25 - 49% of the time/requires cueing 50 - 75% of the time. Uses single words/gestures.  Social Interaction Social Interaction assist level: Interacts appropriately 50 - 74% of the time - May be physically or verbally inappropriate.  Problem Solving Problem solving assist level: Solves basic 25 - 49% of the time - needs direction more than half the time to initiate, plan or complete simple activities  Memory Memory assist level: Recognizes or recalls 25 - 49% of the time/requires cueing 50 - 75% of the time   Medical Problem List and Plan: 1. Aphasia, gait disorder, cognitive deficits secondary to Left subdural hematoma, left superior inferior pubic ramus fracture status post fall  -continue CIR therapies. Behavior waxes and wanes.    -SNF today---follow up with me in 4-6 weeks 2. DVT Prophylaxis/Anticoagulation: Mechanical: Sequential compression devices, below knee Bilateral lower extremities 3. Pain Management: comfortable at present  -follow as she works with therapy 4. Mood: LCSW to follow for evaluation and support.  5. Neuropsych: This patient is not capable of making decisions on her own behalf.  6. Skin/Wound Care: Prevalon boots bilateral feet--elevate when in bed chair. Needs to have shoes on when out of bed.  7. Fluids/Electrolytes/Nutrition: Monitor I/O. Needs assistance with meals.   - push fluids 8. A fib with severe mitral stenosis and symptomatic AS:  Monitor for CP or orthostatic symptoms.  9. DM type 2: monitor BS ac/hs.  Sugars generally better yesterday  -increased lantus to 20u in am  -regimen will need more adjustment given her inconsistent intake  -cover with SSI 10. Malnutrition:  protein supplement/encourage po    11. CKD s/p renal transplant: ON cyclosporine, prednisone and cellcept.  12. PAD with claudication and gangrenous change left toes: conservative care for now---will need amputation at some point 13. HTN: Monitor BP bid. Continue norvasc 14. New onset seizures: On keppra  bid---decreased to  bid.      LOS (Days) 14 A FACE TO FACE EVALUATION WAS PERFORMED  Arlina Sabina T 01/30/2016 7:46 AM

## 2016-02-11 ENCOUNTER — Encounter: Payer: Medicare Other | Admitting: Physical Medicine & Rehabilitation

## 2016-02-11 ENCOUNTER — Telehealth: Payer: Self-pay

## 2016-02-11 NOTE — Telephone Encounter (Signed)
Pt's daughter called to let us know pt is having issues being cleared for her toe ampuation surgery due to being off of her Coumadin, and has a risk due to her hx of stroke. Advised pt to call Dr. Jordan Likes to get clearance to resume coumadin or to call the coumadin clinic.

## 2016-02-17 ENCOUNTER — Telehealth: Payer: Self-pay | Admitting: Cardiovascular Disease

## 2016-02-17 NOTE — Telephone Encounter (Signed)
Please call re multiple attempts via fax for March and April orders . Please call to discuss .

## 2016-02-17 NOTE — Telephone Encounter (Signed)
S/w Rosey Bath at [facility] who states they have faxed orders needing signature but never received back. Asked Rosey Bath to re-fax to [phone] and will have MD sign.

## 2016-02-17 NOTE — Telephone Encounter (Signed)
Left message for Rosey Bath that faxed orders not received.

## 2016-02-18 ENCOUNTER — Telehealth: Payer: Self-pay

## 2016-02-18 ENCOUNTER — Encounter
Payer: No Typology Code available for payment source | Attending: Physical Medicine & Rehabilitation | Admitting: Physical Medicine & Rehabilitation

## 2016-02-18 ENCOUNTER — Encounter: Payer: Self-pay | Admitting: Physical Medicine & Rehabilitation

## 2016-02-18 ENCOUNTER — Ambulatory Visit (INDEPENDENT_AMBULATORY_CARE_PROVIDER_SITE_OTHER): Payer: Medicare Other | Admitting: Cardiovascular Disease

## 2016-02-18 VITALS — BP 131/78 | HR 62 | Ht 60.0 in | Wt 74.0 lb

## 2016-02-18 VITALS — BP 147/103 | HR 53 | Resp 16

## 2016-02-18 DIAGNOSIS — R569 Unspecified convulsions: Secondary | ICD-10-CM | POA: Insufficient documentation

## 2016-02-18 DIAGNOSIS — E7800 Pure hypercholesterolemia, unspecified: Secondary | ICD-10-CM | POA: Insufficient documentation

## 2016-02-18 DIAGNOSIS — I132 Hypertensive heart and chronic kidney disease with heart failure and with stage 5 chronic kidney disease, or end stage renal disease: Secondary | ICD-10-CM | POA: Insufficient documentation

## 2016-02-18 DIAGNOSIS — S069X0A Unspecified intracranial injury without loss of consciousness, initial encounter: Secondary | ICD-10-CM | POA: Insufficient documentation

## 2016-02-18 DIAGNOSIS — I509 Heart failure, unspecified: Secondary | ICD-10-CM | POA: Diagnosis not present

## 2016-02-18 DIAGNOSIS — E1122 Type 2 diabetes mellitus with diabetic chronic kidney disease: Secondary | ICD-10-CM | POA: Diagnosis not present

## 2016-02-18 DIAGNOSIS — Z992 Dependence on renal dialysis: Secondary | ICD-10-CM | POA: Insufficient documentation

## 2016-02-18 DIAGNOSIS — I739 Peripheral vascular disease, unspecified: Secondary | ICD-10-CM

## 2016-02-18 DIAGNOSIS — I050 Rheumatic mitral stenosis: Secondary | ICD-10-CM | POA: Diagnosis not present

## 2016-02-18 DIAGNOSIS — N186 End stage renal disease: Secondary | ICD-10-CM | POA: Diagnosis not present

## 2016-02-18 DIAGNOSIS — G8929 Other chronic pain: Secondary | ICD-10-CM | POA: Diagnosis not present

## 2016-02-18 DIAGNOSIS — F329 Major depressive disorder, single episode, unspecified: Secondary | ICD-10-CM | POA: Insufficient documentation

## 2016-02-18 DIAGNOSIS — I482 Chronic atrial fibrillation: Secondary | ICD-10-CM | POA: Diagnosis not present

## 2016-02-18 DIAGNOSIS — R269 Unspecified abnormalities of gait and mobility: Secondary | ICD-10-CM | POA: Insufficient documentation

## 2016-02-18 DIAGNOSIS — S069X3S Unspecified intracranial injury with loss of consciousness of 1 hour to 5 hours 59 minutes, sequela: Secondary | ICD-10-CM

## 2016-02-18 DIAGNOSIS — Z87891 Personal history of nicotine dependence: Secondary | ICD-10-CM | POA: Diagnosis not present

## 2016-02-18 DIAGNOSIS — I998 Other disorder of circulatory system: Secondary | ICD-10-CM | POA: Diagnosis not present

## 2016-02-18 DIAGNOSIS — I70229 Atherosclerosis of native arteries of extremities with rest pain, unspecified extremity: Secondary | ICD-10-CM

## 2016-02-18 DIAGNOSIS — Z8673 Personal history of transient ischemic attack (TIA), and cerebral infarction without residual deficits: Secondary | ICD-10-CM | POA: Diagnosis not present

## 2016-02-18 DIAGNOSIS — R4189 Other symptoms and signs involving cognitive functions and awareness: Secondary | ICD-10-CM | POA: Insufficient documentation

## 2016-02-18 DIAGNOSIS — Z940 Kidney transplant status: Secondary | ICD-10-CM | POA: Diagnosis not present

## 2016-02-18 DIAGNOSIS — I6200 Nontraumatic subdural hemorrhage, unspecified: Secondary | ICD-10-CM | POA: Insufficient documentation

## 2016-02-18 DIAGNOSIS — Z8719 Personal history of other diseases of the digestive system: Secondary | ICD-10-CM | POA: Diagnosis not present

## 2016-02-18 DIAGNOSIS — K219 Gastro-esophageal reflux disease without esophagitis: Secondary | ICD-10-CM | POA: Insufficient documentation

## 2016-02-18 NOTE — Telephone Encounter (Signed)
Spoke with pt and daughter, made them aware [name] has left a message with Dr. Pernell Dupre and gotten clearance from neurology for her procedure. They aware they should here from Dr. Jarvis Morgan office soon.  They verbalized understanding, no additional questions at this time.

## 2016-02-18 NOTE — Patient Instructions (Signed)
Medication Instructions:  Your physician recommends that you continue on your current medications as directed. Please refer to the Current Medication list given to you today.   Labwork: none  Testing/Procedures: none  Follow-Up: Your physician recommends that you schedule a follow-up appointment in: 2 months with [name].   We will be in touch with you today to discuss medications.   Any Other Special Instructions Will Be Listed Below (If Applicable).     If you need a refill on your cardiac medications before your next appointment, please call your pharmacy.

## 2016-02-18 NOTE — Progress Notes (Signed)
Cardiology Office Note   Date:  02/18/2016   ID:  [name] [name], DOB 21-Feb-1946, MRN [id number]  PCP:  [name], NP  Cardiologist:  Dr. Excell Seltzer.   No chief complaint on file.     History of Present Illness: [name] [name] is a 70 y.o. female who Is here today for a follow-up visit regarding  critical limb ischemia.  She has known history of permanent atrial fibrillation, renal failure status post kidney transplant, mitral stenosis, and lower extremity peripheral arterial disease. She was seen by me in 2014 for severe left calf claudication associated with ulceration involving the left foot.  She underwent angiography in 11/2012 which confirmed severe calcified disease in the left popliteal artery as well as the SFA. She underwent a complex procedure with atherectomy of the left popliteal artery and the left SFA. There was also one-vessel runoff below the knee with only patent anterior tibial artery which had diffuse 60% disease.The ulceration healed at that time  She was seen in March of this year for recurrent ulceration on left 2 small toes.  Noninvasive vascular evaluation showed that the ABIs could not be calculated due to noncompressible vessels.   I proceeded with angiography which showed patent left SFA and popliteal arteries with only moderate stenosis in the popliteal artery. Below the knee, the anterior tibial artery was occluded at the ostium. The peroneal artery was only patent vessel to the foot . There was 80% stenosis in the TP trunk at the origin of the anterior tibial artery. I performed successful balloon angioplasty on the TP trunk but was not able to cross the short occlusion in the anterior tibial artery. On the right side, there was significant heavily calcified right SFA stenosis.  The patient did not have good healing of the left side and thus I referred her to see Dr. Hoy Finlay to consider pedal axis revascularization. She was scheduled for the  procedure. However, just before that she fell at home with head trauma causing subdural hematoma. She was hospitalized for a long time and was finally discharged to rehabilitation. All her left toes are gangrenous at the present time. Her pain is reasonably controlled with medications.  I was able to get in touch with Dr. Dutch Quint today regarding safety of anticoagulation.    Past Medical History  Diagnosis Date  . Hypertension   . Stroke [facility]) December 2011    left MCA distribution  . Permanent atrial fibrillation (HCC)     coumadin clinic at [facility]. Has previously required Lovenox->Coumadin bridge.  . Severe mitral stenosis by prior echocardiogram     last echo 07/2012  . Aortic stenosis   . Symptomatic bradycardia     s/p pacer  . Depression   . HSV (herpes simplex virus) infection   . Anemia   . Hyperparathyroidism, primary (HCC)     s/p parathyroidectomy  . PAD (peripheral artery disease) (HCC)     a. s/p RLE angioplasty with [name]. b. (by [name])  L popliteral artery orbital atherectomy and balloon angioplasty & L SFA orbital atherectomy and balloon angioplasty 11/30/12.  [name] PVD (peripheral vascular disease) (HCC)   . Presence of permanent cardiac pacemaker   . High cholesterol   . CHF (congestive heart failure) (HCC)   . TIA (transient ischemic attack) 08/2009    [name] 09/05/2009  . Atrial thrombus (HCC)     appendage/notes 09/05/2009  . Type II diabetes mellitus (HCC)   . History  of blood transfusion     related to "kidney transplant"  . Chronic kidney disease     s/p renal transplant;  follows with [name]  . GERD (gastroesophageal reflux disease)     hx  . History of stomach ulcers   . Diabetic foot ulcer (HCC)     "left is worse than right" (11/20/2015)    Past Surgical History  Procedure Laterality Date  . Insert / replace / remove pacemaker    . Kidney transplant  December 1999  . Abdominal angiogram  11/30/2012    ABDOMINAL AORTIC  ANGIOGRAM   DR ARDIA  . Dialysis fistula creation    . Abdominal aortagram N/A 11/30/2012    Procedure: ABDOMINAL [name];  Surgeon: [name], MD;  Location: [facility] CATH LAB;  Service: Cardiovascular;  Laterality: N/A;  . Peripheral vascular catheterization N/A 11/20/2015    Procedure: Abdominal Aortogram;  Surgeon: [name], MD;  Location: [facility] INVASIVE CV LAB;  Service: Cardiovascular;  Laterality: N/A;  . Peripheral vascular catheterization Bilateral 11/20/2015    Procedure: Lower Extremity Angiography;  Surgeon: [name], MD;  Location: [facility] INVASIVE CV LAB;  Service: Cardiovascular;  Laterality: Bilateral;  . Peripheral vascular catheterization  11/20/2015    Procedure: Peripheral Vascular Balloon Angioplasty;  Surgeon: [name], MD;  Location: [facility] INVASIVE CV LAB;  Service: Cardiovascular;;  . Tonsillectomy    . Appendectomy    . Hernia repair    . Umbilical hernia repair       Current Outpatient Prescriptions  Medication Sig Dispense Refill  . acetaminophen (TYLENOL) 500 MG tablet Take 500 mg by mouth every 6 (six) hours as needed. For pain    . amLODipine (NORVASC) 5 MG tablet Take 1 tablet (5 mg total) by mouth daily. 90 tablet 0  . azelastine (OPTIVAR) 0.05 % ophthalmic solution Place 1 drop into both eyes 2 (two) times daily as needed (for dry eyes).     [name] b complex-vitamin c-folic acid (NEPHRO-VITE) 0.8 MG TABS Take 1 tablet by mouth daily.     . Calcium Carbonate-Vitamin D (CALTRATE 600+D) 600-400 MG-UNIT per tablet Take 1 tablet by mouth 2 (two) times daily.     . cycloSPORINE modified (NEORAL/GENGRAF) 25 MG capsule Take 25 mg by mouth 2 (two) times daily.      . digoxin (LANOXIN) 0.125 MG tablet Take 0.125 mg by mouth daily.     . ergocalciferol (VITAMIN D2) 50000 UNITS capsule Take 50,000 Units by mouth every 30 (thirty) days. First of the month    . feeding supplement, GLUCERNA SHAKE, (GLUCERNA SHAKE) LIQD Take 237 mLs by mouth 3 (three) times daily  with meals.  0  . hydroxypropyl methylcellulose / hypromellose (ISOPTO TEARS / GONIOVISC) 2.5 % ophthalmic solution Place 1 drop into both eyes 4 (four) times daily as needed (dry eyes). 15 mL 12  . insulin aspart (NOVOLOG) 100 UNIT/ML injection Inject into the skin. Sliding Scale    . iron polysaccharides (NIFEREX) 150 MG capsule Take 150 mg by mouth daily.     [name] levETIRAcetam (KEPPRA) 100 MG/ML solution Take 5 mLs (500 mg total) by mouth 2 (two) times daily. 473 mL 12  . lidocaine (XYLOCAINE) 5 % ointment Apply 1 application topically as needed. For foot pain 35.44 g 0  . metoprolol tartrate (LOPRESSOR) 25 MG tablet Take 1 tablet (25 mg total) by mouth 2 (two) times daily.    . Multiple Vitamins-Minerals (ONE-A-DAY WOMENS 50 PLUS PO) Take  1 tablet by mouth daily.    . mycophenolate (CELLCEPT) 250 MG capsule Take 500 mg by mouth 2 (two) times daily.     . ONE TOUCH ULTRA TEST test strip TEST BLOOD SUGAR 3 TIMES A DAY (E11.9)  3  . pravastatin (PRAVACHOL) 20 MG tablet Take 1 tablet (20 mg total) by mouth every evening. 90 tablet 3  . predniSONE (DELTASONE) 5 MG tablet Take 5 mg by mouth daily.      [name] senna-docusate (SENOKOT-S) 8.6-50 MG tablet Take 1 tablet by mouth at bedtime as needed for mild constipation.    . traMADol (ULTRAM) 50 MG tablet Take 1 tablet by mouth as needed.  0  . valACYclovir (VALTREX) 500 MG tablet Take 500 mg by mouth every Monday, Wednesday, and Friday. Only on Monday, Wednesday and Friday     No current facility-administered medications for this visit.    Allergies:   Bactrim; Aspirin; Shellfish allergy; Codeine; Latex; Penicillins; and Tape    Social History:  The patient  reports that she has quit smoking. Her smoking use included Cigarettes. She has a 2.5 pack-year smoking history. She has never used smokeless tobacco. She reports that she does not drink alcohol or use illicit drugs.   Family History:  The patient's family history includes Kidney disease in her  brother and sister. There is no history of Heart attack or Stroke. She was adopted.    ROS:  Please see the history of present illness.   Otherwise, review of systems are positive for none.   All other systems are reviewed and negative.    PHYSICAL EXAM: VS:  BP 131/78 mmHg  Pulse 62  Ht 5' (1.524 m)  Wt 74 lb (33.566 kg)  BMI 14.45 kg/m2 , BMI Body mass index is 14.45 kg/(m^2). GEN: Well nourished, well developed, in no acute distress HEENT: normal Neck: no JVD, carotid bruits, or masses Cardiac: RRR; no  rubs, or gallops,no edema . There is a 2/6 systolic murmur at the left sternal border Respiratory:  clear to auscultation bilaterally, normal work of breathing GI: soft, nontender, nondistended, + BS MS: no deformity or atrophy Skin: warm and dry, no rash Neuro:  Strength and sensation are intact Psych: euthymic mood, full affect Vascular: Femoral pulses are normal. Distal pulses are not palpable.  Gangrenous left toes.  EKG:  EKG is not ordered today.    Recent Labs: 01/08/2016: Magnesium 1.4* 01/17/2016: ALT 136* 01/21/2016: Hemoglobin 11.9*; Platelets 154 01/25/2016: BUN 25*; Creatinine, Ser 1.03*; Potassium 4.6; Sodium 142    Lipid Panel    Component Value Date/Time   CHOL * 08/19/2009 0547    221        ATP III CLASSIFICATION:  <200     mg/dL   Desirable  161-096  mg/dL   Borderline High  >=045    mg/dL   High          TRIG 87 08/19/2009 0547   HDL 113 RESULTS CONFIRMED BY MANUAL DILUTION 08/19/2009 0547   CHOLHDL 2.0 08/19/2009 0547   VLDL 17 08/19/2009 0547   LDLCALC  08/19/2009 0547    91        Total Cholesterol/HDL:CHD Risk Coronary Heart Disease Risk Table                     Men   Women  1/2 Average Risk   3.4   3.3  Average Risk       5.0   4.4  2 X Average Risk   9.6   7.1  3 X Average Risk  23.4   11.0        Use the calculated Patient Ratio above and the CHD Risk Table to determine the patient's CHD Risk.        ATP III CLASSIFICATION  (LDL):  <100     mg/dL   Optimal  098-119  mg/dL   Near or Above                    Optimal  130-159  mg/dL   Borderline  147-829  mg/dL   High  >562     mg/dL   Very High      Wt Readings from Last 3 Encounters:  02/18/16 74 lb (33.566 kg)  01/29/16 74 lb 15.3 oz (34 kg)  01/07/16 79 lb 12.9 oz (36.2 kg)         ASSESSMENT AND PLAN:  1.  Peripheral arterial disease with critical limb ischemia involving the left lower extremity.   Proceed with revascularization with Dr. Pernell Dupre. Hopefully this is successful, she can have toes amputation instead of below or above the knee amputation. Short term anticoagulation with heparin as well as short-term dual antiplatelet therapy was cleared by Dr. Dutch Quint with the understanding that we have to balance risk and benefits. This was discussed extensively with the patient and her daughter and they are eager to proceed with the procedure for limb salvage. I left a message to Dr. Pernell Dupre in this regard.   2. Chronic atrial fibrillation: Ventricular rate is well controlled. Not a candidate for resumption of anticoagulation given recent subdural hematoma.  3. Severe mitral stenosis: She appears to be asymptomatic likely due to poor functional status.  4. Essential hypertension:  Blood pressure is reasonably controlled.    Disposition:   FU with me in 2 month  Signed,  Lorine Bears, MD  02/18/2016 5:20 PM    [facility]

## 2016-02-18 NOTE — Patient Instructions (Signed)
  PLEASE CALL ME WITH ANY PROBLEMS OR QUESTIONS (#[phone]).      

## 2016-02-18 NOTE — Progress Notes (Signed)
Subjective:    Patient ID: [name] [name], female    DOB: 1945-11-30, 70 y.o.   MRN: [id number]  HPI   Transitional Care Clinic Note  [name] [name] is here in follow up of her TBI and PAD. She has finally been cleared to proceed with surgyer which will probably happen within the next 1-2 weeks. It sounds as if there will be toe amps and re-vascularization.  Her pain is being controlled with tramadol and tylenol which is not meeting her needs right now. She was initially tearful when she came to the office today.  Therapy has done some basic work with her at the facility. She is able to assist more with transfers. Family has noticed cognitive improvements also. No new seizures have been reported.  All meds were reviewed.  Pain Inventory Average Pain 9 Pain Right Now 9 My pain is sharp and burning  In the last 24 hours, has pain interfered with the following? General activity NA Relation with others NA Enjoyment of life NA What TIME of day is your pain at its worst? night Sleep (in general) Poor  Pain is worse with: always  Pain improves with: nothing  Relief from Meds: 0  Mobility walk with assistance ability to climb steps?  no do you drive?  no use a wheelchair needs help with transfers  Function disabled: date disabled NA retired I need assistance with the following:  bathing and toileting  Neuro/Psych trouble walking  Prior Studies Any changes since last visit?  no  Physicians involved in your care Primary care .   Family History  Problem Relation Age of Onset  . Adopted: Yes  . Kidney disease Brother     died from  . Kidney disease Sister     died from  . Heart attack Neg Hx   . Stroke Neg Hx    Social History   Social History  . Marital Status: Legally Separated    Spouse Name: N/A  . Number of Children: N/A  . Years of Education: N/A   Social History Main Topics  . Smoking status: Former Smoker -- 0.25 packs/day for 10 years    Types:  Cigarettes  . Smokeless tobacco: Never Used     Comment: "quit smoking cigarettes in the 1990s"  . Alcohol Use: No  . Drug Use: No  . Sexual Activity: No   Other Topics Concern  . None   Social History Narrative   Past Surgical History  Procedure Laterality Date  . Insert / replace / remove pacemaker    . Kidney transplant  December 1999  . Abdominal angiogram  11/30/2012    ABDOMINAL AORTIC ANGIOGRAM   DR ARDIA  . Dialysis fistula creation    . Abdominal aortagram N/A 11/30/2012    Procedure: ABDOMINAL [name];  Surgeon: [name], MD;  Location: [facility] CATH LAB;  Service: Cardiovascular;  Laterality: N/A;  . Peripheral vascular catheterization N/A 11/20/2015    Procedure: Abdominal Aortogram;  Surgeon: [name], MD;  Location: [facility] INVASIVE CV LAB;  Service: Cardiovascular;  Laterality: N/A;  . Peripheral vascular catheterization Bilateral 11/20/2015    Procedure: Lower Extremity Angiography;  Surgeon: [name], MD;  Location: [facility] INVASIVE CV LAB;  Service: Cardiovascular;  Laterality: Bilateral;  . Peripheral vascular catheterization  11/20/2015    Procedure: Peripheral Vascular Balloon Angioplasty;  Surgeon: [name], MD;  Location: [facility] INVASIVE CV LAB;  Service: Cardiovascular;;  . Tonsillectomy    . Appendectomy    .  Hernia repair    . Umbilical hernia repair     Past Medical History  Diagnosis Date  . Hypertension   . Stroke [facility]) December 2011    left MCA distribution  . Permanent atrial fibrillation (HCC)     coumadin clinic at [facility]. Has previously required Lovenox->Coumadin bridge.  . Severe mitral stenosis by prior echocardiogram     last echo 07/2012  . Aortic stenosis   . Symptomatic bradycardia     s/p pacer  . Depression   . HSV (herpes simplex virus) infection   . Anemia   . Hyperparathyroidism, primary (HCC)     s/p parathyroidectomy  . PAD (peripheral artery disease) (HCC)     a. s/p RLE angioplasty with Dr.  Fredia Sorrow. b. (by [name])  L popliteral artery orbital atherectomy and balloon angioplasty & L SFA orbital atherectomy and balloon angioplasty 11/30/12.  [name] PVD (peripheral vascular disease) (HCC)   . Presence of permanent cardiac pacemaker   . High cholesterol   . CHF (congestive heart failure) (HCC)   . TIA (transient ischemic attack) 08/2009    [name] 09/05/2009  . Atrial thrombus (HCC)     appendage/notes 09/05/2009  . Type II diabetes mellitus (HCC)   . History of blood transfusion     related to "kidney transplant"  . Chronic kidney disease     s/p renal transplant;  follows with [name]  . GERD (gastroesophageal reflux disease)     hx  . History of stomach ulcers   . Diabetic foot ulcer (HCC)     "left is worse than right" (11/20/2015)   BP 147/103 mmHg  Pulse 53  Resp 16  SpO2 96%  Opioid Risk Score:   Fall Risk Score:  `1  Depression screen PHQ 2/9  Depression screen PHQ 2/9 02/18/2016  Decreased Interest 3  Down, Depressed, Hopeless 3  PHQ - 2 Score 6  Altered sleeping 0  Tired, decreased energy 3  Change in appetite 3  Feeling bad or failure about yourself  3  Trouble concentrating 3  Moving slowly or fidgety/restless 3  Suicidal thoughts 0  PHQ-9 Score 21  Difficult doing work/chores Somewhat difficult     Review of Systems     Objective:   Physical Exam  Constitutional: She appears well-developed. She appears cachectic.  HENT:  Head: Normocephalic and atraumatic.  Mouth/Throat: poor dentition. no change Eyes: Conjunctivae and EOM are normal. Pupils are equal, round, and reactive to light.  Neck: Normal range of motion. Neck supple.  Cardiovascular: An irregularly irregular rhythm present.  Murmur heard. Respiratory: Effort normal and breath sounds normal. No respiratory distress. She has no wheezes.  GI: Soft. Bowel sounds are normal. She exhibits no distension. There is no tenderness.  Musculoskeletal: She exhibits  tenderness. She exhibits no edema.  Skin: Left remain somewhat painful to touch--Dry gangrene with atrophy left 2-5th toes.  Right 1st and 2nd toes ischemic appearing also   Neurological: She is alert.language much improved. Attention better. Improved insight and awareness.    Psychiatric: Her affect is bright. Occasionally emotiaonl.      Assessment & Plan:  1. Aphasia, gait disorder, cognitive deficits secondary to Left subdural hematoma, left superior inferior pubic ramus fracture status post fall -continue with plan at SNF  -will need new therapy plan post-op  -she has made noticeable cognitive and behavioral gains 3. Pain Management: trial of hydrocodone to replace tramadol for the short term. -pain should improve post-op 4.  Mood: still labile at time.  5.. Skin/Wound Care:  -local care for nowi. eucerin cream RLE 7. A fib with severe mitral stenosis and symptomatic AS: Monitor for CP or orthostatic symptoms.  9. DM type 2: per SNF  11. CKD s/p renal transplant: ON cyclosporine, prednisone and cellcept.  12. PAD with claudication and gangrenous change left toes:   -toe amps and re-vascularization pending 13. HTN: bp elevated today. ?likely due to pain/anxiety---has been bette controlled at facility 14. New onset seizures--no new seizures since dc: Continue keppra  500mg  bid.  Thirty minutes of face to face patient care time were spent during this visit. All questions were encouraged and answered. Follow up in 2 months.

## 2016-03-07 DEATH — deceased

## 2016-03-19 ENCOUNTER — Telehealth: Payer: Self-pay | Admitting: Cardiovascular Disease

## 2016-03-19 NOTE — Telephone Encounter (Signed)
Lilyan Punt with [facility] # [phone] need a couple of outstanding orders for March 30th (occupational therapy) and April 7th INR check.

## 2016-03-20 NOTE — Telephone Encounter (Signed)
Left message for Lilyan Punt [facility], to fax outstanding orders for Dr. Jari Sportsman signature. Provided CB number if any further questions.

## 2016-03-23 NOTE — Telephone Encounter (Signed)
Faxed information requested to [phone]

## 2016-04-20 ENCOUNTER — Encounter: Payer: Medicaid Other | Attending: Physical Medicine & Rehabilitation | Admitting: Physical Medicine & Rehabilitation

## 2016-04-20 DIAGNOSIS — Z8673 Personal history of transient ischemic attack (TIA), and cerebral infarction without residual deficits: Secondary | ICD-10-CM | POA: Insufficient documentation

## 2016-04-20 DIAGNOSIS — F329 Major depressive disorder, single episode, unspecified: Secondary | ICD-10-CM | POA: Insufficient documentation

## 2016-04-20 DIAGNOSIS — Z8719 Personal history of other diseases of the digestive system: Secondary | ICD-10-CM | POA: Insufficient documentation

## 2016-04-20 DIAGNOSIS — R569 Unspecified convulsions: Secondary | ICD-10-CM | POA: Insufficient documentation

## 2016-04-20 DIAGNOSIS — G8929 Other chronic pain: Secondary | ICD-10-CM | POA: Insufficient documentation

## 2016-04-20 DIAGNOSIS — E1122 Type 2 diabetes mellitus with diabetic chronic kidney disease: Secondary | ICD-10-CM | POA: Insufficient documentation

## 2016-04-20 DIAGNOSIS — S069X0A Unspecified intracranial injury without loss of consciousness, initial encounter: Secondary | ICD-10-CM | POA: Insufficient documentation

## 2016-04-20 DIAGNOSIS — I509 Heart failure, unspecified: Secondary | ICD-10-CM | POA: Insufficient documentation

## 2016-04-20 DIAGNOSIS — Z992 Dependence on renal dialysis: Secondary | ICD-10-CM | POA: Insufficient documentation

## 2016-04-20 DIAGNOSIS — E7800 Pure hypercholesterolemia, unspecified: Secondary | ICD-10-CM | POA: Insufficient documentation

## 2016-04-20 DIAGNOSIS — I6200 Nontraumatic subdural hemorrhage, unspecified: Secondary | ICD-10-CM | POA: Insufficient documentation

## 2016-04-20 DIAGNOSIS — R4189 Other symptoms and signs involving cognitive functions and awareness: Secondary | ICD-10-CM | POA: Insufficient documentation

## 2016-04-20 DIAGNOSIS — I739 Peripheral vascular disease, unspecified: Secondary | ICD-10-CM | POA: Insufficient documentation

## 2016-04-20 DIAGNOSIS — I132 Hypertensive heart and chronic kidney disease with heart failure and with stage 5 chronic kidney disease, or end stage renal disease: Secondary | ICD-10-CM | POA: Insufficient documentation

## 2016-04-20 DIAGNOSIS — Z87891 Personal history of nicotine dependence: Secondary | ICD-10-CM | POA: Insufficient documentation

## 2016-04-20 DIAGNOSIS — R269 Unspecified abnormalities of gait and mobility: Secondary | ICD-10-CM | POA: Insufficient documentation

## 2016-04-20 DIAGNOSIS — N186 End stage renal disease: Secondary | ICD-10-CM | POA: Insufficient documentation

## 2016-04-20 DIAGNOSIS — Z940 Kidney transplant status: Secondary | ICD-10-CM | POA: Insufficient documentation

## 2016-04-20 DIAGNOSIS — I482 Chronic atrial fibrillation: Secondary | ICD-10-CM | POA: Insufficient documentation

## 2016-04-20 DIAGNOSIS — I050 Rheumatic mitral stenosis: Secondary | ICD-10-CM | POA: Insufficient documentation

## 2016-04-20 DIAGNOSIS — K219 Gastro-esophageal reflux disease without esophagitis: Secondary | ICD-10-CM | POA: Insufficient documentation

## 2016-04-28 ENCOUNTER — Ambulatory Visit: Payer: Medicaid Other | Admitting: Cardiovascular Disease

## 2017-08-21 IMAGING — CT CT CERVICAL SPINE W/O CM
3 of 5 series · 11 of 33 positions shown, 13 images · non-contrast
Comparison: Head CT - 11/17/2015 ; 08/18/2009

CLINICAL DATA: Post fall hitting left frontal region of head now
with headache, dizziness and blurred vision. Headache radiating to
the left side of the neck.

EXAM:
CT HEAD WITHOUT CONTRAST
CT CERVICAL SPINE WITHOUT CONTRAST
TECHNIQUE: Multidetector CT imaging of the head and cervical spine was
performed following the standard protocol without intravenous
contrast. Multiplanar CT image reconstructions of the cervical spine
were also generated.

[Series 5: c_spine 2.0 sag bone · sagittal · 0.41mm/px · 5 of 61 slices shown, 6 images]
[im 21/61  bone]
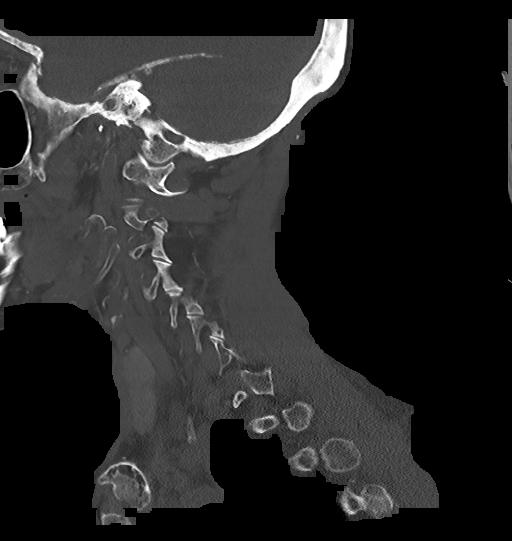
[im 26/61  bone]
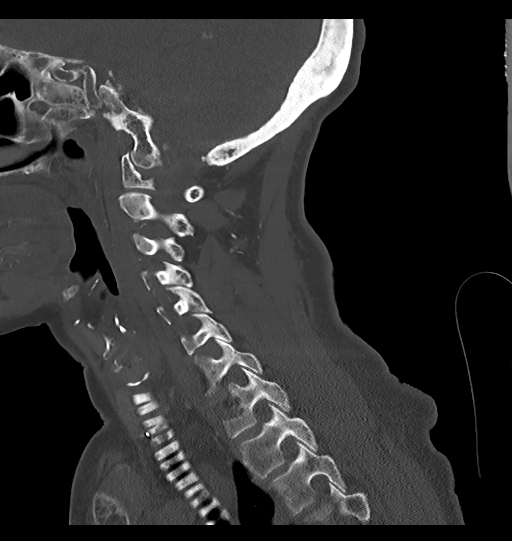
[im 31/61  soft-tissue]
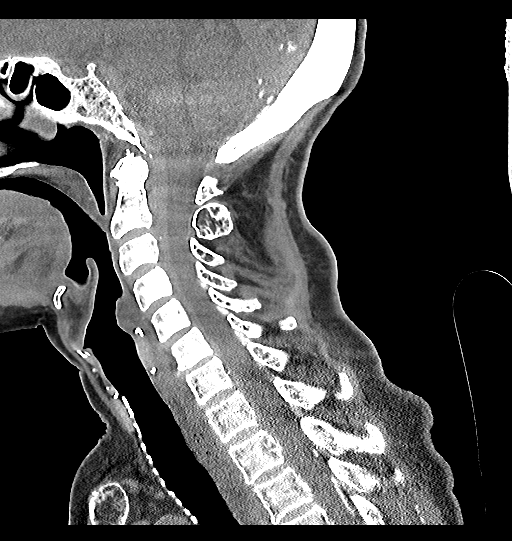
[im 31/61  bone]
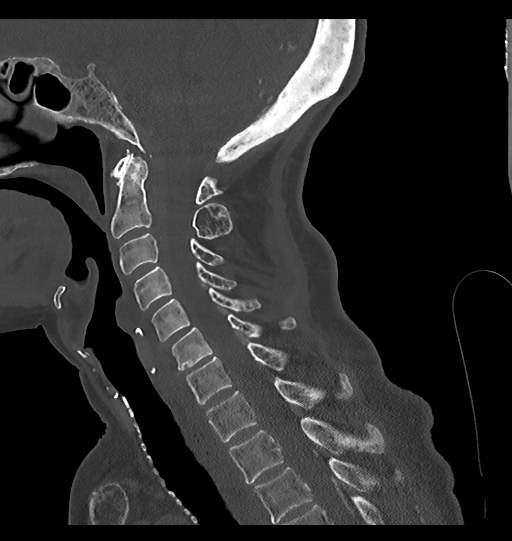
[im 36/61  bone]
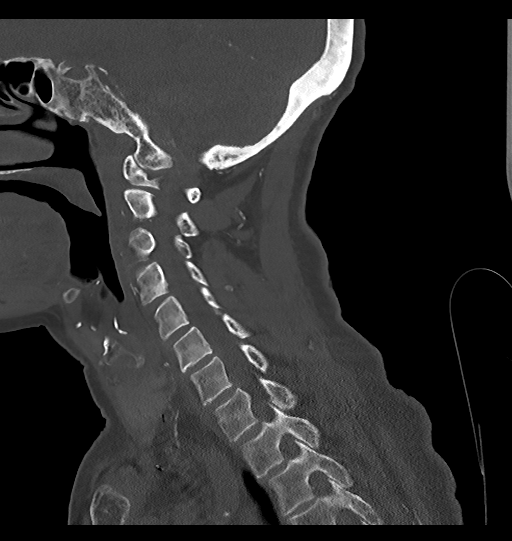
[im 41/61  bone]
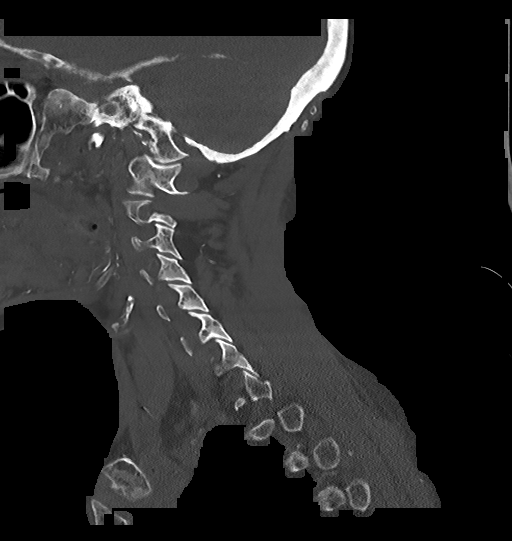

[Series 6: c_spine 2.0 cor bone · coronal · 0.30mm/px · 3 of 52 slices shown]
[im 12/52  bone]
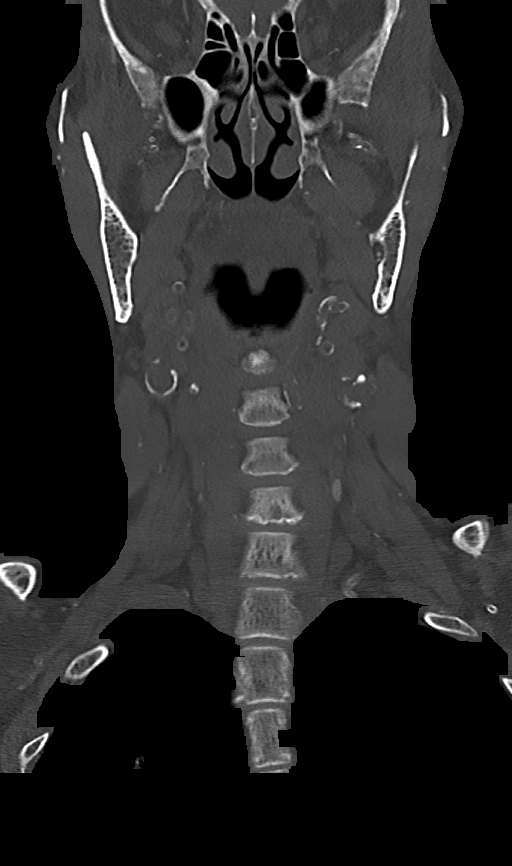
[im 21/52  bone]
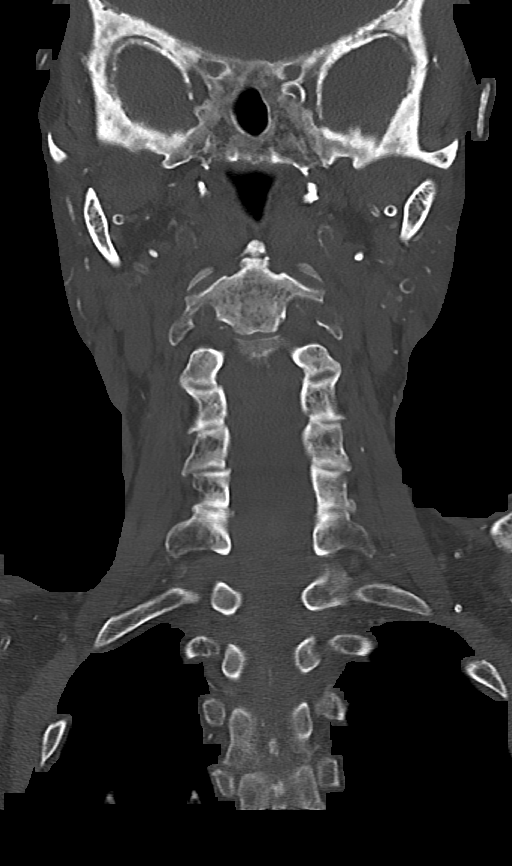
[im 30/52  bone]
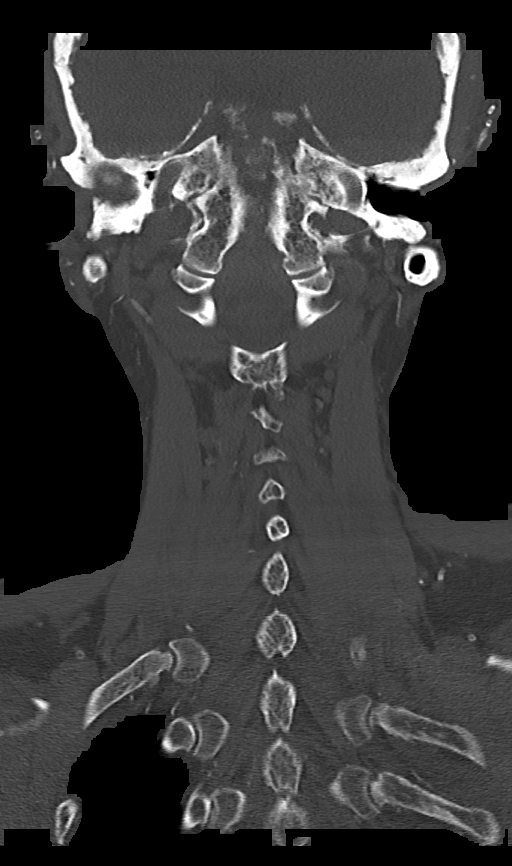

[Series 7: c_spine 2.0 orthogonals · axial · 0.30mm/px · z∈[-288,-199]mm · 3 of 96 slices shown, 4 images]
[im 24/96  soft-tissue]
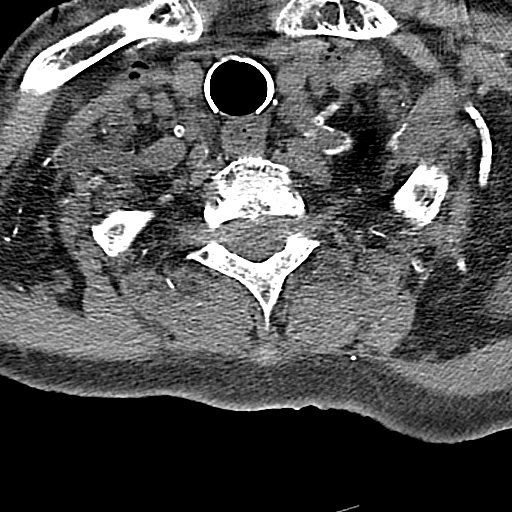
[im 24/96  bone]
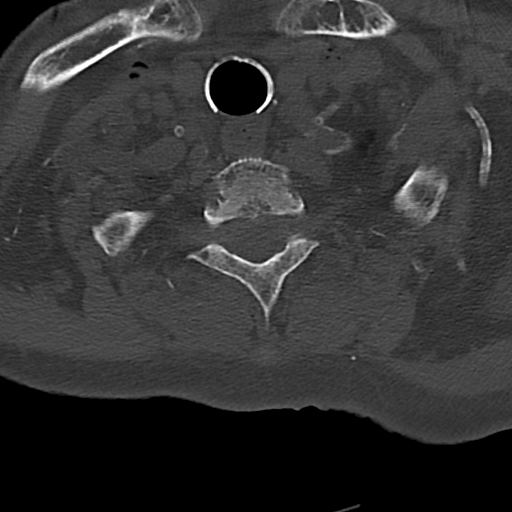
[im 48/96  bone]
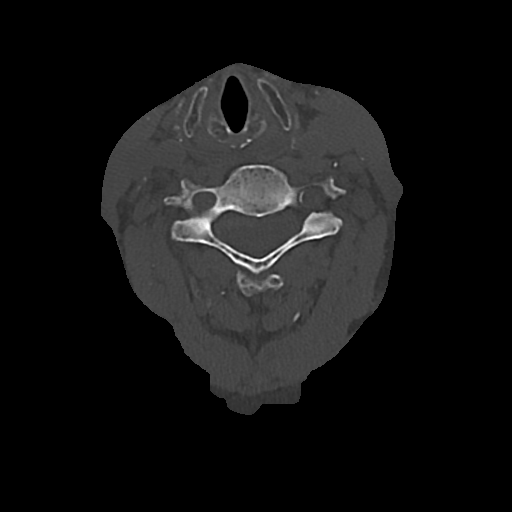
[im 72/96  bone]
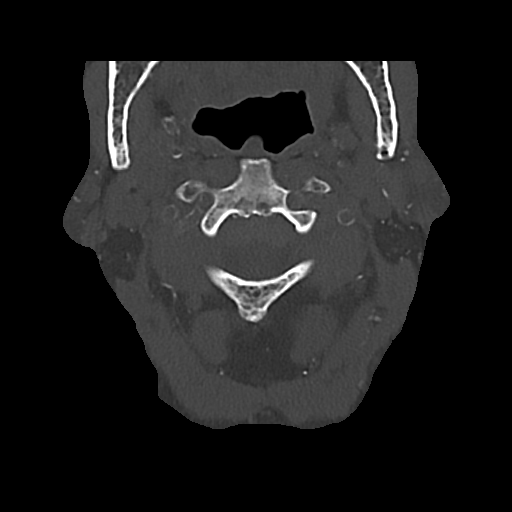

[11 of 33 positions shown; findings below may reference images not displayed]

FINDINGS: CT HEAD FINDINGS

There is a minimal amount of asymmetric soft tissue swelling about
the left parietal calvarium (image 22, series 2). This finding is
without associated displaced calvarial fracture or radiopaque
foreign body.

There is a moderate sized crescentic subdural hematoma about
primarily the left frontoparietal lobe which measures approximately
0.9 cm in greatest diameter (image 19, series 2).

Additionally, there is a small amount of subarachnoid hemorrhage
seen within several sulci about the left parietal lobe
(Representative image 21, series 2). No intraventricular extension.

Minimal regional mass effect with associated approximately 3 mm of
left-to-right midline shift and very minimal effacement of the left
lateral ventricle.

There is a suspected very tiny (approximately 2 mm) subdural
hematoma about the right frontal lobe (representative images 14 and
16, series 2) without associated adjacent mass effect.

There is an old subcortical infarct involving the posterior aspect
of the right parietal lobe (is 22, series 2). No CT evidence of
superimposed acute large territory infarct. No intraparenchymal or
extra-axial mass. No evidence of hydrocephalus.

Extensive intracranial vascular calcifications. Additionally, there
is exuberant vascular calcifications about the small vessels of the
scalp as well as the tentorium and about the dura with mottled mixed
lytic and sclerotic appearance of the calvarium, unchanged and
likely compatible with renal osteodystrophy in the setting of a
patient with known prior renal transplantation.

CT CERVICAL SPINE FINDINGS

C1 to the superior endplate of T4 is imaged.

There is minimal (approximately 2 mm) of anterolisthesis of C5 upon
C6. The bilateral facets are normally aligned. The dens is normally
positioned between the lateral masses of C1. Mild degenerative
change of the atlantodental articulation. Normal atlantoaxial
articulations.

No fracture or static subluxation of the cervical spine.

Cervical vertebral and heights are preserved. Prevertebral soft
tissues are normal.

Mild multilevel cervical spine DDD, worse at C6-C7 with disc space
height loss, endplate irregularity and sclerosis. Calcifications
within the bilateral carotid bulbs, right greater than left.
Extensive small vessel vascular calcifications about the neck and
base of skull. No bulky cervical lymphadenopathy on this noncontrast
examination.

Normal noncontrast appearance of the thyroid gland.

Limited visualization of lung apices demonstrates rather extensive
biapical ill-defined nodular airspace opacities (representative
image 105, series 3). Right anterior chest wall pacemaker.
IMPRESSION: Head CT Impression:

1. Moderate-sized subdural hematoma about the left frontoparietal
lobe measuring approximately 9 mm in diameter and resulting in
approximately 3 mm left-to-right midline shift.
2. Small amount of subarachnoid hemorrhage located within several
adjacent sulci of the left parietal lobe. No intraventricular
extension.
3. Suspected tiny (approximately 2 mm) subdural hematoma about the
contralateral right frontal lobe without associated mass effect
4. Minimal soft tissue swelling about the left parietal calvarium
without associated fracture radiopaque foreign body.
5. Old infarct involving the posterior subcortical aspect of the
right parietal lobe.
Cervical spine CT Impression:

1. No fracture static subluxation of cervical spine.
2. Rather extensive nodular airspace opacities within the imaged
bilateral lung apices, incompletely evaluated. Further evaluation
with nonemergent dedicated chest CT could be performed as clinically
indicated.
3. Stigmata of suspected advanced renal osteodystrophy as detailed
above.

Critical Value/emergent results were called by telephone at the time
of interpretation on 01/07/2016 at [DATE] to Dr. HEARTS ZAFER , who
verbally acknowledged these results.
